# Patient Record
Sex: Female | Born: 1940 | Race: White | Hispanic: No | Marital: Married | Smoking: Never smoker
Health system: Southern US, Community
[De-identification: ages and names within clinical notes are randomized; demographics above are authoritative.]

## PROBLEM LIST (undated history)

## (undated) DIAGNOSIS — E785 Hyperlipidemia, unspecified: Secondary | ICD-10-CM

## (undated) DIAGNOSIS — M1990 Unspecified osteoarthritis, unspecified site: Secondary | ICD-10-CM

## (undated) DIAGNOSIS — E119 Type 2 diabetes mellitus without complications: Secondary | ICD-10-CM

## (undated) DIAGNOSIS — R112 Nausea with vomiting, unspecified: Secondary | ICD-10-CM

## (undated) DIAGNOSIS — A419 Sepsis, unspecified organism: Secondary | ICD-10-CM

## (undated) DIAGNOSIS — I4891 Unspecified atrial fibrillation: Secondary | ICD-10-CM

## (undated) DIAGNOSIS — Z9889 Other specified postprocedural states: Secondary | ICD-10-CM

## (undated) DIAGNOSIS — I10 Essential (primary) hypertension: Secondary | ICD-10-CM

## (undated) DIAGNOSIS — Z87442 Personal history of urinary calculi: Secondary | ICD-10-CM

## (undated) HISTORY — PX: TONSILLECTOMY: SUR1361

## (undated) HISTORY — PX: CHOLECYSTECTOMY: SHX55

## (undated) HISTORY — PX: SALIVARY STONE REMOVAL: SHX5213

## (undated) HISTORY — PX: ABDOMINAL HYSTERECTOMY: SHX81

## (undated) HISTORY — PX: CATARACT EXTRACTION W/ INTRAOCULAR LENS  IMPLANT, BILATERAL: SHX1307

## (undated) HISTORY — PX: BREAST SURGERY: SHX581

---

## 2006-05-17 ENCOUNTER — Inpatient Hospital Stay: Payer: Self-pay | Admitting: Internal Medicine

## 2006-05-17 ENCOUNTER — Other Ambulatory Visit: Payer: Self-pay

## 2006-05-24 ENCOUNTER — Other Ambulatory Visit: Payer: Self-pay

## 2006-07-20 ENCOUNTER — Ambulatory Visit: Payer: Self-pay | Admitting: Vascular Surgery

## 2010-02-01 ENCOUNTER — Emergency Department: Payer: Self-pay | Admitting: Emergency Medicine

## 2010-02-20 ENCOUNTER — Emergency Department: Payer: Self-pay | Admitting: Emergency Medicine

## 2010-06-08 ENCOUNTER — Emergency Department: Payer: Self-pay | Admitting: Emergency Medicine

## 2011-09-19 ENCOUNTER — Ambulatory Visit: Payer: Self-pay | Admitting: Internal Medicine

## 2011-10-18 ENCOUNTER — Emergency Department: Payer: Self-pay | Admitting: Emergency Medicine

## 2011-10-18 LAB — URINALYSIS, COMPLETE
Bacteria: NONE SEEN
Bilirubin,UR: NEGATIVE
Ketone: NEGATIVE
Ph: 7 (ref 4.5–8.0)
Protein: 100
RBC,UR: 206 /HPF (ref 0–5)
Squamous Epithelial: 1
WBC UR: 38 /HPF (ref 0–5)

## 2013-10-06 ENCOUNTER — Inpatient Hospital Stay: Payer: Self-pay | Admitting: Internal Medicine

## 2013-10-06 ENCOUNTER — Ambulatory Visit: Payer: Self-pay | Admitting: Oncology

## 2013-10-06 LAB — COMPREHENSIVE METABOLIC PANEL
Albumin: 3.3 g/dL — ABNORMAL LOW (ref 3.4–5.0)
Alkaline Phosphatase: 100 U/L
Anion Gap: 22 — ABNORMAL HIGH (ref 7–16)
BUN: 31 mg/dL — ABNORMAL HIGH (ref 7–18)
Bilirubin,Total: 0.4 mg/dL (ref 0.2–1.0)
CHLORIDE: 96 mmol/L — AB (ref 98–107)
CREATININE: 1.12 mg/dL (ref 0.60–1.30)
Calcium, Total: 9.5 mg/dL (ref 8.5–10.1)
Co2: 14 mmol/L — ABNORMAL LOW (ref 21–32)
GFR CALC AF AMER: 56 — AB
GFR CALC NON AF AMER: 48 — AB
Glucose: 253 mg/dL — ABNORMAL HIGH (ref 65–99)
Osmolality: 280 (ref 275–301)
Potassium: 3.2 mmol/L — ABNORMAL LOW (ref 3.5–5.1)
SGOT(AST): 20 U/L (ref 15–37)
SGPT (ALT): 19 U/L (ref 12–78)
Sodium: 132 mmol/L — ABNORMAL LOW (ref 136–145)
Total Protein: 8.4 g/dL — ABNORMAL HIGH (ref 6.4–8.2)

## 2013-10-06 LAB — CBC WITH DIFFERENTIAL/PLATELET
BASOS ABS: 0.1 10*3/uL (ref 0.0–0.1)
Basophil %: 0.4 %
EOS PCT: 0 %
Eosinophil #: 0 10*3/uL (ref 0.0–0.7)
HCT: 38.9 % (ref 35.0–47.0)
HGB: 12.2 g/dL (ref 12.0–16.0)
LYMPHS PCT: 9.2 %
Lymphocyte #: 2.6 10*3/uL (ref 1.0–3.6)
MCH: 27.8 pg (ref 26.0–34.0)
MCHC: 31.4 g/dL — AB (ref 32.0–36.0)
MCV: 89 fL (ref 80–100)
MONO ABS: 0.7 x10 3/mm (ref 0.2–0.9)
Monocyte %: 2.6 %
Neutrophil #: 25 10*3/uL — ABNORMAL HIGH (ref 1.4–6.5)
Neutrophil %: 87.8 %
PLATELETS: 575 10*3/uL — AB (ref 150–440)
RBC: 4.38 10*6/uL (ref 3.80–5.20)
RDW: 14 % (ref 11.5–14.5)
WBC: 28.5 10*3/uL — ABNORMAL HIGH (ref 3.6–11.0)

## 2013-10-06 LAB — HEMOGLOBIN A1C: Hemoglobin A1C: 7.8 % — ABNORMAL HIGH (ref 4.2–6.3)

## 2013-10-06 LAB — MAGNESIUM: Magnesium: 1.5 mg/dL — ABNORMAL LOW

## 2013-10-07 LAB — BASIC METABOLIC PANEL
ANION GAP: 16 (ref 7–16)
Anion Gap: 10 (ref 7–16)
BUN: 43 mg/dL — ABNORMAL HIGH (ref 7–18)
BUN: 40 mg/dL — ABNORMAL HIGH (ref 7–18)
Calcium, Total: 8.5 mg/dL (ref 8.5–10.1)
Calcium, Total: 8.3 mg/dL — ABNORMAL LOW (ref 8.5–10.1)
Chloride: 99 mmol/L (ref 98–107)
Chloride: 101 mmol/L (ref 98–107)
Co2: 21 mmol/L (ref 21–32)
Co2: 25 mmol/L (ref 21–32)
Creatinine: 1.44 mg/dL — ABNORMAL HIGH (ref 0.60–1.30)
Creatinine: 1.22 mg/dL (ref 0.60–1.30)
EGFR (African American): 41 — ABNORMAL LOW
EGFR (Non-African Amer.): 36 — ABNORMAL LOW
EGFR (Non-African Amer.): 44 — ABNORMAL LOW
GFR CALC AF AMER: 51 — AB
Glucose: 222 mg/dL — ABNORMAL HIGH (ref 65–99)
Glucose: 194 mg/dL — ABNORMAL HIGH (ref 65–99)
OSMOLALITY: 290 (ref 275–301)
OSMOLALITY: 287 (ref 275–301)
Potassium: 3 mmol/L — ABNORMAL LOW (ref 3.5–5.1)
Potassium: 2.9 mmol/L — ABNORMAL LOW (ref 3.5–5.1)
Sodium: 136 mmol/L (ref 136–145)
Sodium: 136 mmol/L (ref 136–145)

## 2013-10-07 LAB — CBC WITH DIFFERENTIAL/PLATELET
Basophil #: 0 10*3/uL (ref 0.0–0.1)
Basophil %: 0.3 %
EOS PCT: 0.3 %
Eosinophil #: 0 10*3/uL (ref 0.0–0.7)
HCT: 29.4 % — AB (ref 35.0–47.0)
HGB: 9.5 g/dL — ABNORMAL LOW (ref 12.0–16.0)
Lymphocyte #: 2 10*3/uL (ref 1.0–3.6)
Lymphocyte %: 15.7 %
MCH: 28.1 pg (ref 26.0–34.0)
MCHC: 32.2 g/dL (ref 32.0–36.0)
MCV: 87 fL (ref 80–100)
Monocyte #: 0.5 x10 3/mm (ref 0.2–0.9)
Monocyte %: 4.1 %
NEUTROS PCT: 79.6 %
Neutrophil #: 10.3 10*3/uL — ABNORMAL HIGH (ref 1.4–6.5)
Platelet: 370 10*3/uL (ref 150–440)
RBC: 3.37 10*6/uL — ABNORMAL LOW (ref 3.80–5.20)
RDW: 14 % (ref 11.5–14.5)
WBC: 12.9 10*3/uL — AB (ref 3.6–11.0)

## 2013-10-07 LAB — VANCOMYCIN, TROUGH: Vancomycin, Trough: 10 ug/mL (ref 10–20)

## 2013-10-08 LAB — CBC WITH DIFFERENTIAL/PLATELET
Basophil #: 0 10*3/uL (ref 0.0–0.1)
Basophil %: 0.6 %
EOS PCT: 1.5 %
Eosinophil #: 0.1 10*3/uL (ref 0.0–0.7)
HCT: 24.3 % — AB (ref 35.0–47.0)
HGB: 8.1 g/dL — AB (ref 12.0–16.0)
LYMPHS PCT: 37 %
Lymphocyte #: 2.6 10*3/uL (ref 1.0–3.6)
MCH: 29.2 pg (ref 26.0–34.0)
MCHC: 33.4 g/dL (ref 32.0–36.0)
MCV: 87 fL (ref 80–100)
Monocyte #: 0.6 x10 3/mm (ref 0.2–0.9)
Monocyte %: 8.7 %
NEUTROS PCT: 52.2 %
Neutrophil #: 3.7 10*3/uL (ref 1.4–6.5)
Platelet: 301 10*3/uL (ref 150–440)
RBC: 2.77 10*6/uL — AB (ref 3.80–5.20)
RDW: 14.3 % (ref 11.5–14.5)
WBC: 7.1 10*3/uL (ref 3.6–11.0)

## 2013-10-08 LAB — BASIC METABOLIC PANEL
Anion Gap: 7 (ref 7–16)
BUN: 28 mg/dL — ABNORMAL HIGH (ref 7–18)
Calcium, Total: 7.9 mg/dL — ABNORMAL LOW (ref 8.5–10.1)
Chloride: 106 mmol/L (ref 98–107)
Co2: 26 mmol/L (ref 21–32)
Creatinine: 0.89 mg/dL (ref 0.60–1.30)
EGFR (African American): >60
GLUCOSE: 196 mg/dL — AB (ref 65–99)
Osmolality: 288 (ref 275–301)
Potassium: 3.8 mmol/L (ref 3.5–5.1)
SODIUM: 139 mmol/L (ref 136–145)

## 2013-10-09 LAB — VANCOMYCIN, TROUGH: VANCOMYCIN, TROUGH: 9 ug/mL — AB (ref 10–20)

## 2013-10-09 LAB — OCCULT BLOOD X 1 CARD TO LAB, STOOL: OCCULT BLOOD, FECES: POSITIVE

## 2013-10-09 LAB — CLOSTRIDIUM DIFFICILE(ARMC)

## 2013-10-10 LAB — CREATININE, SERUM
CREATININE: 0.97 mg/dL (ref 0.60–1.30)
EGFR (African American): >60
EGFR (Non-African Amer.): 58 — ABNORMAL LOW

## 2013-10-10 LAB — CBC WITH DIFFERENTIAL/PLATELET
BASOS ABS: 0.1 10*3/uL (ref 0.0–0.1)
BASOS PCT: 0.7 %
Eosinophil #: 0.2 10*3/uL (ref 0.0–0.7)
Eosinophil %: 2.2 %
HCT: 26.2 % — AB (ref 35.0–47.0)
HGB: 8.6 g/dL — ABNORMAL LOW (ref 12.0–16.0)
LYMPHS ABS: 3.1 10*3/uL (ref 1.0–3.6)
Lymphocyte %: 43.3 %
MCH: 28.7 pg (ref 26.0–34.0)
MCHC: 32.7 g/dL (ref 32.0–36.0)
MCV: 88 fL (ref 80–100)
MONO ABS: 0.4 x10 3/mm (ref 0.2–0.9)
MONOS PCT: 6.2 %
Neutrophil #: 3.4 10*3/uL (ref 1.4–6.5)
Neutrophil %: 47.6 %
Platelet: 294 10*3/uL (ref 150–440)
RBC: 2.99 10*6/uL — AB (ref 3.80–5.20)
RDW: 13.9 % (ref 11.5–14.5)
WBC: 7.1 10*3/uL (ref 3.6–11.0)

## 2013-10-10 LAB — WOUND CULTURE

## 2013-10-11 LAB — CULTURE, BLOOD (SINGLE)

## 2013-11-05 ENCOUNTER — Ambulatory Visit: Payer: Self-pay | Admitting: Oncology

## 2013-11-20 DIAGNOSIS — K115 Sialolithiasis: Secondary | ICD-10-CM | POA: Insufficient documentation

## 2014-08-29 NOTE — H&P (Signed)
PATIENT NAME:  Tiffany Manning, Tiffany Manning MR#:  [id number] DATE OF BIRTH:  1941/01/19  DATE OF ADMISSION:  10/06/2013  PRIMARY CARE PHYSICIAN: Dr. Dewaine Oats  REFERRING PHYSICIAN: Dr. Bayard Males   CHIEF COMPLAINT: Right-sided neck pain.   HISTORY OF PRESENT ILLNESS: Tiffany Manning is a 74 year old pleasant white female with past medical history of hypertension, hyperlipidemia, diabetes mellitus. Had 2 lower teeth. Started to notice swelling of the face. Concerning this went to her dentist who felt this was coming from her teeth. The patient was referred to oral surgeon who gave her antibiotics, amoxicillin. The patient took for a week and the swelling did not improve. Concerning this went back to her oral surgeon whole extracted both her lower teeth and gave her 3 more weeks of amoxicillin. The patient continues to have swelling, increase in duration of the submandibular area. The patient was experiencing severe pain, was taking Tylenol every 3 hours. She did not spike any fever. As her symptoms were now getting worse, she came to the Emergency Department. On work-up in the Emergency Department, the patient was found to have WBC count of 21,000. CT neck was done which showed 6 x 6 x 6 cm of large abscess between the submandibular and cricoid area. Consulted ENT, Dr. Chestine Spore, who did the I and D of the abscess. The patient received 1 dose of Zosyn. The patient was recently treated with amoxicillin. The patient states had a small rash on her feet after taking for 10 days. The patient states has tolerated penicillins all her life without any problems. Denies having any difficulty swallowing. There was an incidental finding of 11 mm nodule in the left upper lobe. However, commented as worrisome for primary lung malignancy. The patient has never smoked.   PAST MEDICAL HISTORY: 1.  Hypertension.  2.  Diabetes mellitus. 3.  Hyperlipidemia.  4.  Recurrent urinary tract infections.   PAST SURGICAL HISTORY: Hysterectomy.    ALLERGIES:  1.  SULFA. 2.  DARVON.  3.  AMOXICILLIN caused small Rash, however, the patient tolerated penicillins well in the past.   HOME MEDICATIONS: 1.  Simvastatin 40 mg once a day.  2.  Triamterene/hydrochlorothiazide 1 capsule once a day.  3.  Glipizide/metformin 5/500 mg 1 tablet 2 times a day.  4.  Doxycycline 1 tablet 2 times a day.  5.  Atenolol 50 mg once a day.   SOCIAL HISTORY: The patient never smoked, drank alcohol, or abused illicit drugs. Married. Lives with her husband.   FAMILY HISTORY: Diabetes mellitus.   REVIEW OF SYSTEMS: CONSTITUTIONAL: Has been experiencing generalized weakness.  EYES: No change in vision.  ENT: Has swelling of the neck.  RESPIRATORY: No cough, shortness of breath.  GASTROINTESTINAL: Has been nauseated and vomited. Denies any diarrhea.  GENITOURINARY: No dysuria or hematuria.  SKIN: No rash or lesions.  ENDOCRINE: Has diagnosis of diabetes mellitus.  HEMATOLOGIC: No easy bruising or bleeding.  MUSCULOSKELETAL: No joint pains or aches.  NEUROLOGIC: No weakness or numbness in any part of the body.   PHYSICAL EXAMINATION: GENERAL: This is a well-built, well-nourished age-appropriate female lying down in the bed, not in distress.  VITAL SIGNS: Temperature 98.4, pulse 83, blood pressure 119/57, respiratory rate 18, oxygen saturation 100% on room air.  HEENT: Head normocephalic, atraumatic. There is no scleral icterus. Conjunctivae normal. Pupils equal and react to light. Extraocular movements are intact. Mucous membranes dry. No pharyngeal erythema.  NECK: Has large indurated abscess in the submandibular area. Could not appreciate any  lymphadenopathy on either side. No carotid bruit. CHEST: Has no focal tenderness. Lungs bilaterally clear to auscultation.  HEART: S1, S2 regular. No murmurs are heard.  ABDOMEN: Bowel sounds present. Soft, nontender, nondistended. No hepatosplenomegaly.  EXTREMITIES: No pedal edema. Pulses 2+.  SKIN: No  rash or lesions.  MUSCULOSKELETAL: No joint pains and aches.  NEUROLOGIC: The patient is alert and oriented to place, person, and time. Cranial nerves II through XII intact. Motor 5/5 in upper and lower extremities.   DIAGNOSTIC DATA: Laboratory: CBC: WBC 28,000, hemoglobin 12, platelet count 575,000,  neutrophils 87%.   CMP: Glucose 253, BUN 51, creatinine 1.12, potassium 3.2, sodium 132, and bicarb 14.   ASSESSMENT AND PLAN: Tiffany Manning is a 74 year old female who comes with sepsis secondary to large neck abscess.  1.  Sepsis. Follow up with blood cultures as well as cultures from the incision and drainage. The patient is currently on vancomycin and Zosyn. De-escalate the antibiotics once culture data is available.  2.  Neck abscess, most likely from the dental infection. The abscess has been incised and drained.  Continue the vancomycin and Zosyn. Follow up with the cultures.  3.  Diabetes mellitus. We will hold the metformin for now as the patient received contrast for CT. Continue the glipizide and sliding scale insulin.  4.  Hypertension. Hold hydrochlorothiazide as the patient is dehydrated. Continue the lisinopril.  5.  Dehydration. Continue with IV fluids secondary to nausea and vomiting.  6.  Hypokalemia. Will replace by mouth.  7.  Hyponatremia, most likely secondary to nausea and vomiting. We will continue to follow up after the patient is  given IV fluids.  8.  Keep the patient on deep vein thrombosis prophylaxis with Lovenox.   TIME SPENT: 55 minutes.  ____________________________ Tiffany GriffinsPadmaja Corinn Stoltzfus, Tiffany Manning pv:sb D: 10/06/2013 07:02:17 ET T: 10/06/2013 08:21:19 ET JOB#: [id number]  cc: Tiffany GriffinsPadmaja Lukis Bunt, Tiffany Manning, <Dictator> Jillene Bucksenny C. Arlana Pouchate, Tiffany Manning Clerance LavPADMAJA Osborne Serio Tiffany Manning ELECTRONICALLY SIGNED 10/06/2013 23:24

## 2014-08-29 NOTE — Consult Note (Signed)
Pt seen and examined. Full consult to follow. Pt admitted with large neck abscess along with bouts of nausea, vomiting, and some dysphagia. After abscess drained, nauea/vomiting, and dysphagia resolved. Diarrhea x 1 day. Normally, BM's regular. With hydration, drop in hgb. Stool neg for c.diff but positive for blood. 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 No prior colonoscopy. Overall feels ok. Pt hoping to be discharged today. Ok for discharge from GI point of view unless there is active bleeding, which patient does not have. Have patient f/u in our office. WIll need to schedule outpt colonoscopy and possibly repeat EGD as well. WIll sign off. Thanks.  Electronic Signatures: Lutricia Feilh, Kaide Gage (MD)  (Signed on 05-Jun-15 07:59)  Authored  Last Updated: 05-Jun-15 07:59 by Lutricia Feilh, Christionna Poland (MD)

## 2014-08-29 NOTE — Consult Note (Signed)
History of Present Illness:  Reason for Consult 11 mm lesion incidentally found on CT scan.   HPI   Patient is a 74 year old female who was admitted to the hospital a chief complaint of right-sided neck pain. Subsequent workup which included a CT scan of the neck revealed a 6 x 6 x 6 cm abscess just near her submandibular. Dr. Carlis Abbott did an I&D of the abscess now patient is currently receiving antibiotics. Incidentally there was an 11 mm nodule found in the left upper lobe of her lung. Currently, patient continues to feel terrible but is improved since admission. She has no neurologic complaints. She denies any fevers. She denies any chest pain, cough, shortness of breath, or hemoptysis. She has no nausea, vomiting, constipation, or diarrhea. She has no melena or hematochezia. She has no urinary complaints. Patient otherwise feels well and offers no further specific complaints.  PFSH:  Additional Past Medical and Surgical History hypertension, diabetes, hyperlipidemia, recurrent UTI, total hysterectomy.  Family history: Diabetes.  Social history: Patient is a never smoker, denies alcohol.   Review of Systems:  Performance Status (ECOG) 0   Review of Systems   As per HPI. Otherwise, 10 point system review was negative.   NURSING NOTES: **Vital Signs.:   02-Jun-15 16:04   Vital Signs Type: Q 8hr   Temperature Temperature (F): 98.6   Celsius: 37   Temperature Source: oral   Pulse Pulse: 67   Respirations Respirations: 18   Systolic BP Systolic BP: 573   Diastolic BP (mmHg) Diastolic BP (mmHg): 71   Mean BP: 87   Pulse Ox % Pulse Ox %: 97   Pulse Ox Activity Level: At rest   Oxygen Delivery: Room Air/ 21 %   Telemetry pattern Cardiac Rhythm: Normal sinus rhythm; pattern reported by Telemetry Clerk   Physical Exam:  Physical Exam General: Well-developed, well-nourished, no acute distress. Eyes: Pink conjunctiva, anicteric sclera. HEENT: surgical dressing  noted. Lungs: Clear to auscultation bilaterally. Heart: Regular rate and rhythm. No rubs, murmurs, or gallops. Abdomen: Soft, nontender, nondistended. No organomegaly noted, normoactive bowel sounds. Musculoskeletal: No edema, cyanosis, or clubbing. Neuro: Alert, answering all questions appropriately. Cranial nerves grossly intact. Skin: No rashes or petechiae noted. Psych: Normal affect.    Sulfa drugs: N/V  Darvon: N/V  Amoxicillin: Rash    glipiZIDE-metFORMIN 5 mg-500 mg oral tablet: 1 tab(s) orally 2 times a day, Status: Active, Quantity: 0, Refills: None   hydrochlorothiazide-triamterene 25 mg-37.5 mg oral capsule: 1 cap(s) orally once a day, Status: Active, Quantity: 0, Refills: None   atenolol 50 mg oral tablet: 1 tab(s) orally once a day, Status: Active, Quantity: 0, Refills: None   simvastatin 40 mg oral tablet: 1 tab(s) orally once a day (at bedtime), Status: Active, Quantity: 0, Refills: None   doxycycline hyclate hyclate 100 mg oral delayed release tablet: 1 tab(s) orally 2 times a day, Status: Active, Quantity: 0, Refills: None  Laboratory Results: Routine Chem:  02-Jun-15 04:56   Glucose, Serum  222  BUN  43  Creatinine (comp)  1.44  Sodium, Serum 136  Potassium, Serum  3.0  Chloride, Serum 99  CO2, Serum 21  Calcium (Total), Serum 8.5  Anion Gap 16  Osmolality (calc) 290  eGFR (African American)  41  eGFR (Non-African American)  36 (eGFR values <59m/min/1.73 m2 may be an indication of chronic kidney disease (CKD). Calculated eGFR is useful in patients with stable renal function. The eGFR calculation will not be reliable in acutely ill  patients when serum creatinine is changing rapidly. It is not useful in  patients on dialysis. The eGFR calculation may not be applicable to patients at the low and high extremes of body sizes, pregnant women, and vegetarians.)  Routine Hem:  02-Jun-15 04:56   WBC (CBC)  12.9  RBC (CBC)  3.37  Hemoglobin (CBC)  9.5   Hematocrit (CBC)  29.4  Platelet Count (CBC) 370  MCV 87  MCH 28.1  MCHC 32.2  RDW 14.0  Neutrophil % 79.6  Lymphocyte % 15.7  Monocyte % 4.1  Eosinophil % 0.3  Basophil % 0.3  Neutrophil #  10.3  Lymphocyte # 2.0  Monocyte # 0.5  Eosinophil # 0.0  Basophil # 0.0 (Result(s) reported on 07 Oct 2013 at 05:34AM.)   Assessment and Plan: Impression:   11 mm lesion incidentally found on CT scan. Plan:   1. Incidental lung lesion: Given a nonsmoker, this is unlikely a malignancy but needs to be monitored closely. No intervention is needed at this time. Plan to schedule patient for a repeat CT scan in 3 months to assess for interval change. She will then followup in the [facility] 1-2 days later for discussion of her results. Patient and her husband expressed understanding and were in agreement with this plan.  Electronic Signatures: Delight Hoh (MD)  (Signed 02-Jun-15 17:31)  Authored: HISTORY OF PRESENT ILLNESS, PFSH, ROS, NURSING NOTES, PE, ALLERGIES, HOME MEDICATIONS, LABS, ASSESSMENT AND PLAN   Last Updated: 02-Jun-15 17:31 by Delight Hoh (MD)

## 2014-08-29 NOTE — Discharge Summary (Signed)
PATIENT NAME:  Tiffany Manning, RAMSTAD MR#:  [id number] DATE OF BIRTH:  February 28, 1941  DATE OF ADMISSION:  10/06/2013 DATE OF DISCHARGE:  10/10/2013  ADMITTING DIAGNOSES:   1.  Sepsis.  2.  Neck abscess.  3.  Diabetes mellitus.  4.  Hypertension.  5.  Dehydration.  6.  Hypokalemia.  7.  Hyponatremia.  DISCHARGE DIAGNOSES:   1.  Systemic inflammatory response syndrome. 2.  Right submandibular gland duct stone with resulting right neck abscess, status post incision and drainage of abscess by Dr. Chestine Spore on 10/06/2013.  3.  Hyponatremia.  4.  Hypokalemia.  5.  Dehydration.  6.  Acute renal failure.  7.  Hypotension, resolved with intravenous fluid administration.  8.  History of diabetes mellitus, with hemoglobin A1c 7.8.  9.  Diarrhea, resolved.  10.  Anemia, with rehydration guaiac-positive, EGD as well as colonoscopy to be performed as outpatient by Dr. Bluford Kaufmann.  11.  An 11 mm left upper lobe lobulated mass concerning for primary lung malignancy.  Work-up to be continued and three month follow-up CT scan of the chest is recommended.  DISCHARGE CONDITION:  Stable.   DISCHARGE MEDICATIONS:  The patient is to continue:  1.  Glipizide, metformin 5/500, 1 tablet twice daily.  2.  Hydrochlorothiazide/triamterene 25/37.5 mg once daily.  3.  Atenolol 50 mg by mouth daily.  4.  Simvastatin 40 mg by mouth at bedtime.  5.  Doxycycline 100 mg twice daily for 10 days.  6.  Senna 1 tablet twice daily as needed.  7.  Norco 325/5 mg 1 tablet every four hours as needed.  8.  Promethazine 25 mg every six hours as needed.  9.  Magnesium oxide 400 mg by mouth once daily.  10.  Lactobacillus acidophilus 1 capsule twice daily.   HOME OXYGEN:  None.   DIET:  2 gram low-salt, low-fat, low-cholesterol, carbohydrate-controlled diet, mechanical soft.   ACTIVITY LIMITATIONS:  As tolerated.   FOLLOWUP APPOINTMENT:  With Dr. Demetrios Loll at [facility] in one week after discharge.    CONSULTANTS:  Dr. Chestine Spore, Dr. Bluford Kaufmann, care  management, social work, Dr. Orlie Dakin.     HOSPITAL COURSE:  The patient is a 74 year old Caucasian female with past medical history significant for history of diabetes, hypertension, hyperlipidemia, who presents to the hospital with complaints of right-sided neck pains.  Please refer to Dr. Clarita Leber admission note on 10/06/2013.  On arrival to the hospital the patient was complaining of significant pain.  She had no fevers.  She was apparently referred to oral surgeon who extracted both lower teeth and continued the patient on antibiotic therapy.  On arrival to the Emergency Room, she was noted to have elevated white blood cell count and hospitalist services were contacted, but also ENT physician Dr. Chestine Spore was consulted.  The patient had a CT scan of the neck done on 10/06/2013 which revealed a large complex cystic mass in the right neck extending from the low right submandibular gland to the level of cricoid cartilage.  This measures 5.9 cm in greatest dimension.  It did not appear to have a dental origin, although a large dental abscess was possible.  It did have an inflammatory appearance.  It may be an abscess developing from the right submandibular gland infection.  There is a stone noted in the inferior medial aspect of right submandibular gland which may reside in the submandibular duct.  Large complex cystic mass could be neoplastic origin according to radiologist as well.  No enlarged neck lymph  nodes were noted.  An 11 mm left upper lobe lobulated mass worrisome for primary lung malignancy was noted.  Small nodule in the right upper lobe may be benign, but could reflect metastatic disease.  Consider follow-up CT scan according to radiologist.  Because of these abnormalities, the patient was admitted to the hospital, started on broad-spectrum antibiotic therapy.  Consultation with Dr. Gertie BaronMadison Clark was obtained on 10/06/2013.  The patient was seen by Dr. Chestine Sporelark and underwent incision and drainage of  right neck abscess.  A 10 blade was used to make an incision in the skin, copious pus was evacuated from the wound and samples with cultures swabs for aerobic as well as anaerobic bacterial culture.  The wound was copiously irrigated with saline approximately 500 mL until all of the accessible purulence was completely removed.  The patient was given additional supplemental 4 mg of morphine for pain control and the wound was packed with Betadine soaked half inch of iodoform gauze.  The patient placed the dressing of 4 x 4 and loose Kerlix.  The patient tolerated the procedure well and felt immediate relief from the procedure itself.  The patient has been followed by Dr. Chestine Sporelark while she was in the hospital.  Dr. Chestine Sporelark saw the patient in consultation last time on the day of discharge, 10/10/2013.  A small amount of dried crust was noted in the I and D site, but no palpable reaccumulation of the fluid.  He discussed the case [facility]  He kindly agreed [facility] and arrange for surgical excission of the right submandibular gland.  The patient was recommended to continue on antibiotics until surgery to remove right submandibular gland.  This was discussed with the patient at length and she was agreeable.  The patient is being discharged to home today on 10/10/2013 in stable condition.    Her vital signs on the day of discharge, temperature was 98.7, pulse was 65, respiration rate was 18, blood pressure ranging from 160 to 186, this was before medication use and diastolic blood pressure was 80s to 90s, O2 sats were 96% to 97% on room air at rest.   In regards to right upper lobe mass, the patient was consulted by Dr. Orlie DakinFinnegan, oncologist, who felt that the patient's incidental lung lesion, given the patient is a nonsmoker, is unlikely malignancy.  However, this needs to be monitored closely, however no intervention was recommended at this time.  He  recommended to schedule the patient for repeated CT scan in three months.  This is for interval change and then follow up with [facility] in the next 1 to 2 days after discharge to discuss her results.   In regards to hyponatremia as well as hypokalemia and dehydration which the patient was noted to be during arrival to the hospital, those resolved with IV fluid administration.  The patient was slowly rehydrated and her sodium level, potassium level as well as magnesium level which was found to be low were replenished and normalized.  The patient was recommended to resume her blood pressure medications upon discharge.  Initially while in the hospital, the patient's blood pressure medications were placed on hold due to hypotension.   In regards to diabetes mellitus, hemoglobin A1c was checked, was found to be 7.8.  The patient was recommended to continue her outpatient management.  The patient was noted to have diarrhea.  It was felt that the patient's  diarrhea could have been related to magnesium oxide she was given orally, however C. diff stool was checked since the patient has been on antibiotic therapy for a prolonged period of time and C. diff was negative.  The patient was given Lactobacillus acidophilus to take home and follow up with primary care physician.   The patient was noted to be guaiac-positive while in the hospital.  She was evaluated and recommended to undergo colonoscopy as well as EGD as outpatient.  The patient is to follow up with Dr. Bluford Kaufmann in the next one week after discharge for further recommendations and possibly colonoscopy as well as EGD whenever she heals from her right neck area abscess.  The patient is being discharged in stable condition with the above-mentioned medications and follow-up.   TIME SPENT:  40 minutes.    ____________________________ Katharina Caper, MD rv:ea D: 10/10/2013 16:43:42 ET T: 10/10/2013 22:56:10 ET JOB#: [id number]  cc: Katharina Caper, MD,  <Dictator> Dr. Demetrios Loll Vy Badley MD ELECTRONICALLY SIGNED 10/29/2013 14:42

## 2014-08-29 NOTE — Consult Note (Signed)
PATIENT NAME:  Tiffany Manning, Tiffany Manning MR#:  [id number] DATE OF BIRTH:  April 06, 1941  DATE OF ADMISSION: 10/06/2013  DATE OF CONSULTATION:  10/10/2013  CONSULTING PHYSICIAN:  Ezzard StandingPaul Y. Tatjana Turcott, MD  REASON FOR REFERRAL: Drop in hemoglobin, heme-positive stool.    DESCRIPTION: The patient is a 74 year old white female who has a history of hypertension, hyperlipidemia and diabetes, who presented to the hospital on June 1st with significant right-sided neck pain and swelling as well as some bouts of nausea and vomiting. In the Emergency Room, she had a white count of 21,000, and CT scan of the neck showed a 6 x 6 x 6 cm abscess in the right submandibular area. ENT was consulted, who did an I and D of the abscess. The patient was also given some antibiotics as well. After the patient's abscess was drained, the nausea, vomiting and some dysphagia that she was experiencing due to the abscess resolved.   I was asked to see the patient due to 1-day history of diarrhea, drop in hemoglobin with hydration. Stool was negative for C. difficile. However, it did have evidence of blood in the stool. The patient denies seeing any gross hematochezia or melena. Prior to the episode of diarrhea, she had regular bowel movements. She denies having currently any abdominal pain or cramping. There is no nausea, vomiting, heartburn or indigestion at this time.   The patient had an upper endoscopy in January 2008 with duodenal ulcer with visible vessel that was treated. The patient denied ever having a colonoscopy. There is no family history of colon cancer, polyps or colitis.   OTHER PAST MEDICAL HISTORY: Includes diabetes, hypertension, hyperlipidemia and recurrent urinary tract infections.   PAST SURGICAL HISTORY: Includes hysterectomy.   ALLERGIES: SULFA, AMOXICILLIN AND DARVON.   HOME MEDICATIONS: Include Zocor, triamterene/hydrochlorothiazide, glipizide/metformin, doxycycline and atenolol.   SOCIAL HISTORY: There is no smoking of  tobacco.   FAMILY HISTORY: Diabetes.   REVIEW OF SYMPTOMS: There was some generalized weakness, but no real fevers or chills. The swelling in the neck has gone down significantly. There is no chest pain, palpitations, coughing or shortness of breath. She did have some nausea and vomiting which have since stopped. Her diarrhea only started in the hospital. The rest of the review of symptoms is negative.   PHYSICAL EXAMINATION:  GENERAL: The patient is in no acute distress right now.  VITAL SIGNS: She is afebrile. Her vital signs are stable.  HEAD AND NECK: Showed normocephalic, atraumatic head. Pupils are equally reactive. Throat was clear. Neck was supple. Much of the swelling on her right neck has pretty much resolved.  CARDIAC: Revealed regular rhythm and rate.  LUNGS: Clear bilaterally.  ABDOMEN: Showed normoactive bowel sounds, soft and nontender. She had active bowel sounds. There is no hepatomegaly.  EXTREMITIES: Show no clubbing, cyanosis or edema.  NEUROLOGIC: Negative.  SKIN: Negative.   LABORATORY DATA: In terms of labs, white count was 12.9, hemoglobin 9.5 on June 2nd. On June 3rd, white count was normal at 7.1, but hemoglobin dropped to 8.1. C. difficile was negative. Stool was heme-positive.   ASSESSMENT: This is a patient with right neck abscess, who is on antibiotics, which may have contributed to some diarrhea. The stool was negative. The hemoglobin has dropped to 8.1, probably related to hydration. Overall, the patient is doing well.   PLAN: Will see what her hemoglobin is today. If hemoglobin is stable, the patient would like to go home today. I would recommend that she have an outpatient  colonoscopy since there is heme-positive stool and there is no prior colonoscopy. The patient can follow up with [place] in the office as a followup. Will schedule a colonoscopy then. Because of history of duodenal ulcer, we may consider doing an upper endoscopy at the same time as the colonoscopy  to make sure there is no bleeding from upper GI tract. Will sign off at this time.   Thank you for the referral.    ____________________________ Ezzard Standing. Bluford Kaufmann, MD pyo:lb D: 10/10/2013 08:36:19 ET T: 10/10/2013 08:56:01 ET JOB#: [id number]  cc: Ezzard Standing. Bluford Kaufmann, MD, <Dictator> Ezzard Standing Colvin Blatt MD ELECTRONICALLY SIGNED 10/12/2013 17:15

## 2014-08-29 NOTE — Consult Note (Signed)
PATIENT NAME:  Tiffany Manning, Tiffany Manning MR#:  [id number] DATE OF BIRTH:  04-Jun-1940  DATE OF CONSULTATION:  10/06/2013  REFERRING PHYSICIAN: Bayard Malesandolph Brown, MD CONSULTING PHYSICIAN:  Tiffany BarefootJ. Madison Okley Magnussen, MD  REASON FOR CONSULTATION: Right neck abscess.   HISTORY OF PRESENT ILLNESS: The patient is a 74 year old white female, who presents to the Emergency Room with severe right neck pain that has gone for about a month. She began developing the right neck pain and was seen about a month ago by her dentist and an oral surgeon in [place]. She was also evaluated by Dr. Jenne CampusMcQueen and he elected to place her on amoxicillin for 3 weeks. She did not notice significant improvement and was then seen again by the oral surgeon. The oral surgeon then removed her mandibular central incisors, again with no significant relief. The right neck pain has progressively gotten worse over the past week. Her husband has tried to "stick a needle in it." A CT scan was obtained, which shows a large right neck abscess. Notably, there is also a left upper lobe mass that was identified on the neck CT.   ALLERGIES, MEDICATIONS, PAST MEDICAL HISTORY, AND PAST SURGICAL HISTORY: Reviewed and documented in the chart.   PHYSICAL EXAMINATION:  GENERAL: The patient is in notable pain, but no respiratory distress.  HEENT: There is an obvious large right neck abscess in the submandibular space. Oral cavity and oropharynx: Recent surgical changes in the central lower gingiva. No floor of mouth abscess or cellulitis. Ears: Hearing aid in the left ear. External auditory canals are clear. Nose: The nares are patent. No intranasal mucosa  inflammation or nasal purulence.  NECK: Large right submandibular space abscess. The submandibular gland itself is not palpable. There is overlying erythema in the skin with a central crust (corresponding to where her husband had attempted drainage). There is shotty adenopathy in levels 3 and 4. The trachea is midline.    PROCEDURE: Incision and drainage right neck abscess.   DESCRIPTION OF PROCEDURE: The area was locally anesthetized, prepped and draped in the usual fashion with Betadine. A 10 blade was used to make an incision in the skin. Copious pus was evacuated from the wound and sampled with culture swabs for aerobic and anaerobic bacterial culture. The wound was copiously irrigated with saline (500 mL) until all of the expressible purulence had been completely removed. The patient was given did an additional supplemental 4 mg of morphine for pain control and then the wound was packed with Betadine soaked half inch iodoform gauze. The nurse placed the dressing of 4 x 4's and loose Kerlix. The patient tolerated the procedure well and felt immediate relief from the pressure.   IMPRESSION: Right neck abscess.   RECOMMENDATIONS: The patient will be placed on broad-spectrum antibiotics and admitted to the hospital by the hospitalist service (very much appreciate that) and she will have further followup for the left upper lobe lung mass. The iodoform gauze will be removed slowly over the next couple of days by either Dr. Jenne CampusMcQueen or myself.   (addendum) I communicated with Dr. Jenne CampusMcQueen to provide details of the encounter and the patient's location.  ____________________________ Tiffany CommonsJ. Gertie BaronMadison Siya Flurry, MD jmc:aw D: 10/06/2013 08:01:26 ET T: 10/06/2013 08:16:33 ET JOB#: [id number]  cc: Tiffany BarefootJ. Madison Brinley Rosete, MD, <Dictator> Davina Pokehapman T. McQueen, MD [facility] - Practice Administrator Wendee CoppJMADISON Sargon Scouten MD ELECTRONICALLY SIGNED 10/09/2013 7:28

## 2017-03-07 ENCOUNTER — Ambulatory Visit: Payer: Self-pay | Admitting: Orthopaedic Surgery

## 2017-05-28 ENCOUNTER — Emergency Department: Payer: Medicare HMO

## 2017-05-28 ENCOUNTER — Other Ambulatory Visit: Payer: Self-pay

## 2017-05-28 ENCOUNTER — Encounter: Payer: Self-pay | Admitting: Emergency Medicine

## 2017-05-28 ENCOUNTER — Inpatient Hospital Stay
Admission: EM | Admit: 2017-05-28 | Discharge: 2017-06-06 | DRG: 682 | Disposition: A | Discharge status: Home / Self Care | Payer: Medicare HMO | Attending: Internal Medicine | Admitting: Internal Medicine

## 2017-05-28 DIAGNOSIS — E43 Unspecified severe protein-calorie malnutrition: Secondary | ICD-10-CM

## 2017-05-28 DIAGNOSIS — E876 Hypokalemia: Secondary | ICD-10-CM | POA: Diagnosis present

## 2017-05-28 DIAGNOSIS — Z79899 Other long term (current) drug therapy: Secondary | ICD-10-CM

## 2017-05-28 DIAGNOSIS — D649 Anemia, unspecified: Secondary | ICD-10-CM | POA: Diagnosis present

## 2017-05-28 DIAGNOSIS — E872 Acidosis, unspecified: Secondary | ICD-10-CM

## 2017-05-28 DIAGNOSIS — D72829 Elevated white blood cell count, unspecified: Secondary | ICD-10-CM | POA: Diagnosis present

## 2017-05-28 DIAGNOSIS — N179 Acute kidney failure, unspecified: Principal | ICD-10-CM | POA: Diagnosis present

## 2017-05-28 DIAGNOSIS — K1120 Sialoadenitis, unspecified: Secondary | ICD-10-CM | POA: Diagnosis present

## 2017-05-28 DIAGNOSIS — L0211 Cutaneous abscess of neck: Secondary | ICD-10-CM | POA: Diagnosis present

## 2017-05-28 DIAGNOSIS — R918 Other nonspecific abnormal finding of lung field: Secondary | ICD-10-CM | POA: Diagnosis present

## 2017-05-28 DIAGNOSIS — T380X5A Adverse effect of glucocorticoids and synthetic analogues, initial encounter: Secondary | ICD-10-CM | POA: Diagnosis present

## 2017-05-28 DIAGNOSIS — I10 Essential (primary) hypertension: Secondary | ICD-10-CM | POA: Diagnosis present

## 2017-05-28 DIAGNOSIS — Z6825 Body mass index (BMI) 25.0-25.9, adult: Secondary | ICD-10-CM

## 2017-05-28 DIAGNOSIS — E860 Dehydration: Secondary | ICD-10-CM | POA: Diagnosis present

## 2017-05-28 DIAGNOSIS — R197 Diarrhea, unspecified: Secondary | ICD-10-CM | POA: Diagnosis not present

## 2017-05-28 DIAGNOSIS — Z90710 Acquired absence of both cervix and uterus: Secondary | ICD-10-CM | POA: Diagnosis not present

## 2017-05-28 DIAGNOSIS — E785 Hyperlipidemia, unspecified: Secondary | ICD-10-CM | POA: Diagnosis present

## 2017-05-28 DIAGNOSIS — E1165 Type 2 diabetes mellitus with hyperglycemia: Secondary | ICD-10-CM | POA: Diagnosis present

## 2017-05-28 DIAGNOSIS — R911 Solitary pulmonary nodule: Secondary | ICD-10-CM | POA: Diagnosis present

## 2017-05-28 DIAGNOSIS — Z7984 Long term (current) use of oral hypoglycemic drugs: Secondary | ICD-10-CM | POA: Diagnosis not present

## 2017-05-28 DIAGNOSIS — Z882 Allergy status to sulfonamides status: Secondary | ICD-10-CM

## 2017-05-28 HISTORY — DX: Essential (primary) hypertension: I10

## 2017-05-28 HISTORY — DX: Type 2 diabetes mellitus without complications: E11.9

## 2017-05-28 LAB — CBC WITH DIFFERENTIAL/PLATELET
BASOS ABS: 0.1 10*3/uL (ref 0–0.1)
BASOS PCT: 0 %
EOS PCT: 0 %
Eosinophils Absolute: 0.1 10*3/uL (ref 0–0.7)
HCT: 32.5 % — ABNORMAL LOW (ref 35.0–47.0)
Hemoglobin: 10.5 g/dL — ABNORMAL LOW (ref 12.0–16.0)
Lymphocytes Relative: 10 %
Lymphs Abs: 1.7 10*3/uL (ref 1.0–3.6)
MCH: 31.1 pg (ref 26.0–34.0)
MCHC: 32.3 g/dL (ref 32.0–36.0)
MCV: 96.4 fL (ref 80.0–100.0)
MONO ABS: 0.7 10*3/uL (ref 0.2–0.9)
Monocytes Relative: 4 %
Neutro Abs: 14.2 10*3/uL — ABNORMAL HIGH (ref 1.4–6.5)
Neutrophils Relative %: 86 %
Platelets: 452 10*3/uL — ABNORMAL HIGH (ref 150–440)
RBC: 3.37 MIL/uL — ABNORMAL LOW (ref 3.80–5.20)
RDW: 14.4 % (ref 11.5–14.5)
WBC: 16.8 10*3/uL — ABNORMAL HIGH (ref 3.6–11.0)

## 2017-05-28 LAB — COMPREHENSIVE METABOLIC PANEL
ALBUMIN: 3.3 g/dL — AB (ref 3.5–5.0)
ALT: 10 U/L — ABNORMAL LOW (ref 14–54)
ANION GAP: 21 — AB (ref 5–15)
AST: 19 U/L (ref 15–41)
Alkaline Phosphatase: 81 U/L (ref 38–126)
BUN: 64 mg/dL — AB (ref 6–20)
CHLORIDE: 104 mmol/L (ref 101–111)
CO2: 10 mmol/L — ABNORMAL LOW (ref 22–32)
Calcium: 9.2 mg/dL (ref 8.9–10.3)
Creatinine, Ser: 2.09 mg/dL — ABNORMAL HIGH (ref 0.44–1.00)
GFR calc Af Amer: 25 mL/min — ABNORMAL LOW (ref >60)
GFR, EST NON AFRICAN AMERICAN: 22 mL/min — AB (ref >60)
Glucose, Bld: 225 mg/dL — ABNORMAL HIGH (ref 65–99)
Potassium: 3.4 mmol/L — ABNORMAL LOW (ref 3.5–5.1)
Sodium: 135 mmol/L (ref 135–145)
Total Bilirubin: 0.6 mg/dL (ref 0.3–1.2)
Total Protein: 8.3 g/dL — ABNORMAL HIGH (ref 6.5–8.1)

## 2017-05-28 LAB — CREATININE, SERUM
CREATININE: 1.87 mg/dL — AB (ref 0.44–1.00)
GFR calc Af Amer: 29 mL/min — ABNORMAL LOW (ref >60)
GFR, EST NON AFRICAN AMERICAN: 25 mL/min — AB (ref >60)

## 2017-05-28 LAB — GLUCOSE, CAPILLARY
Glucose-Capillary: 196 mg/dL — ABNORMAL HIGH (ref 65–99)
Glucose-Capillary: 327 mg/dL — ABNORMAL HIGH (ref 65–99)

## 2017-05-28 LAB — LACTIC ACID, PLASMA: LACTIC ACID, VENOUS: 1.2 mmol/L (ref 0.5–1.9)

## 2017-05-28 LAB — SALICYLATE LEVEL

## 2017-05-28 MED ORDER — PIPERACILLIN-TAZOBACTAM 3.375 G IVPB 30 MIN
3.3750 g | Freq: Once | INTRAVENOUS | Status: AC
  Administered 2017-05-28: 3.375 g via INTRAVENOUS
  Filled 2017-05-28: qty 50

## 2017-05-28 MED ORDER — SIMVASTATIN 20 MG PO TABS
40.0000 mg | ORAL_TABLET | Freq: Every day | ORAL | Status: DC
  Administered 2017-05-28 – 2017-06-06 (×10): 40 mg via ORAL
  Filled 2017-05-28 (×10): qty 2

## 2017-05-28 MED ORDER — DEXAMETHASONE SODIUM PHOSPHATE 10 MG/ML IJ SOLN
10.0000 mg | Freq: Two times a day (BID) | INTRAMUSCULAR | Status: DC
  Administered 2017-05-28 – 2017-05-30 (×4): 10 mg via INTRAVENOUS
  Filled 2017-05-28 (×4): qty 1

## 2017-05-28 MED ORDER — INSULIN ASPART 100 UNIT/ML ~~LOC~~ SOLN
6.0000 [IU] | Freq: Once | SUBCUTANEOUS | Status: AC
  Administered 2017-05-28: 6 [IU] via SUBCUTANEOUS

## 2017-05-28 MED ORDER — VANCOMYCIN HCL IN DEXTROSE 1-5 GM/200ML-% IV SOLN
1000.0000 mg | Freq: Once | INTRAVENOUS | Status: AC
  Administered 2017-05-28: 1000 mg via INTRAVENOUS
  Filled 2017-05-28: qty 200

## 2017-05-28 MED ORDER — HEPARIN SODIUM (PORCINE) 5000 UNIT/ML IJ SOLN
5000.0000 [IU] | Freq: Three times a day (TID) | INTRAMUSCULAR | Status: DC
  Administered 2017-05-28 – 2017-06-06 (×25): 5000 [IU] via SUBCUTANEOUS
  Filled 2017-05-28 (×25): qty 1

## 2017-05-28 MED ORDER — PIPERACILLIN-TAZOBACTAM 3.375 G IVPB
3.3750 g | Freq: Two times a day (BID) | INTRAVENOUS | Status: DC
  Administered 2017-05-29 (×2): 3.375 g via INTRAVENOUS
  Filled 2017-05-28 (×2): qty 50

## 2017-05-28 MED ORDER — SODIUM CHLORIDE 0.9 % IV SOLN
INTRAVENOUS | Status: DC
  Administered 2017-05-28 – 2017-05-29 (×4): via INTRAVENOUS

## 2017-05-28 MED ORDER — FENTANYL CITRATE (PF) 100 MCG/2ML IJ SOLN
50.0000 ug | INTRAMUSCULAR | Status: DC | PRN
  Administered 2017-05-28: 50 ug via INTRAVENOUS
  Filled 2017-05-28: qty 2

## 2017-05-28 MED ORDER — ACETAMINOPHEN 325 MG PO TABS
650.0000 mg | ORAL_TABLET | Freq: Three times a day (TID) | ORAL | Status: DC | PRN
  Administered 2017-05-30 – 2017-06-06 (×7): 650 mg via ORAL
  Filled 2017-05-28 (×8): qty 2

## 2017-05-28 MED ORDER — SODIUM CHLORIDE 0.9 % IV BOLUS (SEPSIS)
1000.0000 mL | Freq: Once | INTRAVENOUS | Status: AC
  Administered 2017-05-28: 1000 mL via INTRAVENOUS

## 2017-05-28 MED ORDER — TRAMADOL HCL 50 MG PO TABS
50.0000 mg | ORAL_TABLET | Freq: Four times a day (QID) | ORAL | Status: DC | PRN
  Administered 2017-05-30 – 2017-06-05 (×5): 50 mg via ORAL
  Filled 2017-05-28 (×5): qty 1

## 2017-05-28 MED ORDER — DEXAMETHASONE SODIUM PHOSPHATE 10 MG/ML IJ SOLN
10.0000 mg | Freq: Once | INTRAMUSCULAR | Status: AC
  Administered 2017-05-28: 10 mg via INTRAVENOUS
  Filled 2017-05-28: qty 1

## 2017-05-28 MED ORDER — DOCUSATE SODIUM 100 MG PO CAPS: 100.0000 mg | ORAL_CAPSULE | Freq: Two times a day (BID) | ORAL | Status: DC | PRN

## 2017-05-28 MED ORDER — INSULIN ASPART 100 UNIT/ML ~~LOC~~ SOLN
SUBCUTANEOUS | Status: AC
  Administered 2017-05-28: 6 [IU] via SUBCUTANEOUS
  Filled 2017-05-28: qty 1

## 2017-05-28 MED ORDER — INSULIN ASPART 100 UNIT/ML ~~LOC~~ SOLN
0.0000 [IU] | Freq: Three times a day (TID) | SUBCUTANEOUS | Status: DC
  Administered 2017-05-28: 18:00:00 2 [IU] via SUBCUTANEOUS
  Administered 2017-05-29: 13:00:00 9 [IU] via SUBCUTANEOUS
  Administered 2017-05-29: 09:00:00 7 [IU] via SUBCUTANEOUS
  Filled 2017-05-28 (×3): qty 1

## 2017-05-28 MED ORDER — VANCOMYCIN HCL IN DEXTROSE 1-5 GM/200ML-% IV SOLN: 1000.0000 mg | INTRAVENOUS | Status: DC

## 2017-05-28 NOTE — H&P (Addendum)
[facility] [place] [facility]   PATIENT NAME: Tiffany Manning    MR#:  [id number]  DATE OF BIRTH:  1940-05-26  DATE OF ADMISSION:  05/28/2017  PRIMARY CARE PHYSICIAN: Jaclyn Shaggy, MD   REQUESTING/REFERRING PHYSICIAN: Roxan Hockey  CHIEF COMPLAINT:   Chief Complaint  Patient presents with  . Facial Swelling    HISTORY OF PRESENT ILLNESS: Tiffany Manning  is a 77 y.o. female with a known history of Htn, DM- for last one week have nausea and vomiting with decreased oral intake and some pain in her left mandibular salivary gland. She went to primary care physician's office and he had called ENT doctor and suggested to go to emergency room today. She complains that today she started having increasing swelling on her upper part of the neck and her jaw. Denies any fever or chills. ER physician had done CT scan soft tissue of the neck which did not show any stone. But showed infection and localized collection. In ER physician spoke to ENT surgeon and he suggested to give IV antibiotics and steroids for now.  PAST MEDICAL HISTORY:   Past Medical History:  Diagnosis Date  . Diabetes mellitus without complication (HCC)   . Hypertension     PAST SURGICAL HISTORY:  Past Surgical History:  Procedure Laterality Date  . ABDOMINAL HYSTERECTOMY      SOCIAL HISTORY:  Social History   Tobacco Use  . Smoking status: Never Smoker  Substance Use Topics  . Alcohol use: Not on file    FAMILY HISTORY:  Family History  Problem Relation Age of Onset  . Diabetes Sister     DRUG ALLERGIES:  Allergies  Allergen Reactions  . Sulfa Antibiotics Nausea And Vomiting    REVIEW OF SYSTEMS:   CONSTITUTIONAL: No fever, fatigue or weakness.  EYES: No blurred or double vision.  EARS, NOSE, AND THROAT: No tinnitus or ear pain. Swelling on left upper neck and lower face.  RESPIRATORY: No cough, shortness of breath, wheezing or hemoptysis.  CARDIOVASCULAR: No chest pain, orthopnea,  edema.  GASTROINTESTINAL: No nausea, vomiting, diarrhea or abdominal pain.  GENITOURINARY: No dysuria, hematuria.  ENDOCRINE: No polyuria, nocturia,  HEMATOLOGY: No anemia, easy bruising or bleeding SKIN: No rash or lesion. MUSCULOSKELETAL: No joint pain or arthritis.   NEUROLOGIC: No tingling, numbness, weakness.  PSYCHIATRY: No anxiety or depression.   MEDICATIONS AT HOME:  Prior to Admission medications   Medication Sig Start Date End Date Taking? Authorizing Provider  acetaminophen (TYLENOL) 650 MG CR tablet Take 650 mg by mouth every 8 (eight) hours as needed for pain.   Yes [provider]  atenolol (TENORMIN) 50 MG tablet Take 50 mg by mouth 2 (two) times daily. 05/04/17  Yes [provider]  clindamycin (CLEOCIN) 150 MG capsule Take 150 mg by mouth every 6 (six) hours. 05/25/17  Yes [provider]  glipiZIDE-metformin (METAGLIP) 5-500 MG tablet Take 1 tablet by mouth 2 (two) times daily. 04/03/17  Yes [provider]  simvastatin (ZOCOR) 40 MG tablet Take 40 mg by mouth daily. 05/04/17  Yes [provider]  traMADol (ULTRAM) 50 MG tablet Take 50 mg by mouth every 6 (six) hours as needed for pain. 05/25/17  Yes [provider]  triamterene-hydrochlorothiazide (MAXZIDE-25) 37.5-25 MG tablet Take 1 tablet by mouth 2 (two) times daily. 05/04/17  Yes [provider]      PHYSICAL EXAMINATION:   VITAL SIGNS: Blood pressure (!) 129/53, pulse 70, temperature (!) 97.5  F (36.4 C), temperature source Oral, resp. rate 18, height 5' (1.524 m), weight 58.1 kg (128 lb), SpO2 98 %.  GENERAL:  77 y.o.-year-old patient lying in the bed with no acute distress.  EYES: Pupils equal, round, reactive to light and accommodation. No scleral icterus. Extraocular muscles intact.  HEENT: Head atraumatic, normocephalic. Oropharynx and nasopharynx clear. Left lower face near the mandibular angle with a large swelling with redness present.  NECK:   Supple, no jugular venous distention. No thyroid enlargement, no tenderness.  LUNGS: Normal breath sounds bilaterally, no wheezing, rales,rhonchi or crepitation. No use of accessory muscles of respiration.  CARDIOVASCULAR: S1, S2 normal. No murmurs, rubs, or gallops.  ABDOMEN: Soft, nontender, nondistended. Bowel sounds present. No organomegaly or mass.  EXTREMITIES: No pedal edema, cyanosis, or clubbing.  NEUROLOGIC: Cranial nerves II through XII are intact. Muscle strength 5/5 in all extremities. Sensation intact. Gait not checked.  PSYCHIATRIC: The patient is alert and oriented x 3.  SKIN: No obvious rash, lesion, or ulcer.   LABORATORY PANEL:   CBC Recent Labs  Lab 05/28/17 1200  WBC 16.8*  HGB 10.5*  HCT 32.5*  PLT 452*  MCV 96.4  MCH 31.1  MCHC 32.3  RDW 14.4  LYMPHSABS 1.7  MONOABS 0.7  EOSABS 0.1  BASOSABS 0.1   ------------------------------------------------------------------------------------------------------------------  Chemistries  Recent Labs  Lab 05/28/17 1200  NA 135  K 3.4*  CL 104  CO2 10*  GLUCOSE 225*  BUN 64*  CREATININE 2.09*  CALCIUM 9.2  AST 19  ALT 10*  ALKPHOS 81  BILITOT 0.6   ------------------------------------------------------------------------------------------------------------------ estimated creatinine clearance is 18 mL/min (A) (by C-G formula based on SCr of 2.09 mg/dL (H)). ------------------------------------------------------------------------------------------------------------------ No results for input(s): TSH, T4TOTAL, T3FREE, THYROIDAB in the last 72 hours.  Invalid input(s): FREET3   Coagulation profile No results for input(s): INR, PROTIME in the last 168 hours. ------------------------------------------------------------------------------------------------------------------- No results for input(s): DDIMER in the last 72  hours. -------------------------------------------------------------------------------------------------------------------  Cardiac Enzymes No results for input(s): CKMB, TROPONINI, MYOGLOBIN in the last 168 hours.  Invalid input(s): CK ------------------------------------------------------------------------------------------------------------------ Invalid input(s): POCBNP  ---------------------------------------------------------------------------------------------------------------  Urinalysis    Component Value Date/Time   COLORURINE Straw 10/18/2011 1616   APPEARANCEUR Cloudy 10/18/2011 1616   LABSPEC 1.009 10/18/2011 1616   PHURINE 7.0 10/18/2011 1616   GLUCOSEU Negative 10/18/2011 1616   HGBUR 3+ 10/18/2011 1616   BILIRUBINUR Negative 10/18/2011 1616   KETONESUR Negative 10/18/2011 1616   PROTEINUR 100 mg/dL 16/02/9603 5409   NITRITE Negative 10/18/2011 1616   LEUKOCYTESUR 2+ 10/18/2011 1616     RADIOLOGY: Ct Soft Tissue Neck Wo Contrast  Result Date: 05/28/2017 CLINICAL DATA:  Left submandibular swelling, redness, and firmness for 2 weeks. EXAM: CT NECK WITHOUT CONTRAST TECHNIQUE: Multidetector CT imaging of the neck was performed following the standard protocol without intravenous contrast. COMPARISON:  10/06/2013 FINDINGS: Pharynx and larynx: Symmetric pharyngeal soft tissues without evidence of mass. Unremarkable larynx. No retropharyngeal fluid collection. Salivary glands: The large complex fluid collection extending inferiorly from the right submandibular space on the prior CT is no longer present. There is a punctate calcification in the anterior right submandibular gland without evidence of acute inflammation. There is new prominent inflammatory change in the left submandibular space with suspected phlegmon extending anteriorly from and inseparable from the left submandibular gland. There may be a punctate calcification near the anteroinferior aspect of the left  submandibular gland, however no submandibular ductal stones or dilatation is seen. The left platysma is thickened, and inflammatory  reticulation extends into the overlying subcutaneous fat with associated skin thickening. No organized fluid collection is identified on this unenhanced study. The parotid glands are unremarkable. Thyroid: The subcentimeter left thyroid nodule on the prior study is not as well seen on the current examination and may have decreased in size. A 10 mm right thyroid nodule is more conspicuous on the current examination and may have slightly enlarged. Lymph nodes: Small left level IB and II lymph nodes are slightly larger than on the prior study, measuring up to 7 mm in short axis and likely reactive a 6 mm left level III lymph node is unchanged. Right level IB lymph nodes measure up to 8 mm in short axis. Vascular: The aortic arch atherosclerosis. Incidental average right subclavian artery, a normal variant. Mild calcified atherosclerosis at the right greater than left carotid bifurcations. Limited intracranial: Unremarkable. Visualized orbits: Bilateral cataract extraction. Mastoids and visualized paranasal sinuses: Clear Skeleton: All mandibular teeth are absent. Mild cervical disc degeneration. Upper chest: 3 mm apical right upper lobe nodule (series 5, image 90, previously 1.5 mm). New 2 mm anterior right upper lobe nodule (series 5, image 104). Increased size of a lobular left upper lobe nodule measuring 14 x 12 mm (mean 13 mm, series 5, image 101, previously 11 x 9 mm). Other: None. IMPRESSION: 1. Prominent inflammation centered in the left submandibular space favored to reflect acute submandibular sialadenitis. No obstructing ductal stone identified. No organized fluid collection identified on this unenhanced study. 2. Interval resolution of complex right submandibular space collection on the 2015 CT. 3. Mild bilateral anterior cervical lymph node enlargement, most likely reactive. 4.  Mild enlargement of a 13 mm left upper lobe lung nodule which remains concerning for primary bronchogenic carcinoma. Recommend further evaluation with PET-CT. 5. Mild enlargement of a 3 mm right upper lobe lung nodule. New 2 mm right upper lobe nodule. Electronically Signed   By: Sebastian AcheAllen  Grady M.D.   On: 05/28/2017 15:01    EKG: Orders placed or performed in visit on 05/24/06  . EKG 12-Lead    IMPRESSION AND PLAN:  * Acute renal failure   Dehydration secondary to decreased oral intake due to vomiting and nausea.   IV fluids for now. Monitor.   Hold nephrotoxic and antihypertensive agents.  * Metabolic acidosis- likely secondary to infection and renal failure.   IV fluids and recheck.  * Sialoadenitis   Spoke to ENT   IV vanc+ zosyn.   IV steroids.   Further plan per ENT.  * Diabetes   For oral medication and keep on sliding scale coverage.  * Hypertension   Hydrochlorothiazide is held due to renal failure.  * lung nodules   They are enlarged compared to past.   Need further eval, once renal func improves or may be as out pt.   I spoke to her husband about this in room.  All the records are reviewed and case discussed with ED provider. Management plans discussed with the patient, family and they are in agreement.  CODE STATUS:Full code.  Code Status History    This patient does not have a recorded code status. Please follow your organizational policy for patients in this situation.     Husband was present in the room.  TOTAL TIME TAKING CARE OF THIS PATIENT: 50 minutes.    Altamese DillingVaibhavkumar Lorena Benham M.D on 05/28/2017   Between 7am to 6pm - Pager - [phone]  After 6pm go to [email or web address] - password [name]  [facility] [place]  Office  [phone]  CC: Primary care physician; Jaclyn Shaggy, MD   Note: This dictation was prepared with Dragon dictation along with smaller phrase technology. Any transcriptional errors that result from this process are  unintentional.

## 2017-05-28 NOTE — ED Triage Notes (Signed)
L submandibular swelling, redness and hardness x 2 weeks. History of parotid stone on R side in past.

## 2017-05-28 NOTE — ED Provider Notes (Signed)
[facility] Emergency Department Provider Note    First MD Initiated Contact with Patient 05/28/17 1344     (approximate)  I have reviewed the triage vital signs and the nursing notes.   HISTORY  Chief Complaint Facial Swelling    HPI Charlann LangeBarbara Hollings is a 77 y.o. female presents with worsening swelling and pain in the left jaw.  States that she has a history of very glands with similar presentation.  Denies any fevers at home but has had nausea and is not been a.  States that she was recently put on clindamycin for this but the swelling is continued to worsen.  Denies any recent procedures.  Has had to have this area drained in the past.  Denies any chest pain or shortness of breath.  No abdominal pain.  Family is uncertain as to whether she is been able to keep any of the antibiotics down because she has been vomiting so frequently.  Past Medical History:  Diagnosis Date  . Diabetes mellitus without complication (HCC)    No family history on file. Past Surgical History:  Procedure Laterality Date  . ABDOMINAL HYSTERECTOMY     There are no active problems to display for this patient.     Prior to Admission medications   Medication Sig Start Date End Date Taking? Authorizing Provider  acetaminophen (TYLENOL) 650 MG CR tablet Take 650 mg by mouth every 8 (eight) hours as needed for pain.   Yes [provider]  atenolol (TENORMIN) 50 MG tablet Take 50 mg by mouth 2 (two) times daily. 05/04/17  Yes [provider]  clindamycin (CLEOCIN) 150 MG capsule Take 150 mg by mouth every 6 (six) hours. 05/25/17  Yes [provider]  glipiZIDE-metformin (METAGLIP) 5-500 MG tablet Take 1 tablet by mouth 2 (two) times daily. 04/03/17  Yes [provider]  simvastatin (ZOCOR) 40 MG tablet Take 40 mg by mouth daily. 05/04/17  Yes [provider]  traMADol (ULTRAM) 50 MG tablet Take 50 mg by mouth every 6 (six) hours as needed  for pain. 05/25/17  Yes [provider]  triamterene-hydrochlorothiazide (MAXZIDE-25) 37.5-25 MG tablet Take 1 tablet by mouth 2 (two) times daily. 05/04/17  Yes [provider]    Allergies Sulfa antibiotics    Social History Social History   Tobacco Use  . Smoking status: Never Smoker  Substance Use Topics  . Alcohol use: Not on file  . Drug use: Not on file    Review of Systems Patient denies headaches, rhinorrhea, blurry vision, numbness, shortness of breath, chest pain, edema, cough, abdominal pain, nausea, vomiting, diarrhea, dysuria, fevers, rashes or hallucinations unless otherwise stated above in HPI. ____________________________________________   PHYSICAL EXAM:  VITAL SIGNS: Vitals:   05/28/17 1400 05/28/17 1500  BP: (!) 165/68 (!) 129/53  Pulse:    Resp:    Temp:    SpO2:      Constitutional: Alert and oriented. Ill appearing  in no acute distress. Eyes: Conjunctivae are normal.  Head: Atraumatic. Nose: No congestion/rhinnorhea. Mouth/Throat: Mucous membranes are moist.   Neck: No stridor. Painless ROM. Large baseball sized erythematous and tender mass inferior to left jaw.  nonpulsatile Cardiovascular: Normal rate, regular rhythm. Grossly normal heart sounds.  Good peripheral circulation. Respiratory: Normal respiratory effort.  No retractions. Lungs CTAB. Gastrointestinal: Soft and nontender. No distention. No abdominal bruits. No CVA tenderness. Genitourinary:  Musculoskeletal: No lower extremity tenderness nor edema.  No joint effusions. Neurologic:  Normal speech  and language. No gross focal neurologic deficits are appreciated. No facial droop Skin:  Skin is warm, dry and intact. No rash noted. Psychiatric: Mood and affect are normal. Speech and behavior are normal.  ____________________________________________   LABS (all labs ordered are listed, but only abnormal results are displayed)  Results for orders placed or performed  during the hospital encounter of 05/28/17 (from the past 24 hour(s))  CBC with Differential     Status: Abnormal   Collection Time: 05/28/17 12:00 PM  Result Value Ref Range   WBC 16.8 (H) 3.6 - 11.0 K/uL   RBC 3.37 (L) 3.80 - 5.20 MIL/uL   Hemoglobin 10.5 (L) 12.0 - 16.0 g/dL   HCT 81.1 (L) 91.4 - 78.2 %   MCV 96.4 80.0 - 100.0 fL   MCH 31.1 26.0 - 34.0 pg   MCHC 32.3 32.0 - 36.0 g/dL   RDW 95.6 21.3 - 08.6 %   Platelets 452 (H) 150 - 440 K/uL   Neutrophils Relative % 86 %   Neutro Abs 14.2 (H) 1.4 - 6.5 K/uL   Lymphocytes Relative 10 %   Lymphs Abs 1.7 1.0 - 3.6 K/uL   Monocytes Relative 4 %   Monocytes Absolute 0.7 0.2 - 0.9 K/uL   Eosinophils Relative 0 %   Eosinophils Absolute 0.1 0 - 0.7 K/uL   Basophils Relative 0 %   Basophils Absolute 0.1 0 - 0.1 K/uL  Comprehensive metabolic panel     Status: Abnormal   Collection Time: 05/28/17 12:00 PM  Result Value Ref Range   Sodium 135 135 - 145 mmol/L   Potassium 3.4 (L) 3.5 - 5.1 mmol/L   Chloride 104 101 - 111 mmol/L   CO2 10 (L) 22 - 32 mmol/L   Glucose, Bld 225 (H) 65 - 99 mg/dL   BUN 64 (H) 6 - 20 mg/dL   Creatinine, Ser 5.78 (H) 0.44 - 1.00 mg/dL   Calcium 9.2 8.9 - 46.9 mg/dL   Total Protein 8.3 (H) 6.5 - 8.1 g/dL   Albumin 3.3 (L) 3.5 - 5.0 g/dL   AST 19 15 - 41 U/L   ALT 10 (L) 14 - 54 U/L   Alkaline Phosphatase 81 38 - 126 U/L   Total Bilirubin 0.6 0.3 - 1.2 mg/dL   GFR calc non Af Amer 22 (L) >60 mL/min   GFR calc Af Amer 25 (L) >60 mL/min   Anion gap 21 (H) 5 - 15  Lactic acid, plasma     Status: None   Collection Time: 05/28/17  1:42 PM  Result Value Ref Range   Lactic Acid, Venous 1.2 0.5 - 1.9 mmol/L  Salicylate level     Status: None   Collection Time: 05/28/17  1:42 PM  Result Value Ref Range   Salicylate Lvl <7.0 2.8 - 30.0 mg/dL   ____________________________________________   ____________________________________________  RADIOLOGY  I personally reviewed all radiographic images ordered to  evaluate for the above acute complaints and reviewed radiology reports and findings.  These findings were personally discussed with the patient.  Please see medical record for radiology report.  ____________________________________________   PROCEDURES  Procedure(s) performed:  Procedures    Critical Care performed: no ____________________________________________   INITIAL IMPRESSION / ASSESSMENT AND PLAN / ED COURSE  Pertinent labs & imaging results that were available during my care of the patient were reviewed by me and considered in my medical decision making (see chart for details).  DDX: Ludwick's, sialoadenitis, peritonsillar abscess, abscess, thyroiditis,  necrotizing soft tissue infection, mass.  Paulita Licklider is a 77 y.o. who presents to the ED with large swelling to left lower jaw as described above.  Blood work does show leukocytosis and AK I with metabolic acidosis likely secondary to decreased oral intake and vomiting.  Her abdominal exam is soft and benign.  CT imaging ordered to evaluate for mass versus thyroiditis or Ludwick's angina shows evidence of enlarged left submandibular gland without evidence of stone.  There is area of surrounding stranding.  Patient will be given broad-spectrum antibiotics.  Will give IV fluids.  Clinical Course as of May 28 1549  Mon May 28, 2017  1523 Eosinophil: 0 [PR]    Clinical Course User Index [PR] Willy Eddy, MD   CT imaging shows evidence of sialoadenitis without stone.  I spoke with Dr. Elenore Rota of ENT who agrees a plan for IV antibiotics and steroids.  No indication for incision and drainage at this time.  Based on her AK I dehydration will for IV fluids and IV antibiotics and further medical management.   ____________________________________________   FINAL CLINICAL IMPRESSION(S) / ED DIAGNOSES  Final diagnoses:  Sialoadenitis of submandibular gland  AKI (acute kidney injury) (HCC)  Metabolic acidosis       NEW MEDICATIONS STARTED DURING THIS VISIT:  New Prescriptions   No medications on file     Note:  This document was prepared using Dragon voice recognition software and may include unintentional dictation errors.    Willy Eddy, MD 05/28/17 [id number]

## 2017-05-28 NOTE — Progress Notes (Signed)
Pharmacy Antibiotic Note  Tiffany LangeBarbara Manning is a 77 y.o. female admitted on 05/28/2017 with sialoadenitis.  Pharmacy has been consulted for vancomycin and Zosyn dosing. Pt also with AKI.  Plan: Zosyn 3.375 g IV q12h EI.  Pt received vancomycin 1 g IV in the ED at 1427, which provides loading dose of 17.2 mg/kg. Current PK suggests dosing of vancomycin 1000 mg IV q48 h for goal trough 15-20 mcg/ml (ordered for now to start 1/23 at 1400). Pt with AKI; will need to monitor renal function closely and adjust dosing. Will see what BMET looks like in AM and adjust dose/plan as warranted. May need to dose off of levels.  Ke 0.018, half life 38.51, Vd 40.7 L     Height: 5' (152.4 cm) Weight: 128 lb (58.1 kg) IBW/kg (Calculated) : 45.5  Temp (24hrs), Avg:97.5 F (36.4 C), Min:97.5 F (36.4 C), Max:97.5 F (36.4 C)  Recent Labs  Lab 05/28/17 1200 05/28/17 1342  WBC 16.8*  --   CREATININE 2.09*  --   LATICACIDVEN  --  1.2    Estimated Creatinine Clearance: 18 mL/min (A) (by C-G formula based on SCr of 2.09 mg/dL (H)).    Allergies  Allergen Reactions  . Sulfa Antibiotics Nausea And Vomiting    Antimicrobials this admission: Vanc/Zosyn 1/21 >>  Dose adjustments this admission:   Microbiology results: 1/21 BCx: sent x2   Thank you for allowing pharmacy to be a part of this patient's care.  Tiffany Manning, Tiffany Manning L 05/28/2017 5:44 PM

## 2017-05-29 DIAGNOSIS — E43 Unspecified severe protein-calorie malnutrition: Secondary | ICD-10-CM

## 2017-05-29 LAB — URINALYSIS, COMPLETE (UACMP) WITH MICROSCOPIC
BACTERIA UA: NONE SEEN
BILIRUBIN URINE: NEGATIVE
Glucose, UA: 50 mg/dL — AB
KETONES UR: NEGATIVE mg/dL
NITRITE: NEGATIVE
PH: 6 (ref 5.0–8.0)
Protein, ur: 100 mg/dL — AB
SPECIFIC GRAVITY, URINE: 1.014 (ref 1.005–1.030)

## 2017-05-29 LAB — CBC
HEMATOCRIT: 27.7 % — AB (ref 35.0–47.0)
HEMOGLOBIN: 9.1 g/dL — AB (ref 12.0–16.0)
MCH: 31.5 pg (ref 26.0–34.0)
MCHC: 32.8 g/dL (ref 32.0–36.0)
MCV: 96 fL (ref 80.0–100.0)
Platelets: 376 10*3/uL (ref 150–440)
RBC: 2.88 MIL/uL — ABNORMAL LOW (ref 3.80–5.20)
RDW: 14 % (ref 11.5–14.5)
WBC: 10.2 10*3/uL (ref 3.6–11.0)

## 2017-05-29 LAB — VANCOMYCIN, RANDOM: VANCOMYCIN RM: 10

## 2017-05-29 LAB — BASIC METABOLIC PANEL
ANION GAP: 14 (ref 5–15)
BUN: 52 mg/dL — ABNORMAL HIGH (ref 6–20)
CO2: 12 mmol/L — AB (ref 22–32)
Calcium: 8.2 mg/dL — ABNORMAL LOW (ref 8.9–10.3)
Chloride: 111 mmol/L (ref 101–111)
Creatinine, Ser: 1.46 mg/dL — ABNORMAL HIGH (ref 0.44–1.00)
GFR calc Af Amer: 39 mL/min — ABNORMAL LOW (ref >60)
GFR, EST NON AFRICAN AMERICAN: 33 mL/min — AB (ref >60)
GLUCOSE: 357 mg/dL — AB (ref 65–99)
POTASSIUM: 2.6 mmol/L — AB (ref 3.5–5.1)
Sodium: 137 mmol/L (ref 135–145)

## 2017-05-29 LAB — POTASSIUM: POTASSIUM: 3.9 mmol/L (ref 3.5–5.1)

## 2017-05-29 LAB — GLUCOSE, CAPILLARY
GLUCOSE-CAPILLARY: 394 mg/dL — AB (ref 65–99)
Glucose-Capillary: 305 mg/dL — ABNORMAL HIGH (ref 65–99)
Glucose-Capillary: 345 mg/dL — ABNORMAL HIGH (ref 65–99)
Glucose-Capillary: 440 mg/dL — ABNORMAL HIGH (ref 65–99)

## 2017-05-29 MED ORDER — POTASSIUM CHLORIDE CRYS ER 20 MEQ PO TBCR
40.0000 meq | EXTENDED_RELEASE_TABLET | Freq: Once | ORAL | Status: AC
  Administered 2017-05-29: 06:00:00 40 meq via ORAL
  Filled 2017-05-29: qty 2

## 2017-05-29 MED ORDER — VANCOMYCIN HCL IN DEXTROSE 1-5 GM/200ML-% IV SOLN
1000.0000 mg | Freq: Once | INTRAVENOUS | Status: AC
Start: 2017-05-29 — End: 2017-05-29
  Administered 2017-05-29: 1000 mg via INTRAVENOUS
  Filled 2017-05-29: qty 200

## 2017-05-29 MED ORDER — INSULIN GLARGINE 100 UNIT/ML ~~LOC~~ SOLN
10.0000 [IU] | Freq: Every day | SUBCUTANEOUS | Status: DC
Start: 2017-05-29 — End: 2017-05-30
  Administered 2017-05-29 – 2017-05-30 (×2): 10 [IU] via SUBCUTANEOUS
  Filled 2017-05-29 (×2): qty 0.1

## 2017-05-29 MED ORDER — PIPERACILLIN-TAZOBACTAM 3.375 G IVPB
3.3750 g | Freq: Three times a day (TID) | INTRAVENOUS | Status: DC
  Administered 2017-05-29 – 2017-06-05 (×20): 3.375 g via INTRAVENOUS
  Filled 2017-05-29 (×20): qty 50

## 2017-05-29 MED ORDER — INSULIN ASPART 100 UNIT/ML ~~LOC~~ SOLN
0.0000 [IU] | Freq: Three times a day (TID) | SUBCUTANEOUS | Status: DC
  Administered 2017-05-29: 17:00:00 11 [IU] via SUBCUTANEOUS
  Administered 2017-05-30: 17:00:00 15 [IU] via SUBCUTANEOUS
  Administered 2017-05-30: 08:00:00 11 [IU] via SUBCUTANEOUS
  Administered 2017-05-30: 13:00:00 15 [IU] via SUBCUTANEOUS
  Administered 2017-05-31: 18:00:00 5 [IU] via SUBCUTANEOUS
  Administered 2017-05-31: 15 [IU] via SUBCUTANEOUS
  Administered 2017-05-31: 12:00:00 5 [IU] via SUBCUTANEOUS
  Administered 2017-06-01: 08:00:00 11 [IU] via SUBCUTANEOUS
  Administered 2017-06-01: 2 [IU] via SUBCUTANEOUS
  Filled 2017-05-29 (×9): qty 1

## 2017-05-29 MED ORDER — POTASSIUM CHLORIDE CRYS ER 20 MEQ PO TBCR
40.0000 meq | EXTENDED_RELEASE_TABLET | Freq: Two times a day (BID) | ORAL | Status: DC
  Administered 2017-05-29 – 2017-06-01 (×7): 40 meq via ORAL
  Filled 2017-05-29 (×8): qty 2

## 2017-05-29 MED ORDER — VANCOMYCIN HCL IN DEXTROSE 1-5 GM/200ML-% IV SOLN
1000.0000 mg | INTRAVENOUS | Status: DC
  Administered 2017-05-30 – 2017-06-01 (×2): 1000 mg via INTRAVENOUS
  Filled 2017-05-29 (×2): qty 200

## 2017-05-29 MED ORDER — GLUCERNA SHAKE PO LIQD
237.0000 mL | Freq: Three times a day (TID) | ORAL | Status: DC
  Administered 2017-05-29 – 2017-06-03 (×3): 237 mL via ORAL

## 2017-05-29 MED ORDER — INSULIN ASPART 100 UNIT/ML ~~LOC~~ SOLN
0.0000 [IU] | Freq: Every day | SUBCUTANEOUS | Status: DC
  Administered 2017-05-29: 5 [IU] via SUBCUTANEOUS
  Administered 2017-05-30: 4 [IU] via SUBCUTANEOUS
  Administered 2017-05-31 – 2017-06-01 (×2): 2 [IU] via SUBCUTANEOUS
  Filled 2017-05-29 (×4): qty 1

## 2017-05-29 MED ORDER — INSULIN ASPART 100 UNIT/ML ~~LOC~~ SOLN
7.0000 [IU] | Freq: Once | SUBCUTANEOUS | Status: AC
  Administered 2017-05-29: 22:00:00 7 [IU] via SUBCUTANEOUS
  Filled 2017-05-29: qty 1

## 2017-05-29 MED ORDER — MAGNESIUM SULFATE 2 GM/50ML IV SOLN
2.0000 g | Freq: Once | INTRAVENOUS | Status: AC
  Administered 2017-05-29: 2 g via INTRAVENOUS
  Filled 2017-05-29: qty 50

## 2017-05-29 MED ORDER — POTASSIUM CHLORIDE 10 MEQ/100ML IV SOLN
10.0000 meq | INTRAVENOUS | Status: AC
  Administered 2017-05-29 (×2): 10 meq via INTRAVENOUS
  Filled 2017-05-29 (×2): qty 100

## 2017-05-29 NOTE — Progress Notes (Signed)
Inpatient Diabetes Program Recommendations  AACE/ADA: New Consensus Statement on Inpatient Glycemic Control (2015)  Target Ranges:  Prepandial:   less than 140 mg/dL      Peak postprandial:   less than 180 mg/dL (1-2 hours)      Critically ill patients:  140 - 180 mg/dL   Results for Tiffany Manning, Tiffany Manning (MRN [id number]) as of 05/29/2017 09:30  Ref. Range 05/28/2017 17:43 05/28/2017 19:56 05/29/2017 07:45  Glucose-Capillary Latest Ref Range: 65 - 99 mg/dL 474196 (H) 259327 (H) 563305 (H)    Admit with: Left anterior neck infection and abscess   History: DM  Home DM Meds: Metaglip 5/500 mg BID  Current Insulin Orders: Novolog Sensitive Correction Scale/ SSI (0-9 units) TID AC      MD- Note patient getting Decadron 10 mg BID.  CBGs quite elevated.  If you plan to continue patient on Decadron, please consider the following:  1. Start Lantus 10 units daily (0.2 units/kg dosing)  2. Increase Novolog SSI to Moderate scale (0-15 units) TID AC + HS     --Will follow patient during hospitalization--  Ambrose FinlandJeannine Johnston Murriel Eidem RN, MSN, CDE Diabetes Coordinator Inpatient Glycemic Control Team Team Pager: [phone] (8a-5p)

## 2017-05-29 NOTE — Progress Notes (Signed)
Patient ID: Tiffany LangeBarbara Lahti, female   DOB: 11/03/1940, 77 y.o.   MRN: [id number]  [facility] PROGRESS NOTE  Tiffany LangeBarbara Mackel [id number] DOB: 06/09/1940 DOA: 05/28/2017 PCP: Jaclyn Shaggyate, Denny C, MD  HPI/Subjective: Patient feels okay.  She was asking to go home.  She has an abscess left neck.  Patient also has a nonhealing wound on the left leg.  Objective: Vitals:   05/29/17 0435 05/29/17 1356  BP: (!) 128/50 (!) 135/43  Pulse: (!) 55 67  Resp: 18 18  Temp: 97.6 F (36.4 C) 97.6 F (36.4 C)  SpO2: 100% 100%    Filed Weights   05/28/17 1158  Weight: 58.1 kg (128 lb)    ROS: Review of Systems  Constitutional: Negative for chills and fever.  Eyes: Negative for blurred vision.  Respiratory: Negative for cough and shortness of breath.   Cardiovascular: Negative for chest pain.  Gastrointestinal: Negative for abdominal pain, constipation, diarrhea, nausea and vomiting.  Genitourinary: Negative for dysuria.  Musculoskeletal: Negative for joint pain.  Neurological: Negative for dizziness and headaches.   Exam: Physical Exam  HENT:  Nose: No mucosal edema.  Mouth/Throat: No oropharyngeal exudate or posterior oropharyngeal edema.  Eyes: Conjunctivae, EOM and lids are normal. Pupils are equal, round, and reactive to light.  Neck: No JVD present. Carotid bruit is not present. No edema present. No thyroid mass and no thyromegaly present.  Large abscess left neck.  Cardiovascular: S1 normal and S2 normal. Exam reveals no gallop.  No murmur heard. Pulses:      Dorsalis pedis pulses are 2+ on the right side, and 2+ on the left side.  Respiratory: No respiratory distress. She has no wheezes. She has no rhonchi. She has no rales.  GI: Soft. Bowel sounds are normal. There is no tenderness.  Musculoskeletal:       Right shoulder: She exhibits no swelling.  Lymphadenopathy:    She has no cervical adenopathy.  Neurological: She is alert. No cranial nerve deficit.  Skin: Skin is warm. No  rash noted. Nails show no clubbing.  Large nonhealing wound lateral left leg.  Psychiatric: She has a normal mood and affect.      Data Reviewed: Basic Metabolic Panel: Recent Labs  Lab 05/28/17 1200 05/28/17 1738 05/29/17 0446  NA 135  --  137  K 3.4*  --  2.6*  CL 104  --  111  CO2 10*  --  12*  GLUCOSE 225*  --  357*  BUN 64*  --  52*  CREATININE 2.09* 1.87* 1.46*  CALCIUM 9.2  --  8.2*   Liver Function Tests: Recent Labs  Lab 05/28/17 1200  AST 19  ALT 10*  ALKPHOS 81  BILITOT 0.6  PROT 8.3*  ALBUMIN 3.3*   CBC: Recent Labs  Lab 05/28/17 1200 05/29/17 0446  WBC 16.8* 10.2  NEUTROABS 14.2*  --   HGB 10.5* 9.1*  HCT 32.5* 27.7*  MCV 96.4 96.0  PLT 452* 376    CBG: Recent Labs  Lab 05/28/17 1743 05/28/17 1956 05/29/17 0745 05/29/17 1153  GLUCAP 196* 327* 305* 394*    Recent Results (from the past 240 hour(s))  Blood Culture (routine x 2)     Status: None (Preliminary result)   Collection Time: 05/28/17  2:03 PM  Result Value Ref Range Status   Specimen Description BLOOD LAC  Final   Special Requests   Final    BOTTLES DRAWN AEROBIC AND ANAEROBIC Blood Culture results may not be optimal due  to an excessive volume of blood received in culture bottles   Culture   Final    NO GROWTH < 24 HOURS Performed at [facility], [address] [place], [place] [address]    Report Status PENDING  Incomplete  Blood Culture (routine x 2)     Status: None (Preliminary result)   Collection Time: 05/28/17  2:08 PM  Result Value Ref Range Status   Specimen Description BLOOD LFOA  Final   Special Requests   Final    BOTTLES DRAWN AEROBIC AND ANAEROBIC Blood Culture results may not be optimal due to an excessive volume of blood received in culture bottles   Culture   Final    NO GROWTH < 24 HOURS Performed at [facility], [address] [place], [place] [address]    Report Status PENDING  Incomplete     Studies: Ct Soft Tissue Neck  Wo Contrast  Result Date: 05/28/2017 CLINICAL DATA:  Left submandibular swelling, redness, and firmness for 2 weeks. EXAM: CT NECK WITHOUT CONTRAST TECHNIQUE: Multidetector CT imaging of the neck was performed following the standard protocol without intravenous contrast. COMPARISON:  10/06/2013 FINDINGS: Pharynx and larynx: Symmetric pharyngeal soft tissues without evidence of mass. Unremarkable larynx. No retropharyngeal fluid collection. Salivary glands: The large complex fluid collection extending inferiorly from the right submandibular space on the prior CT is no longer present. There is a punctate calcification in the anterior right submandibular gland without evidence of acute inflammation. There is new prominent inflammatory change in the left submandibular space with suspected phlegmon extending anteriorly from and inseparable from the left submandibular gland. There may be a punctate calcification near the anteroinferior aspect of the left submandibular gland, however no submandibular ductal stones or dilatation is seen. The left platysma is thickened, and inflammatory reticulation extends into the overlying subcutaneous fat with associated skin thickening. No organized fluid collection is identified on this unenhanced study. The parotid glands are unremarkable. Thyroid: The subcentimeter left thyroid nodule on the prior study is not as well seen on the current examination and may have decreased in size. A 10 mm right thyroid nodule is more conspicuous on the current examination and may have slightly enlarged. Lymph nodes: Small left level IB and II lymph nodes are slightly larger than on the prior study, measuring up to 7 mm in short axis and likely reactive a 6 mm left level III lymph node is unchanged. Right level IB lymph nodes measure up to 8 mm in short axis. Vascular: The aortic arch atherosclerosis. Incidental average right subclavian artery, a normal variant. Mild calcified atherosclerosis at the  right greater than left carotid bifurcations. Limited intracranial: Unremarkable. Visualized orbits: Bilateral cataract extraction. Mastoids and visualized paranasal sinuses: Clear Skeleton: All mandibular teeth are absent. Mild cervical disc degeneration. Upper chest: 3 mm apical right upper lobe nodule (series 5, image 90, previously 1.5 mm). New 2 mm anterior right upper lobe nodule (series 5, image 104). Increased size of a lobular left upper lobe nodule measuring 14 x 12 mm (mean 13 mm, series 5, image 101, previously 11 x 9 mm). Other: None. IMPRESSION: 1. Prominent inflammation centered in the left submandibular space favored to reflect acute submandibular sialadenitis. No obstructing ductal stone identified. No organized fluid collection identified on this unenhanced study. 2. Interval resolution of complex right submandibular space collection on the 2015 CT. 3. Mild bilateral anterior cervical lymph node enlargement, most likely reactive. 4. Mild enlargement of a 13 mm left upper lobe  lung nodule which remains concerning for primary bronchogenic carcinoma. Recommend further evaluation with PET-CT. 5. Mild enlargement of a 3 mm right upper lobe lung nodule. New 2 mm right upper lobe nodule. Electronically Signed   By: Sebastian Ache M.D.   On: 05/28/2017 15:01    Scheduled Meds: . dexamethasone  10 mg Intravenous Q12H  . feeding supplement (GLUCERNA SHAKE)  237 mL Oral TID WC  . heparin  5,000 Units Subcutaneous Q8H  . insulin aspart  0-15 Units Subcutaneous TID WC  . insulin aspart  0-5 Units Subcutaneous QHS  . insulin glargine  10 Units Subcutaneous Daily  . potassium chloride  40 mEq Oral BID  . simvastatin  40 mg Oral Daily   Continuous Infusions: . sodium chloride 100 mL/hr at 05/29/17 1450  . piperacillin-tazobactam (ZOSYN)  IV    . vancomycin    . [START ON 05/30/2017] vancomycin      Assessment/Plan:  1. Abscess left neck.  On vancomycin and Zosyn.  Culture sent off.   continue  Decadron.  Appreciate ENT consultation. 2. Acute kidney injury on IV fluid hydration.  We will decrease the dose 3. Hypokalemia.  Maxide is not a good medication for this patient.  Replace potassium 4. Uncontrolled type 2 diabetes mellitus.  Start Lantus insulin and sliding scale while on steroids.  Check a hemoglobin A1c. 5. Nonhealing wound of the left leg.  We will get wound care consultation and likely will need wound care center as outpatient. 6. Hyperlipidemia unspecified on simvastatin 7. Essential hypertension.  Blood pressure seems okay and we are holding medications at this point 8. Anemia.  Check iron studies 9. Urinalysis looks positive follow-up urine culture.  Code Status:     Code Status Orders  (From admission, onward)        Start     Ordered   05/28/17 1638  Full code  Continuous     05/28/17 1637    Code Status History    Date Active Date Inactive Code Status Order ID Comments User Context   This patient has a current code status but no historical code status.     Family Communication: Husband at the bedside Disposition Plan: Likely will need a few days of IV antibiotics  Consultants:  ENT  Antibiotics:  Vancomycin  Zosyn  Time spent: 28 minutes  [employer]

## 2017-05-29 NOTE — Progress Notes (Signed)
Patient BS was elevated tonight. (440) Dr. Jeanene Erballed new order given, will continue to monitor.

## 2017-05-29 NOTE — Progress Notes (Signed)
Initial Nutrition Assessment  DOCUMENTATION CODES:   Severe malnutrition in context of acute illness/injury  INTERVENTION:   - Glucerna Shake po TID, each supplement provides 220 kcal and 10 grams of protein - Encourage PO intake of soft foods on the menu  NUTRITION DIAGNOSIS:   Severe Malnutrition related to acute illness(AKI) as evidenced by mild fat depletion, moderate muscle depletion, energy intake < or equal to 50% for > or equal to 5 days.  GOAL:   Patient will meet greater than or equal to 90% of their needs  MONITOR:   PO intake, Supplement acceptance, Labs  REASON FOR ASSESSMENT:   Consult Poor PO, Wound healing  ASSESSMENT:   77 yo female with PMH significant for DM and HTN who presented to ED with large swelling to left lower jaw. Blood work shows leukocytosis and AKI with metabolic acidosis likely secondary to decreased oral intake and vomiting.  Spoke with pt and pt's husband at bedside. Pt reports that some foods on the soft diet are "too tough to chew," especially meats. Walked through [employer] with pt and her husband to point out items that would be extremely soft: yogurt, mashed potatoes, soup, etc. Pt agreeable to receiving oral nutrition supplement with meals to increase protein intake. Meal completion logged as 100%. Pt with 75% completed lunch tray in room at time of visit. Pt still eating yogurt.  Pt states she hasn't been able to eat anything or "keep anything down" over the past 2 weeks. States she has been able to tolerate meals during current admission.  Pt states her usual body weight is approximately 145 lbs. Pt unsure when she last weighed this but knows she has been losing weight and notices her clothes fitting more loosely. No previous weights are charted for pt.  Medications reviewed and include: Novolog, PO KCl  Labs reviewed: potassium 2.6 (L), BUN 52 (H), creatinine 52 (H), calcium 8.2 (L) CBG's: 305, 327, 193  NUTRITION - FOCUSED  PHYSICAL EXAM:    Most Recent Value  Orbital Region  Mild depletion  Upper Arm Region  Mild depletion  Thoracic and Lumbar Region  Mild depletion  Buccal Region  No depletion  Temple Region  Mild depletion  Clavicle Bone Region  Moderate depletion  Clavicle and Acromion Bone Region  Moderate depletion  Scapular Bone Region  Unable to assess  Dorsal Hand  No depletion  Patellar Region  Moderate depletion  Anterior Thigh Region  Moderate depletion  Posterior Calf Region  Moderate depletion  Edema (RD Assessment)  None  Hair  Reviewed  Eyes  Reviewed  Mouth  Reviewed  Skin  Reviewed  Nails  Reviewed       Diet Order:  DIET SOFT Room service appropriate? Yes; Fluid consistency: Thin  EDUCATION NEEDS:   No education needs have been identified at this time  Skin:  Skin Assessment: Skin Integrity Issues: Skin Integrity Issues:: Other (Comment) Other: open wound to left leg  Last BM:  05/28/17 medium Type 3  Height:   Ht Readings from Last 1 Encounters:  05/28/17 5' (1.524 m)    Weight:   Wt Readings from Last 1 Encounters:  05/28/17 128 lb (58.1 kg)    Ideal Body Weight:  45.5 kg  BMI:  Body mass index is 25 kg/m.  Estimated Nutritional Needs:   Kcal:  1300-1500 kcal/day  Protein:  70-85 grams/day  Fluid:  1.3-1.5 L/day    Earma ReadingKate Jablonski Kamyla Olejnik, MS, RD, LDN Pager: [phone] Weekend/After Hours: [phone]

## 2017-05-29 NOTE — Progress Notes (Signed)
Pharmacy Antibiotic Note  Tiffany LangeBarbara Manning is a 77 y.o. female admitted on 05/28/2017 with sialoadenitis.  Pharmacy has been consulted for vancomycin and Zosyn dosing. Pt also with AKI.  Plan: - adjust Zosyn to  3.375 g IV q8h EI for Crcl now > 20 ml/min.  Pt received vancomycin 1 g IV in the ED at 1427, which provides loading dose of 17.2 mg/kg. Current PK suggests dosing of vancomycin 1000 mg IV q48 h for goal trough 15-20 mcg/ml (ordered for now to start 1/23 at 1400). Pt with AKI; will need to monitor renal function closely and adjust dosing. Will see what BMET looks like in AM and adjust dose/plan as warranted. May need to dose off of levels.  Ke 0.018, half life 38.51, Vd 40.7 L  1/22:  Vancomycin random level 24 hours after 1st dose= 10 mcg/ml. (Renal fxn improved Scr 2.09 >> 1.46). Will adjust dosing to Vancomycin 1000 mg IV Q36h. Will give Vancomycin 1000 mg x1 then follow with stacked dosing.   Ke 0.024  t1/2 28.8  Vd 40.6   Anticipate renal fxn may continue to improve. Follow up to schedule Vancomycin trough.  Watch Zosyn/Vancomycin and renal fxn.     Height: 5' (152.4 cm) Weight: 128 lb (58.1 kg) IBW/kg (Calculated) : 45.5  Temp (24hrs), Avg:98.2 F (36.8 C), Min:97.6 F (36.4 C), Max:99 F (37.2 C)  Recent Labs  Lab 05/28/17 1200 05/28/17 1342 05/28/17 1738 05/29/17 0446 05/29/17 1346  WBC 16.8*  --   --  10.2  --   CREATININE 2.09*  --  1.87* 1.46*  --   LATICACIDVEN  --  1.2  --   --   --   VANCORANDOM  --   --   --   --  10    Estimated Creatinine Clearance: 25.7 mL/min (A) (by C-G formula based on SCr of 1.46 mg/dL (H)).    Allergies  Allergen Reactions  . Sulfa Antibiotics Nausea And Vomiting    Antimicrobials this admission: Vanc/Zosyn 1/21 >>  Dose adjustments this admission: 1/22  Random Vanc= 10 mcg/ml (24 hrs after 1st dose): change Vanc 1 gram q48 to 1 gram q36h  Microbiology results: 1/21 BCx: sent x2   Thank you for allowing pharmacy to  be a part of this patient's care.  Tiffany Manning A 05/29/2017 3:21 PM

## 2017-05-29 NOTE — Plan of Care (Signed)
  Progressing Education: Knowledge of General Education information will improve 05/29/2017 1755 - Progressing by Aariona Momon A, RN Health Behavior/Discharge Planning: Ability to manage health-related needs will improve 05/29/2017 1755 - Progressing by Jlon Betker A, RN Clinical Measurements: Ability to maintain clinical measurements within normal limits will improve 05/29/2017 1755 - Progressing by Jamire Shabazz A, RN Will remain free from infection 05/29/2017 1755 - Progressing by Jacqulynn Shappell A, RN Diagnostic test results will improve 05/29/2017 1755 - Progressing by Brayam Boeke A, RN Respiratory complications will improve 05/29/2017 1755 - Progressing by Lenoard Helbert A, RN Cardiovascular complication will be avoided 05/29/2017 1755 - Progressing by Alexx Mcburney A, RN Activity: Risk for activity intolerance will decrease 05/29/2017 1755 - Progressing by Cartier Washko A, RN Nutrition: Adequate nutrition will be maintained 05/29/2017 1755 - Progressing by Trust Leh A, RN Coping: Level of anxiety will decrease 05/29/2017 1755 - Progressing by Mccall Lomax A, RN Elimination: Will not experience complications related to bowel motility 05/29/2017 1755 - Progressing by Trisha Morandi A, RN Will not experience complications related to urinary retention 05/29/2017 1755 - Progressing by Amiee Wiley A, RN Pain Managment: General experience of comfort will improve 05/29/2017 1755 - Progressing by Marsa Matteo A, RN Safety: Ability to remain free from injury will improve 05/29/2017 1755 - Progressing by Mayana Irigoyen A, RN Skin Integrity: Risk for impaired skin integrity will decrease 05/29/2017 1755 - Progressing by Maryanna Stuber A, RN   

## 2017-05-29 NOTE — Progress Notes (Signed)
CRITICAL VALUE STICKER  CRITICAL VALUE: Potassium 2.6  RECEIVER (on-site recipient of call): Henriette CombsSarah Klenner  DATE & TIME NOTIFIED: 05/29/17, 0540  MESSENGER (representative from lab):  MD NOTIFIED: [facility] Pager  TIME OF NOTIFICATION: 0549  RESPONSE: pending

## 2017-05-29 NOTE — Consult Note (Signed)
Tiffany Manning, Tiffany Manning [id number] Sep 14, 1940 Alford Highland, MD  Reason for Consult: Evaluate left submandibular area swelling and infection  HPI: The patient is a 77 year old female with a 2-week onset of some pain and swelling in the left upper neck area.  This is worsened over the last 2 weeks and she was started on some clindamycin by her primary care physician.  She had a lot of nausea and vomiting and was unable to keep it down the wound got significantly worse in the last 24 hours.  Yesterday she noted redness right over the skin and now has a small brown clot area in the anterior submandibular/submental area.  A CT scan done yesterday showed no obvious abscess but swelling of the soft tissue and submandibular area.  She is been started on Zosyn and vancomycin and feels her swelling is significantly less today than it was yesterday.  her pain is subsided somewhat from yesterday as well.  Her CT scan did not show a submandibular stone.  She wears a lower denture and does not have any lower teeth and it did not appear to be secondary to a tooth problem or adjacent to the mandible.  She had a previous stone 2 years ago in her right submandibular gland and just the stone was removed.  The right gland has not given her any further problems.  Allergies:  Allergies  Allergen Reactions  . Sulfa Antibiotics Nausea And Vomiting    ROS: Review of systems normal other than 12 systems except per HPI.  PMH:  Past Medical History:  Diagnosis Date  . Diabetes mellitus without complication (HCC)   . Hypertension     FH:  Family History  Problem Relation Age of Onset  . Diabetes Sister     SH:  Social History   Socioeconomic History  . Marital status: Married    Spouse name: Not on file  . Number of children: Not on file  . Years of education: Not on file  . Highest education level: Not on file  Social Needs  . Financial resource strain: Not hard at all  . Food insecurity - worry: Never true   . Food insecurity - inability: Never true  . Transportation needs - medical: No  . Transportation needs - non-medical: No  Occupational History  . Not on file  Tobacco Use  . Smoking status: Never Smoker  . Smokeless tobacco: Never Used  Substance and Sexual Activity  . Alcohol use: No    Frequency: Never  . Drug use: No  . Sexual activity: Not Currently  Other Topics Concern  . Not on file  Social History Narrative   Lives with husband at home    PSH:  Past Surgical History:  Procedure Laterality Date  . ABDOMINAL HYSTERECTOMY      Physical  Exam: Well-developed and well-nourished white female in no acute distress CN 2-12 grossly intact and symmetric. EAC/TMs normal BL.  She is wearing hearing aids bilaterally.  Oral cavity, lips, gums, ororpharynx normal with no masses or lesions.  She has a lower denture in place and no oral lesions.  There is no swelling the anterior floor of mouth or any evidence of stones or swollen ducts.  Skin warm and dry. Nasal cavity without polyps or purulence. External nose and ears without masses or lesions. Neck supple with swelling in the left anterior submandibular and submental area.  This is approximately 5 cm around.  She has no hot redness of the skin but there is  a 1 cm scab in the middle of it.  I pulled off part of the scab and open up underneath it and got out some watery yellow pus.  This was cultured.   it was then washed with Betadine.  A/P: Patient has left anterior neck infection and abscess that appears to be localized skin problem and probably not originating in the submandibular gland.  This does not appear to be related to any stones or sialoadenitis but instead a localized skin infection that likely is staph aureus.  She had been started on clindamycin but was unable to keep it down and the wound then got much worse and started pointing.  It is now open and draining some and with the vancomycin she should be well covered.  We will see  what the Gram stain and culture shows but she likely can have the Zosyn stopped and then switched to an oral antibiotic once we get culture reports back.  The infection will likely settle down in another 36 hours of antibiotics she should be able to be discharged home on oral antibiotics directed towards the culture.  She can be followed up in the office to make sure that infection is completely cleared.  Can reevaluate tomorrow again if necessary, if the wound is not continuing to settle down well.   Beverly Sessionsaul H Raechelle Sarti 05/29/2017 7:46 AM

## 2017-05-30 LAB — CBC
HCT: 28.2 % — ABNORMAL LOW (ref 35.0–47.0)
HEMOGLOBIN: 9.1 g/dL — AB (ref 12.0–16.0)
MCH: 30.8 pg (ref 26.0–34.0)
MCHC: 32.4 g/dL (ref 32.0–36.0)
MCV: 94.9 fL (ref 80.0–100.0)
Platelets: 403 10*3/uL (ref 150–440)
RBC: 2.97 MIL/uL — AB (ref 3.80–5.20)
RDW: 13.8 % (ref 11.5–14.5)
WBC: 11.9 10*3/uL — ABNORMAL HIGH (ref 3.6–11.0)

## 2017-05-30 LAB — BASIC METABOLIC PANEL
Anion gap: 10 (ref 5–15)
BUN: 37 mg/dL — AB (ref 6–20)
CALCIUM: 8.8 mg/dL — AB (ref 8.9–10.3)
CHLORIDE: 111 mmol/L (ref 101–111)
CO2: 17 mmol/L — ABNORMAL LOW (ref 22–32)
CREATININE: 1.23 mg/dL — AB (ref 0.44–1.00)
GFR calc non Af Amer: 41 mL/min — ABNORMAL LOW (ref >60)
GFR, EST AFRICAN AMERICAN: 48 mL/min — AB (ref >60)
Glucose, Bld: 295 mg/dL — ABNORMAL HIGH (ref 65–99)
Potassium: 3.7 mmol/L (ref 3.5–5.1)
SODIUM: 138 mmol/L (ref 135–145)

## 2017-05-30 LAB — HEMOGLOBIN A1C
Hgb A1c MFr Bld: 7 % — ABNORMAL HIGH (ref 4.8–5.6)
Mean Plasma Glucose: 154.2 mg/dL

## 2017-05-30 LAB — C DIFFICILE QUICK SCREEN W PCR REFLEX
C DIFFICILE (CDIFF) INTERP: NOT DETECTED
C DIFFICILE (CDIFF) TOXIN: NEGATIVE
C Diff antigen: NEGATIVE

## 2017-05-30 LAB — MAGNESIUM: MAGNESIUM: 2 mg/dL (ref 1.7–2.4)

## 2017-05-30 LAB — GLUCOSE, CAPILLARY
Glucose-Capillary: 331 mg/dL — ABNORMAL HIGH (ref 65–99)
Glucose-Capillary: 396 mg/dL — ABNORMAL HIGH (ref 65–99)
Glucose-Capillary: 421 mg/dL — ABNORMAL HIGH (ref 65–99)
Glucose-Capillary: 345 mg/dL — ABNORMAL HIGH (ref 65–99)

## 2017-05-30 LAB — FERRITIN: Ferritin: 177 ng/mL (ref 11–307)

## 2017-05-30 MED ORDER — MUPIROCIN 2 % EX OINT
TOPICAL_OINTMENT | Freq: Every day | CUTANEOUS | Status: DC
  Administered 2017-05-30 – 2017-06-06 (×7): via TOPICAL
  Filled 2017-05-30: qty 22

## 2017-05-30 MED ORDER — DEXAMETHASONE SODIUM PHOSPHATE 10 MG/ML IJ SOLN
4.0000 mg | Freq: Two times a day (BID) | INTRAMUSCULAR | Status: DC
  Administered 2017-05-30: 22:00:00 4 mg via INTRAVENOUS
  Filled 2017-05-30: qty 1

## 2017-05-30 MED ORDER — INSULIN ASPART 100 UNIT/ML ~~LOC~~ SOLN
3.0000 [IU] | Freq: Three times a day (TID) | SUBCUTANEOUS | Status: DC
  Administered 2017-05-30 – 2017-06-06 (×21): 3 [IU] via SUBCUTANEOUS
  Filled 2017-05-30 (×21): qty 1

## 2017-05-30 MED ORDER — ONDANSETRON HCL 4 MG/2ML IJ SOLN
4.0000 mg | Freq: Four times a day (QID) | INTRAMUSCULAR | Status: DC | PRN
  Administered 2017-05-31 – 2017-06-05 (×7): 4 mg via INTRAVENOUS
  Filled 2017-05-30 (×7): qty 2

## 2017-05-30 MED ORDER — INSULIN GLARGINE 100 UNIT/ML ~~LOC~~ SOLN
15.0000 [IU] | Freq: Every day | SUBCUTANEOUS | Status: DC
  Administered 2017-05-31: 08:00:00 15 [IU] via SUBCUTANEOUS
  Filled 2017-05-30: qty 0.15

## 2017-05-30 MED ORDER — LOPERAMIDE HCL 2 MG PO CAPS
2.0000 mg | ORAL_CAPSULE | Freq: Three times a day (TID) | ORAL | Status: DC | PRN
  Administered 2017-05-30: 17:00:00 2 mg via ORAL
  Filled 2017-05-30: qty 1

## 2017-05-30 NOTE — Progress Notes (Signed)
Patient ID: Tiffany Manning, female   DOB: 08/27/1940, 77 y.o.   MRN: [id number]  [employer] PROGRESS NOTE  Shalva Rozycki WUJ:[id number] DOB: 1941-01-08 DOA: 05/28/2017 PCP: Jaclyn Shaggy, MD  HPI/Subjective: Patient having pain after I pressed on her abscess of the neck.  Only a little drainage was able to come out of that.  Patient states she is able to swallow a little bit better.  Patient stating she is having diarrhea.  Objective: Vitals:   05/29/17 1356 05/29/17 2003  BP: (!) 135/43 (!) 127/45  Pulse: 67 61  Resp: 18   Temp: 97.6 F (36.4 C) 98.2 F (36.8 C)  SpO2: 100% 100%    Filed Weights   05/28/17 1158  Weight: 58.1 kg (128 lb)    ROS: Review of Systems  Constitutional: Negative for chills and fever.  Eyes: Negative for blurred vision.  Respiratory: Negative for cough and shortness of breath.   Cardiovascular: Negative for chest pain.  Gastrointestinal: Positive for diarrhea. Negative for abdominal pain, constipation, nausea and vomiting.  Genitourinary: Negative for dysuria.  Musculoskeletal: Negative for joint pain.  Neurological: Negative for dizziness and headaches.   Exam: Physical Exam  HENT:  Nose: No mucosal edema.  Mouth/Throat: No oropharyngeal exudate or posterior oropharyngeal edema.  Eyes: Conjunctivae, EOM and lids are normal. Pupils are equal, round, and reactive to light.  Neck: No JVD present. Carotid bruit is not present. No edema present. No thyroid mass and no thyromegaly present.  Large abscess left neck.  Cardiovascular: S1 normal and S2 normal. Exam reveals no gallop.  No murmur heard. Pulses:      Dorsalis pedis pulses are 2+ on the right side, and 2+ on the left side.  Respiratory: No respiratory distress. She has no wheezes. She has no rhonchi. She has no rales.  GI: Soft. Bowel sounds are normal. There is no tenderness.  Musculoskeletal:       Right shoulder: She exhibits no swelling.  Lymphadenopathy:    She has no cervical  adenopathy.  Neurological: She is alert. No cranial nerve deficit.  Skin: Skin is warm. No rash noted. Nails show no clubbing.  Large nonhealing wound lateral left leg.  Psychiatric: She has a normal mood and affect.      Data Reviewed: Basic Metabolic Panel: Recent Labs  Lab 05/28/17 1200 05/28/17 1738 05/29/17 0446 05/29/17 1551 05/30/17 0447  NA 135  --  137  --  138  K 3.4*  --  2.6* 3.9 3.7  CL 104  --  111  --  111  CO2 10*  --  12*  --  17*  GLUCOSE 225*  --  357*  --  295*  BUN 64*  --  52*  --  37*  CREATININE 2.09* 1.87* 1.46*  --  1.23*  CALCIUM 9.2  --  8.2*  --  8.8*  MG  --   --   --   --  2.0   Liver Function Tests: Recent Labs  Lab 05/28/17 1200  AST 19  ALT 10*  ALKPHOS 81  BILITOT 0.6  PROT 8.3*  ALBUMIN 3.3*   CBC: Recent Labs  Lab 05/28/17 1200 05/29/17 0446 05/30/17 0447  WBC 16.8* 10.2 11.9*  NEUTROABS 14.2*  --   --   HGB 10.5* 9.1* 9.1*  HCT 32.5* 27.7* 28.2*  MCV 96.4 96.0 94.9  PLT 452* 376 403    CBG: Recent Labs  Lab 05/29/17 1153 05/29/17 1644 05/29/17 2136 05/30/17 0740  05/30/17 1225  GLUCAP 394* 345* 440* 331* 396*    Recent Results (from the past 240 hour(s))  Blood Culture (routine x 2)     Status: None (Preliminary result)   Collection Time: 05/28/17  2:03 PM  Result Value Ref Range Status   Specimen Description BLOOD LAC  Final   Special Requests   Final    BOTTLES DRAWN AEROBIC AND ANAEROBIC Blood Culture results may not be optimal due to an excessive volume of blood received in culture bottles   Culture   Final    NO GROWTH 2 DAYS Performed at [facility], [address] [place], [place] [address]    Report Status PENDING  Incomplete  Blood Culture (routine x 2)     Status: None (Preliminary result)   Collection Time: 05/28/17  2:08 PM  Result Value Ref Range Status   Specimen Description BLOOD LFOA  Final   Special Requests   Final    BOTTLES DRAWN AEROBIC AND ANAEROBIC Blood Culture  results may not be optimal due to an excessive volume of blood received in culture bottles   Culture   Final    NO GROWTH 2 DAYS Performed at [facility], [address] [place], [place] [address]    Report Status PENDING  Incomplete  Aerobic/Anaerobic Culture (surgical/deep wound)     Status: None (Preliminary result)   Collection Time: 05/29/17  8:00 AM  Result Value Ref Range Status   Specimen Description   Final    ABSCESS Performed at [facility], [address] [place], [place] [address]    Special Requests   Final    Normal Performed at [facility], [address] [place], [place] [address]    Gram Stain   Final    RARE WBC PRESENT, PREDOMINANTLY PMN NO ORGANISMS SEEN    Culture   Final    NO GROWTH 1 DAY Performed at [facility], [address] [place], [place] [address]    Report Status PENDING  Incomplete  C difficile quick scan w PCR reflex     Status: None   Collection Time: 05/30/17 11:50 AM  Result Value Ref Range Status   C Diff antigen NEGATIVE NEGATIVE Final   C Diff toxin NEGATIVE NEGATIVE Final   C Diff interpretation No C. difficile detected.  Final    Comment: Performed at [facility], [address] [place], [place] [address]     Studies: Ct Soft Tissue Neck Wo Contrast  Result Date: 05/28/2017 CLINICAL DATA:  Left submandibular swelling, redness, and firmness for 2 weeks. EXAM: CT NECK WITHOUT CONTRAST TECHNIQUE: Multidetector CT imaging of the neck was performed following the standard protocol without intravenous contrast. COMPARISON:  10/06/2013 FINDINGS: Pharynx and larynx: Symmetric pharyngeal soft tissues without evidence of mass. Unremarkable larynx. No retropharyngeal fluid collection. Salivary glands: The large complex fluid collection extending inferiorly from the right submandibular space on the prior CT is no longer present. There is a punctate calcification in the anterior right  submandibular gland without evidence of acute inflammation. There is new prominent inflammatory change in the left submandibular space with suspected phlegmon extending anteriorly from and inseparable from the left submandibular gland. There may be a punctate calcification near the anteroinferior aspect of the left submandibular gland, however no submandibular ductal stones or dilatation is seen. The left platysma is thickened, and inflammatory reticulation extends into the overlying subcutaneous fat with associated skin thickening. No organized fluid collection  is identified on this unenhanced study. The parotid glands are unremarkable. Thyroid: The subcentimeter left thyroid nodule on the prior study is not as well seen on the current examination and may have decreased in size. A 10 mm right thyroid nodule is more conspicuous on the current examination and may have slightly enlarged. Lymph nodes: Small left level IB and II lymph nodes are slightly larger than on the prior study, measuring up to 7 mm in short axis and likely reactive a 6 mm left level III lymph node is unchanged. Right level IB lymph nodes measure up to 8 mm in short axis. Vascular: The aortic arch atherosclerosis. Incidental average right subclavian artery, a normal variant. Mild calcified atherosclerosis at the right greater than left carotid bifurcations. Limited intracranial: Unremarkable. Visualized orbits: Bilateral cataract extraction. Mastoids and visualized paranasal sinuses: Clear Skeleton: All mandibular teeth are absent. Mild cervical disc degeneration. Upper chest: 3 mm apical right upper lobe nodule (series 5, image 90, previously 1.5 mm). New 2 mm anterior right upper lobe nodule (series 5, image 104). Increased size of a lobular left upper lobe nodule measuring 14 x 12 mm (mean 13 mm, series 5, image 101, previously 11 x 9 mm). Other: None. IMPRESSION: 1. Prominent inflammation centered in the left submandibular space favored to  reflect acute submandibular sialadenitis. No obstructing ductal stone identified. No organized fluid collection identified on this unenhanced study. 2. Interval resolution of complex right submandibular space collection on the 2015 CT. 3. Mild bilateral anterior cervical lymph node enlargement, most likely reactive. 4. Mild enlargement of a 13 mm left upper lobe lung nodule which remains concerning for primary bronchogenic carcinoma. Recommend further evaluation with PET-CT. 5. Mild enlargement of a 3 mm right upper lobe lung nodule. New 2 mm right upper lobe nodule. Electronically Signed   By: Sebastian Ache M.D.   On: 05/28/2017 15:01    Scheduled Meds: . dexamethasone  4 mg Intravenous Q12H  . feeding supplement (GLUCERNA SHAKE)  237 mL Oral TID WC  . heparin  5,000 Units Subcutaneous Q8H  . insulin aspart  0-15 Units Subcutaneous TID WC  . insulin aspart  0-5 Units Subcutaneous QHS  . insulin aspart  3 Units Subcutaneous TID WC  . [START ON 05/31/2017] insulin glargine  15 Units Subcutaneous Daily  . mupirocin ointment   Topical Daily  . potassium chloride  40 mEq Oral BID  . simvastatin  40 mg Oral Daily   Continuous Infusions: . piperacillin-tazobactam (ZOSYN)  IV 3.375 g (05/30/17 1036)  . vancomycin 1,000 mg (05/30/17 1415)    Assessment/Plan:  1. Abscess left neck.  On vancomycin and Zosyn.  Culture sent off.   Taper the dose of Decadron.  Appreciate ENT consultation. 2. Acute kidney injury improved with 3. Hypokalemia.  Maxide is not a good medication for this patient.  Potassium replaced 4. type 2 diabetes mellitus.  Hemoglobin A1c is actually 7.0.  Sugars are up secondary to steroids.  Compensating with Lantus and short-acting insulin. 5. Nonhealing wound of the left leg.  We will get wound care consultation and likely will need wound care center as outpatient. 6. Hyperlipidemia unspecified on simvastatin 7. Essential hypertension.  Blood pressure seems okay and we are holding  medications at this point 8. Anemia.  Ferritin 177. 9. Urinalysis looks positive.  Still do not see urine culture yet.  Code Status:     Code Status Orders  (From admission, onward)        Start  Ordered   05/28/17 1638  Full code  Continuous     05/28/17 1637    Code Status History    Date Active Date Inactive Code Status Order ID Comments User Context   This patient has a current code status but no historical code status.     Family Communication: Husband at the bedside Disposition Plan: Likely will need a few days of IV antibiotics  Consultants:  ENT  Antibiotics:  Vancomycin  Zosyn  Time spent: 25 minutes  Giovonnie Trettel [employer]

## 2017-05-30 NOTE — Care Management Important Message (Signed)
Important Message  Patient Details  Name: Tiffany Manning MRN: [id number] Date of Birth: 09/02/1940   Medicare Important Message Given:  Yes    Gwenette GreetBrenda S Sherlin Sonier, RN 05/30/2017, 7:07 AM

## 2017-05-30 NOTE — Progress Notes (Signed)
Inpatient Diabetes Program Recommendations  AACE/ADA: New Consensus Statement on Inpatient Glycemic Control (2015)  Target Ranges:  Prepandial:   less than 140 mg/dL      Peak postprandial:   less than 180 mg/dL (1-2 hours)      Critically ill patients:  140 - 180 mg/dL   Results for Tiffany Manning, Tiffany Manning (MRN [id number]) as of 05/30/2017 10:19  Ref. Range 05/29/2017 07:45 05/29/2017 11:53 05/29/2017 16:44 05/29/2017 21:36 05/30/2017 07:40  Glucose-Capillary Latest Ref Range: 65 - 99 mg/dL 742305 (H) 595394 (H) 638345 (H) 440 (H) 331 (H)   Review of Glycemic Control  Diabetes history: DM2 Outpatient Diabetes medications: Metaglip 5-500 mg BID Current orders for Inpatient glycemic control: Lantus 10 units daily, Novolog 0-15 units TID with meals, Novolog 0-5 units QHS  Inpatient Diabetes Program Recommendations: Insulin - Basal: If Decadron is continued as ordered, please consider increasing Lantus to 20 units daily. Insulin - Meal Coverage: If Decadron is continued as ordered, please consider ordering Novolog 5 units TID with meals for meal coverage if patient eats at least 50% of meals. HgbA1C: A1C in process.  Thanks, Orlando PennerMarie Shauntavia Brackin, RN, MSN, CDE Diabetes Coordinator Inpatient Diabetes Program [phone] (Team Pager from 8am to 5pm)

## 2017-05-30 NOTE — Consult Note (Signed)
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  Seen by PCP.  Atypical in appearance.  Wound type:unknown etiology Pressure Injury POA: NA Measurement: 3.6 cm x 2.4 cm x 0.2 cm  Wound ZOX:WRUEbed:pale pink nongranulating Drainage (amount, consistency, odor) minimal serosanguinous no odor Periwound:dry skin Dressing procedure/placement/frequency:Cleanse wound to left lateral leg with NS and pat gently dry.  Apply pea sized amount mupirocin ointment.  Cover with silicone border foam dressing. Change daily.  Will not follow at this time.  Please re-consult if needed.  Maple HudsonKaren Danaja Lasota RN BSN CWON Pager [phone]

## 2017-05-31 LAB — GLUCOSE, CAPILLARY
GLUCOSE-CAPILLARY: 351 mg/dL — AB (ref 65–99)
GLUCOSE-CAPILLARY: 212 mg/dL — AB (ref 65–99)
Glucose-Capillary: 210 mg/dL — ABNORMAL HIGH (ref 65–99)
Glucose-Capillary: 225 mg/dL — ABNORMAL HIGH (ref 65–99)

## 2017-05-31 MED ORDER — PROMETHAZINE HCL 25 MG/ML IJ SOLN
12.5000 mg | Freq: Four times a day (QID) | INTRAMUSCULAR | Status: DC | PRN
  Administered 2017-05-31 – 2017-06-02 (×4): 12.5 mg via INTRAVENOUS
  Filled 2017-05-31 (×4): qty 1

## 2017-05-31 MED ORDER — INSULIN GLARGINE 100 UNIT/ML ~~LOC~~ SOLN
10.0000 [IU] | Freq: Every day | SUBCUTANEOUS | Status: DC
  Administered 2017-06-01 – 2017-06-06 (×6): 10 [IU] via SUBCUTANEOUS
  Filled 2017-05-31 (×7): qty 0.1

## 2017-05-31 NOTE — Progress Notes (Signed)
Patient ID: Tiffany Manning, female   DOB: December 03, 1940, 77 y.o.   MRN: [id number]   [employer] PROGRESS NOTE  Baker Kogler WUJ:[id number] DOB: 1940-09-24 DOA: 05/28/2017 PCP: Jaclyn Shaggy, MD  HPI/Subjective: Patient feeling nauseous.  Had diarrhea overnight.  Not feeling well.  Still having pain in the abscess in the neck.  Objective: Vitals:   05/31/17 0805 05/31/17 1302  BP: (!) 147/63 133/84  Pulse: 97 (!) 58  Resp: 18 18  Temp: 97.7 F (36.5 C) 97.9 F (36.6 C)  SpO2: 99% 96%    Filed Weights   05/28/17 1158  Weight: 58.1 kg (128 lb)    ROS: Review of Systems  Constitutional: Negative for chills and fever.  Eyes: Negative for blurred vision.  Respiratory: Negative for cough and shortness of breath.   Cardiovascular: Negative for chest pain.  Gastrointestinal: Positive for diarrhea. Negative for abdominal pain, constipation, nausea and vomiting.  Genitourinary: Negative for dysuria.  Musculoskeletal: Negative for joint pain.  Neurological: Negative for dizziness and headaches.   Exam: Physical Exam  HENT:  Nose: No mucosal edema.  Mouth/Throat: No oropharyngeal exudate or posterior oropharyngeal edema.  Eyes: Conjunctivae, EOM and lids are normal. Pupils are equal, round, and reactive to light.  Neck: No JVD present. Carotid bruit is not present. No edema present. No thyroid mass and no thyromegaly present.  Large abscess left neck.  Cardiovascular: S1 normal and S2 normal. Exam reveals no gallop.  No murmur heard. Pulses:      Dorsalis pedis pulses are 2+ on the right side, and 2+ on the left side.  Respiratory: No respiratory distress. She has no wheezes. She has no rhonchi. She has no rales.  GI: Soft. Bowel sounds are normal. There is no tenderness.  Musculoskeletal:       Right shoulder: She exhibits no swelling.  Lymphadenopathy:    She has no cervical adenopathy.  Neurological: She is alert. No cranial nerve deficit.  Skin: Skin is warm. No rash  noted. Nails show no clubbing.  Large nonhealing wound lateral left leg.  Psychiatric: She has a normal mood and affect.      Data Reviewed: Basic Metabolic Panel: Recent Labs  Lab 05/28/17 1200 05/28/17 1738 05/29/17 0446 05/29/17 1551 05/30/17 0447  NA 135  --  137  --  138  K 3.4*  --  2.6* 3.9 3.7  CL 104  --  111  --  111  CO2 10*  --  12*  --  17*  GLUCOSE 225*  --  357*  --  295*  BUN 64*  --  52*  --  37*  CREATININE 2.09* 1.87* 1.46*  --  1.23*  CALCIUM 9.2  --  8.2*  --  8.8*  MG  --   --   --   --  2.0   Liver Function Tests: Recent Labs  Lab 05/28/17 1200  AST 19  ALT 10*  ALKPHOS 81  BILITOT 0.6  PROT 8.3*  ALBUMIN 3.3*   CBC: Recent Labs  Lab 05/28/17 1200 05/29/17 0446 05/30/17 0447  WBC 16.8* 10.2 11.9*  NEUTROABS 14.2*  --   --   HGB 10.5* 9.1* 9.1*  HCT 32.5* 27.7* 28.2*  MCV 96.4 96.0 94.9  PLT 452* 376 403    CBG: Recent Labs  Lab 05/30/17 1225 05/30/17 1701 05/30/17 2121 05/31/17 0729 05/31/17 1132  GLUCAP 396* 421* 345* 351* 212*    Recent Results (from the past 240 hour(s))  Blood  Culture (routine x 2)     Status: None (Preliminary result)   Collection Time: 05/28/17  2:03 PM  Result Value Ref Range Status   Specimen Description BLOOD LAC  Final   Special Requests   Final    BOTTLES DRAWN AEROBIC AND ANAEROBIC Blood Culture results may not be optimal due to an excessive volume of blood received in culture bottles   Culture   Final    NO GROWTH 3 DAYS Performed at [facility], [address] [place], [place] [address]    Report Status PENDING  Incomplete  Blood Culture (routine x 2)     Status: None (Preliminary result)   Collection Time: 05/28/17  2:08 PM  Result Value Ref Range Status   Specimen Description BLOOD LFOA  Final   Special Requests   Final    BOTTLES DRAWN AEROBIC AND ANAEROBIC Blood Culture results may not be optimal due to an excessive volume of blood received in culture bottles   Culture    Final    NO GROWTH 3 DAYS Performed at [facility], [address], [place], [place] [address]    Report Status PENDING  Incomplete  Aerobic/Anaerobic Culture (surgical/deep wound)     Status: None (Preliminary result)   Collection Time: 05/29/17  8:00 AM  Result Value Ref Range Status   Specimen Description   Final    ABSCESS Performed at [facility], [address], [place], [place] [address]    Special Requests   Final    Normal Performed at [facility], [address] [place], [place] [address]    Gram Stain   Final    RARE WBC PRESENT, PREDOMINANTLY PMN NO ORGANISMS SEEN    Culture   Final    NO GROWTH 2 DAYS Performed at [facility], [address], [place], [place] [address]    Report Status PENDING  Incomplete  C difficile quick scan w PCR reflex     Status: None   Collection Time: 05/30/17 11:50 AM  Result Value Ref Range Status   C Diff antigen NEGATIVE NEGATIVE Final   C Diff toxin NEGATIVE NEGATIVE Final   C Diff interpretation No C. difficile detected.  Final    Comment: Performed at [facility], [address] [place], [place] [address]      Scheduled Meds: . feeding supplement (GLUCERNA SHAKE)  237 mL Oral TID WC  . heparin  5,000 Units Subcutaneous Q8H  . insulin aspart  0-15 Units Subcutaneous TID WC  . insulin aspart  0-5 Units Subcutaneous QHS  . insulin aspart  3 Units Subcutaneous TID WC  . [START ON 06/01/2017] insulin glargine  10 Units Subcutaneous Daily  . mupirocin ointment   Topical Daily  . potassium chloride  40 mEq Oral BID  . simvastatin  40 mg Oral Daily   Continuous Infusions: . piperacillin-tazobactam (ZOSYN)  IV Stopped (05/31/17 1208)  . vancomycin Stopped (05/30/17 1515)    Assessment/Plan:  1. Abscess left neck.  On vancomycin and Zosyn.  Culture negative.  Appreciate ENT consultation. 2. Acute kidney injury improved with IV fluids. 3. Hypokalemia.  Maxide is not a good  medication for this patient.  Potassium replaced.  4. type 2 diabetes mellitus.  Hemoglobin A1c is actually 7.0.  Sugars are up secondary to steroids.  Compensating with Lantus and short-acting insulin. 5. Nonhealing wound of the left leg.  Appreciate wound care consultation.  Will likely need wound care  center as outpatient. 6. Hyperlipidemia unspecified on simvastatin 7. Essential hypertension.  Blood pressure seems okay and we are holding medications at this point 8. Anemia 9. Urinalysis looks positive.  Antibiotics would cover.  Code Status:     Code Status Orders  (From admission, onward)        Start     Ordered   05/28/17 1638  Full code  Continuous     05/28/17 1637    Code Status History    Date Active Date Inactive Code Status Order ID Comments User Context   This patient has a current code status but no historical code status.     Family Communication: Husband at the bedside Disposition Plan: Continue IV antibiotics for now  Consultants:  ENT  Antibiotics:  Vancomycin  Zosyn  Time spent: 24 minutes  Walaa [employer]l [employer]  [employer]

## 2017-05-31 NOTE — Progress Notes (Signed)
Patient has no acute event overnight. She continues to c/o nausea, now have 2 new  PRN order for nausea.

## 2017-05-31 NOTE — Plan of Care (Signed)
  Progressing Education: Knowledge of General Education information will improve 05/31/2017 0639 - Progressing by Kerrie BuffaloBakare, Ole Lafon Muili, RN Health Behavior/Discharge Planning: Ability to manage health-related needs will improve 05/31/2017 0639 - Progressing by Kerrie BuffaloBakare, Viveka Wilmeth Muili, RN Clinical Measurements: Ability to maintain clinical measurements within normal limits will improve 05/31/2017 0639 - Progressing by Kerrie BuffaloBakare, Kellsie Grindle Muili, RN Will remain free from infection 05/31/2017 0639 - Progressing by Kerrie BuffaloBakare, Maeven Mcdougall Muili, RN Diagnostic test results will improve 05/31/2017 [phone] - Progressing by Kerrie BuffaloBakare, Kristinia Leavy Muili, RN Respiratory complications will improve 05/31/2017 0639 - Progressing by Kerrie BuffaloBakare, Adryan Shin Muili, RN Cardiovascular complication will be avoided 05/31/2017 [id number] - Progressing by Kerrie BuffaloBakare, Bailynn Dyk Muili, RN Activity: Risk for activity intolerance will decrease 05/31/2017 0639 - Progressing by Kerrie BuffaloBakare, Shaena Parkerson Muili, RN Nutrition: Adequate nutrition will be maintained 05/31/2017 0639 - Progressing by Kerrie BuffaloBakare, Giovanni Bath Muili, RN Coping: Level of anxiety will decrease 05/31/2017 0639 - Progressing by Kerrie BuffaloBakare, Narvel Kozub Muili, RN Elimination: Will not experience complications related to bowel motility 05/31/2017 0639 - Progressing by Kerrie BuffaloBakare, Levette Paulick Muili, RN Will not experience complications related to urinary retention 05/31/2017 0639 - Progressing by Kerrie BuffaloBakare, Ratasha Fabre Muili, RN Pain Managment: General experience of comfort will improve 05/31/2017 0639 - Progressing by Kerrie BuffaloBakare, Loleta Frommelt Muili, RN Safety: Ability to remain free from injury will improve 05/31/2017 0639 - Progressing by Kerrie BuffaloBakare, Tahni Porchia Muili, RN Skin Integrity: Risk for impaired skin integrity will decrease 05/31/2017 0639 - Progressing by Kerrie BuffaloBakare, Khadeem Rockett Muili, RN

## 2017-05-31 NOTE — Progress Notes (Signed)
PT Cancellation Note  Patient Details Name: Tiffany LangeBarbara Sposito MRN: [id number] DOB: 07/15/1940   Cancelled Treatment:    Reason Eval/Treat Not Completed: Other (comment).  PT consult received.  Chart reviewed.  Pt reporting getting up to go to bathroom today.  Pt declining physical therapy at this time d/t weakness and nausea; nursing notified.  Will re-attempt PT evaluation at a later date/time.  Hendricks LimesEmily Nisa Decaire, PT 05/31/17, 3:44 PM [phone]

## 2017-06-01 LAB — CBC
HEMATOCRIT: 29.7 % — AB (ref 35.0–47.0)
HEMOGLOBIN: 9.8 g/dL — AB (ref 12.0–16.0)
MCH: 31 pg (ref 26.0–34.0)
MCHC: 33 g/dL (ref 32.0–36.0)
MCV: 94.1 fL (ref 80.0–100.0)
Platelets: 362 10*3/uL (ref 150–440)
RBC: 3.16 MIL/uL — AB (ref 3.80–5.20)
RDW: 14.2 % (ref 11.5–14.5)
WBC: 13.1 10*3/uL — AB (ref 3.6–11.0)

## 2017-06-01 LAB — BASIC METABOLIC PANEL
ANION GAP: 14 (ref 5–15)
BUN: 30 mg/dL — ABNORMAL HIGH (ref 6–20)
CHLORIDE: 105 mmol/L (ref 101–111)
CO2: 19 mmol/L — AB (ref 22–32)
Calcium: 8.3 mg/dL — ABNORMAL LOW (ref 8.9–10.3)
Creatinine, Ser: 1.51 mg/dL — ABNORMAL HIGH (ref 0.44–1.00)
GFR calc Af Amer: 37 mL/min — ABNORMAL LOW (ref >60)
GFR calc non Af Amer: 32 mL/min — ABNORMAL LOW (ref >60)
GLUCOSE: 309 mg/dL — AB (ref 65–99)
POTASSIUM: 3.2 mmol/L — AB (ref 3.5–5.1)
Sodium: 138 mmol/L (ref 135–145)

## 2017-06-01 LAB — GLUCOSE, CAPILLARY
GLUCOSE-CAPILLARY: 287 mg/dL — AB (ref 65–99)
GLUCOSE-CAPILLARY: 241 mg/dL — AB (ref 65–99)
Glucose-Capillary: 306 mg/dL — ABNORMAL HIGH (ref 65–99)
Glucose-Capillary: 205 mg/dL — ABNORMAL HIGH (ref 65–99)

## 2017-06-01 LAB — MAGNESIUM: Magnesium: 1.5 mg/dL — ABNORMAL LOW (ref 1.7–2.4)

## 2017-06-01 LAB — VANCOMYCIN, TROUGH: Vancomycin Tr: 20 ug/mL (ref 15–20)

## 2017-06-01 MED ORDER — POTASSIUM CHLORIDE 20 MEQ PO PACK
40.0000 meq | PACK | Freq: Two times a day (BID) | ORAL | Status: DC
  Administered 2017-06-01 – 2017-06-02 (×2): 40 meq via ORAL
  Filled 2017-06-01 (×5): qty 2

## 2017-06-01 MED ORDER — MAGNESIUM SULFATE 4 GM/100ML IV SOLN
4.0000 g | Freq: Once | INTRAVENOUS | Status: AC
  Administered 2017-06-01: 22:00:00 4 g via INTRAVENOUS
  Filled 2017-06-01: qty 100

## 2017-06-01 MED ORDER — POTASSIUM CHLORIDE CRYS ER 20 MEQ PO TBCR
40.0000 meq | EXTENDED_RELEASE_TABLET | ORAL | Status: AC
  Administered 2017-06-01 (×2): 40 meq via ORAL
  Filled 2017-06-01 (×2): qty 2

## 2017-06-01 MED ORDER — INSULIN ASPART 100 UNIT/ML ~~LOC~~ SOLN
0.0000 [IU] | Freq: Three times a day (TID) | SUBCUTANEOUS | Status: DC
  Administered 2017-06-01: 18:00:00 5 [IU] via SUBCUTANEOUS
  Administered 2017-06-02: 12:00:00 7 [IU] via SUBCUTANEOUS
  Administered 2017-06-02: 9 [IU] via SUBCUTANEOUS
  Administered 2017-06-02: 2 [IU] via SUBCUTANEOUS
  Administered 2017-06-03: 09:00:00 3 [IU] via SUBCUTANEOUS
  Administered 2017-06-03 – 2017-06-04 (×2): 2 [IU] via SUBCUTANEOUS
  Administered 2017-06-04: 1 [IU] via SUBCUTANEOUS
  Administered 2017-06-04: 17:00:00 2 [IU] via SUBCUTANEOUS
  Filled 2017-06-01 (×10): qty 1

## 2017-06-01 NOTE — Plan of Care (Signed)
Tachycardic during shift, manual recheck WDL.  VSS otherwise.  Reported nausea, improved w/ PRN IV Zofran 4mg .  Reported HA 10/10, received PRN PO Tylenol 650mg , 10/10 at recheck, but pt reported pain "improving."  PIV infiltrated, new PIV placed by IV team.  No other needs overnight.  Bed in low position, call bell within reach.  WCTM.

## 2017-06-01 NOTE — Progress Notes (Signed)
Pharmacy Antibiotic Note  Charlann LangeBarbara Manning is a 77 y.o. female admitted on 05/28/2017 with sialoadenitis.  Pharmacy has been consulted for vancomycin and Zosyn dosing. Pt also with AKI.  Plan: - adjust Zosyn to  3.375 g IV q8h EI for Crcl now > 20 ml/min.  Pt received vancomycin 1 g IV in the ED at 1427, which provides loading dose of 17.2 mg/kg. Current PK suggests dosing of vancomycin 1000 mg IV q48 h for goal trough 15-20 mcg/ml (ordered for now to start 1/23 at 1400). Pt with AKI; will need to monitor renal function closely and adjust dosing. Will see what BMET looks like in AM and adjust dose/plan as warranted. May need to dose off of levels.  Ke 0.018, half life 38.51, Vd 40.7 L  1/22:  Vancomycin random level 24 hours after 1st dose= 10 mcg/ml. (Renal fxn improved Scr 2.09 >> 1.46). Will adjust dosing to Vancomycin 1000 mg IV Q36h. Will give Vancomycin 1000 mg x1 then follow with stacked dosing.   Ke 0.024  t1/2 28.8  Vd 40.6   Anticipate renal fxn may continue to improve. Follow up to schedule Vancomycin trough.  Watch Zosyn/Vancomycin and renal fxn.   1/25 0000 vanc level 20. Continue current regimen. F/u as clinically appropriate.     Height: 5' (152.4 cm) Weight: 128 lb (58.1 kg) IBW/kg (Calculated) : 45.5  Temp (24hrs), Avg:97.9 F (36.6 C), Min:97.6 F (36.4 C), Max:98.3 F (36.8 C)  Recent Labs  Lab 05/28/17 1200 05/28/17 1342 05/28/17 1738 05/29/17 0446 05/29/17 1346 05/30/17 0447 06/01/17 0012  WBC 16.8*  --   --  10.2  --  11.9*  --   CREATININE 2.09*  --  1.87* 1.46*  --  1.23*  --   LATICACIDVEN  --  1.2  --   --   --   --   --   VANCOTROUGH  --   --   --   --   --   --  20  VANCORANDOM  --   --   --   --  10  --   --     Estimated Creatinine Clearance: 30.5 mL/min (A) (by C-G formula based on SCr of 1.23 mg/dL (H)).    Allergies  Allergen Reactions  . Sulfa Antibiotics Nausea And Vomiting    Antimicrobials this admission: Vanc/Zosyn 1/21  >>  Dose adjustments this admission: 1/22  Random Vanc= 10 mcg/ml (24 hrs after 1st dose): change Vanc 1 gram q48 to 1 gram q36h  Microbiology results: 1/21 BCx: sent x2   Thank you for allowing pharmacy to be a part of this patient's care.  Johntavious Francom S 06/01/2017 12:55 AM

## 2017-06-01 NOTE — Consult Note (Addendum)
MEDICATION RELATED CONSULT NOTE - INITIAL   Pharmacy Consult for electrolyes Indication: hypokalemia  Allergies  Allergen Reactions  . Sulfa Antibiotics Nausea And Vomiting    Patient Measurements: Height: 5' (152.4 cm) Weight: 128 lb (58.1 kg) IBW/kg (Calculated) : 45.5 Adjusted Body Weight:   Vital Signs: Temp: 97.9 F (36.6 C) (01/25 0448) Temp Source: Oral (01/25 0448) BP: 131/62 (01/25 0448) Pulse Rate: 90 (01/25 0500) Intake/Output from previous day: 01/24 0701 - 01/25 0700 In: 660 [P.O.:360; IV Piggyback:300] Out: 700 [Urine:700] Intake/Output from this shift: Total I/O In: 240 [P.O.:240] Out: -   Labs: Recent Labs    05/30/17 0447 06/01/17 0650  WBC 11.9* 13.1*  HGB 9.1* 9.8*  HCT 28.2* 29.7*  PLT 403 362  CREATININE 1.23* 1.51*  MG 2.0  --    Estimated Creatinine Clearance: 24.9 mL/min (A) (by C-G formula based on SCr of 1.51 mg/dL (H)).   Microbiology: Recent Results (from the past 720 hour(s))  Blood Culture (routine x 2)     Status: None (Preliminary result)   Collection Time: 05/28/17  2:03 PM  Result Value Ref Range Status   Specimen Description BLOOD LAC  Final   Special Requests   Final    BOTTLES DRAWN AEROBIC AND ANAEROBIC Blood Culture results may not be optimal due to an excessive volume of blood received in culture bottles   Culture   Final    NO GROWTH 4 DAYS Performed at [facility], [address] [place], [place] [address]    Report Status PENDING  Incomplete  Blood Culture (routine x 2)     Status: None (Preliminary result)   Collection Time: 05/28/17  2:08 PM  Result Value Ref Range Status   Specimen Description BLOOD LFOA  Final   Special Requests   Final    BOTTLES DRAWN AEROBIC AND ANAEROBIC Blood Culture results may not be optimal due to an excessive volume of blood received in culture bottles   Culture   Final    NO GROWTH 4 DAYS Performed at [facility], [address] [place], [place]  [address]    Report Status PENDING  Incomplete  Aerobic/Anaerobic Culture (surgical/deep wound)     Status: None (Preliminary result)   Collection Time: 05/29/17  8:00 AM  Result Value Ref Range Status   Specimen Description   Final    ABSCESS Performed at [facility], [address] [place], [place] [address]    Special Requests   Final    Normal Performed at [facility], [address] [place], [place] [address]    Gram Stain   Final    RARE WBC PRESENT, PREDOMINANTLY PMN NO ORGANISMS SEEN    Culture   Final    NO GROWTH 3 DAYS NO ANAEROBES ISOLATED; CULTURE IN PROGRESS FOR 5 DAYS Performed at [facility], [address] [place], [place] [address]    Report Status PENDING  Incomplete  C difficile quick scan w PCR reflex     Status: None   Collection Time: 05/30/17 11:50 AM  Result Value Ref Range Status   C Diff antigen NEGATIVE NEGATIVE Final   C Diff toxin NEGATIVE NEGATIVE Final   C Diff interpretation No C. difficile detected.  Final    Comment: Performed at [facility], [address] [place], [place] [address]    Medical History: Past Medical History:  Diagnosis Date  . Diabetes mellitus without complication (HCC)   . Hypertension  Medications:  Scheduled:  . feeding supplement (GLUCERNA SHAKE)  237 mL Oral TID WC  . heparin  5,000 Units Subcutaneous Q8H  . insulin aspart  0-5 Units Subcutaneous QHS  . insulin aspart  0-9 Units Subcutaneous TID WC  . insulin aspart  3 Units Subcutaneous TID WC  . insulin glargine  10 Units Subcutaneous Daily  . mupirocin ointment   Topical Daily  . potassium chloride  40 mEq Oral BID  . potassium chloride  40 mEq Oral Q4H  . simvastatin  40 mg Oral Daily    Assessment: Patient is a 77 year old female admitted with sialoadenitis. Pt was hypokalemic on admission. Has been on KCL 40 MEQ po BID for several days. K had been WNL but dropped again this AM to 3.2. Pt w/ nausea, 2 BM  yesterday but no diarrhea. Prime Dr has ordered an additional 40 MEQ x 2 doses on top of 40 BID.   Goal of Therapy:  Normalization of electrolytes  Plan:  I ordered an add on Mg level. Replace if low. Continue with orders from prime Dr. And follow up in the AM. Pt renal function is relatively unstable. Therefore will replace somewhat cautiously.  Add on Mg was 1.5. Will give Mg 4g IV once. Recheck in the AM  [employer], Pharm.D, BCPS Clinical Pharmacist 06/01/2017,3:21 PM

## 2017-06-01 NOTE — Progress Notes (Signed)
Pharmacy Antibiotic Note  Tiffany LangeBarbara Manning is a 77 y.o. female admitted on 05/28/2017 with sialoadenitis.  Pharmacy has been consulted for vancomycin and Zosyn dosing. Pt also with AKI.  This is day #5 of IV antibiotics.   Plan: Continue piperacillin/tazobactam 3.375 g IV q8h EI  Vancomycin level = 20 mcg/mL obtained last night was upper end of target range of 15-20 mcg/mL, but prior to steady state on vancomycin 1000 mg IV q36h. Additionally, SCr has increased slightly - anticipate needing a q48h regimen.  Will discontinue scheduled vancomycin regimen and check a vancomycin level 48 hours post dose. Redose if vancomycin level < 15-20 mcg/mL.  Height: 5' (152.4 cm) Weight: 128 lb (58.1 kg) IBW/kg (Calculated) : 45.5  Temp (24hrs), Avg:98.1 F (36.7 C), Min:97.9 F (36.6 C), Max:98.3 F (36.8 C)  Recent Labs  Lab 05/28/17 1200 05/28/17 1342 05/28/17 1738 05/29/17 0446 05/29/17 1346 05/30/17 0447 06/01/17 0012 06/01/17 0650  WBC 16.8*  --   --  10.2  --  11.9*  --  13.1*  CREATININE 2.09*  --  1.87* 1.46*  --  1.23*  --  1.51*  LATICACIDVEN  --  1.2  --   --   --   --   --   --   VANCOTROUGH  --   --   --   --   --   --  20  --   VANCORANDOM  --   --   --   --  10  --   --   --     Estimated Creatinine Clearance: 24.9 mL/min (A) (by C-G formula based on SCr of 1.51 mg/dL (H)).    Allergies  Allergen Reactions  . Sulfa Antibiotics Nausea And Vomiting    Antimicrobials this admission: Vancomycin + piperacillin/tazobactam 1/21 >>  Dose adjustments this admission: 1/22  Random Vanc= 10 mcg/ml (24 hrs after 1st dose): change Vanc 1 gram q48 to 1 gram q36h 1/25 Vancomycin 1000 mg IV q36 - will d/c regimen and check random level  Microbiology results: 1/21 BCx: No growth x 4 days  Thank you for allowing pharmacy to be a part of this patient's care.  Cindi CarbonMary M Aamira Manning, PharmD, BCPS Clinical Pharmacist 06/01/2017 1:55 PM

## 2017-06-01 NOTE — Progress Notes (Signed)
Patient ID: Tiffany Manning, female   DOB: 11/02/1940, 77 y.o.   MRN: [id number]   [employer] PROGRESS NOTE  Ziaire Bieser WUJ:[id number] DOB: [birth year] DOA: 05/28/2017 PCP: Jaclyn Shaggy, MD  HPI/Subjective: Patient feeling weak.  Had diarrhea overnight.  Still having pain in the abscess in the neck.  Objective: Vitals:   06/01/17 0448 06/01/17 0500  BP: 131/62   Pulse: (!) 153 90  Resp: 20   Temp: 97.9 F (36.6 C)   SpO2: 95%     Filed Weights   05/28/17 1158  Weight: 58.1 kg (128 lb)    ROS: Review of Systems  Constitutional: Negative for chills and fever.  Eyes: Negative for blurred vision.  Respiratory: Negative for cough and shortness of breath.   Cardiovascular: Negative for chest pain.  Gastrointestinal: Positive for diarrhea. Negative for abdominal pain, constipation, nausea and vomiting.  Genitourinary: Negative for dysuria.  Musculoskeletal: Negative for joint pain.  Neurological: Negative for dizziness and headaches.   Exam: Physical Exam  HENT:  Nose: No mucosal edema.  Mouth/Throat: No oropharyngeal exudate or posterior oropharyngeal edema.  Eyes: Conjunctivae, EOM and lids are normal. Pupils are equal, round, and reactive to light.  Neck: No JVD present. Carotid bruit is not present. No edema present. No thyroid mass and no thyromegaly present.  Large abscess left neck.  Cardiovascular: S1 normal and S2 normal. Exam reveals no gallop.  No murmur heard. Pulses:      Dorsalis pedis pulses are 2+ on the right side, and 2+ on the left side.  Respiratory: No respiratory distress. She has no wheezes. She has no rhonchi. She has no rales.  GI: Soft. Bowel sounds are normal. There is no tenderness.  Musculoskeletal:       Right shoulder: She exhibits no swelling.  Lymphadenopathy:    She has no cervical adenopathy.  Neurological: She is alert. No cranial nerve deficit.  Skin: Skin is warm. No rash noted. Nails show no clubbing.  Large nonhealing wound  lateral left leg.  Psychiatric: She has a normal mood and affect.      Data Reviewed: Basic Metabolic Panel: Recent Labs  Lab 05/28/17 1200 05/28/17 1738 05/29/17 0446 05/29/17 1551 05/30/17 0447 06/01/17 0650  NA 135  --  137  --  138 138  K 3.4*  --  2.6* 3.9 3.7 3.2*  CL 104  --  111  --  111 105  CO2 10*  --  12*  --  17* 19*  GLUCOSE 225*  --  357*  --  295* 309*  BUN 64*  --  52*  --  37* 30*  CREATININE 2.09* 1.87* 1.46*  --  1.23* 1.51*  CALCIUM 9.2  --  8.2*  --  8.8* 8.3*  MG  --   --   --   --  2.0  --    Liver Function Tests: Recent Labs  Lab 05/28/17 1200  AST 19  ALT 10*  ALKPHOS 81  BILITOT 0.6  PROT 8.3*  ALBUMIN 3.3*   CBC: Recent Labs  Lab 05/28/17 1200 05/29/17 0446 05/30/17 0447 06/01/17 0650  WBC 16.8* 10.2 11.9* 13.1*  NEUTROABS 14.2*  --   --   --   HGB 10.5* 9.1* 9.1* 9.8*  HCT 32.5* 27.7* 28.2* 29.7*  MCV 96.4 96.0 94.9 94.1  PLT 452* 376 403 362    CBG: Recent Labs  Lab 05/31/17 1132 05/31/17 1652 05/31/17 2048 06/01/17 0737 06/01/17 1140  GLUCAP 212* 210*  225* 306* 205*    Recent Results (from the past 240 hour(s))  Blood Culture (routine x 2)     Status: None (Preliminary result)   Collection Time: 05/28/17  2:03 PM  Result Value Ref Range Status   Specimen Description BLOOD LAC  Final   Special Requests   Final    BOTTLES DRAWN AEROBIC AND ANAEROBIC Blood Culture results may not be optimal due to an excessive volume of blood received in culture bottles   Culture   Final    NO GROWTH 4 DAYS Performed at [facility], [address] [place], [place] [address]    Report Status PENDING  Incomplete  Blood Culture (routine x 2)     Status: None (Preliminary result)   Collection Time: 05/28/17  2:08 PM  Result Value Ref Range Status   Specimen Description BLOOD LFOA  Final   Special Requests   Final    BOTTLES DRAWN AEROBIC AND ANAEROBIC Blood Culture results may not be optimal due to an excessive volume  of blood received in culture bottles   Culture   Final    NO GROWTH 4 DAYS Performed at [facility], [address] [place], [place] [address]    Report Status PENDING  Incomplete  Aerobic/Anaerobic Culture (surgical/deep wound)     Status: None (Preliminary result)   Collection Time: 05/29/17  8:00 AM  Result Value Ref Range Status   Specimen Description   Final    ABSCESS Performed at [facility], [address] [place], [place] [address]    Special Requests   Final    Normal Performed at [facility], [address] [place], [place] [address]    Gram Stain   Final    RARE WBC PRESENT, PREDOMINANTLY PMN NO ORGANISMS SEEN    Culture   Final    NO GROWTH 3 DAYS NO ANAEROBES ISOLATED; CULTURE IN PROGRESS FOR 5 DAYS Performed at [facility], [address] [place], [place] [address]    Report Status PENDING  Incomplete  C difficile quick scan w PCR reflex     Status: None   Collection Time: 05/30/17 11:50 AM  Result Value Ref Range Status   C Diff antigen NEGATIVE NEGATIVE Final   C Diff toxin NEGATIVE NEGATIVE Final   C Diff interpretation No C. difficile detected.  Final    Comment: Performed at [facility], [address] [place], [place] [address]      Scheduled Meds: . feeding supplement (GLUCERNA SHAKE)  237 mL Oral TID WC  . heparin  5,000 Units Subcutaneous Q8H  . insulin aspart  0-5 Units Subcutaneous QHS  . insulin aspart  0-9 Units Subcutaneous TID WC  . insulin aspart  3 Units Subcutaneous TID WC  . insulin glargine  10 Units Subcutaneous Daily  . mupirocin ointment   Topical Daily  . potassium chloride  40 mEq Oral BID  . potassium chloride  40 mEq Oral Q4H  . simvastatin  40 mg Oral Daily   Continuous Infusions: . piperacillin-tazobactam (ZOSYN)  IV Stopped (06/01/17 1458)    Assessment/Plan:  1. Abscess left neck.  On vancomycin and Zosyn.  Culture negative.  Appreciate ENT consultation,  recommending 36 hours of IV antibiotics prior to switching to oral antibiotics , spontaneously draining, continue warm compresses. Wound cultures are negative.  Blood cultures are negative. 2. Acute kidney injury improved with IV fluids.  Hypokalemia.  Maxide is not a good  medication for this patient.  Potassium replaced. 3. type 2 diabetes mellitus.  Hemoglobin A1c is actually 7.0.  Sugars are up secondary to steroids.  Compensating with Lantus and short-acting insulin. 4. Nonhealing wound of the left leg.  Appreciate wound care consultation.  Will likely need wound care center as outpatient. 5. Hyperlipidemia unspecified on simvastatin 6. Essential hypertension.  Blood pressure seems okay and we are holding medications at this point 7. Anemia 8. Urinalysis looks positive.  Antibiotics would cover. 9. Generalized weakness PT consult is pending  Code Status:     Code Status Orders  (From admission, onward)        Start     Ordered   05/28/17 1638  Full code  Continuous     05/28/17 1637    Code Status History    Date Active Date Inactive Code Status Order ID Comments User Context   This patient has a current code status but no historical code status.     Family Communication: Husband at the bedside Disposition Plan: Continue IV antibiotics for now  Consultants:  ENT  Antibiotics:  Vancomycin  Zosyn  Time spent: 24 minutes  Deanna Artis Lamona Eimer  [employer]

## 2017-06-01 NOTE — Progress Notes (Signed)
PT Cancellation Note  Patient Details Name: Tiffany Manning MRN: [id number] DOB: 02/29/1941   Cancelled Treatment:    Reason Eval/Treat Not Completed: Other (comment).  Pt resting in bed upon PT entry but woken easily when therapist started talking to pt's husband.  Pt reporting being drowsy from medication but also still nauseas and declined physical therapy d/t these symptoms.  Nursing aware and reporting pt with nausea this morning requiring medication; nurse also reports pt has been getting up to bathroom with SBA.  Will re-attempt PT evaluation at a later date/time.  Hendricks LimesEmily Davanee Klinkner, PT 06/01/17, 9:06 AM [phone]

## 2017-06-01 NOTE — Progress Notes (Addendum)
Inpatient Diabetes Program Recommendations  AACE/ADA: New Consensus Statement on Inpatient Glycemic Control (2015)  Target Ranges:  Prepandial:   less than 140 mg/dL      Peak postprandial:   less than 180 mg/dL (1-2 hours)      Critically ill patients:  140 - 180 mg/dL   Lab Results  Component Value Date   GLUCAP 205 (H) 06/01/2017   HGBA1C 7.0 (H) 05/30/2017    Review of Glycemic Control  Results for Charlann LangeGOLDEN, Annibelle (MRN [id number]) as of 06/01/2017 12:52  Ref. Range 05/31/2017 11:32 05/31/2017 16:52 05/31/2017 20:48 06/01/2017 07:37 06/01/2017 11:40  Glucose-Capillary Latest Ref Range: 65 - 99 mg/dL 409212 (H) 811210 (H) 914225 (H) 306 (H) 205 (H)   Diabetes history: DM2 Outpatient Diabetes medications: Metaglip 5-500 mg BID  Current orders for Inpatient glycemic control: Lantus 10 units daily, Novolog 0-15 units TID with meals, Novolog 0-5 units QHS, Novolog 3 units tid  Inpatient Diabetes Program Recommendations: Since the steroids have been stopped, please decrease correction insulin to the sensitive correction scale 0-9 units tid and decrease mealtime Novolog to 2 units tid.  To prevent hypoglycemia, may need to stop Novolog mealtime dose tomorrow (2 or 3 units)but continue sliding scale 0-9 units tid.  Susette RacerJulie Ersa Delaney, RN, BA, MHA, CDE Diabetes Coordinator Inpatient Diabetes Program  [phone] (Team Pager) [phone] [name] Office) 06/01/2017 12:58 PM

## 2017-06-02 LAB — BASIC METABOLIC PANEL
Anion gap: 14 (ref 5–15)
BUN: 25 mg/dL — AB (ref 6–20)
CALCIUM: 8.2 mg/dL — AB (ref 8.9–10.3)
CHLORIDE: 104 mmol/L (ref 101–111)
CO2: 21 mmol/L — ABNORMAL LOW (ref 22–32)
CREATININE: 1.48 mg/dL — AB (ref 0.44–1.00)
GFR, EST AFRICAN AMERICAN: 38 mL/min — AB (ref >60)
GFR, EST NON AFRICAN AMERICAN: 33 mL/min — AB (ref >60)
Glucose, Bld: 340 mg/dL — ABNORMAL HIGH (ref 65–99)
Potassium: 3.6 mmol/L (ref 3.5–5.1)
SODIUM: 139 mmol/L (ref 135–145)

## 2017-06-02 LAB — CULTURE, BLOOD (ROUTINE X 2)
CULTURE: NO GROWTH
CULTURE: NO GROWTH

## 2017-06-02 LAB — CBC
HCT: 32.3 % — ABNORMAL LOW (ref 35.0–47.0)
HEMOGLOBIN: 10.6 g/dL — AB (ref 12.0–16.0)
MCH: 31.2 pg (ref 26.0–34.0)
MCHC: 32.8 g/dL (ref 32.0–36.0)
MCV: 95.3 fL (ref 80.0–100.0)
PLATELETS: 354 10*3/uL (ref 150–440)
RBC: 3.39 MIL/uL — ABNORMAL LOW (ref 3.80–5.20)
RDW: 14.1 % (ref 11.5–14.5)
WBC: 13.6 10*3/uL — ABNORMAL HIGH (ref 3.6–11.0)

## 2017-06-02 LAB — GLUCOSE, CAPILLARY
GLUCOSE-CAPILLARY: 348 mg/dL — AB (ref 65–99)
GLUCOSE-CAPILLARY: 164 mg/dL — AB (ref 65–99)
Glucose-Capillary: 400 mg/dL — ABNORMAL HIGH (ref 65–99)
Glucose-Capillary: 301 mg/dL — ABNORMAL HIGH (ref 65–99)
Glucose-Capillary: 150 mg/dL — ABNORMAL HIGH (ref 65–99)

## 2017-06-02 LAB — MAGNESIUM: MAGNESIUM: 2.7 mg/dL — AB (ref 1.7–2.4)

## 2017-06-02 MED ORDER — SODIUM CHLORIDE 0.9 % IV SOLN
INTRAVENOUS | Status: DC
  Administered 2017-06-02 – 2017-06-03 (×2): via INTRAVENOUS

## 2017-06-02 MED ORDER — MORPHINE SULFATE (PF) 2 MG/ML IV SOLN
2.0000 mg | Freq: Four times a day (QID) | INTRAVENOUS | Status: DC | PRN
  Administered 2017-06-02 – 2017-06-05 (×5): 2 mg via INTRAVENOUS
  Filled 2017-06-02 (×5): qty 1

## 2017-06-02 NOTE — Consult Note (Signed)
Charlann LangeGolden, Krimson [id number] 11/04/1940 Ramonita LabGouru, Aruna, MD  Reason for Consult: Neck abscess  HPI: 77 year old female with a history of questionable neck cellulitis and abscess was made in the hospital had a culture done of the left neck pus this culture has not grown anything. She was initially on vancomycin and Zosyn and vancomycin has been stop she's been on Zosyn. Although the swelling has decreased she still having in swelling on the left-hand side and intermittent drainage.  Allergies:  Allergies  Allergen Reactions  . Sulfa Antibiotics Nausea And Vomiting    ROS: Review of systems normal other than 12 systems except per HPI.  PMH:  Past Medical History:  Diagnosis Date  . Diabetes mellitus without complication (HCC)   . Hypertension     FH:  Family History  Problem Relation Age of Onset  . Diabetes Sister     SH:  Social History   Socioeconomic History  . Marital status: Married    Spouse name: Not on file  . Number of children: Not on file  . Years of education: Not on file  . Highest education level: Not on file  Social Needs  . Financial resource strain: Not hard at all  . Food insecurity - worry: Never true  . Food insecurity - inability: Never true  . Transportation needs - medical: No  . Transportation needs - non-medical: No  Occupational History  . Not on file  Tobacco Use  . Smoking status: Never Smoker  . Smokeless tobacco: Never Used  Substance and Sexual Activity  . Alcohol use: No    Frequency: Never  . Drug use: No  . Sexual activity: Not Currently  Other Topics Concern  . Not on file  Social History Narrative   Lives with husband at home    PSH:  Past Surgical History:  Procedure Laterality Date  . ABDOMINAL HYSTERECTOMY      Physical  Exam: CN 2-12 grossly intact and symmetric. EAC/TMs normal BL. Oral cavity, lips, gums, ororpharynx normal with no masses or lesions. Skin warm and dry. Nasal cavity without polyps or purulence.  External nose and ears without masses or lesions. EOMI, PERRLA. Neck exam obvious draining abscess left neck. Thyroid normal with no masses.    Review of CT scan shows likely collection of submandibular pus.  Procedure: After patient's consent the left neck was prepped sterilely. A curved hemostat was introduced into the abscess tract and opened widely. Approximately 15-20 cc of milky green pus was expressed from the wound. This was sent for culture. We'll was then cleaned with Betadine and iodoform gauze was packed within the wound. A 4 x 4 dressing was then placed.  A/P: Neck abscess-I&D performed at bedside, culture taken, iodoform gauze placed. Would recommend adding vancomycin back until we get this culture result back. I will see her tomorrow to advance the iodoform gauze outward slightly.   Davina PokeChapman T Mickell Birdwell 06/02/2017 2:07 PM

## 2017-06-02 NOTE — Progress Notes (Signed)
Patient ID: Tiffany Manning, female   DOB: 06-06-1940, 77 y.o.   MRN: [id number]   [facility] PROGRESS NOTE  Tiffany Manning WUJ:[id number] DOB: 1941/01/11 DOA: 05/28/2017 PCP: Jaclyn Shaggy, MD  HPI/Subjective: Patient is c/o pain in the abscess in the neck.  Clinically not feeling better.  Decreased p.o. intake, husband at bedside concerned about his wife  Objective: Vitals:   06/02/17 0344 06/02/17 1410  BP: (!) 143/64 137/64  Pulse: (!) 128 (!) 101  Resp: 20 18  Temp: 98.3 F (36.8 C) 98.7 F (37.1 C)  SpO2: 98% 99%    Filed Weights   05/28/17 1158  Weight: 58.1 kg (128 lb)    ROS: Review of Systems  Constitutional: Negative for chills and fever.  Eyes: Negative for blurred vision.  Respiratory: Negative for cough and shortness of breath.   Cardiovascular: Negative for chest pain.  Gastrointestinal: Negative for abdominal pain, constipation, diarrhea, nausea and vomiting.  Genitourinary: Negative for dysuria.  Musculoskeletal: Negative for joint pain.  Neurological: Negative for dizziness and headaches.   Exam: Physical Exam  HENT:  Nose: No mucosal edema.  Mouth/Throat: No oropharyngeal exudate or posterior oropharyngeal edema.  Eyes: Conjunctivae, EOM and lids are normal. Pupils are equal, round, and reactive to light.  Neck: No JVD present. Carotid bruit is not present. No edema present. No thyroid mass and no thyromegaly present.  Large abscess left neck.  Cardiovascular: S1 normal and S2 normal. Exam reveals no gallop.  No murmur heard. Pulses:      Dorsalis pedis pulses are 2+ on the right side, and 2+ on the left side.  Respiratory: No respiratory distress. She has no wheezes. She has no rhonchi. She has no rales.  GI: Soft. Bowel sounds are normal. There is no tenderness.  Musculoskeletal:       Right shoulder: She exhibits no swelling.  Lymphadenopathy:    She has no cervical adenopathy.  Neurological: She is alert. No cranial nerve deficit.  Skin:  Skin is warm. No rash noted. Nails show no clubbing.  Large nonhealing wound lateral left leg.  Psychiatric: She has a normal mood and affect.      Data Reviewed: Basic Metabolic Panel: Recent Labs  Lab 05/28/17 1200 05/28/17 1738 05/29/17 0446 05/29/17 1551 05/30/17 0447 06/01/17 0650 06/02/17 0401  NA 135  --  137  --  138 138 139  K 3.4*  --  2.6* 3.9 3.7 3.2* 3.6  CL 104  --  111  --  111 105 104  CO2 10*  --  12*  --  17* 19* 21*  GLUCOSE 225*  --  357*  --  295* 309* 340*  BUN 64*  --  52*  --  37* 30* 25*  CREATININE 2.09* 1.87* 1.46*  --  1.23* 1.51* 1.48*  CALCIUM 9.2  --  8.2*  --  8.8* 8.3* 8.2*  MG  --   --   --   --  2.0 1.5* 2.7*   Liver Function Tests: Recent Labs  Lab 05/28/17 1200  AST 19  ALT 10*  ALKPHOS 81  BILITOT 0.6  PROT 8.3*  ALBUMIN 3.3*   CBC: Recent Labs  Lab 05/28/17 1200 05/29/17 0446 05/30/17 0447 06/01/17 0650 06/02/17 0401  WBC 16.8* 10.2 11.9* 13.1* 13.6*  NEUTROABS 14.2*  --   --   --   --   HGB 10.5* 9.1* 9.1* 9.8* 10.6*  HCT 32.5* 27.7* 28.2* 29.7* 32.3*  MCV 96.4 96.0 94.9  94.1 95.3  PLT 452* 376 403 362 354    CBG: Recent Labs  Lab 06/01/17 2057 06/02/17 0746 06/02/17 0942 06/02/17 1136 06/02/17 1700  GLUCAP 241* 348* 400* 301* 164*    Recent Results (from the past 240 hour(s))  Blood Culture (routine x 2)     Status: None   Collection Time: 05/28/17  2:03 PM  Result Value Ref Range Status   Specimen Description BLOOD LAC  Final   Special Requests   Final    BOTTLES DRAWN AEROBIC AND ANAEROBIC Blood Culture results may not be optimal due to an excessive volume of blood received in culture bottles   Culture   Final    NO GROWTH 5 DAYS Performed at [facility], [address] [place], [place] [address]    Report Status 06/02/2017 FINAL  Final  Blood Culture (routine x 2)     Status: None   Collection Time: 05/28/17  2:08 PM  Result Value Ref Range Status   Specimen Description BLOOD LFOA   Final   Special Requests   Final    BOTTLES DRAWN AEROBIC AND ANAEROBIC Blood Culture results may not be optimal due to an excessive volume of blood received in culture bottles   Culture   Final    NO GROWTH 5 DAYS Performed at [facility], [address] [place], [place] [address]    Report Status 06/02/2017 FINAL  Final  Aerobic/Anaerobic Culture (surgical/deep wound)     Status: None (Preliminary result)   Collection Time: 05/29/17  8:00 AM  Result Value Ref Range Status   Specimen Description   Final    ABSCESS Performed at [facility], [address] [place], [place] [address]    Special Requests   Final    Normal Performed at [facility], [address] [place], [place] [address]    Gram Stain   Final    RARE WBC PRESENT, PREDOMINANTLY PMN NO ORGANISMS SEEN    Culture   Final    NO GROWTH 4 DAYS NO ANAEROBES ISOLATED; CULTURE IN PROGRESS FOR 5 DAYS Performed at [facility], [address] [place], [place] [address]    Report Status PENDING  Incomplete  C difficile quick scan w PCR reflex     Status: None   Collection Time: 05/30/17 11:50 AM  Result Value Ref Range Status   C Diff antigen NEGATIVE NEGATIVE Final   C Diff toxin NEGATIVE NEGATIVE Final   C Diff interpretation No C. difficile detected.  Final    Comment: Performed at [facility], [address] [place], [place] [address]      Scheduled Meds: . feeding supplement (GLUCERNA SHAKE)  237 mL Oral TID WC  . heparin  5,000 Units Subcutaneous Q8H  . insulin aspart  0-5 Units Subcutaneous QHS  . insulin aspart  0-9 Units Subcutaneous TID WC  . insulin aspart  3 Units Subcutaneous TID WC  . insulin glargine  10 Units Subcutaneous Daily  . mupirocin ointment   Topical Daily  . potassium chloride  40 mEq Oral BID  . simvastatin  40 mg Oral Daily   Continuous Infusions: . piperacillin-tazobactam (ZOSYN)  IV 3.375 g (06/02/17 1729)     Assessment/Plan:  # Abscess left neck.  On vancomycin and Zosyn.  Initial culture negative.  Appreciate ENT consultation, Dr. Jenne CampusMcQueen did incision and drainage at bedside and repeat wound cultures were sent.  Gram stain is also ordered .wound  was packed with gauze and covered with dressing.  Recommending to continue IV antibiotics r.  Blood cultures are negative.  #Acute kidney injury improved with IV fluids.  Baseline creatinine 1.23. Creatinine 1.51-1.48 continue IV fluids and avoid nephrotoxins  #Hypokalemia.  Maxide is not a good medication for this patient.  Potassium replaced.  Potassium at 3.6 today  #Type 2 diabetes mellitus.  Hemoglobin A1c is actually 7.0.  Sugars are up secondary to steroids.  Compensating with Lantus and short-acting insulin.    #Leukocytosis secondary to recent steroid use  #Nonhealing wound of the left leg.  Appreciate wound care consultation.  Will likely need wound care center as outpatient.  #Hyperlipidemia unspecified on simvastatin  # Essential hypertension.  Blood pressure seems okay and we are holding medications at this point  #Anemia -= hemoglobin 10.6 continue close monitoring  #Urinalysis looks positive.  Antibiotics would cover.  # Generalized weakness PT consult is pending  Code Status:     Code Status Orders  (From admission, onward)        Start     Ordered   05/28/17 1638  Full code  Continuous     05/28/17 1637    Code Status History    Date Active Date Inactive Code Status Order ID Comments User Context   This patient has a current code status but no historical code status.     Family Communication: Husband at the bedside Disposition Plan: Continue IV antibiotics for now  Consultants:  ENT  Antibiotics:  Vancomycin  Zosyn  Time spent:  Angelino Rumery  [employer]

## 2017-06-02 NOTE — Consult Note (Signed)
MEDICATION RELATED CONSULT NOTE   Pharmacy Consult for electrolyes Indication: hypokalemia  Allergies  Allergen Reactions  . Sulfa Antibiotics Nausea And Vomiting    Patient Measurements: Height: 5' (152.4 cm) Weight: 128 lb (58.1 kg) IBW/kg (Calculated) : 45.5 Adjusted Body Weight:   Vital Signs: Temp: 98.3 F (36.8 C) (01/26 0344) Temp Source: Oral (01/26 0344) BP: 143/64 (01/26 0344) Pulse Rate: 128 (01/26 0344) Intake/Output from previous day: 01/25 0701 - 01/26 0700 In: 240 [P.O.:240] Out: -  Intake/Output from this shift: No intake/output data recorded.  Labs: Recent Labs    06/01/17 0650 06/02/17 0401  WBC 13.1* 13.6*  HGB 9.8* 10.6*  HCT 29.7* 32.3*  PLT 362 354  CREATININE 1.51* 1.48*  MG 1.5* 2.7*   Estimated Creatinine Clearance: 25.4 mL/min (A) (by C-G formula based on SCr of 1.48 mg/dL (H)).  Assessment: Patient is a 77 year old female admitted with sialoadenitis. Pt was hypokalemic on admission. Has been on KCL 40 MEQ po BID for several days. K had been WNL but dropped again this AM to 3.2. Pt w/ nausea, 2 BM yesterday but no diarrhea. Prime Dr has ordered an additional 40 MEQ x 2 doses on top of 40 BID.   Goal of Therapy:  Normalization of electrolytes  Plan:  I ordered an add on Mg level. Replace if low. Continue with orders from prime Dr. And follow up in the AM. Pt renal function is relatively unstable. Therefore will replace somewhat cautiously.  Add on Mg was 1.5. Will give Mg 4g IV once. Recheck in the AM  1/26: K 3.6, Mag 2.7 today after supplementation yesterday No supplementation needed at this time. Check labs in AM.   Crist FatHannah Ricky Gallery, PharmD, BCPS Clinical Pharmacist 06/02/2017 9:51 AM

## 2017-06-02 NOTE — Progress Notes (Signed)
PT Cancellation Note  Patient Details Name: Tiffany LangeBarbara Manning MRN: [id number] DOB: 03/06/1941   Cancelled Treatment:    Reason Eval/Treat Not Completed: Pain limiting ability to participate(Evaluation attempted for third time.  Patient continues to decline secondary to pain.  Noted bedside I and D to L neck abscess done this date.  Patient does report OOB to chair for a period of time this AM; declines additional mobility at this time.  Will re-attempt one additional time next date.)  Laurina Fischl H. Manson PasseyBrown, PT, DPT, NCS 06/02/17, 4:18 PM [phone]

## 2017-06-03 LAB — AEROBIC/ANAEROBIC CULTURE W GRAM STAIN (SURGICAL/DEEP WOUND): Special Requests: NORMAL

## 2017-06-03 LAB — GLUCOSE, CAPILLARY
GLUCOSE-CAPILLARY: 114 mg/dL — AB (ref 65–99)
Glucose-Capillary: 216 mg/dL — ABNORMAL HIGH (ref 65–99)
Glucose-Capillary: 158 mg/dL — ABNORMAL HIGH (ref 65–99)
Glucose-Capillary: 106 mg/dL — ABNORMAL HIGH (ref 65–99)

## 2017-06-03 LAB — MAGNESIUM: MAGNESIUM: 2.1 mg/dL (ref 1.7–2.4)

## 2017-06-03 LAB — CBC
HCT: 29.4 % — ABNORMAL LOW (ref 35.0–47.0)
Hemoglobin: 9.5 g/dL — ABNORMAL LOW (ref 12.0–16.0)
MCH: 31 pg (ref 26.0–34.0)
MCHC: 32.2 g/dL (ref 32.0–36.0)
MCV: 96 fL (ref 80.0–100.0)
PLATELETS: 272 10*3/uL (ref 150–440)
RBC: 3.06 MIL/uL — AB (ref 3.80–5.20)
RDW: 14 % (ref 11.5–14.5)
WBC: 13.5 10*3/uL — ABNORMAL HIGH (ref 3.6–11.0)

## 2017-06-03 LAB — BASIC METABOLIC PANEL
Anion gap: 10 (ref 5–15)
BUN: 21 mg/dL — ABNORMAL HIGH (ref 6–20)
CHLORIDE: 108 mmol/L (ref 101–111)
CO2: 22 mmol/L (ref 22–32)
CREATININE: 1.57 mg/dL — AB (ref 0.44–1.00)
Calcium: 7.6 mg/dL — ABNORMAL LOW (ref 8.9–10.3)
GFR calc non Af Amer: 31 mL/min — ABNORMAL LOW (ref >60)
GFR, EST AFRICAN AMERICAN: 36 mL/min — AB (ref >60)
Glucose, Bld: 208 mg/dL — ABNORMAL HIGH (ref 65–99)
POTASSIUM: 3.6 mmol/L (ref 3.5–5.1)
SODIUM: 140 mmol/L (ref 135–145)

## 2017-06-03 LAB — VANCOMYCIN, RANDOM
VANCOMYCIN RM: 24
Vancomycin Rm: 27

## 2017-06-03 LAB — AEROBIC/ANAEROBIC CULTURE (SURGICAL/DEEP WOUND): CULTURE: NO GROWTH

## 2017-06-03 NOTE — Progress Notes (Addendum)
Pharmacy Antibiotic Note  Tiffany LangeBarbara Manning is a 77 y.o. female admitted on 05/28/2017 with sialoadenitis.  Pharmacy has been consulted for vancomycin and Zosyn dosing. Pt also with AKI.  This is day #5 of IV antibiotics.   Plan: Continue piperacillin/tazobactam 3.375 g IV q8h EI  Vancomycin level = 20 mcg/mL obtained last night was upper end of target range of 15-20 mcg/mL, but prior to steady state on vancomycin 1000 mg IV q36h. Additionally, SCr has increased slightly - anticipate needing a q48h regimen.  Will discontinue scheduled vancomycin regimen and check a vancomycin level 48 hours post dose. Redose if vancomycin level < 15-20 mcg/mL.  Height: 5' (152.4 cm) Weight: 128 lb (58.1 kg) IBW/kg (Calculated) : 45.5  Temp (24hrs), Avg:98.3 F (36.8 C), Min:98 F (36.7 C), Max:98.7 F (37.1 C)  Recent Labs  Lab 05/28/17 1200 05/28/17 1342 05/28/17 1738 05/29/17 0446 05/29/17 1346 05/30/17 0447 06/01/17 0012 06/01/17 0650 06/02/17 0401 06/02/17 2323  WBC 16.8*  --   --  10.2  --  11.9*  --  13.1* 13.6*  --   CREATININE 2.09*  --  1.87* 1.46*  --  1.23*  --  1.51* 1.48*  --   LATICACIDVEN  --  1.2  --   --   --   --   --   --   --   --   VANCOTROUGH  --   --   --   --   --   --  20  --   --   --   VANCORANDOM  --   --   --   --  10  --   --   --   --  27    Estimated Creatinine Clearance: 25.4 mL/min (A) (by C-G formula based on SCr of 1.48 mg/dL (H)).    Allergies  Allergen Reactions  . Sulfa Antibiotics Nausea And Vomiting    Antimicrobials this admission: Vancomycin + piperacillin/tazobactam 1/21 >>  Dose adjustments this admission: 1/22  Random Vanc= 10 mcg/ml (24 hrs after 1st dose): change Vanc 1 gram q48 to 1 gram q36h 1/25 Vancomycin 1000 mg IV q36 - will d/c regimen and check random level  1/26 PM vanc level 27. Will recheck random with AM labs. 1/27 AM vanc level 24. Recheck with tomorrow AM labs. No dose ordered.  Microbiology results: 1/21 BCx: No  growth x 4 days  Thank you for allowing pharmacy to be a part of this patient's care.  Erich MontaneMcBane,Eldo Umanzor S, PharmD, BCPS Clinical Pharmacist 06/03/2017 12:18 AM

## 2017-06-03 NOTE — Consult Note (Addendum)
MEDICATION RELATED CONSULT NOTE   Pharmacy Consult for electrolyes Indication: hypokalemia  Allergies  Allergen Reactions  . Sulfa Antibiotics Nausea And Vomiting    Patient Measurements: Height: 5' (152.4 cm) Weight: 128 lb (58.1 kg) IBW/kg (Calculated) : 45.5 Adjusted Body Weight:   Vital Signs: Temp: 97.5 F (36.4 C) (01/27 0434) Temp Source: Oral (01/27 0434) BP: 126/74 (01/27 0434) Pulse Rate: 131 (01/27 0434) Intake/Output from previous day: 01/26 0701 - 01/27 0700 In: 1231.3 [P.O.:120; I.V.:811.3; IV Piggyback:300] Out: 650 [Urine:650] Intake/Output from this shift: Total I/O In: 120 [P.O.:120] Out: -   Labs: Recent Labs    06/01/17 0650 06/02/17 0401 06/03/17 0407  WBC 13.1* 13.6* 13.5*  HGB 9.8* 10.6* 9.5*  HCT 29.7* 32.3* 29.4*  PLT 362 354 272  CREATININE 1.51* 1.48* 1.57*  MG 1.5* 2.7* 2.1   Estimated Creatinine Clearance: 23.9 mL/min (A) (by C-G formula based on SCr of 1.57 mg/dL (H)).  Assessment: Patient is a 77 year old female admitted with sialoadenitis. Pt was hypokalemic on admission. Has been on KCL 40 MEQ po BID for several days. K had been WNL but dropped again this AM to 3.2. Pt w/ nausea, 2 BM yesterday but no diarrhea. Prime Dr has ordered an additional 40 MEQ x 2 doses on top of 40 BID.   Goal of Therapy:  Normalization of electrolytes  Plan:  I ordered an add on Mg level. Replace if low. Continue with orders from prime Dr. And follow up in the AM. Pt renal function is relatively unstable. Therefore will replace somewhat cautiously.  Add on Mg was 1.5. Will give Mg 4g IV once. Recheck in the AM  1/26: K 3.6, Mag 2.7 today after supplementation yesterday No supplementation needed at this time. Check labs in AM.   1/27: K 3.6, Mag 2.1 No add'l supplementation needed at this time. MD already ordered BMET for AM.    Crist FatHannah Nicholette Dolson, PharmD, BCPS Clinical Pharmacist 06/03/2017 11:45 AM

## 2017-06-03 NOTE — Progress Notes (Signed)
Pharmacy Antibiotic Note  Tiffany Manning is a 77 y.o. female admitted on 05/28/2017 with sialoadenitis.  Pharmacy has been consulted for vancomycin and Zosyn dosing. Pt also with AKI.   Plan: Continue piperacillin/tazobactam 3.375 g IV q8h EI  Vancomycin level = 20 mcg/mL obtained last night was upper end of target range of 15-20 mcg/mL, but prior to steady state on vancomycin 1000 mg IV q36h. Additionally, SCr has increased slightly - anticipate needing a q48h regimen.  Will discontinue scheduled vancomycin regimen and check a vancomycin level 48 hours post dose. Redose if vancomycin level < 15-20 mcg/mL.  1/27: Pt got almost daily vanc from 1/21-1/23; last dose 1/25 at 0051. Likely waiting for level from accumulated doses to trend down. Two level kinetics suggest Ke 0.025 and would expect level to be ~15 at 2200. Will order level for 2200 tonight to see if we can redose.   Height: 5' (152.4 cm) Weight: 128 lb (58.1 kg) IBW/kg (Calculated) : 45.5  Temp (24hrs), Avg:98.1 F (36.7 C), Min:97.5 F (36.4 C), Max:98.7 F (37.1 C)  Recent Labs  Lab 05/28/17 1342  05/29/17 0446  05/30/17 0447 06/01/17 0012 06/01/17 0650 06/02/17 0401 06/02/17 2323 06/03/17 0407  WBC  --   --  10.2  --  11.9*  --  13.1* 13.6*  --  13.5*  CREATININE  --    < > 1.46*  --  1.23*  --  1.51* 1.48*  --  1.57*  LATICACIDVEN 1.2  --   --   --   --   --   --   --   --   --   VANCOTROUGH  --   --   --   --   --  20  --   --   --   --   VANCORANDOM  --   --   --    < >  --   --   --   --  27 24   < > = values in this interval not displayed.    Estimated Creatinine Clearance: 23.9 mL/min (A) (by C-G formula based on SCr of 1.57 mg/dL (H)).    Allergies  Allergen Reactions  . Sulfa Antibiotics Nausea And Vomiting    Antimicrobials this admission: Vancomycin + piperacillin/tazobactam 1/21 >>  Dose adjustments this admission: 1/22  Random Vanc= 10 mcg/ml (24 hrs after 1st dose): change Vanc 1 gram q48 to  1 gram q36h 1/25 Vancomycin 1000 mg IV q36 - will d/c regimen and check random level  1/26 PM vanc level 27. Will recheck random with AM labs. 1/27 AM vanc level 24. Recheck with tomorrow AM labs. No dose ordered.  Microbiology results: 1/21 BCx: No growth x 4 days  Thank you for allowing pharmacy to be a part of this patient's care.  Marty HeckWang, Kadeidra Coryell L, PharmD, BCPS Clinical Pharmacist 06/03/2017 12:03 PM

## 2017-06-03 NOTE — Progress Notes (Signed)
Patient ID: Tiffany Manning, female   DOB: 1940/09/28, 77 y.o.   MRN: [id number]   [employer] PROGRESS NOTE  Tiffany Manning WUJ:[id number] DOB: 03/06/1941 DOA: 05/28/2017 PCP: Jaclyn Shaggy, MD  HPI/Subjective: Patient is feeling better.  Hungry today, husband at bedside concerned about his wife Dressing change by ENT  Objective: Vitals:   06/02/17 2016 06/03/17 0434  BP: 129/77 126/74  Pulse: (!) 133 (!) 131  Resp: 20 16  Temp: 98 F (36.7 C) (!) 97.5 F (36.4 C)  SpO2: 99% 98%    Filed Weights   05/28/17 1158  Weight: 58.1 kg (128 lb)    ROS: Review of Systems  Constitutional: Negative for chills and fever.  Eyes: Negative for blurred vision.  Respiratory: Negative for cough and shortness of breath.   Cardiovascular: Negative for chest pain.  Gastrointestinal: Negative for abdominal pain, constipation, diarrhea, nausea and vomiting.  Genitourinary: Negative for dysuria.  Musculoskeletal: Negative for joint pain.  Neurological: Negative for dizziness and headaches.   Exam: Physical Exam  HENT:  Nose: No mucosal edema.  Mouth/Throat: No oropharyngeal exudate or posterior oropharyngeal edema.  Eyes: Conjunctivae, EOM and lids are normal. Pupils are equal, round, and reactive to light.  Neck: No JVD present. Carotid bruit is not present. No edema present. No thyroid mass and no thyromegaly present.  Large abscess left neck.  Cardiovascular: S1 normal and S2 normal. Exam reveals no gallop.  No murmur heard. Pulses:      Dorsalis pedis pulses are 2+ on the right side, and 2+ on the left side.  Respiratory: No respiratory distress. She has no wheezes. She has no rhonchi. She has no rales.  GI: Soft. Bowel sounds are normal. There is no tenderness.  Musculoskeletal:       Right shoulder: She exhibits no swelling.  Lymphadenopathy:    She has no cervical adenopathy.  Neurological: She is alert. No cranial nerve deficit.  Skin: Skin is warm. No rash noted. Nails show  no clubbing.  Large nonhealing wound lateral left leg.  Psychiatric: She has a normal mood and affect.      Data Reviewed: Basic Metabolic Panel: Recent Labs  Lab 05/29/17 0446 05/29/17 1551 05/30/17 0447 06/01/17 0650 06/02/17 0401 06/03/17 0407  NA 137  --  138 138 139 140  K 2.6* 3.9 3.7 3.2* 3.6 3.6  CL 111  --  111 105 104 108  CO2 12*  --  17* 19* 21* 22  GLUCOSE 357*  --  295* 309* 340* 208*  BUN 52*  --  37* 30* 25* 21*  CREATININE 1.46*  --  1.23* 1.51* 1.48* 1.57*  CALCIUM 8.2*  --  8.8* 8.3* 8.2* 7.6*  MG  --   --  2.0 1.5* 2.7* 2.1   Liver Function Tests: Recent Labs  Lab 05/28/17 1200  AST 19  ALT 10*  ALKPHOS 81  BILITOT 0.6  PROT 8.3*  ALBUMIN 3.3*   CBC: Recent Labs  Lab 05/28/17 1200 05/29/17 0446 05/30/17 0447 06/01/17 0650 06/02/17 0401 06/03/17 0407  WBC 16.8* 10.2 11.9* 13.1* 13.6* 13.5*  NEUTROABS 14.2*  --   --   --   --   --   HGB 10.5* 9.1* 9.1* 9.8* 10.6* 9.5*  HCT 32.5* 27.7* 28.2* 29.7* 32.3* 29.4*  MCV 96.4 96.0 94.9 94.1 95.3 96.0  PLT 452* 376 403 362 354 272    CBG: Recent Labs  Lab 06/02/17 1136 06/02/17 1700 06/02/17 2105 06/03/17 0807 06/03/17 1215  GLUCAP 301* 164* 150* 216* 158*    Recent Results (from the past 240 hour(s))  Blood Culture (routine x 2)     Status: None   Collection Time: 05/28/17  2:03 PM  Result Value Ref Range Status   Specimen Description BLOOD LAC  Final   Special Requests   Final    BOTTLES DRAWN AEROBIC AND ANAEROBIC Blood Culture results may not be optimal due to an excessive volume of blood received in culture bottles   Culture   Final    NO GROWTH 5 DAYS Performed at [facility], [address] [place], [place] [address]    Report Status 06/02/2017 FINAL  Final  Blood Culture (routine x 2)     Status: None   Collection Time: 05/28/17  2:08 PM  Result Value Ref Range Status   Specimen Description BLOOD LFOA  Final   Special Requests   Final    BOTTLES DRAWN  AEROBIC AND ANAEROBIC Blood Culture results may not be optimal due to an excessive volume of blood received in culture bottles   Culture   Final    NO GROWTH 5 DAYS Performed at [facility], [address] [place], [place] [address]    Report Status 06/02/2017 FINAL  Final  Aerobic/Anaerobic Culture (surgical/deep wound)     Status: None   Collection Time: 05/29/17  8:00 AM  Result Value Ref Range Status   Specimen Description   Final    ABSCESS Performed at [facility], [address] [place], [place] [address]    Special Requests   Final    Normal Performed at [facility], [address] [place], [place] [address]    Gram Stain   Final    RARE WBC PRESENT, PREDOMINANTLY PMN NO ORGANISMS SEEN    Culture   Final    No growth aerobically or anaerobically. Performed at [facility], [address], [place], [place] [address]    Report Status 06/03/2017 FINAL  Final  C difficile quick scan w PCR reflex     Status: None   Collection Time: 05/30/17 11:50 AM  Result Value Ref Range Status   C Diff antigen NEGATIVE NEGATIVE Final   C Diff toxin NEGATIVE NEGATIVE Final   C Diff interpretation No C. difficile detected.  Final    Comment: Performed at [facility], [address] [place], [place] [address]  Aerobic/Anaerobic Culture (surgical/deep wound)     Status: None (Preliminary result)   Collection Time: 06/02/17  1:40 PM  Result Value Ref Range Status   Specimen Description   Final    NECK Performed at [facility], [address] [place], [place] [address]    Special Requests   Final    NONE Performed at [facility], [address] [place], [place] [address]    Gram Stain   Final    RARE WBC PRESENT, PREDOMINANTLY PMN RARE GRAM POSITIVE COCCI IN PAIRS    Culture   Final    NO GROWTH 1 DAY Performed at [facility], [address] [place], [place] [address]    Report Status PENDING   Incomplete      Scheduled Meds: . feeding supplement (GLUCERNA SHAKE)  237 mL Oral TID WC  . heparin  5,000 Units Subcutaneous Q8H  . insulin aspart  0-5 Units Subcutaneous QHS  . insulin aspart  0-9 Units Subcutaneous TID WC  . insulin aspart  3  Units Subcutaneous TID WC  . insulin glargine  10 Units Subcutaneous Daily  . mupirocin ointment   Topical Daily  . potassium chloride  40 mEq Oral BID  . simvastatin  40 mg Oral Daily   Continuous Infusions: . sodium chloride 100 mL/hr at 06/03/17 1200  . piperacillin-tazobactam (ZOSYN)  IV Stopped (06/03/17 1243)    Assessment/Plan:  # Abscess left neck.  On vancomycin and Zosyn.  Initial culture negative.  Appreciate ENT consultation, Dr. Jenne CampusMcQueen did incision and drainage at bedside and repeat wound cultures from January 26 revealing gram-positive cocci in pairs Gram stain is also ordered .wound was packed with gauze and covered with dressing.  Recommending to continue IV antibiotics r.  Blood cultures are negative.  #Acute kidney injury improved with IV fluids.  Baseline creatinine 1.23. Creatinine 1.51-1.48 --1.47 continue IV fluids and avoid nephrotoxins  #Hypokalemia.  Maxide is not a good medication for this patient.  Potassium replaced.  Potassium at 3.6 today  #Type 2 diabetes mellitus.  Hemoglobin A1c is actually 7.0.  Sugars are up secondary to steroids.  Compensating with Lantus and short-acting insulin.    #Leukocytosis secondary to recent steroid use  #Nonhealing wound of the left leg.  Appreciate wound care consultation.  Will likely need wound care center as outpatient.  #Hyperlipidemia unspecified on simvastatin  # Essential hypertension.  Blood pressure seems okay and we are holding medications at this point  #Anemia -= hemoglobin 10.6 --9.5 continue close monitoring  #Urinalysis looks positive.  Antibiotics would cover.  # Generalized weakness PT consult is pending  Code Status:     Code Status Orders   (From admission, onward)        Start     Ordered   05/28/17 1638  Full code  Continuous     05/28/17 1637    Code Status History    Date Active Date Inactive Code Status Order ID Comments User Context   This patient has a current code status but no historical code status.     Family Communication: Husband at the bedside Disposition Plan: Continue IV antibiotics for now  Consultants:  ENT  Antibiotics:  Vancomycin  Zosyn  Time spent: 32minutes  Reva Pinkley  [employer]

## 2017-06-03 NOTE — Progress Notes (Signed)
06/03/2017 12:09 PM  Tiffany Manning [id number]  Post-Op Day 1    Temp:  [97.5 F (36.4 C)-98.7 F (37.1 C)] 97.5 F (36.4 C) (01/27 0434) Pulse Rate:  [101-133] 131 (01/27 0434) Resp:  [16-20] 16 (01/27 0434) BP: (126-137)/(64-77) 126/74 (01/27 0434) SpO2:  [98 %-99 %] 98 % (01/27 0434),     Intake/Output Summary (Last 24 hours) at 06/03/2017 1209 Last data filed at 06/03/2017 0800 Gross per 24 hour  Intake 1231.25 ml  Output 650 ml  Net 581.25 ml    Results for orders placed or performed during the hospital encounter of 05/28/17 (from the past 24 hour(s))  Aerobic/Anaerobic Culture (surgical/deep wound)     Status: None (Preliminary result)   Collection Time: 06/02/17  1:40 PM  Result Value Ref Range   Specimen Description      NECK Performed at [facility], [address] [place], [place] [address]    Special Requests      NONE Performed at [facility], [address], [place], [place] [address]    Gram Stain      RARE WBC PRESENT, PREDOMINANTLY PMN RARE GRAM POSITIVE COCCI IN PAIRS Performed at [facility], [address] [place], [place] [address]    Culture PENDING    Report Status PENDING   Glucose, capillary     Status: Abnormal   Collection Time: 06/02/17  5:00 PM  Result Value Ref Range   Glucose-Capillary 164 (H) 65 - 99 mg/dL  Glucose, capillary     Status: Abnormal   Collection Time: 06/02/17  9:05 PM  Result Value Ref Range   Glucose-Capillary 150 (H) 65 - 99 mg/dL  Vancomycin, random     Status: None   Collection Time: 06/02/17 11:23 PM  Result Value Ref Range   Vancomycin Rm 27   Basic metabolic panel     Status: Abnormal   Collection Time: 06/03/17  4:07 AM  Result Value Ref Range   Sodium 140 135 - 145 mmol/L   Potassium 3.6 3.5 - 5.1 mmol/L   Chloride 108 101 - 111 mmol/L   CO2 22 22 - 32 mmol/L   Glucose, Bld 208 (H) 65 - 99 mg/dL   BUN 21 (H) 6 - 20 mg/dL   Creatinine, Ser 4.69 (H) 0.44 - 1.00 mg/dL   Calcium 7.6 (L) 8.9 - 10.3 mg/dL   GFR calc non Af Amer 31 (L) >60 mL/min   GFR calc Af Amer 36 (L) >60 mL/min   Anion gap 10 5 - 15  Magnesium     Status: None   Collection Time: 06/03/17  4:07 AM  Result Value Ref Range   Magnesium 2.1 1.7 - 2.4 mg/dL  CBC     Status: Abnormal   Collection Time: 06/03/17  4:07 AM  Result Value Ref Range   WBC 13.5 (H) 3.6 - 11.0 K/uL   RBC 3.06 (L) 3.80 - 5.20 MIL/uL   Hemoglobin 9.5 (L) 12.0 - 16.0 g/dL   HCT 62.9 (L) 52.8 - 41.3 %   MCV 96.0 80.0 - 100.0 fL   MCH 31.0 26.0 - 34.0 pg   MCHC 32.2 32.0 - 36.0 g/dL   RDW 24.4 01.0 - 27.2 %   Platelets 272 150 - 440 K/uL  Vancomycin, random     Status: None   Collection Time: 06/03/17  4:07 AM  Result Value Ref Range   Vancomycin Rm 24   Glucose, capillary     Status:  Abnormal   Collection Time: 06/03/17  8:07 AM  Result Value Ref Range   Glucose-Capillary 216 (H) 65 - 99 mg/dL    SUBJECTIVE:  Feeling better, hungry  OBJECTIVE:  Neck-swelling decreased, iodoform gauze advanced outward approx 2cm.    IMPRESSION:  S/p I & D neck abscess, culture Gm + cocci in pairs.  Awaiting final ID and sensitivities  PLAN:  Continue current antibiotics until final cultures are posted.  I will be by in the AM to advance the gauze out more.  Recommend KVO IV fluids, and start 2200 cal ada diet.    Tiffany Manning 06/03/2017, 12:09 PM

## 2017-06-03 NOTE — Progress Notes (Signed)
PT Cancellation Note  Patient Details Name: Tiffany Manning MRN: [id number] DOB: 04/09/1941   Cancelled Treatment:    Reason Eval/Treat Not Completed: Pain limiting ability to participate(Patient continues to refuse participation with PT evaluation due to pain, generally not feeling well.  Have made 4 attempts with patient at this point (unsuccessfully).  Will complete order at this time.  Please re-consult as medical status improves, patient able to tolerate activity.)   Ry Moody H. Manson PasseyBrown, PT, DPT, NCS 06/03/17, 11:09 AM [phone]

## 2017-06-04 LAB — CBC WITH DIFFERENTIAL/PLATELET
BASOS ABS: 0 10*3/uL (ref 0–0.1)
BASOS PCT: 0 %
EOS PCT: 3 %
Eosinophils Absolute: 0.4 10*3/uL (ref 0–0.7)
HCT: 29 % — ABNORMAL LOW (ref 35.0–47.0)
Hemoglobin: 9.2 g/dL — ABNORMAL LOW (ref 12.0–16.0)
Lymphocytes Relative: 25 %
Lymphs Abs: 2.8 10*3/uL (ref 1.0–3.6)
MCH: 31 pg (ref 26.0–34.0)
MCHC: 31.8 g/dL — ABNORMAL LOW (ref 32.0–36.0)
MCV: 97.3 fL (ref 80.0–100.0)
MONO ABS: 0.6 10*3/uL (ref 0.2–0.9)
Monocytes Relative: 5 %
NEUTROS ABS: 7.5 10*3/uL — AB (ref 1.4–6.5)
Neutrophils Relative %: 67 %
PLATELETS: 261 10*3/uL (ref 150–440)
RBC: 2.98 MIL/uL — AB (ref 3.80–5.20)
RDW: 14.7 % — AB (ref 11.5–14.5)
WBC: 11.3 10*3/uL — AB (ref 3.6–11.0)

## 2017-06-04 LAB — BASIC METABOLIC PANEL
ANION GAP: 9 (ref 5–15)
BUN: 16 mg/dL (ref 6–20)
CO2: 23 mmol/L (ref 22–32)
Calcium: 7.4 mg/dL — ABNORMAL LOW (ref 8.9–10.3)
Chloride: 107 mmol/L (ref 101–111)
Creatinine, Ser: 1.51 mg/dL — ABNORMAL HIGH (ref 0.44–1.00)
GFR, EST AFRICAN AMERICAN: 37 mL/min — AB (ref >60)
GFR, EST NON AFRICAN AMERICAN: 32 mL/min — AB (ref >60)
Glucose, Bld: 128 mg/dL — ABNORMAL HIGH (ref 65–99)
Potassium: 3.6 mmol/L (ref 3.5–5.1)
SODIUM: 139 mmol/L (ref 135–145)

## 2017-06-04 LAB — GLUCOSE, CAPILLARY
GLUCOSE-CAPILLARY: 112 mg/dL — AB (ref 65–99)
GLUCOSE-CAPILLARY: 149 mg/dL — AB (ref 65–99)
GLUCOSE-CAPILLARY: 154 mg/dL — AB (ref 65–99)
GLUCOSE-CAPILLARY: 156 mg/dL — AB (ref 65–99)
GLUCOSE-CAPILLARY: 70 mg/dL (ref 65–99)

## 2017-06-04 LAB — VANCOMYCIN, RANDOM: VANCOMYCIN RM: 18

## 2017-06-04 MED ORDER — VANCOMYCIN HCL IN DEXTROSE 1-5 GM/200ML-% IV SOLN
1000.0000 mg | INTRAVENOUS | Status: DC
  Administered 2017-06-04: 1000 mg via INTRAVENOUS
  Filled 2017-06-04: qty 200

## 2017-06-04 MED ORDER — SODIUM CHLORIDE 0.9 % IV SOLN
INTRAVENOUS | Status: AC
  Administered 2017-06-04 – 2017-06-05 (×2): via INTRAVENOUS

## 2017-06-04 NOTE — Progress Notes (Signed)
Pharmacy Antibiotic Note  Tiffany LangeBarbara Tuminello is a 77 y.o. female admitted on 05/28/2017 with sialoadenitis.  Pharmacy has been consulted for vancomycin and Zosyn dosing. Pt also with AKI.   Plan: Continue piperacillin/tazobactam 3.375 g IV q8h EI  Vancomycin level = 20 mcg/mL obtained last night was upper end of target range of 15-20 mcg/mL, but prior to steady state on vancomycin 1000 mg IV q36h. Additionally, SCr has increased slightly - anticipate needing a q48h regimen.  Will discontinue scheduled vancomycin regimen and check a vancomycin level 48 hours post dose. Redose if vancomycin level < 15-20 mcg/mL.  1/27: Pt got almost daily vanc from 1/21-1/23; last dose 1/25 at 0051. Likely waiting for level from accumulated doses to trend down. Two level kinetics suggest Ke 0.025 and would expect level to be ~15 at 2200. Will order level for 2200 tonight to see if we can redose.   1/27 2200 vanc level 18. Restarting at 1 gram q 48 hours. Check level before 2nd new dose to ensure clearance.  Height: 5' (152.4 cm) Weight: 128 lb (58.1 kg) IBW/kg (Calculated) : 45.5  Temp (24hrs), Avg:97.8 F (36.6 C), Min:97.5 F (36.4 C), Max:98.2 F (36.8 C)  Recent Labs  Lab 05/28/17 1342  05/29/17 0446  05/30/17 0447 06/01/17 0012 06/01/17 0650 06/02/17 0401  06/03/17 0407 06/03/17 2215  WBC  --   --  10.2  --  11.9*  --  13.1* 13.6*  --  13.5*  --   CREATININE  --    < > 1.46*  --  1.23*  --  1.51* 1.48*  --  1.57*  --   LATICACIDVEN 1.2  --   --   --   --   --   --   --   --   --   --   VANCOTROUGH  --   --   --   --   --  20  --   --   --   --   --   VANCORANDOM  --   --   --    < >  --   --   --   --    < > 24 18   < > = values in this interval not displayed.    Estimated Creatinine Clearance: 23.9 mL/min (A) (by C-G formula based on SCr of 1.57 mg/dL (H)).    Allergies  Allergen Reactions  . Sulfa Antibiotics Nausea And Vomiting    Antimicrobials this admission: Vancomycin +  piperacillin/tazobactam 1/21 >>  Dose adjustments this admission: 1/22  Random Vanc= 10 mcg/ml (24 hrs after 1st dose): change Vanc 1 gram q48 to 1 gram q36h 1/25 Vancomycin 1000 mg IV q36 - will d/c regimen and check random level  1/26 PM vanc level 27. Will recheck random with AM labs. 1/27 AM vanc level 24. Recheck with tomorrow AM labs. No dose ordered.  Microbiology results: 1/21 BCx: No growth x 4 days  Thank you for allowing pharmacy to be a part of this patient's care.  Erich MontaneMcBane,Lakia Gritton S, PharmD, BCPS Clinical Pharmacist 06/04/2017 12:31 AM

## 2017-06-04 NOTE — Progress Notes (Signed)
Patient ID: Tiffany Manning, female   DOB: 27-Jan-1941, 77 y.o.   MRN: [id number]   [employer] PROGRESS NOTE  Alayssa Flinchum WUJ:[id number] DOB: 02-09-1941 DOA: 05/28/2017 PCP: Jaclyn Shaggy, MD  HPI/Subjective: Patient is feeling better but very weak and tired.  Refused PT assessment x4.  According to the husband patient was refusing to go out for the past few months, does not want to be bothered, just want to sleep Objective: Vitals:   06/04/17 1341 06/04/17 1443  BP: (!) 146/84 105/60  Pulse: (!) 108   Resp:    Temp: 98.6 F (37 C)   SpO2: 98%     Filed Weights   05/28/17 1158  Weight: 58.1 kg (128 lb)    ROS: Review of Systems  Constitutional: Negative for chills and fever.  Eyes: Negative for blurred vision.  Respiratory: Negative for cough and shortness of breath.   Cardiovascular: Negative for chest pain.  Gastrointestinal: Negative for abdominal pain, constipation, diarrhea, nausea and vomiting.  Genitourinary: Negative for dysuria.  Musculoskeletal: Negative for joint pain.  Neurological: Negative for dizziness and headaches.   Exam: Physical Exam  HENT:  Nose: No mucosal edema.  Mouth/Throat: No oropharyngeal exudate or posterior oropharyngeal edema.  Eyes: Conjunctivae, EOM and lids are normal. Pupils are equal, round, and reactive to light.  Neck: No JVD present. Carotid bruit is not present. No edema present. No thyroid mass and no thyromegaly present.  Large abscess left neck.  Cardiovascular: S1 normal and S2 normal. Exam reveals no gallop.  No murmur heard. Pulses:      Dorsalis pedis pulses are 2+ on the right side, and 2+ on the left side.  Respiratory: No respiratory distress. She has no wheezes. She has no rhonchi. She has no rales.  GI: Soft. Bowel sounds are normal. There is no tenderness.  Musculoskeletal:       Right shoulder: She exhibits no swelling.  Lymphadenopathy:    She has no cervical adenopathy.  Neurological: She is alert. No  cranial nerve deficit.  Skin: Skin is warm. No rash noted. Nails show no clubbing.  Large nonhealing wound lateral left leg.  Psychiatric: She has a normal mood and affect.      Data Reviewed: Basic Metabolic Panel: Recent Labs  Lab 05/30/17 0447 06/01/17 0650 06/02/17 0401 06/03/17 0407 06/04/17 0330  NA 138 138 139 140 139  K 3.7 3.2* 3.6 3.6 3.6  CL 111 105 104 108 107  CO2 17* 19* 21* 22 23  GLUCOSE 295* 309* 340* 208* 128*  BUN 37* 30* 25* 21* 16  CREATININE 1.23* 1.51* 1.48* 1.57* 1.51*  CALCIUM 8.8* 8.3* 8.2* 7.6* 7.4*  MG 2.0 1.5* 2.7* 2.1  --    Liver Function Tests: No results for input(s): AST, ALT, ALKPHOS, BILITOT, PROT, ALBUMIN in the last 168 hours. CBC: Recent Labs  Lab 05/30/17 0447 06/01/17 0650 06/02/17 0401 06/03/17 0407 06/04/17 0330  WBC 11.9* 13.1* 13.6* 13.5* 11.3*  NEUTROABS  --   --   --   --  7.5*  HGB 9.1* 9.8* 10.6* 9.5* 9.2*  HCT 28.2* 29.7* 32.3* 29.4* 29.0*  MCV 94.9 94.1 95.3 96.0 97.3  PLT 403 362 354 272 261    CBG: Recent Labs  Lab 06/03/17 1654 06/03/17 2108 06/04/17 0740 06/04/17 1136 06/04/17 1659  GLUCAP 114* 106* 156* 149* 154*    Recent Results (from the past 240 hour(s))  Blood Culture (routine x 2)     Status: None  Collection Time: 05/28/17  2:03 PM  Result Value Ref Range Status   Specimen Description BLOOD LAC  Final   Special Requests   Final    BOTTLES DRAWN AEROBIC AND ANAEROBIC Blood Culture results may not be optimal due to an excessive volume of blood received in culture bottles   Culture   Final    NO GROWTH 5 DAYS Performed at [facility], [address] [place], [place] [address]    Report Status 06/02/2017 FINAL  Final  Blood Culture (routine x 2)     Status: None   Collection Time: 05/28/17  2:08 PM  Result Value Ref Range Status   Specimen Description BLOOD LFOA  Final   Special Requests   Final    BOTTLES DRAWN AEROBIC AND ANAEROBIC Blood Culture results may not be optimal  due to an excessive volume of blood received in culture bottles   Culture   Final    NO GROWTH 5 DAYS Performed at [facility], [address] [place], [place] [address]    Report Status 06/02/2017 FINAL  Final  Aerobic/Anaerobic Culture (surgical/deep wound)     Status: None   Collection Time: 05/29/17  8:00 AM  Result Value Ref Range Status   Specimen Description   Final    ABSCESS Performed at [facility], [address] [place], [place] [address]    Special Requests   Final    Normal Performed at [facility], [address] [place], [place] [address]    Gram Stain   Final    RARE WBC PRESENT, PREDOMINANTLY PMN NO ORGANISMS SEEN    Culture   Final    No growth aerobically or anaerobically. Performed at [facility], [address] [place], [place] [address]    Report Status 06/03/2017 FINAL  Final  C difficile quick scan w PCR reflex     Status: None   Collection Time: 05/30/17 11:50 AM  Result Value Ref Range Status   C Diff antigen NEGATIVE NEGATIVE Final   C Diff toxin NEGATIVE NEGATIVE Final   C Diff interpretation No C. difficile detected.  Final    Comment: Performed at [facility], [address] [place], [place] [address]  Aerobic/Anaerobic Culture (surgical/deep wound)     Status: None (Preliminary result)   Collection Time: 06/02/17  1:40 PM  Result Value Ref Range Status   Specimen Description   Final    NECK Performed at [facility], [address] [place], [place] [address]    Special Requests   Final    NONE Performed at [facility], [address] [place], [place] [address]    Gram Stain   Final    RARE WBC PRESENT, PREDOMINANTLY PMN RARE GRAM POSITIVE COCCI IN PAIRS    Culture   Final    CULTURE REINCUBATED FOR BETTER GROWTH Performed at [facility], [address] [place], [place] [address]    Report Status PENDING  Incomplete      Scheduled Meds: .  feeding supplement (GLUCERNA SHAKE)  237 mL Oral TID WC  . heparin  5,000 Units Subcutaneous Q8H  . insulin aspart  0-5 Units Subcutaneous QHS  . insulin aspart  0-9 Units Subcutaneous TID WC  . insulin aspart  3 Units Subcutaneous TID WC  . insulin glargine  10 Units Subcutaneous Daily  . mupirocin ointment   Topical Daily  . potassium chloride  40 mEq Oral  BID  . simvastatin  40 mg Oral Daily   Continuous Infusions: . piperacillin-tazobactam (ZOSYN)  IV 3.375 g (06/04/17 2013)  . vancomycin Stopped (06/04/17 1610)    Assessment/Plan:  # Abscess left neck.  On vancomycin and Zosyn.  Initial culture negative.  Appreciate ENT consultation, Dr. Jenne Campus did incision and drainage at bedside and repeat wound cultures from January 26 revealing gram-positive cocci in pairs , reintubated for better growth  wound was packed with gauze and covered with dressing.  The drain will be removed tomorrow by ENT  recommending to continue IV antibiotics r.  Blood cultures are negative.  #Acute kidney injury improved with IV fluids.  Baseline creatinine 1.23. Creatinine 1.51-1.48 --1.47-1.51 continue IV fluids and avoid nephrotoxins  #Possible depression versus anhedonia: We will consult psychiatry  #Hypokalemia.  Maxide is not a good medication for this patient.  Potassium replaced.  Potassium at 3.6 today  #Type 2 diabetes mellitus.  Hemoglobin A1c is actually 7.0.  Sugars are up secondary to steroids.  Compensating with Lantus and short-acting insulin.    #Leukocytosis secondary to recent steroid use  #Nonhealing wound of the left leg.  Appreciate wound care consultation.  Will likely need wound care center as outpatient.  #Hyperlipidemia unspecified on simvastatin  # Essential hypertension.  Blood pressure seems okay and we are holding medications at this point  #Anemia -= hemoglobin 10.6 --9.5 continue close monitoring  #Urinalysis looks positive.  Antibiotics would cover.  # Generalized  weakness PT consult is pending  Code Status:     Code Status Orders  (From admission, onward)        Start     Ordered   05/28/17 1638  Full code  Continuous     05/28/17 1637    Code Status History    Date Active Date Inactive Code Status Order ID Comments User Context   This patient has a current code status but no historical code status.     Family Communication: Husband at the bedside Disposition Plan: Continue IV antibiotics for now  Consultants:  ENT  Antibiotics:  Vancomycin  Zosyn  Time spent:  Toribio Seiber  [employer]

## 2017-06-04 NOTE — Progress Notes (Signed)
PT Cancellation Note  Patient Details Name: Tiffany LangeBarbara Manning MRN: [id number] DOB: 07/11/1940   Cancelled Treatment:    Reason Eval/Treat Not Completed: Other (comment)(Pt reported she is fatigued and feels unwell). PT d/c orders for pt yesterday (06/03/17) as pt had refused PTx4. New order placed on 06/03/17, therefore, PT re-attempted eval for 5th today, and pt refused 2/2 feeling unwell.  Zerita BoersJennifer Claryssa Sandner, PT,DPT 06/04/17 9:16 AM      [place] Zou L 06/04/2017, 9:16 AM

## 2017-06-04 NOTE — Consult Note (Signed)
MEDICATION RELATED CONSULT NOTE   Pharmacy Consult for electrolyes Indication: hypokalemia  Allergies  Allergen Reactions  . Sulfa Antibiotics Nausea And Vomiting    Patient Measurements: Height: 5' (152.4 cm) Weight: 128 lb (58.1 kg) IBW/kg (Calculated) : 45.5 Adjusted Body Weight:   Vital Signs: Temp: 97.4 F (36.3 C) (01/28 0356) BP: 115/61 (01/28 0356) Pulse Rate: 106 (01/28 0356) Intake/Output from previous day: 01/27 0701 - 01/28 0700 In: 1338.8 [P.O.:360; I.V.:928.8; IV Piggyback:50] Out: -  Intake/Output from this shift: No intake/output data recorded.  Labs: Recent Labs    06/02/17 0401 06/03/17 0407 06/04/17 0330  WBC 13.6* 13.5* 11.3*  HGB 10.6* 9.5* 9.2*  HCT 32.3* 29.4* 29.0*  PLT 354 272 261  CREATININE 1.48* 1.57* 1.51*  MG 2.7* 2.1  --    Estimated Creatinine Clearance: 24.9 mL/min (A) (by C-G formula based on SCr of 1.51 mg/dL (H)).  Assessment: Patient is a 77 year old female admitted with sialoadenitis. Pt was hypokalemic on admission.   Goal of Therapy:  Normalization of electrolytes  Plan:  1/28: K 3.6 No add'l supplementation needed at this time. MD already ordered BMET for AM. Will continue to f/u with am labs and replace as needed.     Gardner CandleSheema M Carlei Huang, PharmD, BCPS Clinical Pharmacist 06/04/2017 9:28 AM

## 2017-06-04 NOTE — Progress Notes (Signed)
06/04/2017 8:00 AM  Charlann Lange [id number]  Post-Op Day 2    Temp:  [97.4 F (36.3 C)-98.2 F (36.8 C)] 97.4 F (36.3 C) (01/28 0356) Pulse Rate:  [106-120] 106 (01/28 0356) Resp:  [15-22] 15 (01/28 0356) BP: (115-143)/(61-81) 115/61 (01/28 0356) SpO2:  [98 %-99 %] 99 % (01/28 0356),     Intake/Output Summary (Last 24 hours) at 06/04/2017 0800 Last data filed at 06/03/2017 1800 Gross per 24 hour  Intake 1218.75 ml  Output -  Net 1218.75 ml    Results for orders placed or performed during the hospital encounter of 05/28/17 (from the past 24 hour(s))  Glucose, capillary     Status: Abnormal   Collection Time: 06/03/17  8:07 AM  Result Value Ref Range   Glucose-Capillary 216 (H) 65 - 99 mg/dL  Glucose, capillary     Status: Abnormal   Collection Time: 06/03/17 12:15 PM  Result Value Ref Range   Glucose-Capillary 158 (H) 65 - 99 mg/dL  Glucose, capillary     Status: Abnormal   Collection Time: 06/03/17  4:54 PM  Result Value Ref Range   Glucose-Capillary 114 (H) 65 - 99 mg/dL  Glucose, capillary     Status: Abnormal   Collection Time: 06/03/17  9:08 PM  Result Value Ref Range   Glucose-Capillary 106 (H) 65 - 99 mg/dL  Vancomycin, random     Status: None   Collection Time: 06/03/17 10:15 PM  Result Value Ref Range   Vancomycin Rm 18   CBC with Differential/Platelet     Status: Abnormal   Collection Time: 06/04/17  3:30 AM  Result Value Ref Range   WBC 11.3 (H) 3.6 - 11.0 K/uL   RBC 2.98 (L) 3.80 - 5.20 MIL/uL   Hemoglobin 9.2 (L) 12.0 - 16.0 g/dL   HCT 40.9 (L) 81.1 - 91.4 %   MCV 97.3 80.0 - 100.0 fL   MCH 31.0 26.0 - 34.0 pg   MCHC 31.8 (L) 32.0 - 36.0 g/dL   RDW 78.2 (H) 95.6 - 21.3 %   Platelets 261 150 - 440 K/uL   Neutrophils Relative % 67 %   Neutro Abs 7.5 (H) 1.4 - 6.5 K/uL   Lymphocytes Relative 25 %   Lymphs Abs 2.8 1.0 - 3.6 K/uL   Monocytes Relative 5 %   Monocytes Absolute 0.6 0.2 - 0.9 K/uL   Eosinophils Relative 3 %   Eosinophils Absolute  0.4 0 - 0.7 K/uL   Basophils Relative 0 %   Basophils Absolute 0.0 0 - 0.1 K/uL  Basic metabolic panel     Status: Abnormal   Collection Time: 06/04/17  3:30 AM  Result Value Ref Range   Sodium 139 135 - 145 mmol/L   Potassium 3.6 3.5 - 5.1 mmol/L   Chloride 107 101 - 111 mmol/L   CO2 23 22 - 32 mmol/L   Glucose, Bld 128 (H) 65 - 99 mg/dL   BUN 16 6 - 20 mg/dL   Creatinine, Ser 0.86 (H) 0.44 - 1.00 mg/dL   Calcium 7.4 (L) 8.9 - 10.3 mg/dL   GFR calc non Af Amer 32 (L) >60 mL/min   GFR calc Af Amer 37 (L) >60 mL/min   Anion gap 9 5 - 15  Glucose, capillary     Status: Abnormal   Collection Time: 06/04/17  7:40 AM  Result Value Ref Range   Glucose-Capillary 156 (H) 65 - 99 mg/dL    SUBJECTIVE:  Feeling better, appetite returned, up  and moving around  OBJECTIVE:  Neck-iodoform gauzed advanced outward and trimmed.  No abscess reaccumulation noted  IMPRESSION:  S/p I & D of neck abscess-doing much better, waiting on culture results for sensitivities.  Once we have those we can likely switch to po meds.  PLAN:  I will remove the last remnant of the drain tomorrow assuming the neck continues to improve.  Hopefully the culture results will be available today to target infection.  Continue current antibiotics until then.    Davina PokeChapman T Halsey Hammen 06/04/2017, 8:00 AM

## 2017-06-05 LAB — BASIC METABOLIC PANEL
ANION GAP: 8 (ref 5–15)
BUN: 13 mg/dL (ref 6–20)
CHLORIDE: 108 mmol/L (ref 101–111)
CO2: 21 mmol/L — AB (ref 22–32)
CREATININE: 1.44 mg/dL — AB (ref 0.44–1.00)
Calcium: 7.1 mg/dL — ABNORMAL LOW (ref 8.9–10.3)
GFR calc non Af Amer: 34 mL/min — ABNORMAL LOW (ref >60)
GFR, EST AFRICAN AMERICAN: 39 mL/min — AB (ref >60)
Glucose, Bld: 100 mg/dL — ABNORMAL HIGH (ref 65–99)
POTASSIUM: 3.3 mmol/L — AB (ref 3.5–5.1)
Sodium: 137 mmol/L (ref 135–145)

## 2017-06-05 LAB — CBC WITH DIFFERENTIAL/PLATELET
BASOS ABS: 0.1 10*3/uL (ref 0–0.1)
BASOS PCT: 1 %
EOS ABS: 0.3 10*3/uL (ref 0–0.7)
Eosinophils Relative: 3 %
HEMATOCRIT: 27.7 % — AB (ref 35.0–47.0)
HEMOGLOBIN: 8.8 g/dL — AB (ref 12.0–16.0)
Lymphocytes Relative: 23 %
Lymphs Abs: 2 10*3/uL (ref 1.0–3.6)
MCH: 30.9 pg (ref 26.0–34.0)
MCHC: 31.7 g/dL — AB (ref 32.0–36.0)
MCV: 97.6 fL (ref 80.0–100.0)
MONOS PCT: 6 %
Monocytes Absolute: 0.5 10*3/uL (ref 0.2–0.9)
NEUTROS ABS: 5.7 10*3/uL (ref 1.4–6.5)
NEUTROS PCT: 67 %
Platelets: 217 10*3/uL (ref 150–440)
RBC: 2.83 MIL/uL — AB (ref 3.80–5.20)
RDW: 14.4 % (ref 11.5–14.5)
WBC: 8.5 10*3/uL (ref 3.6–11.0)

## 2017-06-05 LAB — GLUCOSE, CAPILLARY
GLUCOSE-CAPILLARY: 115 mg/dL — AB (ref 65–99)
Glucose-Capillary: 88 mg/dL (ref 65–99)
Glucose-Capillary: 97 mg/dL (ref 65–99)
Glucose-Capillary: 77 mg/dL (ref 65–99)

## 2017-06-05 LAB — MRSA PCR SCREENING: MRSA BY PCR: NEGATIVE

## 2017-06-05 MED ORDER — POTASSIUM CHLORIDE 10 MEQ/100ML IV SOLN
10.0000 meq | INTRAVENOUS | Status: AC
  Administered 2017-06-05 (×3): 10 meq via INTRAVENOUS
  Filled 2017-06-05 (×3): qty 100

## 2017-06-05 MED ORDER — SODIUM CHLORIDE 0.9 % IV SOLN
3.0000 g | Freq: Two times a day (BID) | INTRAVENOUS | Status: DC
  Administered 2017-06-05 – 2017-06-06 (×3): 3 g via INTRAVENOUS
  Filled 2017-06-05 (×6): qty 3

## 2017-06-05 MED ORDER — ADULT MULTIVITAMIN W/MINERALS CH: 1.0000 | ORAL_TABLET | Freq: Every day | ORAL | Status: DC

## 2017-06-05 MED ORDER — VANCOMYCIN HCL IN DEXTROSE 1-5 GM/200ML-% IV SOLN
1000.0000 mg | INTRAVENOUS | Status: DC
  Administered 2017-06-06: 01:00:00 1000 mg via INTRAVENOUS
  Filled 2017-06-05: qty 200

## 2017-06-05 MED ORDER — HYDROGEN PEROXIDE 3 % EX SOLN
Freq: Two times a day (BID) | CUTANEOUS | Status: DC
  Administered 2017-06-05 – 2017-06-06 (×3): via TOPICAL
  Filled 2017-06-05: qty 473

## 2017-06-05 NOTE — Progress Notes (Addendum)
Patient ID: Tiffany LangeBarbara Manning, female   DOB: 04/06/1941, 77 y.o.   MRN: [id number]   [employer] [facility] PROGRESS NOTE  Tiffany LangeBarbara Manning WUJ:[id number] DOB: 11/13/1940 DOA: 05/28/2017 PCP: Jaclyn Shaggyate, Denny C, MD  HPI/Subjective: Patient is feeling better reports she is getting stronger day by day.  Refused PT assessment x4.  Refused psychiatry consult Objective: Vitals:   06/05/17 1609 06/05/17 1953  BP: 110/64 (!) 116/55  Pulse: 84 81  Resp: 18 14  Temp: 98.4 F (36.9 C) 97.8 F (36.6 C)  SpO2: 99% 100%    Filed Weights   05/28/17 1158  Weight: 58.1 kg (128 lb)    ROS: Review of Systems  Constitutional: Negative for chills and fever.  Eyes: Negative for blurred vision.  Respiratory: Negative for cough and shortness of breath.   Cardiovascular: Negative for chest pain.  Gastrointestinal: Negative for abdominal pain, constipation, diarrhea, nausea and vomiting.  Genitourinary: Negative for dysuria.  Musculoskeletal: Negative for joint pain.  Neurological: Negative for dizziness and headaches.   Exam: Physical Exam  HENT:  Nose: No mucosal edema.  Mouth/Throat: No oropharyngeal exudate or posterior oropharyngeal edema.  Eyes: Conjunctivae, EOM and lids are normal. Pupils are equal, round, and reactive to light.  Neck: No JVD present. Carotid bruit is not present. No edema present. No thyroid mass and no thyromegaly present.  Large abscess left neck.  Cardiovascular: S1 normal and S2 normal. Exam reveals no gallop.  No murmur heard. Pulses:      Dorsalis pedis pulses are 2+ on the right side, and 2+ on the left side.  Respiratory: No respiratory distress. She has no wheezes. She has no rhonchi. She has no rales.  GI: Soft. Bowel sounds are normal. There is no tenderness.  Musculoskeletal:       Right shoulder: She exhibits no swelling.  Lymphadenopathy:    She has no cervical adenopathy.  Neurological: She is alert. No cranial nerve deficit.  Skin: Skin is warm. No rash noted.  Nails show no clubbing.  Large nonhealing wound lateral left leg.  Psychiatric: She has a normal mood and affect.      Data Reviewed: Basic Metabolic Panel: Recent Labs  Lab 05/30/17 0447 06/01/17 0650 06/02/17 0401 06/03/17 0407 06/04/17 0330 06/05/17 0630  NA 138 138 139 140 139 137  K 3.7 3.2* 3.6 3.6 3.6 3.3*  CL 111 105 104 108 107 108  CO2 17* 19* 21* 22 23 21*  GLUCOSE 295* 309* 340* 208* 128* 100*  BUN 37* 30* 25* 21* 16 13  CREATININE 1.23* 1.51* 1.48* 1.57* 1.51* 1.44*  CALCIUM 8.8* 8.3* 8.2* 7.6* 7.4* 7.1*  MG 2.0 1.5* 2.7* 2.1  --   --    Liver Function Tests: No results for input(s): AST, ALT, ALKPHOS, BILITOT, PROT, ALBUMIN in the last 168 hours. CBC: Recent Labs  Lab 06/01/17 0650 06/02/17 0401 06/03/17 0407 06/04/17 0330 06/05/17 0630  WBC 13.1* 13.6* 13.5* 11.3* 8.5  NEUTROABS  --   --   --  7.5* 5.7  HGB 9.8* 10.6* 9.5* 9.2* 8.8*  HCT 29.7* 32.3* 29.4* 29.0* 27.7*  MCV 94.1 95.3 96.0 97.3 97.6  PLT 362 354 272 261 217    CBG: Recent Labs  Lab 06/04/17 2235 06/04/17 2355 06/05/17 0743 06/05/17 1150 06/05/17 1646  GLUCAP 70 112* 88 97 77    Recent Results (from the past 240 hour(s))  Blood Culture (routine x 2)     Status: None   Collection Time: 05/28/17  2:03 PM  Result Value Ref Range Status   Specimen Description BLOOD LAC  Final   Special Requests   Final    BOTTLES DRAWN AEROBIC AND ANAEROBIC Blood Culture results may not be optimal due to an excessive volume of blood received in culture bottles   Culture   Final    NO GROWTH 5 DAYS Performed at [facility], [address] [place], [place] [address]    Report Status 06/02/2017 FINAL  Final  Blood Culture (routine x 2)     Status: None   Collection Time: 05/28/17  2:08 PM  Result Value Ref Range Status   Specimen Description BLOOD LFOA  Final   Special Requests   Final    BOTTLES DRAWN AEROBIC AND ANAEROBIC Blood Culture results may not be optimal due to an  excessive volume of blood received in culture bottles   Culture   Final    NO GROWTH 5 DAYS Performed at [facility], [address], [place], [place] [address]    Report Status 06/02/2017 FINAL  Final  Aerobic/Anaerobic Culture (surgical/deep wound)     Status: None   Collection Time: 05/29/17  8:00 AM  Result Value Ref Range Status   Specimen Description   Final    ABSCESS Performed at [facility], [address] [place], [place] [address]    Special Requests   Final    Normal Performed at [facility], [address] [place], [place] [address]    Gram Stain   Final    RARE WBC PRESENT, PREDOMINANTLY PMN NO ORGANISMS SEEN    Culture   Final    No growth aerobically or anaerobically. Performed at [facility], [address], [place], [place] [address]    Report Status 06/03/2017 FINAL  Final  C difficile quick scan w PCR reflex     Status: None   Collection Time: 05/30/17 11:50 AM  Result Value Ref Range Status   C Diff antigen NEGATIVE NEGATIVE Final   C Diff toxin NEGATIVE NEGATIVE Final   C Diff interpretation No C. difficile detected.  Final    Comment: Performed at [facility], [address] [place], [place] [address]  Aerobic/Anaerobic Culture (surgical/deep wound)     Status: None (Preliminary result)   Collection Time: 06/02/17  1:40 PM  Result Value Ref Range Status   Specimen Description   Final    NECK Performed at [facility], [address], [place], [place] [address]    Special Requests   Final    NONE Performed at [facility], [address] [place], [place] [address]    Gram Stain   Final    RARE WBC PRESENT, PREDOMINANTLY PMN RARE GRAM POSITIVE COCCI IN PAIRS Performed at [facility], [address], [place], [place] [address]    Culture   Final    RARE VIRIDANS STREPTOCOCCUS NO ANAEROBES ISOLATED; CULTURE IN PROGRESS FOR 5 DAYS    Report Status PENDING   Incomplete  MRSA PCR Screening     Status: None   Collection Time: 06/04/17 11:06 PM  Result Value Ref Range Status   MRSA by PCR NEGATIVE NEGATIVE Final    Comment:        The GeneXpert MRSA Assay (FDA approved for NASAL specimens only), is one component of a comprehensive MRSA colonization surveillance program. It is not intended to diagnose MRSA infection nor to guide or monitor treatment for MRSA infections. Performed  at [facility], [address] [place], [place] [address]       Scheduled Meds: . heparin  5,000 Units Subcutaneous Q8H  . hydrogen peroxide   Topical BID  . insulin aspart  0-5 Units Subcutaneous QHS  . insulin aspart  0-9 Units Subcutaneous TID WC  . insulin aspart  3 Units Subcutaneous TID WC  . insulin glargine  10 Units Subcutaneous Daily  . [START ON 06/06/2017] multivitamin with minerals  1 tablet Oral Daily  . mupirocin ointment   Topical Daily  . potassium chloride  40 mEq Oral BID  . simvastatin  40 mg Oral Daily   Continuous Infusions: . sodium chloride 75 mL/hr at 06/05/17 1438  . ampicillin-sulbactam (UNASYN) IV Stopped (06/05/17 1309)  . [START ON 06/06/2017] vancomycin      Assessment/Plan:  # Abscess left neck.     Initial culture negative.  Appreciate ENT consultation, Dr. Jenne Campus did incision and drainage at bedside and repeat wound cultures from January 26 revealing gram-positive cocci in pairs , reintubated for better growth .  Initially patient was on Zosyn and vancomycin and antibiotics changed to Unasyn and will continue vancomycin wound was packed with gauze and covered with dressing.  The drain was removed by ENT  ID recommendations appreciated, recommending to continue IV antibiotics and await for culture results.  AFB was sent looking for mycobacterial process giving recurrent and prolonged nature   Blood cultures are negative.  #Acute kidney injury improved with IV fluids.  Baseline creatinine 1.23. Creatinine  1.51-1.48 --1.47-1.51--1.4 continue IV fluids and avoid nephrotoxins  #Possible depression versus anhedonia: We will consult psychiatry  #Hypokalemia.  Maxide is not a good medication for this patient.  Potassium replaced.  Potassium at 3.6 today  #Type 2 diabetes mellitus.  Hemoglobin A1c is actually 7.0.  Sugars are up secondary to steroids.  Compensating with Lantus and short-acting insulin.    #Nonhealing wound of the left leg.  Appreciate wound care consultation.  Will likely need wound care center as outpatient.  #Left upper lobe lung nodule noted on chest x-ray-patient needs outpatient CT chest.  PCP to follow-up  #Hyperlipidemia unspecified on simvastatin  # Essential hypertension.  Blood pressure seems okay and we are holding medications at this point  #Anemia -= hemoglobin 10.6 --9.5 continue close monitoring  #Urinalysis looks positive.  Antibiotics would cover.  # Generalized weakness PT consulted but declined by patient multiple times  Code Status:     Code Status Orders  (From admission, onward)        Start     Ordered   05/28/17 1638  Full code  Continuous     05/28/17 1637    Code Status History    Date Active Date Inactive Code Status Order ID Comments User Context   This patient has a current code status but no historical code status.     Family Communication: Husband at the bedside Disposition Plan: Continue IV antibiotics for now  Consultants:  ENT  Antibiotics:  Vancomycin  Zosyn  Time spent:  Jadarion Halbig  [employer]

## 2017-06-05 NOTE — Progress Notes (Addendum)
Pharmacy Antibiotic Note  Tiffany LangeBarbara Manning is a 77 y.o. female admitted on 05/28/2017 with abscess of left neck.  Pharmacy now consulted for Unasyn and Vanc dosing. Pt also with AKI.   Plan: Vanc day 9. Continue Vancomycin 1 gram q 48 hours. Check level before 2nd new dose to ensure clearance.  Start Unasyn 3g IV every 12 hours based on current CrCl <5230ml/min. Will monitor renal function and adjust dose as needed.  Height: 5' (152.4 cm) Weight: 128 lb (58.1 kg) IBW/kg (Calculated) : 45.5  Temp (24hrs), Avg:98.4 F (36.9 C), Min:98.1 F (36.7 C), Max:98.6 F (37 C)  Recent Labs  Lab 06/01/17 0012 06/01/17 0650 06/02/17 0401  06/03/17 0407 06/03/17 2215 06/04/17 0330 06/05/17 0630  WBC  --  13.1* 13.6*  --  13.5*  --  11.3* 8.5  CREATININE  --  1.51* 1.48*  --  1.57*  --  1.51* 1.44*  VANCOTROUGH 20  --   --   --   --   --   --   --   VANCORANDOM  --   --   --    < > 24 18  --   --    < > = values in this interval not displayed.    Estimated Creatinine Clearance: 26.1 mL/min (A) (by C-G formula based on SCr of 1.44 mg/dL (H)).    Allergies  Allergen Reactions  . Sulfa Antibiotics Nausea And Vomiting    Antimicrobials this admission: Vancomycin + piperacillin/tazobactam 1/21 >>1/29 Unasyn 1/29>>   Dose adjustments this admission: 1/22  Random Vanc= 10 mcg/ml (24 hrs after 1st dose): change Vanc 1 gram q48 to 1 gram q36h 1/25 Vancomycin 1000 mg IV q36 - will d/c regimen and check random level  1/26 PM vanc level 27. Will recheck random with AM labs. 1/27 AM vanc level 24. Recheck with tomorrow AM labs. No dose ordered.  Microbiology results: 1/21 BCx: No growth x 4 days  Thank you for allowing pharmacy to be a part of this patient's care.  Gardner CandleSheema M Ayshia Gramlich, PharmD, BCPS Clinical Pharmacist 06/05/2017 11:10 AM

## 2017-06-05 NOTE — Consult Note (Signed)
MEDICATION RELATED CONSULT NOTE   Pharmacy Consult for electrolyes Indication: hypokalemia  Allergies  Allergen Reactions  . Sulfa Antibiotics Nausea And Vomiting    Patient Measurements: Height: 5' (152.4 cm) Weight: 128 lb (58.1 kg) IBW/kg (Calculated) : 45.5 Adjusted Body Weight:   Vital Signs: Temp: 98.1 F (36.7 C) (01/29 0531) BP: 128/58 (01/29 0531) Pulse Rate: 86 (01/29 0531) Intake/Output from previous day: 01/28 0701 - 01/29 0700 In: 1003.8 [P.O.:240; I.V.:313.8; IV Piggyback:450] Out: -  Intake/Output from this shift: No intake/output data recorded.  Labs: Recent Labs    06/03/17 0407 06/04/17 0330 06/05/17 0630  WBC 13.5* 11.3* 8.5  HGB 9.5* 9.2* 8.8*  HCT 29.4* 29.0* 27.7*  PLT 272 261 217  CREATININE 1.57* 1.51* 1.44*  MG 2.1  --   --    Estimated Creatinine Clearance: 26.1 mL/min (A) (by C-G formula based on SCr of 1.44 mg/dL (H)).  Assessment: Patient is a 77 year old female admitted with sialoadenitis. Pt was hypokalemic on admission.   Goal of Therapy:  Normalization of electrolytes  Plan:  1/28: K 3.3. Patient has KCL 40mEq ordered BID but she has been refusing to take it since 1/26.  Will order KCL 10mEq IV x 3.  Will F/U with AM labs and continue to replace as needed.  Gardner CandleSheema M Velena Keegan, PharmD, BCPS Clinical Pharmacist 06/05/2017 7:37 AM

## 2017-06-05 NOTE — Consult Note (Signed)
Psychiatry: Consult received earlier today.  Went to see patient and the patient expressed that she did not want to have a psychiatric consult.  Spoke with hospitalist.  Consult order was canceled.  No further follow-up.

## 2017-06-05 NOTE — Consult Note (Signed)
Tiffany Manning     Reason for Consult: Neck abscess   Referring Physician: Nicholes Manning Date of Admission:  05/28/2017   Principal Problem:   Acute renal failure ([place]) Active Problems:   Acute renal failure (ARF) ([place])   Protein-calorie malnutrition, severe   HPI: Tiffany Manning is a 77 y.o. female admitted with L neck and facial swelling and NV.  She had CT done with Submand infection but no stone. She was started on IV vanco and zosyn.  Had I and D done at bedside 1/26 with 15-20 cc of milky green pus.   On admit wbc was 16, now down to 8.  Gram stain with GPC in pairs. Cx from 1/22 neg. Had been on clindamycin as an otpt but did not tolerate. She reports that the she has dealt with this swelling off and on for a few months. She has been off on abx but cannot recall which ones. She has not had any fevers or night sweats but does have weight loss of 15 #s due to poor appetite.   Past Medical History:  Diagnosis Date  . Diabetes mellitus without complication ([place])   . Hypertension    Past Surgical History:  Procedure Laterality Date  . ABDOMINAL HYSTERECTOMY     Social History   Tobacco Use  . Smoking status: Never Smoker  . Smokeless tobacco: Never Used  Substance Use Topics  . Alcohol use: No    Frequency: Never  . Drug use: No   Family History  Problem Relation Age of Onset  . Diabetes Sister     Allergies:  Allergies  Allergen Reactions  . Sulfa Antibiotics Nausea And Vomiting    Current antibiotics: Antibiotics Given (last 72 hours)    Date/Time Action Medication Dose Rate   06/02/17 1729 New Bag/Given   piperacillin-tazobactam (ZOSYN) IVPB 3.375 g 3.375 g 12.5 mL/hr   06/03/17 0218 New Bag/Given   piperacillin-tazobactam (ZOSYN) IVPB 3.375 g 3.375 g 12.5 mL/hr   06/03/17 0911 New Bag/Given   piperacillin-tazobactam (ZOSYN) IVPB 3.375 g 3.375 g 12.5 mL/hr   06/03/17 1742 New Bag/Given   piperacillin-tazobactam (ZOSYN) IVPB 3.375 g 3.375 g  12.5 mL/hr   06/04/17 0104 New Bag/Given   vancomycin (VANCOCIN) IVPB 1000 mg/200 mL premix 1,000 mg 200 mL/hr   06/04/17 0119 New Bag/Given   piperacillin-tazobactam (ZOSYN) IVPB 3.375 g 3.375 g 12.5 mL/hr   06/04/17 1114 New Bag/Given   piperacillin-tazobactam (ZOSYN) IVPB 3.375 g 3.375 g 12.5 mL/hr   06/04/17 2013 New Bag/Given   piperacillin-tazobactam (ZOSYN) IVPB 3.375 g 3.375 g 12.5 mL/hr   06/05/17 0133 New Bag/Given   piperacillin-tazobactam (ZOSYN) IVPB 3.375 g 3.375 g 12.5 mL/hr   06/05/17 1239 New Bag/Given   Ampicillin-Sulbactam (UNASYN) 3 g in sodium chloride 0.9 % 100 mL IVPB 3 g 200 mL/hr      MEDICATIONS: . feeding supplement (GLUCERNA SHAKE)  237 mL Oral TID WC  . heparin  5,000 Units Subcutaneous Q8H  . hydrogen peroxide   Topical BID  . insulin aspart  0-5 Units Subcutaneous QHS  . insulin aspart  0-9 Units Subcutaneous TID WC  . insulin aspart  3 Units Subcutaneous TID WC  . insulin glargine  10 Units Subcutaneous Daily  . mupirocin ointment   Topical Daily  . potassium chloride  40 mEq Oral BID  . simvastatin  40 mg Oral Daily    Review of Systems - 11 systems reviewed and negative per HPI   OBJECTIVE:  Temp:  [98.1 F (36.7 C)] 98.1 F (36.7 C) (01/29 0531) Pulse Rate:  [86] 86 (01/29 0531) Resp:  [18] 18 (01/29 0531) BP: (128)/(58) 128/58 (01/29 0531) SpO2:  [99 %] 99 % (01/29 0531) Physical Exam  Constitutional:  oriented to person, place, and time. appears well-developed and well-nourished. No distress.  HENT: [place]/AT, PERRLA,  L neck with golfball sized area of induration with open wound draining some yellow purulence Mouth/Throat: Oropharynx is clear and moist. No oropharyngeal exudate.  Cardiovascular: Normal rate, regular rhythm and normal heart sounds. Exam reveals no gallop and no friction rub.  No murmur heard.  Pulmonary/Chest: Effort normal and breath sounds normal. No respiratory distress.  has no wheezes.  Neck = supple, no nuchal  rigidity Abdominal: Soft. Bowel sounds are normal.  exhibits no distension. There is no tenderness.  Lymphadenopathy: no cervical adenopathy. No axillary adenopathy Neurological: alert and oriented to person, place, and time.  Skin: Skin is warm and dry. No rash noted. No erythema.  Psychiatric: a normal mood and affect.  behavior is normal.    LABS: Results for orders placed or performed during the hospital encounter of 05/28/17 (from the past 48 hour(s))  Glucose, capillary     Status: Abnormal   Collection Time: 06/03/17  4:54 PM  Result Value Ref Range   Glucose-Capillary 114 (H) 65 - 99 mg/dL  Glucose, capillary     Status: Abnormal   Collection Time: 06/03/17  9:08 PM  Result Value Ref Range   Glucose-Capillary 106 (H) 65 - 99 mg/dL  Vancomycin, random     Status: None   Collection Time: 06/03/17 10:15 PM  Result Value Ref Range   Vancomycin Rm 18     Comment:        Random Vancomycin therapeutic range is dependent on dosage and time of specimen collection. A peak range is 20.0-40.0 ug/mL A trough range is 5.0-15.0 ug/mL        Performed at [facility], [address] [place], [place] [address]   CBC with Differential/Platelet     Status: Abnormal   Collection Time: 06/04/17  3:30 AM  Result Value Ref Range   WBC 11.3 (H) 3.6 - 11.0 K/uL   RBC 2.98 (L) 3.80 - 5.20 MIL/uL   Hemoglobin 9.2 (L) 12.0 - 16.0 g/dL   HCT 29.0 (L) 35.0 - 47.0 %   MCV 97.3 80.0 - 100.0 fL   MCH 31.0 26.0 - 34.0 pg   MCHC 31.8 (L) 32.0 - 36.0 g/dL   RDW 14.7 (H) 11.5 - 14.5 %   Platelets 261 150 - 440 K/uL   Neutrophils Relative % 67 %   Neutro Abs 7.5 (H) 1.4 - 6.5 K/uL   Lymphocytes Relative 25 %   Lymphs Abs 2.8 1.0 - 3.6 K/uL   Monocytes Relative 5 %   Monocytes Absolute 0.6 0.2 - 0.9 K/uL   Eosinophils Relative 3 %   Eosinophils Absolute 0.4 0 - 0.7 K/uL   Basophils Relative 0 %   Basophils Absolute 0.0 0 - 0.1 K/uL    Comment: Performed at [facility], [address] [place], [place] [address]  Basic metabolic panel     Status: Abnormal   Collection Time: 06/04/17  3:30 AM  Result Value Ref Range   Sodium 139 135 - 145 mmol/L   Potassium 3.6 3.5 - 5.1 mmol/L   Chloride 107 101 - 111 mmol/L   CO2 23 22 - 32 mmol/L  Glucose, Bld 128 (H) 65 - 99 mg/dL   BUN 16 6 - 20 mg/dL   Creatinine, Ser 1.51 (H) 0.44 - 1.00 mg/dL   Calcium 7.4 (L) 8.9 - 10.3 mg/dL   GFR calc non Af Amer 32 (L) >60 mL/min   GFR calc Af Amer 37 (L) >60 mL/min    Comment: (NOTE) The eGFR has been calculated using the CKD EPI equation. This calculation has not been validated in all clinical situations. eGFR's persistently <60 mL/min signify possible Chronic Kidney Manning.    Anion gap 9 5 - 15    Comment: Performed at [facility], [address] [place], [place] [address]  Glucose, capillary     Status: Abnormal   Collection Time: 06/04/17  7:40 AM  Result Value Ref Range   Glucose-Capillary 156 (H) 65 - 99 mg/dL  Glucose, capillary     Status: Abnormal   Collection Time: 06/04/17 11:36 AM  Result Value Ref Range   Glucose-Capillary 149 (H) 65 - 99 mg/dL  Glucose, capillary     Status: Abnormal   Collection Time: 06/04/17  4:59 PM  Result Value Ref Range   Glucose-Capillary 154 (H) 65 - 99 mg/dL  Glucose, capillary     Status: None   Collection Time: 06/04/17 10:35 PM  Result Value Ref Range   Glucose-Capillary 70 65 - 99 mg/dL  MRSA PCR Screening     Status: None   Collection Time: 06/04/17 11:06 PM  Result Value Ref Range   MRSA by PCR NEGATIVE NEGATIVE    Comment:        The GeneXpert MRSA Assay (FDA approved for NASAL specimens only), is one component of a comprehensive MRSA colonization surveillance program. It is not intended to diagnose MRSA infection nor to guide or monitor treatment for MRSA infections. Performed at [facility], [address] [place], [place] [address]   Glucose, capillary     Status: Abnormal    Collection Time: 06/04/17 11:55 PM  Result Value Ref Range   Glucose-Capillary 112 (H) 65 - 99 mg/dL  CBC with Differential/Platelet     Status: Abnormal   Collection Time: 06/05/17  6:30 AM  Result Value Ref Range   WBC 8.5 3.6 - 11.0 K/uL   RBC 2.83 (L) 3.80 - 5.20 MIL/uL   Hemoglobin 8.8 (L) 12.0 - 16.0 g/dL   HCT 27.7 (L) 35.0 - 47.0 %   MCV 97.6 80.0 - 100.0 fL   MCH 30.9 26.0 - 34.0 pg   MCHC 31.7 (L) 32.0 - 36.0 g/dL   RDW 14.4 11.5 - 14.5 %   Platelets 217 150 - 440 K/uL   Neutrophils Relative % 67 %   Neutro Abs 5.7 1.4 - 6.5 K/uL   Lymphocytes Relative 23 %   Lymphs Abs 2.0 1.0 - 3.6 K/uL   Monocytes Relative 6 %   Monocytes Absolute 0.5 0.2 - 0.9 K/uL   Eosinophils Relative 3 %   Eosinophils Absolute 0.3 0 - 0.7 K/uL   Basophils Relative 1 %   Basophils Absolute 0.1 0 - 0.1 K/uL    Comment: Performed at [facility], [address], [place], [place] [address]  Basic metabolic panel     Status: Abnormal   Collection Time: 06/05/17  6:30 AM  Result Value Ref Range   Sodium 137 135 - 145 mmol/L   Potassium 3.3 (L) 3.5 - 5.1 mmol/L   Chloride 108 101 - 111 mmol/L   CO2 21 (  L) 22 - 32 mmol/L   Glucose, Bld 100 (H) 65 - 99 mg/dL   BUN 13 6 - 20 mg/dL   Creatinine, Ser 1.44 (H) 0.44 - 1.00 mg/dL   Calcium 7.1 (L) 8.9 - 10.3 mg/dL   GFR calc non Af Amer 34 (L) >60 mL/min   GFR calc Af Amer 39 (L) >60 mL/min    Comment: (NOTE) The eGFR has been calculated using the CKD EPI equation. This calculation has not been validated in all clinical situations. eGFR's persistently <60 mL/min signify possible Chronic Kidney Manning.    Anion gap 8 5 - 15    Comment: Performed at [facility], [address] [place], [place] [address]  Glucose, capillary     Status: None   Collection Time: 06/05/17  7:43 AM  Result Value Ref Range   Glucose-Capillary 88 65 - 99 mg/dL  Glucose, capillary     Status: None   Collection Time: 06/05/17 11:50 AM  Result Value  Ref Range   Glucose-Capillary 97 65 - 99 mg/dL   No components found for: ESR, C REACTIVE PROTEIN MICRO: Recent Results (from the past 720 hour(s))  Blood Culture (routine x 2)     Status: None   Collection Time: 05/28/17  2:03 PM  Result Value Ref Range Status   Specimen Description BLOOD LAC  Final   Special Requests   Final    BOTTLES DRAWN AEROBIC AND ANAEROBIC Blood Culture results may not be optimal due to an excessive volume of blood received in culture bottles   Culture   Final    NO GROWTH 5 DAYS Performed at [facility], [address], [place], [place] [address]    Report Status 06/02/2017 FINAL  Final  Blood Culture (routine x 2)     Status: None   Collection Time: 05/28/17  2:08 PM  Result Value Ref Range Status   Specimen Description BLOOD LFOA  Final   Special Requests   Final    BOTTLES DRAWN AEROBIC AND ANAEROBIC Blood Culture results may not be optimal due to an excessive volume of blood received in culture bottles   Culture   Final    NO GROWTH 5 DAYS Performed at [facility], [address], [place], [place] [address]    Report Status 06/02/2017 FINAL  Final  Aerobic/Anaerobic Culture (surgical/deep wound)     Status: None   Collection Time: 05/29/17  8:00 AM  Result Value Ref Range Status   Specimen Description   Final    ABSCESS Performed at [facility], [address], [place], [place] [address]    Special Requests   Final    Normal Performed at [facility], [address] [place], [place] [address]    Gram Stain   Final    RARE WBC PRESENT, PREDOMINANTLY PMN NO ORGANISMS SEEN    Culture   Final    No growth aerobically or anaerobically. Performed at [facility], [place] [address], [place], [place] [address]    Report Status 06/03/2017 FINAL  Final  C difficile quick scan w PCR reflex     Status: None   Collection Time: 05/30/17 11:50 AM  Result Value Ref Range Status   C Diff antigen NEGATIVE  NEGATIVE Final   C Diff toxin NEGATIVE NEGATIVE Final   C Diff interpretation No C. difficile detected.  Final    Comment: Performed at [facility], [address] [place], [place]  [address]  Aerobic/Anaerobic Culture (surgical/deep wound)     Status: None (Preliminary result)   Collection Time: 06/02/17  1:40 PM  Result Value Ref Range Status   Specimen Description   Final    NECK Performed at [facility], [address], [place], [place] [address]    Special Requests   Final    NONE Performed at [facility], [address] [place], [place] [address]    Gram Stain   Final    RARE WBC PRESENT, PREDOMINANTLY PMN RARE GRAM POSITIVE COCCI IN PAIRS Performed at [facility], [place] [address], [place],  [address]    Culture   Final    CULTURE REINCUBATED FOR BETTER GROWTH NO ANAEROBES ISOLATED; CULTURE IN PROGRESS FOR 5 DAYS    Report Status PENDING  Incomplete  MRSA PCR Screening     Status: None   Collection Time: 06/04/17 11:06 PM  Result Value Ref Range Status   MRSA by PCR NEGATIVE NEGATIVE Final    Comment:        The GeneXpert MRSA Assay (FDA approved for NASAL specimens only), is one component of a comprehensive MRSA colonization surveillance program. It is not intended to diagnose MRSA infection nor to guide or monitor treatment for MRSA infections. Performed at [facility], [address] [place], [place] [address]     IMAGING: Ct Soft Tissue Neck Wo Contrast  Result Date: 05/28/2017 CLINICAL DATA:  Left submandibular swelling, redness, and firmness for 2 weeks. EXAM: CT NECK WITHOUT CONTRAST TECHNIQUE: Multidetector CT imaging of the neck was performed following the standard protocol without intravenous contrast. COMPARISON:  10/06/2013 FINDINGS: Pharynx and larynx: Symmetric pharyngeal soft tissues without evidence of mass. Unremarkable larynx. No retropharyngeal fluid collection. Salivary glands: The large  complex fluid collection extending inferiorly from the right submandibular space on the prior CT is no longer present. There is a punctate calcification in the anterior right submandibular gland without evidence of acute inflammation. There is new prominent inflammatory change in the left submandibular space with suspected phlegmon extending anteriorly from and inseparable from the left submandibular gland. There may be a punctate calcification near the anteroinferior aspect of the left submandibular gland, however no submandibular ductal stones or dilatation is seen. The left platysma is thickened, and inflammatory reticulation extends into the overlying subcutaneous fat with associated skin thickening. No organized fluid collection is identified on this unenhanced study. The parotid glands are unremarkable. Thyroid: The subcentimeter left thyroid nodule on the prior study is not as well seen on the current examination and may have decreased in size. A 10 mm right thyroid nodule is more conspicuous on the current examination and may have slightly enlarged. Lymph nodes: Small left level IB and II lymph nodes are slightly larger than on the prior study, measuring up to 7 mm in short axis and likely reactive a 6 mm left level III lymph node is unchanged. Right level IB lymph nodes measure up to 8 mm in short axis. Vascular: The aortic arch atherosclerosis. Incidental average right subclavian artery, a normal variant. Mild calcified atherosclerosis at the right greater than left carotid bifurcations. Limited intracranial: Unremarkable. Visualized orbits: Bilateral cataract extraction. Mastoids and visualized paranasal sinuses: Clear Skeleton: All mandibular teeth are absent. Mild cervical disc degeneration. Upper chest: 3 mm apical right upper lobe nodule (series 5, image 90, previously 1.5 mm). New 2 mm anterior right upper lobe nodule (series 5, image 104). Increased size of a lobular left upper  lobe nodule measuring  14 x 12 mm (mean 13 mm, series 5, image 101, previously 11 x 9 mm). Other: None. IMPRESSION: 1. Prominent inflammation centered in the left submandibular space favored to reflect acute submandibular sialadenitis. No obstructing ductal stone identified. No organized fluid collection identified on this unenhanced study. 2. Interval resolution of complex right submandibular space collection on the 2015 CT. 3. Mild bilateral anterior cervical lymph node enlargement, most likely reactive. 4. Mild enlargement of a 13 mm left upper lobe lung nodule which remains concerning for primary bronchogenic carcinoma. Recommend further evaluation with PET-CT. 5. Mild enlargement of a 3 mm right upper lobe lung nodule. New 2 mm right upper lobe nodule. Electronically Signed   By: [place] Bores M.D.   On: 05/28/2017 15:01    Assessment:   Evelen Vazguez is a 77 y.o. female with hx R sided sialadenitis in 2015 now with L sided neck swelling and pain and found to have abscess, now s/p I and D. Culture from [place] neg, and fu 1/26 from I and D pending. She has lost about 15 #s and has had abnormal CT scan of chest in past.    Recommendations Cont unasyn and vanco Await cx I have sent AFB looking for mycobacterial process given recurrent and prolonged  nature of this process. Check CXR to fu on prior nodule noted on CT - she reports not having repeat CT scan in some time. Noted to still persist with 13 mm L UL lung nodule on CT neck and this will need fu as otpt if not evaluated further this admission.  Thank you very much for allowing me to participate in the care of this patient. Please call with questions.   Cheral Marker. Ola Spurr, MD

## 2017-06-05 NOTE — Progress Notes (Signed)
Nutrition Follow-Up Note  DOCUMENTATION CODES:   Severe malnutrition in context of acute illness/injury  INTERVENTION:  Will discontinue Glucerna order per patient request.   She is refusing any other oral nutrition supplements, but has been able to meet 100% of her calorie and protein needs from meals in the past 24 hours.   Provide daily MVI.  NUTRITION DIAGNOSIS:   Severe Malnutrition related to acute illness(AKI) as evidenced by mild fat depletion, moderate muscle depletion, energy intake < or equal to 50% for > or equal to 5 days.  Resolving.  GOAL:   Patient will meet greater than or equal to 90% of their needs  Met.  MONITOR:   PO intake, Supplement acceptance, Labs  REASON FOR ASSESSMENT:   Consult Poor PO, Wound healing  ASSESSMENT:   77 yo female with PMH significant for DM and HTN who presented to ED with large swelling to left lower jaw. Blood work shows leukocytosis and AKI with metabolic acidosis likely secondary to decreased oral intake and vomiting.  -Patient s/p bedside I&D of neck abscess on 1/26. -Noted diet was liberalized to regular today.  Met with patient and her husband at bedside. She reports her appetite is much better now and is back to baseline. She really dislikes the Glucerna and wants it discontinued. She does not want any other oral nutrition supplements as she reports they cause her to have diarrhea.  Meal Completion: now 80-100% In the past 24 hours patient has had approximately 1458 kcal (100% estimated needs) and 70 grams of protein (100% estimated needs).  Medications reviewed and include: Novolog 0-9 units TID, Novolog 0-5 units QHS, Novolog 3 units TID, Lantus 10 units daily, potassium chloride 40 mEq BID, NS @ 75 mL/hr, Unasyn.  Labs reviewed: CBG 88-154, Potassium 3.3, CO2 21, Creatinine 1.44.  Output not measured yesterday.  No weight since admission to trend.  Diet Order:  Diet regular Room service appropriate? Yes;  Fluid consistency: Thin  EDUCATION NEEDS:   No education needs have been identified at this time  Skin:  Skin Assessment: Skin Integrity Issues: Skin Integrity Issues:: Other (Comment) Other: open wound to left leg  Last BM:  06/03/2017 - small type 5  Height:   Ht Readings from Last 1 Encounters:  05/28/17 5' (1.524 m)    Weight:   Wt Readings from Last 1 Encounters:  05/28/17 128 lb (58.1 kg)    Ideal Body Weight:  45.5 kg  BMI:  Body mass index is 25 kg/m.  Estimated Nutritional Needs:   Kcal:  1300-1500 kcal/day  Protein:  70-85 grams/day  Fluid:  1.3-1.5 L/day  Willey Blade, MS, RD, LDN Office: [phone] Pager: [phone] After Hours/Weekend Pager: [phone]

## 2017-06-05 NOTE — Progress Notes (Signed)
PT Cancellation Note  Patient Details Name: Charlann LangeBarbara Liller MRN: [id number] DOB: 03/22/1941   Cancelled Treatment:    Reason Eval/Treat Not Completed: Patient declined, no reason specified. Chart reviewed. Attempted to see patient however she very pleasantly but emphatically refuses physical therapy. Therapist explained importance of PT evaluation however she continues to refuse. Pt states that she has been walking to the bathroom safely and husband confirms. Pt refuses getting up to recliner or walking to bathroom with therapy. Attempted for approximately 10 minutes to get patient to participate with physical therapy but she repeatedly refuses. Pt appears to be thinking clearly and is actually very pleasant in her refusal during conversation. This is the sixth time that pt has refused physical therapy and it appears highly unlikely that she will participate with therapy. RN or CNA may be able to provide some input regarding patients stability when transferring to bathroom. Will continue to attempt evaluation as pt is willing to participate.    Sharalyn InkJason D Anye Brose PT, DPT   Jontrell Bushong 06/05/2017, 3:36 PM

## 2017-06-05 NOTE — Progress Notes (Signed)
06/05/2017 8:08 AM  Charlann Lange [id number]  Post-Op Day 3    Temp:  [98.1 F (36.7 C)-98.6 F (37 C)] 98.1 F (36.7 C) (01/29 0531) Pulse Rate:  [86-108] 86 (01/29 0531) Resp:  [18] 18 (01/29 0531) BP: (105-146)/(58-84) 128/58 (01/29 0531) SpO2:  [98 %-99 %] 99 % (01/29 0531),     Intake/Output Summary (Last 24 hours) at 06/05/2017 0808 Last data filed at 06/05/2017 0313 Gross per 24 hour  Intake 1003.75 ml  Output -  Net 1003.75 ml    Results for orders placed or performed during the hospital encounter of 05/28/17 (from the past 24 hour(s))  Glucose, capillary     Status: Abnormal   Collection Time: 06/04/17 11:36 AM  Result Value Ref Range   Glucose-Capillary 149 (H) 65 - 99 mg/dL  Glucose, capillary     Status: Abnormal   Collection Time: 06/04/17  4:59 PM  Result Value Ref Range   Glucose-Capillary 154 (H) 65 - 99 mg/dL  Glucose, capillary     Status: None   Collection Time: 06/04/17 10:35 PM  Result Value Ref Range   Glucose-Capillary 70 65 - 99 mg/dL  MRSA PCR Screening     Status: None   Collection Time: 06/04/17 11:06 PM  Result Value Ref Range   MRSA by PCR NEGATIVE NEGATIVE  Glucose, capillary     Status: Abnormal   Collection Time: 06/04/17 11:55 PM  Result Value Ref Range   Glucose-Capillary 112 (H) 65 - 99 mg/dL  CBC with Differential/Platelet     Status: Abnormal   Collection Time: 06/05/17  6:30 AM  Result Value Ref Range   WBC 8.5 3.6 - 11.0 K/uL   RBC 2.83 (L) 3.80 - 5.20 MIL/uL   Hemoglobin 8.8 (L) 12.0 - 16.0 g/dL   HCT 11.9 (L) 14.7 - 82.9 %   MCV 97.6 80.0 - 100.0 fL   MCH 30.9 26.0 - 34.0 pg   MCHC 31.7 (L) 32.0 - 36.0 g/dL   RDW 56.2 13.0 - 86.5 %   Platelets 217 150 - 440 K/uL   Neutrophils Relative % 67 %   Neutro Abs 5.7 1.4 - 6.5 K/uL   Lymphocytes Relative 23 %   Lymphs Abs 2.0 1.0 - 3.6 K/uL   Monocytes Relative 6 %   Monocytes Absolute 0.5 0.2 - 0.9 K/uL   Eosinophils Relative 3 %   Eosinophils Absolute 0.3 0 - 0.7 K/uL    Basophils Relative 1 %   Basophils Absolute 0.1 0 - 0.1 K/uL  Basic metabolic panel     Status: Abnormal   Collection Time: 06/05/17  6:30 AM  Result Value Ref Range   Sodium 137 135 - 145 mmol/L   Potassium 3.3 (L) 3.5 - 5.1 mmol/L   Chloride 108 101 - 111 mmol/L   CO2 21 (L) 22 - 32 mmol/L   Glucose, Bld 100 (H) 65 - 99 mg/dL   BUN 13 6 - 20 mg/dL   Creatinine, Ser 7.84 (H) 0.44 - 1.00 mg/dL   Calcium 7.1 (L) 8.9 - 10.3 mg/dL   GFR calc non Af Amer 34 (L) >60 mL/min   GFR calc Af Amer 39 (L) >60 mL/min   Anion gap 8 5 - 15  Glucose, capillary     Status: None   Collection Time: 06/05/17  7:43 AM  Result Value Ref Range   Glucose-Capillary 88 65 - 99 mg/dL    SUBJECTIVE:  Feeling better  OBJECTIVE:  Iodoform gauze removed,  wound healing slowly, no abscess noted  IMPRESSION:  S/p I & D of neck abscess, packing removed.  Culture ID and sensitivies still pending  PLAN:  Packing removed, neck healing.  I would recommend continue IV meds until sensitivities are finished and appropriate PO meds can be given.  Once that is established, I see no reason she can't go home.  Routine wound care at home.  F/U with me as outpatient in 1 week.    Davina PokeChapman T Emmalin Jaquess 06/05/2017, 8:08 AM

## 2017-06-06 LAB — BASIC METABOLIC PANEL
Anion gap: 8 (ref 5–15)
BUN: 10 mg/dL (ref 6–20)
CALCIUM: 7.4 mg/dL — AB (ref 8.9–10.3)
CO2: 21 mmol/L — ABNORMAL LOW (ref 22–32)
CREATININE: 1.38 mg/dL — AB (ref 0.44–1.00)
Chloride: 109 mmol/L (ref 101–111)
GFR calc Af Amer: 42 mL/min — ABNORMAL LOW (ref >60)
GFR, EST NON AFRICAN AMERICAN: 36 mL/min — AB (ref >60)
Glucose, Bld: 86 mg/dL (ref 65–99)
Potassium: 3.4 mmol/L — ABNORMAL LOW (ref 3.5–5.1)
SODIUM: 138 mmol/L (ref 135–145)

## 2017-06-06 LAB — CBC WITH DIFFERENTIAL/PLATELET
BASOS PCT: 1 %
Basophils Absolute: 0.1 10*3/uL (ref 0–0.1)
EOS ABS: 0.3 10*3/uL (ref 0–0.7)
EOS PCT: 4 %
HCT: 28.7 % — ABNORMAL LOW (ref 35.0–47.0)
Hemoglobin: 9.3 g/dL — ABNORMAL LOW (ref 12.0–16.0)
LYMPHS ABS: 1.8 10*3/uL (ref 1.0–3.6)
Lymphocytes Relative: 27 %
MCH: 31.7 pg (ref 26.0–34.0)
MCHC: 32.3 g/dL (ref 32.0–36.0)
MCV: 98.3 fL (ref 80.0–100.0)
MONOS PCT: 6 %
Monocytes Absolute: 0.4 10*3/uL (ref 0.2–0.9)
Neutro Abs: 4.2 10*3/uL (ref 1.4–6.5)
Neutrophils Relative %: 62 %
PLATELETS: 199 10*3/uL (ref 150–440)
RBC: 2.92 MIL/uL — ABNORMAL LOW (ref 3.80–5.20)
RDW: 14.5 % (ref 11.5–14.5)
WBC: 6.8 10*3/uL (ref 3.6–11.0)

## 2017-06-06 LAB — GLUCOSE, CAPILLARY
GLUCOSE-CAPILLARY: 82 mg/dL (ref 65–99)
GLUCOSE-CAPILLARY: 110 mg/dL — AB (ref 65–99)

## 2017-06-06 LAB — VANCOMYCIN, TROUGH: Vancomycin Tr: 19 ug/mL (ref 15–20)

## 2017-06-06 MED ORDER — AMOXICILLIN-POT CLAVULANATE 875-125 MG PO TABS: 1.0000 | ORAL_TABLET | Freq: Two times a day (BID) | ORAL | Status: DC

## 2017-06-06 MED ORDER — POTASSIUM CHLORIDE 10 MEQ/100ML IV SOLN: 10.0000 meq | INTRAVENOUS | Status: AC

## 2017-06-06 MED ORDER — TRAMADOL HCL 50 MG PO TABS: 50.0000 mg | ORAL_TABLET | Freq: Four times a day (QID) | ORAL | 0 refills | Status: DC | PRN

## 2017-06-06 MED ORDER — AMOXICILLIN-POT CLAVULANATE 875-125 MG PO TABS: 1.0000 | ORAL_TABLET | Freq: Two times a day (BID) | ORAL | 0 refills | Status: DC

## 2017-06-06 MED ORDER — ADULT MULTIVITAMIN W/MINERALS CH: 1.0000 | ORAL_TABLET | Freq: Every day | ORAL | Status: DC

## 2017-06-06 NOTE — Discharge Instructions (Addendum)
F/U WITH PCP in  a week Op wound care Follow-up with infectious disease in 10 days Follow-up with ENT Dr. Jenne CampusMcQueen in 1 week

## 2017-06-06 NOTE — Progress Notes (Signed)
Patient's spouse demonstrated with teachback how to change patient's dressing on her neck. Spouse changed dressing in front of nurse and all questions answered. Patient educated on need to to change dressing 3 times per day with NS and peroxide per Dr. Jenne CampusMcQueen. Basic supplies needed for dressing change given to patient until patient able to make it pharmacy to purchase more.

## 2017-06-06 NOTE — Progress Notes (Signed)
[facility] CLINIC INFECTIOUS DISEASE PROGRESS NOTE Date of Admission:  05/28/2017     ID: Tiffany Manning is a 77 y.o. female with  Neck abscess Principal Problem:   Acute renal failure (HCC) Active Problems:   Acute renal failure (ARF) (HCC)   Protein-calorie malnutrition, severe   Subjective: No fevers, was angry and wanted to go home but feels better now   ROS  Eleven systems are reviewed and negative except per hpi  Medications:  Antibiotics Given (last 72 hours)    Date/Time Action Medication Dose Rate   06/03/17 1742 New Bag/Given   piperacillin-tazobactam (ZOSYN) IVPB 3.375 g 3.375 g 12.5 mL/hr   06/04/17 0104 New Bag/Given   vancomycin (VANCOCIN) IVPB 1000 mg/200 mL premix 1,000 mg 200 mL/hr   06/04/17 0119 New Bag/Given   piperacillin-tazobactam (ZOSYN) IVPB 3.375 g 3.375 g 12.5 mL/hr   06/04/17 1114 New Bag/Given   piperacillin-tazobactam (ZOSYN) IVPB 3.375 g 3.375 g 12.5 mL/hr   06/04/17 2013 New Bag/Given   piperacillin-tazobactam (ZOSYN) IVPB 3.375 g 3.375 g 12.5 mL/hr   06/05/17 0133 New Bag/Given   piperacillin-tazobactam (ZOSYN) IVPB 3.375 g 3.375 g 12.5 mL/hr   06/05/17 1239 New Bag/Given   Ampicillin-Sulbactam (UNASYN) 3 g in sodium chloride 0.9 % 100 mL IVPB 3 g 200 mL/hr   06/06/17 0027 New Bag/Given   Ampicillin-Sulbactam (UNASYN) 3 g in sodium chloride 0.9 % 100 mL IVPB 3 g 200 mL/hr   06/06/17 0120 New Bag/Given   vancomycin (VANCOCIN) IVPB 1000 mg/200 mL premix 1,000 mg 200 mL/hr   06/06/17 1155 New Bag/Given   Ampicillin-Sulbactam (UNASYN) 3 g in sodium chloride 0.9 % 100 mL IVPB 3 g 200 mL/hr     . heparin  5,000 Units Subcutaneous Q8H  . hydrogen peroxide   Topical BID  . insulin aspart  0-5 Units Subcutaneous QHS  . insulin aspart  0-9 Units Subcutaneous TID WC  . insulin aspart  3 Units Subcutaneous TID WC  . insulin glargine  10 Units Subcutaneous Daily  . multivitamin with minerals  1 tablet Oral Daily  . mupirocin ointment   Topical Daily   . potassium chloride  40 mEq Oral BID  . simvastatin  40 mg Oral Daily    Objective: Vital signs in last 24 hours: Temp:  [97.4 F (36.3 C)-98.4 F (36.9 C)] 97.6 F (36.4 C) (01/30 1031) Pulse Rate:  [81-105] 95 (01/30 1031) Resp:  [14-18] 14 (01/30 1031) BP: (110-130)/(55-64) 130/57 (01/30 1031) SpO2:  [99 %-100 %] 100 % (01/30 1031) Constitutional:  oriented to person, place, and time. appears well-developed and well-nourished. No distress.  HENT: [place]/AT, PERRLA,  L neck with golfball sized area of induration with open wound draining some white yellow purulence Mouth/Throat: Oropharynx is clear and moist. No oropharyngeal exudate.  Cardiovascular: Normal rate, regular rhythm and normal heart sounds.  Pulmonary/Chest: Effort normal and breath sounds normal. No respiratory distress.  has no wheezes.  Neck = supple, no nuchal rigidity Abdominal: Soft. Bowel sounds are normal.  exhibits no distension. There is no tenderness.  Lymphadenopathy: no cervical adenopathy. No axillary adenopathy Neurological: alert and oriented to person, place, and time.  Skin: Skin is warm and dry. No rash noted. No erythema.  Psychiatric: a normal mood and affect.  behavior is normal.     Lab Results Recent Labs    06/05/17 0630 06/06/17 0710  WBC 8.5 6.8  HGB 8.8* 9.3*  HCT 27.7* 28.7*  NA 137 138  K 3.3* 3.4*  CL 108 109  CO2 21* 21*  BUN 13 10  CREATININE 1.44* 1.38*    Microbiology: Results for orders placed or performed during the hospital encounter of 05/28/17  Blood Culture (routine x 2)     Status: None   Collection Time: 05/28/17  2:03 PM  Result Value Ref Range Status   Specimen Description BLOOD LAC  Final   Special Requests   Final    BOTTLES DRAWN AEROBIC AND ANAEROBIC Blood Culture results may not be optimal due to an excessive volume of blood received in culture bottles   Culture   Final    NO GROWTH 5 DAYS Performed at [facility], [address]  [place], [place] [address]    Report Status 06/02/2017 FINAL  Final  Blood Culture (routine x 2)     Status: None   Collection Time: 05/28/17  2:08 PM  Result Value Ref Range Status   Specimen Description BLOOD LFOA  Final   Special Requests   Final    BOTTLES DRAWN AEROBIC AND ANAEROBIC Blood Culture results may not be optimal due to an excessive volume of blood received in culture bottles   Culture   Final    NO GROWTH 5 DAYS Performed at [facility], [address] [place], [place] [address]    Report Status 06/02/2017 FINAL  Final  Aerobic/Anaerobic Culture (surgical/deep wound)     Status: None   Collection Time: 05/29/17  8:00 AM  Result Value Ref Range Status   Specimen Description   Final    ABSCESS Performed at [facility], [address] [place], [place] [address]    Special Requests   Final    Normal Performed at [facility], [address] [place], [place] [address]    Gram Stain   Final    RARE WBC PRESENT, PREDOMINANTLY PMN NO ORGANISMS SEEN    Culture   Final    No growth aerobically or anaerobically. Performed at [facility], [address] [place], [place] [address]    Report Status 06/03/2017 FINAL  Final  C difficile quick scan w PCR reflex     Status: None   Collection Time: 05/30/17 11:50 AM  Result Value Ref Range Status   C Diff antigen NEGATIVE NEGATIVE Final   C Diff toxin NEGATIVE NEGATIVE Final   C Diff interpretation No C. difficile detected.  Final    Comment: Performed at [facility], [address] [place], [place] [address]  Aerobic/Anaerobic Culture (surgical/deep wound)     Status: None (Preliminary result)   Collection Time: 06/02/17  1:40 PM  Result Value Ref Range Status   Specimen Description   Final    NECK Performed at [facility], [address] [place], [place] [address]    Special Requests   Final    NONE Performed at [facility], [address]  [place], [place] [address]    Gram Stain   Final    RARE WBC PRESENT, PREDOMINANTLY PMN RARE GRAM POSITIVE COCCI IN PAIRS Performed at [facility], [address] [place], [place] [address]    Culture   Final    RARE VIRIDANS STREPTOCOCCUS NO ANAEROBES ISOLATED; CULTURE IN PROGRESS FOR 5 DAYS    Report Status PENDING  Incomplete  MRSA PCR Screening     Status: None   Collection Time: 06/04/17 11:06 PM  Result Value Ref Range Status   MRSA by PCR  NEGATIVE NEGATIVE Final    Comment:        The GeneXpert MRSA Assay (FDA approved for NASAL specimens only), is one component of a comprehensive MRSA colonization surveillance program. It is not intended to diagnose MRSA infection nor to guide or monitor treatment for MRSA infections. Performed at [facility], [address], [place], [place] [address]     Studies/Results: No results found.  Assessment/Plan: Tiffany Manning is a 77 y.o. female with hx R sided sialadenitis in 2015 now with L sided neck swelling and pain and found to have abscess, now s/p I and D. Culture from admisison neg, and fu 1/26 from I and D pending. She has lost about 15 #s and has had abnormal CT scan of chest in past.  1/30  cx with viridans strep  Discussed with her and her husband her small pulm nodule and they said it has been there for years and is very stable.  Recommendations Change to oral augmentin for 14 more days  Have asked lab to check sensitivities for the Viridans strep.  I have sent AFB looking for mycobacterial process given recurrent and prolonged  nature of this process.  I CAN see in clinic in 10 -14 days Thank you very much for the consult. Will follow with you.  Mick Sell   06/06/2017, 1:51 PM

## 2017-06-06 NOTE — Discharge Summary (Signed)
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 Diabetes mellitus without complication (HCC)   . Hypertension     HOSPITAL COURSE:   HISTORY OF PRESENT ILLNESS: Tiffany Manning  is a 77 y.o. female with a known history of Htn, DM- for last one week have nausea and vomiting with decreased oral intake and some pain in her left mandibular salivary gland. She went to primary care physician's office and he had called ENT doctor and suggested to go to emergency room today. She complains that today she started having increasing swelling on her upper part of the neck and her jaw. Denies any fever or chills. ER physician had done CT scan soft tissue of the neck which did not show any stone. But showed infection and localized collection. In ER physician spoke to ENT surgeon and he suggested to give IV antibiotics and steroids for now.  # Abscess left neck.     Initial culture negative.  Appreciate ENT consultation, Dr. Jenne Campus did incision and drainage at bedside and repeat wound cultures from January 26 revealing gram-positive cocci in pairs , reintubated for better growth .  Initially patient was on Zosyn and vancomycin and antibiotics changed to Unasyn and will continue vancomycin wound was packed with gauze and covered with dressing.  The drain was removed by ENT  ID recommendations  appreciated, recommending to continue IV antibiotics and await for culture results.  AFB was sent  for mycobacterial process giving recurrent and prolonged nature  Blood cultures are negative. pcp to f/u on final culture results Op f/u with ID in 10 days Discharge patient with p.o. Augmentin for 14 days as recommended by infectious disease Continue wound care   #Acute kidney injury improved with IV fluids.  Baseline creatinine 1.23. Creatinine 1.51-1.48 --1.47-1.51--1.4 -- 1.38 improved with IV fluids and avoid nephrotoxins  #Possible depression versus anhedonia: We will consult psychiatry  #Hypokalemia.  Maxide is not a good medication for this patient.  Potassium replaced.  Potassium at 3.6 today  #Type 2 diabetes mellitus.  Hemoglobin A1c is actually 7.0.    Resume home medications    #Nonhealing wound of the left leg.  Appreciate wound care consultation.  Will likely need wound care center as outpatient.  Outpatient wound care follow-up  #Left upper lobe lung nodule noted on chest x-ray-patient needs outpatient CT chest.  PCP to follow-up  #Hyperlipidemia unspecified on simvastatin  # Essential hypertension.  Blood pressure seems okay and we are holding medications at this point  #Anemia -= hemoglobin 10.6 --9.5 continue close monitoring  #Urinalysis looks positive.  Antibiotics would cover.  # Generalized weakness PT consulted but declined by patient multiple times        DISCHARGE CONDITIONS:   fair  CONSULTS OBTAINED:  Treatment Team:  Vernie Murders, MD Linus Salmons, MD Mick Sell, MD   PROCEDURES  None   DRUG ALLERGIES:  Allergies  Allergen Reactions  . Sulfa Antibiotics Nausea And Vomiting    DISCHARGE MEDICATIONS:   Allergies as of 06/06/2017      Reactions   Sulfa Antibiotics Nausea And Vomiting      Medication List    STOP taking these medications   clindamycin 150 MG capsule Commonly known as:  CLEOCIN    triamterene-hydrochlorothiazide 37.5-25 MG tablet Commonly known as:  MAXZIDE-25     TAKE these medications   acetaminophen 650 MG CR tablet Commonly known as:  TYLENOL Take 650 mg by mouth every 8 (eight) hours as needed for pain.   amoxicillin-clavulanate 875-125 MG tablet Commonly known as:  AUGMENTIN Take 1 tablet by mouth every 12 (twelve) hours.   atenolol 50 MG tablet Commonly known as:  TENORMIN Take 50 mg by mouth 2 (two) times daily.   glipiZIDE-metformin 5-500 MG tablet Commonly known as:  METAGLIP Take 1 tablet by mouth 2 (two) times daily.   multivitamin with minerals Tabs tablet Take 1 tablet by mouth daily. Start taking on:  06/07/2017   simvastatin 40 MG tablet Commonly known as:  ZOCOR Take 40 mg by mouth daily.   traMADol 50 MG tablet Commonly known as:  ULTRAM Take 1 tablet (50 mg total) by mouth every 6 (six) hours as needed. What changed:  reasons to take this        DISCHARGE INSTRUCTIONS:    F/U WITH PCP in  a week wound care, change wound dressings 3 times a day Follow-up with infectious disease in 10 days Follow-up with ENT Dr. Jenne Campus in 1 week  DIET:  Diabetic diet  DISCHARGE CONDITION:  Fair  ACTIVITY:  Activity as tolerated, pt refused PT   OXYGEN:  Home Oxygen: No.   Oxygen Delivery: room air  DISCHARGE LOCATION:  home   If you experience worsening of your admission symptoms, develop shortness of breath, life threatening emergency, suicidal or homicidal thoughts you must seek medical attention immediately by calling 911 or calling your MD immediately  if symptoms less severe.  You Must read complete instructions/literature along with all the possible adverse reactions/side effects for all the Medicines you take and that have been prescribed to you. Take any new Medicines after you have completely understood and accpet all the possible adverse reactions/side effects.   Please note  You were cared for by a hospitalist  during your hospital stay. If you have any questions about your discharge medications or the care you received while you were in the hospital after you are discharged, you can call the unit and asked to speak with the hospitalist on call if the hospitalist that took care of you is not available. Once you are discharged, your primary care physician will handle any further medical issues. Please note that NO REFILLS for any discharge medications will be authorized once you are discharged, as it is imperative that you return to your primary care physician (or establish a relationship with a primary care physician if you do not have one) for your aftercare needs so that they can reassess your need for medications and monitor your lab values.     Today  Chief Complaint  Patient presents with  . Facial Swelling   Patient is feeling fine.  Wants to be discharged.  Okay to discharge patient from ENT and ID standpoint.  ROS:  CONSTITUTIONAL: Denies fevers, chills. Denies any fatigue, weakness.  EYES: Denies blurry vision, double vision, eye pain. EARS, NOSE, THROAT: Denies tinnitus, ear pain,  hearing loss. RESPIRATORY: Denies cough, wheeze, shortness of breath.  CARDIOVASCULAR: Denies chest pain, palpitations, edema.  GASTROINTESTINAL: Denies nausea, vomiting, diarrhea, abdominal pain. Denies bright red blood per rectum. GENITOURINARY: Denies dysuria, hematuria. ENDOCRINE: Denies nocturia or thyroid problems. HEMATOLOGIC AND LYMPHATIC: Denies easy bruising or bleeding. SKIN: Denies rash or lesion.  Left side of the neck with swelling MUSCULOSKELETAL: Denies pain in neck, back, shoulder, knees, hips or arthritic symptoms.  NEUROLOGIC: Denies paralysis, paresthesias.  PSYCHIATRIC: Denies anxiety or depressive symptoms.   VITAL SIGNS:  Blood pressure 127/75, pulse (!) 108, temperature 98 F (36.7 C), temperature source Oral, resp. rate 14, height 5' (1.524 m), weight 58.1 kg (128 lb), SpO2 96  %.  I/O:    Intake/Output Summary (Last 24 hours) at 06/06/2017 1532 Last data filed at 06/06/2017 1300 Gross per 24 hour  Intake 660 ml  Output -  Net 660 ml    PHYSICAL EXAMINATION:  GENERAL:  77 y.o.-year-old patient lying in the bed with no acute distress.  EYES: Pupils equal, round, reactive to light and accommodation. No scleral icterus. Extraocular muscles intact.  HEENT: Head atraumatic, normocephalic. Oropharynx and nasopharynx clear.  NECK:  Supple, no jugular venous distention.  left lateral aspect of the neck with abscess status post incision and drainage with clean dressing  LUNGS: Normal breath sounds bilaterally, no wheezing, rales,rhonchi or crepitation. No use of accessory muscles of respiration.  CARDIOVASCULAR: S1, S2 normal. No murmurs, rubs, or gallops.  ABDOMEN: Soft, non-tender, non-distended. Bowel sounds present. No organomegaly or mass.  EXTREMITIES: No pedal edema, cyanosis, or clubbing.  NEUROLOGIC: Cranial nerves II through XII are intact. Muscle strength 5/5 in all extremities. Sensation intact. Gait not checked.  PSYCHIATRIC: The patient is alert and oriented x 3.  SKIN: No obvious rash, lesion, or ulcer.   DATA REVIEW:   CBC Recent Labs  Lab 06/06/17 0710  WBC 6.8  HGB 9.3*  HCT 28.7*  PLT 199    Chemistries  Recent Labs  Lab 06/03/17 0407  06/06/17 0710  NA 140   < > 138  K 3.6   < > 3.4*  CL 108   < > 109  CO2 22   < > 21*  GLUCOSE 208*   < > 86  BUN 21*   < > 10  CREATININE 1.57*   < > 1.38*  CALCIUM 7.6*   < > 7.4*  MG 2.1  --   --    < > = values in this interval not displayed.    Cardiac Enzymes No results for input(s): TROPONINI in the last 168 hours.  Microbiology Results  Results for orders placed or performed during the hospital encounter of 05/28/17  Blood Culture (routine x 2)     Status: None   Collection Time: 05/28/17  2:03 PM  Result Value Ref Range Status   Specimen Description BLOOD LAC  Final   Special  Requests   Final    BOTTLES DRAWN AEROBIC AND ANAEROBIC Blood Culture results may not be optimal due to an excessive volume of blood received in culture bottles   Culture   Final    NO GROWTH 5 DAYS Performed at [facility], [address] [place], [place] [address]    Report Status 06/02/2017 FINAL  Final  Blood Culture (routine x 2)     Status: None   Collection Time: 05/28/17  2:08 PM  Result Value Ref Range Status   Specimen Description BLOOD LFOA  Final  Special Requests   Final    BOTTLES DRAWN AEROBIC AND ANAEROBIC Blood Culture results may not be optimal due to an excessive volume of blood received in culture bottles   Culture   Final    NO GROWTH 5 DAYS Performed at [facility], [address], [place], [place] [address]    Report Status 06/02/2017 FINAL  Final  Aerobic/Anaerobic Culture (surgical/deep wound)     Status: None   Collection Time: 05/29/17  8:00 AM  Result Value Ref Range Status   Specimen Description   Final    ABSCESS Performed at [facility], [address] [place], [place] [address]    Special Requests   Final    Normal Performed at [facility], [address] [place], [place] [address]    Gram Stain   Final    RARE WBC PRESENT, PREDOMINANTLY PMN NO ORGANISMS SEEN    Culture   Final    No growth aerobically or anaerobically. Performed at [facility], [address] [place], [place] [address]    Report Status 06/03/2017 FINAL  Final  C difficile quick scan w PCR reflex     Status: None   Collection Time: 05/30/17 11:50 AM  Result Value Ref Range Status   C Diff antigen NEGATIVE NEGATIVE Final   C Diff toxin NEGATIVE NEGATIVE Final   C Diff interpretation No C. difficile detected.  Final    Comment: Performed at [facility], [address] [place], [place] [address]  Aerobic/Anaerobic Culture (surgical/deep wound)     Status: None (Preliminary result)   Collection Time: 06/02/17   1:40 PM  Result Value Ref Range Status   Specimen Description   Final    NECK Performed at [facility], [address] [place], [place] [address]    Special Requests   Final    NONE Performed at [facility], [address] [place], [place] [address]    Gram Stain   Final    RARE WBC PRESENT, PREDOMINANTLY PMN RARE GRAM POSITIVE COCCI IN PAIRS    Culture   Final    RARE VIRIDANS STREPTOCOCCUS NO ANAEROBES ISOLATED; CULTURE IN PROGRESS FOR 5 DAYS SUSCEPTIBILITIES TO FOLLOW Performed at [facility], [address], [place], [place] [address]    Report Status PENDING  Incomplete  MRSA PCR Screening     Status: None   Collection Time: 06/04/17 11:06 PM  Result Value Ref Range Status   MRSA by PCR NEGATIVE NEGATIVE Final    Comment:        The GeneXpert MRSA Assay (FDA approved for NASAL specimens only), is one component of a comprehensive MRSA colonization surveillance program. It is not intended to diagnose MRSA infection nor to guide or monitor treatment for MRSA infections. Performed at [facility], [address] [place], [place] [address]     RADIOLOGY:  No results found.  EKG:   Orders placed or performed in visit on 05/24/06  . EKG 12-Lead      Management plans discussed with the patient, family and they are in agreement.  CODE STATUS:     Code Status Orders  (From admission, onward)        Start     Ordered   05/28/17 1638  Full code  Continuous     05/28/17 1637    Code Status History    Date Active Date Inactive Code Status Order ID Comments User  Context   This patient has a current code status but no historical code status.      TOTAL TIME TAKING CARE OF THIS PATIENT:  43  minutes.   Note: This dictation was prepared with Dragon dictation along with smaller phrase technology. Any transcriptional errors that result from this process are unintentional.   @MEC @  on 06/06/2017 at 3:32 PM  Between  7am to 6pm - Pager - [phone]  After 6pm go to [email or web address] - password [email or web address] [facility]  [facility]  Office  [phone]  CC: Primary care physician; Jaclyn Shaggy, MD

## 2017-06-06 NOTE — Progress Notes (Signed)
Patient refusing IV and PO potassium, MD and pharmacist made aware. Patient wanting to go home. MD- Gouru aware, states to page ID, Dr. Sampson GoonFitzgerald.

## 2017-06-06 NOTE — Consult Note (Signed)
MEDICATION RELATED CONSULT NOTE   Pharmacy Consult for electrolyes Indication: hypokalemia  Allergies  Allergen Reactions  . Sulfa Antibiotics Nausea And Vomiting    Patient Measurements: Height: 5' (152.4 cm) Weight: 128 lb (58.1 kg) IBW/kg (Calculated) : 45.5 Adjusted Body Weight:   Vital Signs: Temp: 97.4 F (36.3 C) (01/30 0325) Temp Source: Oral (01/30 0325) BP: 111/64 (01/30 0325) Pulse Rate: 105 (01/30 0325) Intake/Output from previous day: 01/29 0701 - 01/30 0700 In: 1261.3 [P.O.:240; I.V.:521.3; IV Piggyback:500] Out: -  Intake/Output from this shift: No intake/output data recorded.  Labs: Recent Labs    06/04/17 0330 06/05/17 0630 06/06/17 0710  WBC 11.3* 8.5 6.8  HGB 9.2* 8.8* 9.3*  HCT 29.0* 27.7* 28.7*  PLT 261 217 199  CREATININE 1.51* 1.44* 1.38*   Estimated Creatinine Clearance: 27.2 mL/min (A) (by C-G formula based on SCr of 1.38 mg/dL (H)).  Assessment: Patient is a 77 year old female admitted with sialoadenitis. Pt was hypokalemic on admission.   Goal of Therapy:  Normalization of electrolytes  Plan:  1/28: K 3.4. Patient has KCL 40mEq ordered BID but she has been refusing to take it since 1/26.  Will order KCL 10mEq IV x 2  Will F/U with AM labs and continue to replace as needed.  Gardner CandleSheema M Auther Lyerly, PharmD, BCPS Clinical Pharmacist 06/06/2017 9:22 AM

## 2017-06-06 NOTE — Progress Notes (Signed)
Pharmacy Antibiotic Note  Tiffany Manning is a 77 y.o. female admitted on 05/28/2017 with abscess of left neck.  Pharmacy now consulted for Unasyn and Vanc dosing. Pt also with AKI.   Plan: Vanc day 9. Continue Vancomycin 1 gram q 48 hours. Check level before 2nd new dose to ensure clearance.  Start Unasyn 3g IV every 12 hours based on current CrCl <5030ml/min. Will monitor renal function and adjust dose as needed.  01/30 @ 0100 VT 19 therapeutic. Drawn appropriately, will continue current dose and will recheck next trough 02/05 @ 0100. Renal function slightly improving.  Height: 5' (152.4 cm) Weight: 128 lb (58.1 kg) IBW/kg (Calculated) : 45.5  Temp (24hrs), Avg:98.1 F (36.7 C), Min:97.8 F (36.6 C), Max:98.4 F (36.9 C)  Recent Labs  Lab 06/01/17 0012 06/01/17 0650 06/02/17 0401  06/03/17 0407 06/03/17 2215 06/04/17 0330 06/05/17 0630 06/06/17 0108  WBC  --  13.1* 13.6*  --  13.5*  --  11.3* 8.5  --   CREATININE  --  1.51* 1.48*  --  1.57*  --  1.51* 1.44*  --   VANCOTROUGH 20  --   --   --   --   --   --   --  19  VANCORANDOM  --   --   --    < > 24 18  --   --   --    < > = values in this interval not displayed.    Estimated Creatinine Clearance: 26.1 mL/min (A) (by C-G formula based on SCr of 1.44 mg/dL (H)).    Allergies  Allergen Reactions  . Sulfa Antibiotics Nausea And Vomiting    Antimicrobials this admission: Vancomycin + piperacillin/tazobactam 1/21 >>1/29 Unasyn 1/29>>   Dose adjustments this admission: 1/22  Random Vanc= 10 mcg/ml (24 hrs after 1st dose): change Vanc 1 gram q48 to 1 gram q36h 1/25 Vancomycin 1000 mg IV q36 - will d/c regimen and check random level  1/26 PM vanc level 27. Will recheck random with AM labs. 1/27 AM vanc level 24. Recheck with tomorrow AM labs. No dose ordered.  Microbiology results: 1/21 BCx: No growth x 4 days  Thank you for allowing pharmacy to be a part of this patient's care.  Thomasene Rippleavid  Efrat Zuidema, PharmD,  BCPS Clinical Pharmacist 06/06/2017 2:34 AM

## 2017-06-07 LAB — AEROBIC/ANAEROBIC CULTURE (SURGICAL/DEEP WOUND)

## 2017-06-07 LAB — AEROBIC/ANAEROBIC CULTURE W GRAM STAIN (SURGICAL/DEEP WOUND)

## 2017-06-07 LAB — QUANTIFERON-TB GOLD PLUS (RQFGPL)
QUANTIFERON NIL VALUE: 0.05 [IU]/mL
QUANTIFERON TB1 AG VALUE: 0.04 [IU]/mL
QuantiFERON Mitogen Value: 0.31 IU/mL
QuantiFERON TB2 Ag Value: 0.03 IU/mL

## 2017-06-07 LAB — ACID FAST SMEAR (AFB, MYCOBACTERIA): Acid Fast Smear: NEGATIVE

## 2017-06-07 LAB — QUANTIFERON-TB GOLD PLUS: QUANTIFERON-TB GOLD PLUS: UNDETERMINED

## 2017-06-18 DIAGNOSIS — E119 Type 2 diabetes mellitus without complications: Secondary | ICD-10-CM | POA: Insufficient documentation

## 2017-06-18 DIAGNOSIS — E7800 Pure hypercholesterolemia, unspecified: Secondary | ICD-10-CM | POA: Insufficient documentation

## 2017-06-18 DIAGNOSIS — I10 Essential (primary) hypertension: Secondary | ICD-10-CM | POA: Insufficient documentation

## 2017-06-18 DIAGNOSIS — L0211 Cutaneous abscess of neck: Secondary | ICD-10-CM | POA: Insufficient documentation

## 2017-07-10 DIAGNOSIS — S81802A Unspecified open wound, left lower leg, initial encounter: Secondary | ICD-10-CM | POA: Insufficient documentation

## 2017-07-21 LAB — ACID FAST CULTURE WITH REFLEXED SENSITIVITIES (MYCOBACTERIA): Acid Fast Culture: NEGATIVE

## 2017-07-21 LAB — ACID FAST CULTURE WITH REFLEXED SENSITIVITIES

## 2018-01-27 ENCOUNTER — Encounter: Payer: Self-pay | Admitting: Specialist

## 2018-01-27 ENCOUNTER — Inpatient Hospital Stay
Admission: EM | Admit: 2018-01-27 | Discharge: 2018-02-06 | DRG: 853 | Disposition: A | Discharge status: Skilled Nursing Facility | Payer: Medicare HMO | Attending: Internal Medicine | Admitting: Internal Medicine

## 2018-01-27 ENCOUNTER — Inpatient Hospital Stay: Payer: Medicare HMO

## 2018-01-27 ENCOUNTER — Other Ambulatory Visit: Payer: Self-pay

## 2018-01-27 ENCOUNTER — Emergency Department: Payer: Medicare HMO

## 2018-01-27 DIAGNOSIS — E876 Hypokalemia: Secondary | ICD-10-CM | POA: Diagnosis present

## 2018-01-27 DIAGNOSIS — E43 Unspecified severe protein-calorie malnutrition: Secondary | ICD-10-CM | POA: Diagnosis present

## 2018-01-27 DIAGNOSIS — K922 Gastrointestinal hemorrhage, unspecified: Secondary | ICD-10-CM | POA: Diagnosis present

## 2018-01-27 DIAGNOSIS — R609 Edema, unspecified: Secondary | ICD-10-CM

## 2018-01-27 DIAGNOSIS — I493 Ventricular premature depolarization: Secondary | ICD-10-CM | POA: Diagnosis present

## 2018-01-27 DIAGNOSIS — E870 Hyperosmolality and hypernatremia: Secondary | ICD-10-CM | POA: Diagnosis present

## 2018-01-27 DIAGNOSIS — N189 Chronic kidney disease, unspecified: Secondary | ICD-10-CM | POA: Diagnosis not present

## 2018-01-27 DIAGNOSIS — N329 Bladder disorder, unspecified: Secondary | ICD-10-CM | POA: Diagnosis present

## 2018-01-27 DIAGNOSIS — A419 Sepsis, unspecified organism: Secondary | ICD-10-CM | POA: Diagnosis not present

## 2018-01-27 DIAGNOSIS — R911 Solitary pulmonary nodule: Secondary | ICD-10-CM | POA: Diagnosis present

## 2018-01-27 DIAGNOSIS — N170 Acute kidney failure with tubular necrosis: Secondary | ICD-10-CM | POA: Diagnosis present

## 2018-01-27 DIAGNOSIS — E860 Dehydration: Secondary | ICD-10-CM | POA: Diagnosis present

## 2018-01-27 DIAGNOSIS — A4151 Sepsis due to Escherichia coli [E. coli]: Secondary | ICD-10-CM | POA: Diagnosis present

## 2018-01-27 DIAGNOSIS — R0602 Shortness of breath: Secondary | ICD-10-CM

## 2018-01-27 DIAGNOSIS — Z90710 Acquired absence of both cervix and uterus: Secondary | ICD-10-CM

## 2018-01-27 DIAGNOSIS — N179 Acute kidney failure, unspecified: Secondary | ICD-10-CM

## 2018-01-27 DIAGNOSIS — N183 Chronic kidney disease, stage 3 (moderate): Secondary | ICD-10-CM | POA: Diagnosis present

## 2018-01-27 DIAGNOSIS — I129 Hypertensive chronic kidney disease with stage 1 through stage 4 chronic kidney disease, or unspecified chronic kidney disease: Secondary | ICD-10-CM | POA: Diagnosis present

## 2018-01-27 DIAGNOSIS — I272 Pulmonary hypertension, unspecified: Secondary | ICD-10-CM | POA: Diagnosis present

## 2018-01-27 DIAGNOSIS — N1330 Unspecified hydronephrosis: Secondary | ICD-10-CM | POA: Diagnosis not present

## 2018-01-27 DIAGNOSIS — L89893 Pressure ulcer of other site, stage 3: Secondary | ICD-10-CM | POA: Diagnosis present

## 2018-01-27 DIAGNOSIS — I4891 Unspecified atrial fibrillation: Secondary | ICD-10-CM

## 2018-01-27 DIAGNOSIS — E1310 Other specified diabetes mellitus with ketoacidosis without coma: Secondary | ICD-10-CM

## 2018-01-27 DIAGNOSIS — B9689 Other specified bacterial agents as the cause of diseases classified elsewhere: Secondary | ICD-10-CM | POA: Diagnosis present

## 2018-01-27 DIAGNOSIS — N10 Acute tubulo-interstitial nephritis: Secondary | ICD-10-CM | POA: Diagnosis not present

## 2018-01-27 DIAGNOSIS — E785 Hyperlipidemia, unspecified: Secondary | ICD-10-CM | POA: Diagnosis present

## 2018-01-27 DIAGNOSIS — R630 Anorexia: Secondary | ICD-10-CM | POA: Diagnosis present

## 2018-01-27 DIAGNOSIS — E1122 Type 2 diabetes mellitus with diabetic chronic kidney disease: Secondary | ICD-10-CM | POA: Diagnosis present

## 2018-01-27 DIAGNOSIS — I4819 Other persistent atrial fibrillation: Secondary | ICD-10-CM | POA: Diagnosis present

## 2018-01-27 DIAGNOSIS — N136 Pyonephrosis: Secondary | ICD-10-CM | POA: Diagnosis present

## 2018-01-27 DIAGNOSIS — R531 Weakness: Secondary | ICD-10-CM | POA: Diagnosis present

## 2018-01-27 DIAGNOSIS — D638 Anemia in other chronic diseases classified elsewhere: Secondary | ICD-10-CM | POA: Diagnosis present

## 2018-01-27 DIAGNOSIS — Z79891 Long term (current) use of opiate analgesic: Secondary | ICD-10-CM

## 2018-01-27 DIAGNOSIS — Z4659 Encounter for fitting and adjustment of other gastrointestinal appliance and device: Secondary | ICD-10-CM

## 2018-01-27 DIAGNOSIS — D649 Anemia, unspecified: Secondary | ICD-10-CM | POA: Diagnosis not present

## 2018-01-27 DIAGNOSIS — E875 Hyperkalemia: Secondary | ICD-10-CM | POA: Diagnosis not present

## 2018-01-27 DIAGNOSIS — Z833 Family history of diabetes mellitus: Secondary | ICD-10-CM

## 2018-01-27 DIAGNOSIS — G8929 Other chronic pain: Secondary | ICD-10-CM | POA: Diagnosis present

## 2018-01-27 DIAGNOSIS — R6520 Severe sepsis without septic shock: Secondary | ICD-10-CM | POA: Diagnosis present

## 2018-01-27 DIAGNOSIS — L8990 Pressure ulcer of unspecified site, unspecified stage: Secondary | ICD-10-CM

## 2018-01-27 DIAGNOSIS — E11649 Type 2 diabetes mellitus with hypoglycemia without coma: Secondary | ICD-10-CM | POA: Diagnosis present

## 2018-01-27 DIAGNOSIS — E1110 Type 2 diabetes mellitus with ketoacidosis without coma: Secondary | ICD-10-CM

## 2018-01-27 DIAGNOSIS — Z7984 Long term (current) use of oral hypoglycemic drugs: Secondary | ICD-10-CM

## 2018-01-27 DIAGNOSIS — R6521 Severe sepsis with septic shock: Secondary | ICD-10-CM | POA: Diagnosis not present

## 2018-01-27 DIAGNOSIS — Z882 Allergy status to sulfonamides status: Secondary | ICD-10-CM

## 2018-01-27 DIAGNOSIS — Z452 Encounter for adjustment and management of vascular access device: Secondary | ICD-10-CM

## 2018-01-27 DIAGNOSIS — S80812D Abrasion, left lower leg, subsequent encounter: Secondary | ICD-10-CM | POA: Diagnosis not present

## 2018-01-27 DIAGNOSIS — Z794 Long term (current) use of insulin: Secondary | ICD-10-CM

## 2018-01-27 DIAGNOSIS — E872 Acidosis: Secondary | ICD-10-CM | POA: Diagnosis not present

## 2018-01-27 DIAGNOSIS — Z7901 Long term (current) use of anticoagulants: Secondary | ICD-10-CM

## 2018-01-27 DIAGNOSIS — R05 Cough: Secondary | ICD-10-CM

## 2018-01-27 DIAGNOSIS — Z79899 Other long term (current) drug therapy: Secondary | ICD-10-CM

## 2018-01-27 DIAGNOSIS — R059 Cough, unspecified: Secondary | ICD-10-CM

## 2018-01-27 HISTORY — DX: Hyperlipidemia, unspecified: E78.5

## 2018-01-27 LAB — BASIC METABOLIC PANEL
ANION GAP: 21 — AB (ref 5–15)
ANION GAP: 13 (ref 5–15)
BUN: 89 mg/dL — AB (ref 8–23)
BUN: 91 mg/dL — ABNORMAL HIGH (ref 8–23)
BUN: 82 mg/dL — ABNORMAL HIGH (ref 8–23)
BUN: 83 mg/dL — AB (ref 8–23)
CALCIUM: 7.9 mg/dL — AB (ref 8.9–10.3)
CHLORIDE: 110 mmol/L (ref 98–111)
CO2: 8 mmol/L — AB (ref 22–32)
CO2: 17 mmol/L — ABNORMAL LOW (ref 22–32)
Calcium: 8 mg/dL — ABNORMAL LOW (ref 8.9–10.3)
Calcium: 7.7 mg/dL — ABNORMAL LOW (ref 8.9–10.3)
Calcium: 8 mg/dL — ABNORMAL LOW (ref 8.9–10.3)
Chloride: 110 mmol/L (ref 98–111)
Chloride: 117 mmol/L — ABNORMAL HIGH (ref 98–111)
Chloride: 116 mmol/L — ABNORMAL HIGH (ref 98–111)
Creatinine, Ser: 3.19 mg/dL — ABNORMAL HIGH (ref 0.44–1.00)
Creatinine, Ser: 3.01 mg/dL — ABNORMAL HIGH (ref 0.44–1.00)
Creatinine, Ser: 2.85 mg/dL — ABNORMAL HIGH (ref 0.44–1.00)
Creatinine, Ser: 2.46 mg/dL — ABNORMAL HIGH (ref 0.44–1.00)
GFR calc Af Amer: 15 mL/min — ABNORMAL LOW (ref >60)
GFR calc Af Amer: 16 mL/min — ABNORMAL LOW (ref >60)
GFR calc Af Amer: 17 mL/min — ABNORMAL LOW (ref >60)
GFR calc Af Amer: 21 mL/min — ABNORMAL LOW (ref >60)
GFR, EST NON AFRICAN AMERICAN: 13 mL/min — AB (ref >60)
GFR, EST NON AFRICAN AMERICAN: 14 mL/min — AB (ref >60)
GFR, EST NON AFRICAN AMERICAN: 15 mL/min — AB (ref >60)
GFR, EST NON AFRICAN AMERICAN: 18 mL/min — AB (ref >60)
GLUCOSE: 295 mg/dL — AB (ref 70–99)
GLUCOSE: 320 mg/dL — AB (ref 70–99)
GLUCOSE: 136 mg/dL — AB (ref 70–99)
GLUCOSE: 172 mg/dL — AB (ref 70–99)
POTASSIUM: 3.8 mmol/L (ref 3.5–5.1)
POTASSIUM: 3.6 mmol/L (ref 3.5–5.1)
POTASSIUM: 3.8 mmol/L (ref 3.5–5.1)
Potassium: 2.8 mmol/L — ABNORMAL LOW (ref 3.5–5.1)
SODIUM: 146 mmol/L — AB (ref 135–145)
Sodium: 139 mmol/L (ref 135–145)
Sodium: 139 mmol/L (ref 135–145)
Sodium: 144 mmol/L (ref 135–145)

## 2018-01-27 LAB — CBC WITH DIFFERENTIAL/PLATELET
Basophils Absolute: 0 10*3/uL (ref 0–0.1)
Basophils Relative: 0 %
EOS ABS: 0 10*3/uL (ref 0–0.7)
Eosinophils Relative: 0 %
HCT: 37.2 % (ref 35.0–47.0)
HEMOGLOBIN: 11.7 g/dL — AB (ref 12.0–16.0)
LYMPHS ABS: 1.7 10*3/uL (ref 1.0–3.6)
Lymphocytes Relative: 8 %
MCH: 30.6 pg (ref 26.0–34.0)
MCHC: 31.5 g/dL — ABNORMAL LOW (ref 32.0–36.0)
MCV: 97.1 fL (ref 80.0–100.0)
Monocytes Absolute: 1.2 10*3/uL — ABNORMAL HIGH (ref 0.2–0.9)
Monocytes Relative: 5 %
NEUTROS ABS: 19.5 10*3/uL — AB (ref 1.4–6.5)
Neutrophils Relative %: 87 %
Platelets: 491 10*3/uL — ABNORMAL HIGH (ref 150–440)
RBC: 3.83 MIL/uL (ref 3.80–5.20)
RDW: 15.3 % — ABNORMAL HIGH (ref 11.5–14.5)
WBC: 22.3 10*3/uL — AB (ref 3.6–11.0)

## 2018-01-27 LAB — BLOOD GAS, ARTERIAL
ACID-BASE DEFICIT: 15.5 mmol/L — AB (ref 0.0–2.0)
Bicarbonate: 9.1 mmol/L — ABNORMAL LOW (ref 20.0–28.0)
FIO2: 0.21
O2 SAT: 96.7 %
PH ART: 7.29 — AB (ref 7.350–7.450)
Patient temperature: 37
pCO2 arterial: 19 mmHg — CL (ref 32.0–48.0)
pO2, Arterial: 98 mmHg (ref 83.0–108.0)

## 2018-01-27 LAB — URINALYSIS, ROUTINE W REFLEX MICROSCOPIC
Bilirubin Urine: NEGATIVE
GLUCOSE, UA: NEGATIVE mg/dL
Ketones, ur: 5 mg/dL — AB
Nitrite: NEGATIVE
Protein, ur: 30 mg/dL — AB
RBC / HPF: >50 RBC/hpf — ABNORMAL HIGH (ref 0–5)
SPECIFIC GRAVITY, URINE: 1.026 (ref 1.005–1.030)
Squamous Epithelial / LPF: >50 — ABNORMAL HIGH (ref 0–5)
WBC, UA: >50 WBC/hpf — ABNORMAL HIGH (ref 0–5)
pH: 7 (ref 5.0–8.0)

## 2018-01-27 LAB — MRSA PCR SCREENING: MRSA by PCR: NEGATIVE

## 2018-01-27 LAB — GLUCOSE, CAPILLARY
GLUCOSE-CAPILLARY: 279 mg/dL — AB (ref 70–99)
GLUCOSE-CAPILLARY: 132 mg/dL — AB (ref 70–99)
GLUCOSE-CAPILLARY: 157 mg/dL — AB (ref 70–99)
GLUCOSE-CAPILLARY: 213 mg/dL — AB (ref 70–99)
GLUCOSE-CAPILLARY: 175 mg/dL — AB (ref 70–99)
Glucose-Capillary: 257 mg/dL — ABNORMAL HIGH (ref 70–99)
Glucose-Capillary: 261 mg/dL — ABNORMAL HIGH (ref 70–99)
Glucose-Capillary: 170 mg/dL — ABNORMAL HIGH (ref 70–99)
Glucose-Capillary: 143 mg/dL — ABNORMAL HIGH (ref 70–99)
Glucose-Capillary: 176 mg/dL — ABNORMAL HIGH (ref 70–99)
Glucose-Capillary: 144 mg/dL — ABNORMAL HIGH (ref 70–99)

## 2018-01-27 LAB — COMPREHENSIVE METABOLIC PANEL
ALBUMIN: 3.8 g/dL (ref 3.5–5.0)
ALK PHOS: 104 U/L (ref 38–126)
ALT: 23 U/L (ref 0–44)
AST: 21 U/L (ref 15–41)
Anion gap: 25 — ABNORMAL HIGH (ref 5–15)
BILIRUBIN TOTAL: 1 mg/dL (ref 0.3–1.2)
BUN: 96 mg/dL — ABNORMAL HIGH (ref 8–23)
CALCIUM: 8.9 mg/dL (ref 8.9–10.3)
CHLORIDE: 106 mmol/L (ref 98–111)
CO2: 8 mmol/L — AB (ref 22–32)
Creatinine, Ser: 3.4 mg/dL — ABNORMAL HIGH (ref 0.44–1.00)
GFR calc Af Amer: 14 mL/min — ABNORMAL LOW (ref >60)
GFR calc non Af Amer: 12 mL/min — ABNORMAL LOW (ref >60)
Glucose, Bld: 328 mg/dL — ABNORMAL HIGH (ref 70–99)
Potassium: 3.7 mmol/L (ref 3.5–5.1)
SODIUM: 139 mmol/L (ref 135–145)
Total Protein: 7.9 g/dL (ref 6.5–8.1)

## 2018-01-27 LAB — LACTIC ACID, PLASMA
LACTIC ACID, VENOUS: 2.4 mmol/L — AB (ref 0.5–1.9)
Lactic Acid, Venous: 2.9 mmol/L (ref 0.5–1.9)
Lactic Acid, Venous: 1.9 mmol/L (ref 0.5–1.9)
Lactic Acid, Venous: 1.9 mmol/L (ref 0.5–1.9)

## 2018-01-27 LAB — PROTIME-INR
INR: 1.18
PROTHROMBIN TIME: 14.9 s (ref 11.4–15.2)

## 2018-01-27 LAB — LIPASE, BLOOD: Lipase: 148 U/L — ABNORMAL HIGH (ref 11–51)

## 2018-01-27 LAB — MAGNESIUM: Magnesium: 2.1 mg/dL (ref 1.7–2.4)

## 2018-01-27 LAB — HEMOGLOBIN AND HEMATOCRIT, BLOOD
HCT: 32 % — ABNORMAL LOW (ref 35.0–47.0)
HEMOGLOBIN: 10.2 g/dL — AB (ref 12.0–16.0)

## 2018-01-27 LAB — BETA-HYDROXYBUTYRIC ACID: BETA-HYDROXYBUTYRIC ACID: 6.72 mmol/L — AB (ref 0.05–0.27)

## 2018-01-27 LAB — PHOSPHORUS: PHOSPHORUS: 4.7 mg/dL — AB (ref 2.5–4.6)

## 2018-01-27 LAB — CK: Total CK: 49 U/L (ref 38–234)

## 2018-01-27 MED ORDER — SODIUM BICARBONATE 8.4 % IV SOLN
Freq: Once | INTRAVENOUS | Status: AC
  Administered 2018-01-27: 20:00:00 via INTRAVENOUS
  Filled 2018-01-27: qty 100

## 2018-01-27 MED ORDER — METOCLOPRAMIDE HCL 5 MG/ML IJ SOLN
5.0000 mg | Freq: Four times a day (QID) | INTRAMUSCULAR | Status: DC
  Administered 2018-01-28 – 2018-01-29 (×5): 5 mg via INTRAVENOUS
  Filled 2018-01-27 (×5): qty 2

## 2018-01-27 MED ORDER — POTASSIUM CHLORIDE 10 MEQ/100ML IV SOLN
10.0000 meq | INTRAVENOUS | Status: AC
  Administered 2018-01-27 (×2): 10 meq via INTRAVENOUS
  Filled 2018-01-27 (×2): qty 100

## 2018-01-27 MED ORDER — SODIUM CHLORIDE 0.9 % IV BOLUS
1000.0000 mL | Freq: Once | INTRAVENOUS | Status: AC
  Administered 2018-01-27: 1000 mL via INTRAVENOUS

## 2018-01-27 MED ORDER — SODIUM BICARBONATE 8.4 % IV SOLN
150.0000 meq | Freq: Once | INTRAVENOUS | Status: AC
  Administered 2018-01-27: 150 meq via INTRAVENOUS
  Filled 2018-01-27: qty 150

## 2018-01-27 MED ORDER — VANCOMYCIN HCL IN DEXTROSE 1-5 GM/200ML-% IV SOLN
1000.0000 mg | Freq: Once | INTRAVENOUS | Status: AC
  Administered 2018-01-27: 1000 mg via INTRAVENOUS
  Filled 2018-01-27: qty 200

## 2018-01-27 MED ORDER — ACETAMINOPHEN 650 MG RE SUPP
650.0000 mg | Freq: Four times a day (QID) | RECTAL | Status: DC | PRN
  Administered 2018-01-30: 650 mg via RECTAL
  Filled 2018-01-27: qty 1

## 2018-01-27 MED ORDER — PROMETHAZINE HCL 25 MG/ML IJ SOLN
12.5000 mg | Freq: Four times a day (QID) | INTRAMUSCULAR | Status: DC | PRN
  Administered 2018-01-27 – 2018-02-05 (×5): 12.5 mg via INTRAVENOUS
  Filled 2018-01-27 (×5): qty 1

## 2018-01-27 MED ORDER — ONDANSETRON HCL 4 MG/2ML IJ SOLN
INTRAMUSCULAR | Status: AC
  Administered 2018-01-27: 4 mg via INTRAVENOUS
  Filled 2018-01-27: qty 2

## 2018-01-27 MED ORDER — DEXTROSE-NACL 5-0.45 % IV SOLN
INTRAVENOUS | Status: DC
  Administered 2018-01-27: 15:00:00 via INTRAVENOUS

## 2018-01-27 MED ORDER — METOCLOPRAMIDE HCL 5 MG/ML IJ SOLN
10.0000 mg | Freq: Once | INTRAMUSCULAR | Status: AC
  Administered 2018-01-27: 10 mg via INTRAVENOUS

## 2018-01-27 MED ORDER — FAMOTIDINE IN NACL 20-0.9 MG/50ML-% IV SOLN
20.0000 mg | Freq: Two times a day (BID) | INTRAVENOUS | Status: DC
  Administered 2018-01-27 – 2018-02-01 (×10): 20 mg via INTRAVENOUS
  Filled 2018-01-27 (×10): qty 50

## 2018-01-27 MED ORDER — ONDANSETRON HCL 4 MG/2ML IJ SOLN
4.0000 mg | Freq: Once | INTRAMUSCULAR | Status: AC
  Administered 2018-01-27: 4 mg via INTRAVENOUS

## 2018-01-27 MED ORDER — ONDANSETRON HCL 4 MG PO TABS: 4.0000 mg | ORAL_TABLET | Freq: Four times a day (QID) | ORAL | Status: DC | PRN

## 2018-01-27 MED ORDER — ONDANSETRON HCL 4 MG/2ML IJ SOLN: 4.0000 mg | INTRAMUSCULAR | Status: DC | PRN

## 2018-01-27 MED ORDER — SODIUM CHLORIDE 0.9 % IV SOLN
2.0000 g | Freq: Once | INTRAVENOUS | Status: AC
  Administered 2018-01-27: 2 g via INTRAVENOUS
  Filled 2018-01-27: qty 2

## 2018-01-27 MED ORDER — SODIUM BICARBONATE 8.4 % IV SOLN
INTRAVENOUS | Status: DC
  Filled 2018-01-27: qty 100

## 2018-01-27 MED ORDER — SODIUM CHLORIDE 0.9 % IV SOLN
INTRAVENOUS | Status: DC
  Administered 2018-01-27: 2.2 [IU]/h via INTRAVENOUS
  Filled 2018-01-27: qty 1

## 2018-01-27 MED ORDER — SODIUM CHLORIDE 0.9 % IV SOLN: INTRAVENOUS | Status: AC

## 2018-01-27 MED ORDER — SODIUM CHLORIDE 0.9 % IV BOLUS: 250.0000 mL | Freq: Once | INTRAVENOUS | Status: DC

## 2018-01-27 MED ORDER — SODIUM CHLORIDE 0.9 % IV SOLN: INTRAVENOUS | Status: DC

## 2018-01-27 MED ORDER — ACETAMINOPHEN 325 MG PO TABS
650.0000 mg | ORAL_TABLET | Freq: Four times a day (QID) | ORAL | Status: DC | PRN
  Administered 2018-01-30 – 2018-02-06 (×8): 650 mg via ORAL
  Filled 2018-01-27 (×10): qty 2

## 2018-01-27 MED ORDER — HEPARIN SODIUM (PORCINE) 5000 UNIT/ML IJ SOLN
5000.0000 [IU] | Freq: Three times a day (TID) | INTRAMUSCULAR | Status: DC
  Administered 2018-01-27 – 2018-01-29 (×6): 5000 [IU] via SUBCUTANEOUS
  Filled 2018-01-27 (×6): qty 1

## 2018-01-27 MED ORDER — SODIUM CHLORIDE 0.9 % IV SOLN
1.0000 g | INTRAVENOUS | Status: DC
  Administered 2018-01-28 – 2018-01-29 (×2): 1 g via INTRAVENOUS
  Filled 2018-01-27 (×4): qty 1

## 2018-01-27 MED ORDER — FAMOTIDINE IN NACL 20-0.9 MG/50ML-% IV SOLN: 20.0000 mg | Freq: Two times a day (BID) | INTRAVENOUS | Status: DC

## 2018-01-27 MED ORDER — SODIUM BICARBONATE 8.4 % IV SOLN
50.0000 meq | Freq: Once | INTRAVENOUS | Status: AC
  Administered 2018-01-27: 50 meq via INTRAVENOUS
  Filled 2018-01-27: qty 50

## 2018-01-27 MED ORDER — ONDANSETRON HCL 4 MG/2ML IJ SOLN
4.0000 mg | Freq: Four times a day (QID) | INTRAMUSCULAR | Status: DC | PRN
  Administered 2018-01-27: 4 mg via INTRAVENOUS
  Filled 2018-01-27: qty 2

## 2018-01-27 MED ORDER — SODIUM CHLORIDE 0.9 % IV BOLUS: 750.0000 mL | Freq: Once | INTRAVENOUS | Status: DC

## 2018-01-27 NOTE — Progress Notes (Signed)
Tukov, NP gave order to infuse bicarb drip at 150 ml/H.

## 2018-01-27 NOTE — Consult Note (Addendum)
[facility] [place] ICU Medicine Consultation      Name: Tiffany Manning MRN: [id number] DOB: 1941-02-08    ADMISSION DATE:  01/27/2018 CONSULTATION DATE:  01/27/18  REFERRING MD :     CHIEF COMPLAINT:  Severe weakness recently diagnosed UTI and ws being treated as outpt      HISTORY OF PRESENT ILLNESS    Tiffany Manning  is a 77 y.o. female with a known history of diabetes, hypertension, hyperlipidemia who presents to the ER due to generalized weakness fatigue and just not feeling well over the past week.  Patient has had significant malaise and just generalized weakness over the past week progressively getting worse went to see her primary care physician and was placed on oral antibiotic( nitrofurantoin as per pt) for suspected UTI. She continued to feel poorly and was not getting better and felt worse today and decided to come to ER. She has been nauseated and has been throwing up and unable to keep anything down. In the ER she was noted to be hypotensive, tachycardic, noted to have a leukocytosis suspected to have severe sepsis secondary to UTI and hospitalist services were contacted for admission. .  In the emergency room patient was noted to be in acute kidney injury with severe hyperglycemia, and noted to be acidotic.  She was assesed to have DKA, severe sepsis, ARF and UTI and transferred to ICU. In the ICU she cont to feel poorly with severe weakness with ongoing nausea, has not vomited so for in the icu.she denies scough sob or CP. She denies fever but reports chills over the last week. An in/out foley in ER had revealed purulent urine      SIGNIFICANT EVENTS   AS above    PAST MEDICAL HISTORY    :  Past Medical History:  Diagnosis Date  . Diabetes mellitus without complication (HCC)   . Hyperlipidemia   . Hypertension   Chronic left lateral leg ulcer Past Surgical History:  Procedure Laterality Date  . ABDOMINAL HYSTERECTOMY     Prior to Admission medications     Medication Sig Start Date End Date Taking? Authorizing Provider  atenolol (TENORMIN) 50 MG tablet Take 50 mg by mouth 2 (two) times daily. 05/04/17  Yes [provider]  glipiZIDE-metformin (METAGLIP) 5-500 MG tablet Take 1 tablet by mouth 2 (two) times daily. 04/03/17  Yes [provider]  nitrofurantoin, macrocrystal-monohydrate, (MACROBID) 100 MG capsule Take 100 mg by mouth 2 (two) times daily. For 7 days 01/19/18  Yes [provider]  simvastatin (ZOCOR) 40 MG tablet Take 40 mg by mouth at bedtime.    Yes [provider]  triamterene-hydrochlorothiazide (MAXZIDE-25) 37.5-25 MG tablet Take 1 tablet by mouth 2 (two) times daily.   Yes [provider]  Multiple Vitamin (MULTIVITAMIN WITH MINERALS) TABS tablet Take 1 tablet by mouth daily. Patient not taking: Reported on 01/27/2018 06/07/17   Ramonita Lab, MD   Allergies  Allergen Reactions  . Sulfa Antibiotics Nausea And Vomiting     FAMILY HISTORY   Family History  Problem Relation Age of Onset  . Diabetes Sister       SOCIAL HISTORY    reports that she has never smoked. She has never used smokeless tobacco. She reports that she does not drink alcohol or use drugs.  ROS  No headaches, sezures. No rashes. No cp. No sob. No rashes.no diarrhea.    VITAL SIGNS    Temp:  [96.5 F (35.8 C)] 96.5 F (35.8 C) (  09/22 0839) Pulse Rate:  [125-130] 125 (09/22 1122) Resp:  [21-28] 22 (09/22 1122) BP: (90-125)/(43-91) 103/63 (09/22 1122) SpO2:  [93 %] 93 % (09/22 1122) Weight:  [54.4 kg-57.4 kg] 57.4 kg (09/22 1122) HEMODYNAMICS:   VENTILATOR SETTINGS:   INTAKE / OUTPUT: No intake or output data in the 24 hours ending 01/27/18 1211     PHYSICAL EXAM   Physical Exam Distressed, looks uncomfortable, moaning  No edema,cyanosis,clubbing or pallor.no jaundice Mouth dry, pupils equal and reactive cvs s1 and s2, tachycardic without murmers Chest CTA without wheezing or sig  crackles abd soft non distended .+ tenderness left flank/renal are.+ BS. No organomegaly Cns awake and alert x 3 and appropiate non focal, moves all extremeties LLE  4x2 inches superficial ulcer in the mid leg lateral side with exudate    LABS   LABS:  CBC Recent Labs  Lab 01/27/18 0845  WBC 22.3*  HGB 11.7*  HCT 37.2  PLT 491*   Coag's Recent Labs  Lab 01/27/18 0845  INR 1.18   BMET Recent Labs  Lab 01/27/18 0845 01/27/18 1004  NA 139 139  K 3.7 3.8  CL 106 110  CO2 8* 8*  BUN 96* 89*  CREATININE 3.40* 3.19*  GLUCOSE 328* 295*   Electrolytes Recent Labs  Lab 01/27/18 0845 01/27/18 1004  CALCIUM 8.9 8.0*   Sepsis Markers Recent Labs  Lab 01/27/18 0845  LATICACIDVEN 2.9*   ABG Recent Labs  Lab 01/27/18 1027  PHART 7.03*  PCO2ART 19*  PO2ART 154*   Liver Enzymes Recent Labs  Lab 01/27/18 0845  AST 21  ALT 23  ALKPHOS 104  BILITOT 1.0  ALBUMIN 3.8   Cardiac Enzymes No results for input(s): TROPONINI, PROBNP in the last 168 hours. Glucose Recent Labs  Lab 01/27/18 0948 01/27/18 1118  GLUCAP 257* 279*     No results found for this or any previous visit (from the past 240 hour(s)).   Current Facility-Administered Medications:  .  0.9 %  sodium chloride infusion, , Intravenous, Continuous, Sainani, Vivek J, MD .  0.9 %  sodium chloride infusion, , Intravenous, Continuous, Sainani, Rolly Pancake, MD .  acetaminophen (TYLENOL) tablet 650 mg, 650 mg, Oral, Q6H PRN **OR** acetaminophen (TYLENOL) suppository 650 mg, 650 mg, Rectal, Q6H PRN, Houston Siren, MD .  Melene Muller ON 01/28/2018] ceFEPIme (MAXIPIME) 1 g in sodium chloride 0.9 % 100 mL IVPB, 1 g, Intravenous, Q24H, Hallaji, Sheema M, RPH .  dextrose 5 %-0.45 % sodium chloride infusion, , Intravenous, Continuous, Sainani, Rolly Pancake, MD, Stopped at 01/27/18 1155 .  heparin injection 5,000 Units, 5,000 Units, Subcutaneous, Q8H, Sainani, Vivek J, MD .  insulin regular (NOVOLIN R,HUMULIN R) 100  Units in sodium chloride 0.9 % 100 mL (1 Units/mL) infusion, , Intravenous, Continuous, Sainani, Vivek J, MD, Last Rate: 2.2 mL/hr at 01/27/18 1153, 2.2 Units/hr at 01/27/18 1153 .  ondansetron (ZOFRAN) tablet 4 mg, 4 mg, Oral, Q6H PRN **OR** ondansetron (ZOFRAN) injection 4 mg, 4 mg, Intravenous, Q6H PRN, Houston Siren, MD, 4 mg at 01/27/18 1135 .  potassium chloride 10 mEq in 100 mL IVPB, 10 mEq, Intravenous, Q1H, Houston Siren, MD, Last Rate: 100 mL/hr at 01/27/18 1202, 10 mEq at 01/27/18 1202 .  sodium chloride 0.9 % bolus 1,000 mL, 1,000 mL, Intravenous, Once, Arbie Cookey, MD .  sodium chloride 0.9 % bolus 750 mL, 750 mL, Intravenous, Once, Sharyn Creamer, MD  IMAGING  9/22  Portable abd as per rad IMPRESSION:  1. No acute findings.  No evidence bowel obstruction or free air.  cxr as per rad  IMPRESSION: 1. No acute cardiopulmonary process. 2. Left upper lobe pulmonary nodule, which was enlarging on neck CT performed in January. This remains concerning for malignancy, and further evaluation recommended if not performed elsewhere.     MICRO DATA: MRSA PCR  Urine  Blood Resp   ANTIMICROBIALS:  Vancomycine and Cefepime    ASSESSMENT/PLAN    1, Severe sepsis with lactic acidosis related to UTI/urosepsis Blood and urine c/s. broad spectrum abx, cefepime and vancomycin Place foley. Renal u/s report is pending. R/o hydro/pyonephrosis LA may also be related to metformin.  2.Acute renal failure, ATN vs obstruction, renal u/s report is pending  IVF, follow renal u/s  3. DKA , IVF, insulin drip, DKA protocol.oral diabetic meds on hold  4. Elevated Lipase ? Pancreatitis, NPO, may need CT abdomen  5.CARDIOVASCULAR Monitoring for now, stable BP with tachycardia    6.GASTROINTESTINAL N/V related to DKA, NPO ,prn zofran   7.NEUROLOGIC, no defecits  8. Hold hyperlipidemic and BP meds  9. Enlarging LUL nodule, likely lung cancer will need to be addressed as  outpt PET CT vs CT FNA vs resection    DVT,GI prophylaxis ordered. Full code    I have personally obtained a history, examined the patient, evaluated laboratory and independently reviewed  imaging results, formulated the assessment and plan and placed orders.  The Patient requires high complexity decision making for assessment and support, frequent evaluation and titration of therapies, application of advanced monitoring technologies and extensive interpretation of multiple databases. Critical Care Time devoted to patient care services described in this note is 70min minutes.   Overall, patient is critically ill, prognosis is guarded. Patient at high risk for cardiac arrest and death.    Arbie CookeyKhalid Alekxander Isola, MD  01/27/2018 12:11 PM [facility]     Addendum   Renal u/s shows  Right Kidney:  Length: 10.5 cm. Echogenic renal parenchyma. 16 x 14 x 10 mm lower pole cyst. No hydronephrosis.  Left Kidney:  Length: 8.6 cm. Cortical scarring with lobular parenchyma. Echogenic renal parenchyma. Stable hydronephrosis versus extrarenal pelvis.  Bladder:  Within normal limits.  IMPRESSION: Echogenic renal parenchyma, suggesting medical renal disease.  Left renal cortical scarring with stable hydronephrosis versus extrarenal pelvis, chronic. This appearance is unchanged from 2013.  1.6 cm right lower pole renal cyst, simple.   She has left abd pain with elevated lipase, will get CT abd .  She continues to have N/V which prompted the placement of NG tube which revealed small amount of coffee ground material.This is likely related to Gastritis Wew ill follow her H&H. Will consult GI If there is sig drop in H&H .

## 2018-01-27 NOTE — Progress Notes (Signed)
Pt has remained alert and oriented with c/o mid, right abd pain. Pt has had severe N/V with dark brown, coffee ground emesis. NG placed->removed by pt->and replaced per Dr Allena NapoleonBashir orders. H & H ordered q 6 to r/o GI bld. Pt is in need of abdCT but it has been un-obtainable d/t her N/V. Pt has remained in A Fib on cardiac monitor, rate controlled in 90s with fluctuation to 120s. BP WNL. Pt has remained on RA, SpO2 > 90%, NDN. Pt remains on insulin gtt & D51/2NS with no anion gap able to be calculated per BMP-CO2 <7. Family has been updated at bedside.

## 2018-01-27 NOTE — H&P (Signed)
[facility] - [place] [facility]   PATIENT NAME: Tiffany LangeBarbara Cooks    MR#:  [id number]  DATE OF BIRTH:  01/26/1941  DATE OF ADMISSION:  01/27/2018  PRIMARY CARE PHYSICIAN: Jaclyn Shaggyate, Denny C, MD   REQUESTING/REFERRING PHYSICIAN: Dr. Sharyn CreamerMark Quale  CHIEF COMPLAINT:   Chief Complaint  Patient presents with  . Weakness  . GI Bleeding  . Fall    HISTORY OF PRESENT ILLNESS:  Tiffany Manning  is a 77 y.o. female with a known history of diabetes, hypertension, hyperlipidemia who presents to the hospital due to generalized weakness fatigue and just not feeling well over the past week.  Patient has had significant malaise and just generalized weakness over the past week progressively getting worse went to see her primary care physician and was placed on oral antibiotic for suspected UTI.  Patient's husband cannot recall which antibiotic it was.  Patient has not been improving and therefore came to the ER for further evaluation.  Patient was noted to be hypotensive, tachycardic, noted to have a leukocytosis suspected to have severe sepsis secondary to UTI and hospitalist services were contacted for admission.  Patient denies any chest pains, shortness of breath but admits to ongoing nausea vomiting and poor p.o. intake over the past week.  In the emergency room patient was noted to be in acute kidney injury with severe hyperglycemia, and noted to be acidotic.  Hospitalist services were therefore contacted for admission.  PAST MEDICAL HISTORY:   Past Medical History:  Diagnosis Date  . Diabetes mellitus without complication (HCC)   . Hyperlipidemia   . Hypertension     PAST SURGICAL HISTORY:   Past Surgical History:  Procedure Laterality Date  . ABDOMINAL HYSTERECTOMY      SOCIAL HISTORY:   Social History   Tobacco Use  . Smoking status: Never Smoker  . Smokeless tobacco: Never Used  Substance Use Topics  . Alcohol use: No    Frequency: Never    FAMILY HISTORY:    Family History  Problem Relation Age of Onset  . Diabetes Sister     DRUG ALLERGIES:   Allergies  Allergen Reactions  . Sulfa Antibiotics Nausea And Vomiting    REVIEW OF SYSTEMS:   Review of Systems  Constitutional: Positive for malaise/fatigue. Negative for fever and weight loss.  HENT: Negative for congestion, nosebleeds and tinnitus.   Eyes: Negative for blurred vision, double vision and redness.  Respiratory: Negative for cough, hemoptysis and shortness of breath.   Cardiovascular: Negative for chest pain, orthopnea, leg swelling and PND.  Gastrointestinal: Positive for nausea and vomiting. Negative for abdominal pain, diarrhea and melena.  Genitourinary: Positive for dysuria. Negative for hematuria and urgency.  Musculoskeletal: Negative for falls and joint pain.  Neurological: Negative for dizziness, tingling, sensory change, focal weakness, seizures, weakness and headaches.  Endo/Heme/Allergies: Negative for polydipsia. Does not bruise/bleed easily.  Psychiatric/Behavioral: Negative for depression and memory loss. The patient is not nervous/anxious.     MEDICATIONS AT HOME:   Prior to Admission medications   Medication Sig Start Date End Date Taking? Authorizing Provider  acetaminophen (TYLENOL) 650 MG CR tablet Take 650 mg by mouth every 8 (eight) hours as needed for pain.    [provider]  amoxicillin-clavulanate (AUGMENTIN) 875-125 MG tablet Take 1 tablet by mouth every 12 (twelve) hours. 06/06/17   Gouru, Deanna ArtisAruna, MD  atenolol (TENORMIN) 50 MG tablet Take 50 mg by mouth 2 (two) times daily. 05/04/17   [provider]  glipiZIDE-metformin (METAGLIP) 5-500 MG tablet Take 1 tablet by mouth 2 (two) times daily. 04/03/17   [provider]  Multiple Vitamin (MULTIVITAMIN WITH MINERALS) TABS tablet Take 1 tablet by mouth daily. 06/07/17   Gouru, Deanna Artis, MD  simvastatin (ZOCOR) 40 MG tablet Take 40 mg by mouth daily. 05/04/17   [provider]  traMADol (ULTRAM) 50 MG tablet Take 1 tablet (50 mg total) by mouth every 6 (six) hours as needed. 06/06/17   Ramonita Lab, MD      VITAL SIGNS:  Blood pressure 125/72, pulse (!) 130, temperature (!) 96.5 F (35.8 C), temperature source Axillary, resp. rate (!) 21, height 5\' 4"  (1.626 m), weight 54.4 kg.  PHYSICAL EXAMINATION:  Physical Exam  GENERAL:  77 y.o.-year-old patient lying in the bed uncomfortable and complaining of back pain.   EYES: Pupils equal, round, reactive to light and accommodation. No scleral icterus. Extraocular muscles intact.  HEENT: Head atraumatic, normocephalic. Oropharynx and nasopharynx clear. No oropharyngeal erythema, dyr oral mucosa.  NECK:  Supple, no jugular venous distention. No thyroid enlargement, no tenderness.  LUNGS: Normal breath sounds bilaterally, no wheezing, rales, rhonchi. No use of accessory muscles of respiration.  CARDIOVASCULAR: S1, S2 RRR. No murmurs, rubs, gallops, clicks.  ABDOMEN: Soft, nontender, nondistended. Bowel sounds present. No organomegaly or mass.  EXTREMITIES: No pedal edema, cyanosis, or clubbing. + 2 pedal & radial pulses b/l.   NEUROLOGIC: Cranial nerves II through XII are intact. No focal Motor or sensory deficits appreciated b/l. Globally weak.  PSYCHIATRIC: The patient is alert and oriented x 3. SKIN: No obvious rash, lesion, left leg calf ulcer.  LABORATORY PANEL:   CBC Recent Labs  Lab 01/27/18 0845  WBC 22.3*  HGB 11.7*  HCT 37.2  PLT 491*   ------------------------------------------------------------------------------------------------------------------  Chemistries  Recent Labs  Lab 01/27/18 0845  NA 139  K 3.7  CL 106  CO2 8*  GLUCOSE 328*  BUN 96*  CREATININE 3.40*  CALCIUM 8.9  AST 21  ALT 23  ALKPHOS 104  BILITOT 1.0   ------------------------------------------------------------------------------------------------------------------  Cardiac Enzymes No results for  input(s): TROPONINI in the last 168 hours. ------------------------------------------------------------------------------------------------------------------  RADIOLOGY:  Dg Chest Port 1 View  Result Date: 01/27/2018 CLINICAL DATA:  Fall this morning. Patient reports being diagnosed with a kidney infection last week. Vomiting. History of diabetes and hypertension. EXAM: PORTABLE CHEST 1 VIEW COMPARISON:  05/17/2006 radiographs.  CT neck 05/28/2017 FINDINGS: 0845 hours. The heart size is at the upper limits of normal for AP technique. There is aortic atherosclerosis. There is a left upper lobe lung nodule measuring approximately 14 mm (not corrected for magnification), unchanged from previous neck CT. The lungs are otherwise clear. There is no pleural effusion or pneumothorax. No acute osseous findings are evident. IMPRESSION: 1. No acute cardiopulmonary process. 2. Left upper lobe pulmonary nodule, which was enlarging on neck CT performed in January. This remains concerning for malignancy, and further evaluation recommended if not performed elsewhere. Electronically Signed   By: Carey Bullocks M.D.   On: 01/27/2018 09:17   Dg Abd Portable 2 Views  Result Date: 01/27/2018 CLINICAL DATA:  Fall this morning, patient has been weak for the past couple weeks and states that she was diagnosed with kidney infection a week ago and that pt has been having dark blood like emesis for the past couple days. Possible sepsis. Hx of diabetes, HTN, abdominal hysterectomy. EXAM: PORTABLE ABDOMEN - 2 VIEW COMPARISON:  CT, 09/19/2011 FINDINGS: Normal bowel gas pattern.  No free air. No evidence of renal or ureteral stones. Clips are upper quadrant reflect a prior cholecystectomy. There are scattered vascular calcifications. No acute skeletal abnormality.  Lung bases are clear. IMPRESSION: 1. No acute findings.  No evidence bowel obstruction or free air. Electronically Signed   By: Amie Portland M.D.   On: 01/27/2018 09:14      IMPRESSION AND PLAN:   77 year old female with past medical history of diabetes, hypertension, hyperlipidemia, previous history of a neck abscess who presents to the hospital due to weakness, lethargy and malaise and noted to have severe sepsis secondary to UTI and also noted to be in acute diabetic ketoacidosis.  1.  Acute diabetic ketoacidosis- secondary to underlying sepsis.  Patient apparently has severe sepsis secondary to UTI and has not been eating and drinking and not taking her medications. - We will aggressively hydrate her with IV fluids, follow serial metabolic profiles, placed on insulin drip.  Treat underlying sepsis with broad-spectrum IV antibiotics. -Follow serial metabolic profiles until anion gap closes, replace electrolytes accordingly.  2.  Sepsis-patient has severe sepsis secondary to UTI. -Continue aggressive IV fluid hydration, will treat underlying UTI with IV cefepime, follow cultures. -I will get infectious disease consult.  3.  Urinary tract infection-source of patient's sepsis. -We will treat with IV cefepime, follow cultures.  As per the patient's primary care physician she had a urine culture which grew out E. coli which was pre-much pansensitive other than intermediately sensitive to ampicillin and cefuroxime.  4.  Acute kidney injury secondary to severe sepsis and underlying UTI combined with concomitant use of diuretics. -We will aggressively hydrate the patient with IV fluids, follow BUN and creatinine urine output.  Renal dose meds, avoid nephrotoxins. -We will get a renal ultrasound.  5.  Leukocytosis- secondary to sepsis/UTI. - Follow with IV antibiotic therapy.  6.  Hyperlipidemia-resume simvastatin when patients can take p.o.  6.  Essential hypertension-hold patient's antihypertensives given patient's DKA and relative hypotension.     All the records are reviewed and case discussed with ED provider. Management plans discussed with the  patient, family and they are in agreement.  CODE STATUS: Full code  TOTAL Critical Care TIME TAKING CARE OF THIS PATIENT: 45 minutes.    Houston Siren M.D on 01/27/2018 at 10:01 AM  Between 7am to 6pm - Pager - [phone]  After 6pm go to [email or web address] - password [email or web address] [facility]  [place]  Office  [phone]  CC: Primary care physician; Jaclyn Shaggy, MD

## 2018-01-27 NOTE — ED Triage Notes (Addendum)
Pt's husband called 911 this am for patient who had fallen, pt has been weak for the past couple weeks and reports that she was diagnosed with kidney infection a week ago and that pt has been having dark blood like emesis for the past couple days. Pt appears weak, but is alert and stating that she "just doesn't feel good" ivs started by ems 18g left ac and 20g rt ac, with 150 ml of NS

## 2018-01-27 NOTE — Progress Notes (Signed)
CODE SEPSIS - PHARMACY COMMUNICATION  **Broad Spectrum Antibiotics should be administered within 1 hour of Sepsis diagnosis**  Time Code Sepsis Called/Page Received: @ [id number]  Antibiotics Ordered: Cefepime 2g                                      Vancomycin 1g  Time of 1st antibiotic administration: @ [phone]  Additional action taken by pharmacy: Contacted nurse @ [phone] about starting Abx.   If necessary, Name of Provider/Nurse Contacted: Hunter, RN   Gardner CandleSheema M Maleah Rabago, PharmD, BCPS Clinical Pharmacist 01/27/2018 9:29 AM

## 2018-01-27 NOTE — ED Provider Notes (Signed)
[facility] Emergency Department Provider Note   ____________________________________________   First MD Initiated Contact with Patient 01/27/18 [phone]     (approximate)  I have reviewed the triage vital signs and the nursing notes.   HISTORY  Chief Complaint Weakness and a near fall  EM caveat: Patient has been provides majority of history as the patient feels very fatigued weak, unable to provide full history  HPI Tiffany Manning is a 77 y.o. female husband reports that for about 1 week's time now she has not hardly been able to eat or drink anything.  She started vomiting about 2 days ago she is continued to feel poorly, husband reports that seem somewhat dark.  Not obviously blood though.  She is been very fatigued even try to get her to come the hospital for a week, but she has not wanted to  This morning when he was helping her he stood her up and she was very fatigued and weak, she did not pass out and her husband held her but had to lower her to the floor because of her for severe fatigue and she almost fell  She did not fall or strike her head.  No cough or shortness of breath.  Patient reports she is not in pain but just does not feel well, she feels "sick" but cannot really describe what the symptoms are.  Recent antibiotic use, husband reports they have had to switch between a couple of antibiotics for the last week or so for urinary tract infection and has not been getting better seen and treated by Dr. Arlana Pouch  Past Medical History:  Diagnosis Date  . Diabetes mellitus without complication (HCC)   . Hyperlipidemia   . Hypertension     Patient Active Problem List   Diagnosis Date Noted  . Severe sepsis (HCC) 01/27/2018  . Protein-calorie malnutrition, severe 05/29/2017  . Acute renal failure (HCC) 05/28/2017  . Acute renal failure (ARF) (HCC) 05/28/2017    Past Surgical History:  Procedure Laterality Date  . ABDOMINAL HYSTERECTOMY       Prior to Admission medications   Medication Sig Start Date End Date Taking? Authorizing Provider  atenolol (TENORMIN) 50 MG tablet Take 50 mg by mouth 2 (two) times daily. 05/04/17  Yes [provider]  glipiZIDE-metformin (METAGLIP) 5-500 MG tablet Take 1 tablet by mouth 2 (two) times daily. 04/03/17  Yes [provider]  nitrofurantoin, macrocrystal-monohydrate, (MACROBID) 100 MG capsule Take 100 mg by mouth 2 (two) times daily. For 7 days 01/19/18  Yes [provider]  simvastatin (ZOCOR) 40 MG tablet Take 40 mg by mouth at bedtime.    Yes [provider]  triamterene-hydrochlorothiazide (MAXZIDE-25) 37.5-25 MG tablet Take 1 tablet by mouth 2 (two) times daily.   Yes [provider]  Multiple Vitamin (MULTIVITAMIN WITH MINERALS) TABS tablet Take 1 tablet by mouth daily. Patient not taking: Reported on 01/27/2018 06/07/17   Ramonita Lab, MD    Allergies Sulfa antibiotics  Family History  Problem Relation Age of Onset  . Diabetes Sister     Social History Social History   Tobacco Use  . Smoking status: Never Smoker  . Smokeless tobacco: Never Used  Substance Use Topics  . Alcohol use: No    Frequency: Never  . Drug use: No    Review of Systems Constitutional: Fatigue weakness Eyes: No visual changes. ENT: No sore throat.  Her dry Cardiovascular: Denies chest pain. Respiratory: Denies shortness of breath. Gastrointestinal: No  abdominal pain.  Occasional vomiting. Genitourinary: Not urinating much, suspect a urinary tract infection for which she is been treated on 2 antibiotics recently Musculoskeletal: Negative for back pain. Skin: Negative for rash. Neurological: Negative for headaches, focal weakness or numbness.  Just reports she feels very weak all over.    ____________________________________________   PHYSICAL EXAM:  VITAL SIGNS: ED Triage Vitals  Enc Vitals Group     BP 01/27/18 0839 (!) 90/43     Pulse Rate  01/27/18 0839 (!) 130     Resp 01/27/18 0839 (!) 28     Temp 01/27/18 0839 (!) 96.5 F (35.8 C)     Temp Source 01/27/18 0839 Axillary     SpO2 --      Weight 01/27/18 0840 120 lb (54.4 kg)     Height 01/27/18 0840 5\' 4"  (1.626 m)     Head Circumference --      Peak Flow --      Pain Score 01/27/18 0840 0     Pain Loc --      Pain Edu? --      Excl. in GC? --     Constitutional: Alert and oriented but very fatigued and somewhat somnolent.  Ill-appearing, appears very fatigued and generally ill.  Appears critical but no acute respiratory or painful extremities Eyes: Conjunctivae are normal. Head: Atraumatic. Nose: No congestion/rhinnorhea. Mouth/Throat: Mucous membranes are very dry Neck: No stridor.   Cardiovascular: Tachycardic rate, irregular rhythm. Grossly normal heart sounds.  Good peripheral circulation. Respiratory: Normal respiratory effort slightly elevated respiratory rate.  No retractions. Lungs CTAB. Gastrointestinal: Soft and nontender. No distention. Musculoskeletal: No lower extremity tenderness nor edema. Neurologic:  Normal speech and language somewhat soft. No gross focal neurologic deficits are appreciated.  Skin:  Skin is cool peripherally with capillary refill about 3 seconds on presentation, dry and intact. No rash noted. Psychiatric: Mood and affect are calm, somewhat sedate. ____________________________________________   LABS (all labs ordered are listed, but only abnormal results are displayed)  Labs Reviewed  LACTIC ACID, PLASMA - Abnormal; Notable for the following components:      Result Value   Lactic Acid, Venous 2.9 (*)    All other components within normal limits  COMPREHENSIVE METABOLIC PANEL - Abnormal; Notable for the following components:   CO2 8 (*)    Glucose, Bld 328 (*)    BUN 96 (*)    Creatinine, Ser 3.40 (*)    GFR calc non Af Amer 12 (*)    GFR calc Af Amer 14 (*)    Anion gap 25 (*)    All other components within normal  limits  LIPASE, BLOOD - Abnormal; Notable for the following components:   Lipase 148 (*)    All other components within normal limits  CBC WITH DIFFERENTIAL/PLATELET - Abnormal; Notable for the following components:   WBC 22.3 (*)    Hemoglobin 11.7 (*)    MCHC 31.5 (*)    RDW 15.3 (*)    Platelets 491 (*)    Neutro Abs 19.5 (*)    Monocytes Absolute 1.2 (*)    All other components within normal limits  URINALYSIS, ROUTINE W REFLEX MICROSCOPIC - Abnormal; Notable for the following components:   Color, Urine YELLOW (*)    APPearance TURBID (*)    Hgb urine dipstick SMALL (*)    Ketones, ur 5 (*)    Protein, ur 30 (*)    Leukocytes, UA SMALL (*)    RBC /  HPF >50 (*)    WBC, UA >50 (*)    Bacteria, UA MANY (*)    Squamous Epithelial / LPF >50 (*)    All other components within normal limits  BLOOD GAS, VENOUS - Abnormal; Notable for the following components:   pH, Ven 6.99 (*)    pCO2, Ven 20 (*)    All other components within normal limits  GLUCOSE, CAPILLARY - Abnormal; Notable for the following components:   Glucose-Capillary 257 (*)    All other components within normal limits  CULTURE, BLOOD (ROUTINE X 2)  CULTURE, BLOOD (ROUTINE X 2)  PROTIME-INR  LACTIC ACID, PLASMA  BASIC METABOLIC PANEL  CK  BLOOD GAS, ARTERIAL  CBG MONITORING, ED   ____________________________________________  EKG  Reviewed and interpreted me at 9:30 AM Heart rate 120 QRS 110 QTc 470 A. fib, rapid ventricular response.  The some minor ST depression noted primarily in the lateral regions.  Consider ischemia.  No ST elevation ____________________________________________  RADIOLOGY  Dg Chest Port 1 View  Result Date: 01/27/2018 CLINICAL DATA:  Fall this morning. Patient reports being diagnosed with a kidney infection last week. Vomiting. History of diabetes and hypertension. EXAM: PORTABLE CHEST 1 VIEW COMPARISON:  05/17/2006 radiographs.  CT neck 05/28/2017 FINDINGS: 0845 hours. The heart  size is at the upper limits of normal for AP technique. There is aortic atherosclerosis. There is a left upper lobe lung nodule measuring approximately 14 mm (not corrected for magnification), unchanged from previous neck CT. The lungs are otherwise clear. There is no pleural effusion or pneumothorax. No acute osseous findings are evident. IMPRESSION: 1. No acute cardiopulmonary process. 2. Left upper lobe pulmonary nodule, which was enlarging on neck CT performed in January. This remains concerning for malignancy, and further evaluation recommended if not performed elsewhere. Electronically Signed   By: Carey BullocksWilliam  Veazey M.D.   On: 01/27/2018 09:17   Dg Abd Portable 2 Views  Result Date: 01/27/2018 CLINICAL DATA:  Fall this morning, patient has been weak for the past couple weeks and states that she was diagnosed with kidney infection a week ago and that pt has been having dark blood like emesis for the past couple days. Possible sepsis. Hx of diabetes, HTN, abdominal hysterectomy. EXAM: PORTABLE ABDOMEN - 2 VIEW COMPARISON:  CT, 09/19/2011 FINDINGS: Normal bowel gas pattern.  No free air. No evidence of renal or ureteral stones. Clips are upper quadrant reflect a prior cholecystectomy. There are scattered vascular calcifications. No acute skeletal abnormality.  Lung bases are clear. IMPRESSION: 1. No acute findings.  No evidence bowel obstruction or free air. Electronically Signed   By: Amie Portlandavid  Ormond M.D.   On: 01/27/2018 09:14    ____________________________________________   PROCEDURES  Procedure(s) performed: None  Procedures  Critical Care performed: Yes, see critical care note(s)  CRITICAL CARE Performed by: Sharyn CreamerMark Quale   Total critical care time: 65 minutes  Critical care time was exclusive of separately billable procedures and treating other patients.  Critical care was necessary to treat or prevent imminent or life-threatening deterioration.  Critical care was time spent personally  by me on the following activities: development of treatment plan with patient and/or surrogate as well as nursing, discussions with consultants, evaluation of patient's response to treatment, examination of patient, obtaining history from patient or surrogate, ordering and performing treatments and interventions, ordering and review of laboratory studies, ordering and review of radiographic studies, pulse oximetry and re-evaluation of patient's condition.  ____________________________________________   INITIAL IMPRESSION /  ASSESSMENT AND PLAN / ED COURSE  Pertinent labs & imaging results that were available during my care of the patient were reviewed by me and considered in my medical decision making (see chart for details).  Patient presents for evaluation for severe severe fatigue, nearly fell this morning.  High concern for severe sepsis and septic shock, patient presents with a recent history of urinary tract infection which in discussion with the family has been treated recent with antibiotic without improvement, worsening over a week's time now with vomiting and severe fatigue.  She presents hypotensive with a mean arterial pressure less than 65, elevated lactate, tachycardic with atrial fibrillation which does not appear to be a previous diagnosis.  She denies chest pain or respiratory symptoms.  Neurologically no focal abnormalities but very fatigued somewhat somnolent.  Very high concern for sepsis given her presentation today also with associated hypothermia.  Patient meets criteria for septic shock.  Broad-spectrum antibiotic coverage ordered as well as weight-based fluid resuscitation.    Sepsis - Repeat Assessment  Performed at:    9:20 AM  Vitals     Blood pressure (!) 90/43, pulse (!) 130, temperature (!) 96.5 F (35.8 C), temperature source Axillary, resp. rate (!) 28, height 5\' 4"  (1.626 m), weight 54.4 kg.  Heart:     Irregular rate and rhythm  Lungs:    CTA  Capillary  Refill:   <2 sec  Peripheral Pulse:   Radial pulse palpable  Skin:     Pale   ----------------------------------------- 9:31 AM on 01/27/2018 -----------------------------------------  Patient elevate a white count, continue to await chemistry.  Patient's hemodynamics are improving including her blood pressure is now normalized, remains tachycardic with A. fib.  Patient does take atenolol, and I am not aggressively treating her A. fib with rate controlling medication at this time as I am highly concerned that her tachycardia is in response to metabolic demand and septic shock.  Patient is showing improvement in her hemodynamics and mental status, becoming more alert and appearing improved with fluid resuscitation and antibiotics.  I have called hospitalist service to request admission for this critically ill patient at this time.  ----------------------------------------- 9:35 AM on 01/27/2018 -----------------------------------------  Labs demonstrate acute kidney injury, and evidence of endorgan failure.  Notable anion gap acidosis, await VBG at this time continuing fluid resuscitation. Can not exclude    Discussed with Dr. Arlana Pouch, he is not able to easily access his current culture data but will go to his office and call us back within about 20 to 30 minutes with results from her last urine culture and antibiotic therapies.  ----------------------------------------- 9:50 AM on 01/27/2018 -----------------------------------------  Dr. Cherlynn Kaiser, admitting physician currently at the bedside.  Venous blood gas is now resulted with severe acidosis as well.  Fluid resuscitation ongoing, consider patient may have severe metabolic acidosis secondary to potentially DKA as well, sepsis, lactic acidosis, all strongly considered. ____________________________________________   FINAL CLINICAL IMPRESSION(S) / ED DIAGNOSES  Final diagnoses:  AKI (acute kidney injury) (HCC)  Severe sepsis with  septic shock (CODE) (HCC)  Atrial fibrillation with rapid ventricular response (HCC)  Diabetic ketoacidosis without coma associated with other specified diabetes mellitus (HCC)      NEW MEDICATIONS STARTED DURING THIS VISIT:  New Prescriptions   No medications on file     Note:  This document was prepared using Dragon voice recognition software and may include unintentional dictation errors.   Dr. Arlana Pouch called, advises patient grew E. Coli from urine that  is sensitive to cefepime on his culture data from 1 week ago which the patient was started on Macrobid at that time     Sharyn Creamer, MD 01/27/18 1019

## 2018-01-27 NOTE — Consult Note (Signed)
Pharmacy Antibiotic Note  Tiffany Manning is a 77 y.o. female admitted on 01/27/2018 with sepsis secondary to UTI.  Pharmacy has been consulted for Cefepime dosing. Patient received vancomycin 1g IV and Cefepime 2gm IV x 1 dose in ED  Plan: Start Cefepime 1g IV every 24 hours based on CrCl 11-29 ml/min.   Height: 5\' 4"  (162.6 cm) Weight: 120 lb (54.4 kg) IBW/kg (Calculated) : 54.7  Temp (24hrs), Avg:96.5 F (35.8 C), Min:96.5 F (35.8 C), Max:96.5 F (35.8 C)  Recent Labs  Lab 01/27/18 0845  WBC 22.3*  CREATININE 3.40*  LATICACIDVEN 2.9*    Estimated Creatinine Clearance: 11.7 mL/min (A) (by C-G formula based on SCr of 3.4 mg/dL (H)).    Allergies  Allergen Reactions  . Sulfa Antibiotics Nausea And Vomiting    Antimicrobials this admission: 9/22 Vancomycin >> x1 dose 9/22 Cefepime >>   Microbiology results: 9/22 BCx: pending 9/22 UCx: pending  Thank you for allowing pharmacy to be a part of this patient's care.  Gardner CandleSheema M Rigley Niess, PharmD, BCPS Clinical Pharmacist 01/27/2018 10:21 AM

## 2018-01-28 ENCOUNTER — Inpatient Hospital Stay: Payer: Medicare HMO | Admitting: Anesthesiology

## 2018-01-28 ENCOUNTER — Encounter
Admission: EM | Disposition: A | Discharge status: Skilled Nursing Facility | Payer: Self-pay | Source: Home / Self Care | Attending: Specialist

## 2018-01-28 ENCOUNTER — Inpatient Hospital Stay: Payer: Self-pay

## 2018-01-28 ENCOUNTER — Inpatient Hospital Stay
Admit: 2018-01-28 | Discharge: 2018-01-28 | Disposition: A | Discharge status: Home / Self Care | Payer: Medicare HMO | Attending: Pulmonary Disease | Admitting: Pulmonary Disease

## 2018-01-28 ENCOUNTER — Encounter: Payer: Self-pay | Admitting: Urology

## 2018-01-28 ENCOUNTER — Inpatient Hospital Stay: Payer: Medicare HMO

## 2018-01-28 DIAGNOSIS — E876 Hypokalemia: Secondary | ICD-10-CM

## 2018-01-28 DIAGNOSIS — N189 Chronic kidney disease, unspecified: Secondary | ICD-10-CM

## 2018-01-28 DIAGNOSIS — Z833 Family history of diabetes mellitus: Secondary | ICD-10-CM

## 2018-01-28 DIAGNOSIS — N179 Acute kidney failure, unspecified: Secondary | ICD-10-CM

## 2018-01-28 DIAGNOSIS — I4891 Unspecified atrial fibrillation: Secondary | ICD-10-CM

## 2018-01-28 DIAGNOSIS — Z960 Presence of urogenital implants: Secondary | ICD-10-CM

## 2018-01-28 DIAGNOSIS — W5503XD Scratched by cat, subsequent encounter: Secondary | ICD-10-CM

## 2018-01-28 DIAGNOSIS — S80812D Abrasion, left lower leg, subsequent encounter: Secondary | ICD-10-CM

## 2018-01-28 DIAGNOSIS — E872 Acidosis: Secondary | ICD-10-CM

## 2018-01-28 DIAGNOSIS — A419 Sepsis, unspecified organism: Secondary | ICD-10-CM

## 2018-01-28 DIAGNOSIS — E1110 Type 2 diabetes mellitus with ketoacidosis without coma: Secondary | ICD-10-CM

## 2018-01-28 DIAGNOSIS — E1310 Other specified diabetes mellitus with ketoacidosis without coma: Secondary | ICD-10-CM

## 2018-01-28 DIAGNOSIS — D649 Anemia, unspecified: Secondary | ICD-10-CM

## 2018-01-28 DIAGNOSIS — N1330 Unspecified hydronephrosis: Secondary | ICD-10-CM

## 2018-01-28 DIAGNOSIS — E1122 Type 2 diabetes mellitus with diabetic chronic kidney disease: Secondary | ICD-10-CM

## 2018-01-28 DIAGNOSIS — Z882 Allergy status to sulfonamides status: Secondary | ICD-10-CM

## 2018-01-28 DIAGNOSIS — N10 Acute tubulo-interstitial nephritis: Secondary | ICD-10-CM

## 2018-01-28 DIAGNOSIS — R6520 Severe sepsis without septic shock: Secondary | ICD-10-CM

## 2018-01-28 DIAGNOSIS — R6521 Severe sepsis with septic shock: Secondary | ICD-10-CM

## 2018-01-28 DIAGNOSIS — I129 Hypertensive chronic kidney disease with stage 1 through stage 4 chronic kidney disease, or unspecified chronic kidney disease: Secondary | ICD-10-CM

## 2018-01-28 HISTORY — PX: CYSTOSCOPY WITH STENT PLACEMENT: SHX5790

## 2018-01-28 LAB — BASIC METABOLIC PANEL
Anion gap: 19 — ABNORMAL HIGH (ref 5–15)
Anion gap: 14 (ref 5–15)
Anion gap: 13 (ref 5–15)
Anion gap: 13 (ref 5–15)
Anion gap: 10 (ref 5–15)
BUN: 83 mg/dL — ABNORMAL HIGH (ref 8–23)
BUN: 75 mg/dL — ABNORMAL HIGH (ref 8–23)
BUN: 71 mg/dL — AB (ref 8–23)
BUN: 72 mg/dL — AB (ref 8–23)
BUN: 68 mg/dL — AB (ref 8–23)
BUN: 67 mg/dL — ABNORMAL HIGH (ref 8–23)
CALCIUM: 8 mg/dL — AB (ref 8.9–10.3)
CALCIUM: 7.6 mg/dL — AB (ref 8.9–10.3)
CALCIUM: 7.8 mg/dL — AB (ref 8.9–10.3)
CALCIUM: 7.4 mg/dL — AB (ref 8.9–10.3)
CHLORIDE: 110 mmol/L (ref 98–111)
CO2: <7 mmol/L — ABNORMAL LOW (ref 22–32)
CO2: 17 mmol/L — ABNORMAL LOW (ref 22–32)
CO2: 20 mmol/L — ABNORMAL LOW (ref 22–32)
CO2: 15 mmol/L — ABNORMAL LOW (ref 22–32)
CO2: 19 mmol/L — ABNORMAL LOW (ref 22–32)
CO2: 18 mmol/L — ABNORMAL LOW (ref 22–32)
CREATININE: 2.62 mg/dL — AB (ref 0.44–1.00)
Calcium: 8 mg/dL — ABNORMAL LOW (ref 8.9–10.3)
Calcium: 7.8 mg/dL — ABNORMAL LOW (ref 8.9–10.3)
Chloride: 117 mmol/L — ABNORMAL HIGH (ref 98–111)
Chloride: 114 mmol/L — ABNORMAL HIGH (ref 98–111)
Chloride: 115 mmol/L — ABNORMAL HIGH (ref 98–111)
Chloride: 114 mmol/L — ABNORMAL HIGH (ref 98–111)
Chloride: 116 mmol/L — ABNORMAL HIGH (ref 98–111)
Creatinine, Ser: 2.6 mg/dL — ABNORMAL HIGH (ref 0.44–1.00)
Creatinine, Ser: 2.44 mg/dL — ABNORMAL HIGH (ref 0.44–1.00)
Creatinine, Ser: 2.62 mg/dL — ABNORMAL HIGH (ref 0.44–1.00)
Creatinine, Ser: 2.46 mg/dL — ABNORMAL HIGH (ref 0.44–1.00)
Creatinine, Ser: 2.42 mg/dL — ABNORMAL HIGH (ref 0.44–1.00)
GFR calc Af Amer: 19 mL/min — ABNORMAL LOW (ref >60)
GFR calc Af Amer: 21 mL/min — ABNORMAL LOW (ref >60)
GFR calc Af Amer: 19 mL/min — ABNORMAL LOW (ref >60)
GFR calc Af Amer: 21 mL/min — ABNORMAL LOW (ref >60)
GFR calc Af Amer: 21 mL/min — ABNORMAL LOW (ref >60)
GFR calc non Af Amer: 16 mL/min — ABNORMAL LOW (ref >60)
GFR, EST AFRICAN AMERICAN: 19 mL/min — AB (ref >60)
GFR, EST NON AFRICAN AMERICAN: 17 mL/min — AB (ref >60)
GFR, EST NON AFRICAN AMERICAN: 18 mL/min — AB (ref >60)
GFR, EST NON AFRICAN AMERICAN: 16 mL/min — AB (ref >60)
GFR, EST NON AFRICAN AMERICAN: 18 mL/min — AB (ref >60)
GFR, EST NON AFRICAN AMERICAN: 18 mL/min — AB (ref >60)
GLUCOSE: 203 mg/dL — AB (ref 70–99)
GLUCOSE: 317 mg/dL — AB (ref 70–99)
GLUCOSE: 224 mg/dL — AB (ref 70–99)
Glucose, Bld: 205 mg/dL — ABNORMAL HIGH (ref 70–99)
Glucose, Bld: 159 mg/dL — ABNORMAL HIGH (ref 70–99)
Glucose, Bld: 127 mg/dL — ABNORMAL HIGH (ref 70–99)
POTASSIUM: 3.2 mmol/L — AB (ref 3.5–5.1)
POTASSIUM: 2.2 mmol/L — AB (ref 3.5–5.1)
POTASSIUM: 4.3 mmol/L (ref 3.5–5.1)
POTASSIUM: 3.9 mmol/L (ref 3.5–5.1)
Potassium: 3.2 mmol/L — ABNORMAL LOW (ref 3.5–5.1)
Potassium: 3.9 mmol/L (ref 3.5–5.1)
SODIUM: 148 mmol/L — AB (ref 135–145)
SODIUM: 143 mmol/L (ref 135–145)
SODIUM: 146 mmol/L — AB (ref 135–145)
SODIUM: 144 mmol/L (ref 135–145)
Sodium: 140 mmol/L (ref 135–145)
Sodium: 146 mmol/L — ABNORMAL HIGH (ref 135–145)

## 2018-01-28 LAB — BLOOD GAS, ARTERIAL
Acid-base deficit: 5.5 mmol/L — ABNORMAL HIGH (ref 0.0–2.0)
Acid-base deficit: 6.5 mmol/L — ABNORMAL HIGH (ref 0.0–2.0)
Allens test (pass/fail): POSITIVE — AB
BICARBONATE: 17.1 mmol/L — AB (ref 20.0–28.0)
BICARBONATE: 17.1 mmol/L — AB (ref 20.0–28.0)
FIO2: 32
FIO2: 0.21
FIO2: 0.21
FIO2: 0.24
O2 Saturation: 97 %
O2 Saturation: 98.6 %
PCO2 ART: 19 mmHg — AB (ref 32.0–48.0)
PCO2 ART: 27 mmHg — AB (ref 32.0–48.0)
PH ART: 7.07 — AB (ref 7.350–7.450)
PH ART: 7.41 (ref 7.350–7.450)
Patient temperature: 37
Patient temperature: 37
Patient temperature: 37
Patient temperature: 37
pCO2 arterial: 19 mmHg — CL (ref 32.0–48.0)
pCO2 arterial: 24 mmHg — ABNORMAL LOW (ref 32.0–48.0)
pH, Arterial: 7.03 — CL (ref 7.350–7.450)
pH, Arterial: 7.46 — ABNORMAL HIGH (ref 7.350–7.450)
pO2, Arterial: 154 mmHg — ABNORMAL HIGH (ref 83.0–108.0)
pO2, Arterial: 152 mmHg — ABNORMAL HIGH (ref 83.0–108.0)
pO2, Arterial: 85 mmHg (ref 83.0–108.0)
pO2, Arterial: 116 mmHg — ABNORMAL HIGH (ref 83.0–108.0)

## 2018-01-28 LAB — GLUCOSE, CAPILLARY
GLUCOSE-CAPILLARY: 127 mg/dL — AB (ref 70–99)
GLUCOSE-CAPILLARY: 159 mg/dL — AB (ref 70–99)
GLUCOSE-CAPILLARY: 205 mg/dL — AB (ref 70–99)
GLUCOSE-CAPILLARY: 129 mg/dL — AB (ref 70–99)
GLUCOSE-CAPILLARY: 142 mg/dL — AB (ref 70–99)
GLUCOSE-CAPILLARY: 248 mg/dL — AB (ref 70–99)
GLUCOSE-CAPILLARY: 198 mg/dL — AB (ref 70–99)
GLUCOSE-CAPILLARY: 102 mg/dL — AB (ref 70–99)
GLUCOSE-CAPILLARY: 61 mg/dL — AB (ref 70–99)
GLUCOSE-CAPILLARY: 172 mg/dL — AB (ref 70–99)
Glucose-Capillary: 186 mg/dL — ABNORMAL HIGH (ref 70–99)
Glucose-Capillary: 142 mg/dL — ABNORMAL HIGH (ref 70–99)
Glucose-Capillary: 117 mg/dL — ABNORMAL HIGH (ref 70–99)
Glucose-Capillary: 172 mg/dL — ABNORMAL HIGH (ref 70–99)
Glucose-Capillary: 294 mg/dL — ABNORMAL HIGH (ref 70–99)
Glucose-Capillary: 304 mg/dL — ABNORMAL HIGH (ref 70–99)
Glucose-Capillary: 57 mg/dL — ABNORMAL LOW (ref 70–99)
Glucose-Capillary: 111 mg/dL — ABNORMAL HIGH (ref 70–99)

## 2018-01-28 LAB — BLOOD CULTURE ID PANEL (REFLEXED)
ACINETOBACTER BAUMANNII: NOT DETECTED
CANDIDA KRUSEI: NOT DETECTED
CANDIDA PARAPSILOSIS: NOT DETECTED
CANDIDA TROPICALIS: NOT DETECTED
Candida albicans: NOT DETECTED
Candida glabrata: NOT DETECTED
Enterobacter cloacae complex: NOT DETECTED
Enterobacteriaceae species: NOT DETECTED
Enterococcus species: NOT DETECTED
Escherichia coli: NOT DETECTED
HAEMOPHILUS INFLUENZAE: NOT DETECTED
KLEBSIELLA OXYTOCA: NOT DETECTED
KLEBSIELLA PNEUMONIAE: NOT DETECTED
Listeria monocytogenes: NOT DETECTED
Methicillin resistance: DETECTED — AB
Neisseria meningitidis: NOT DETECTED
PROTEUS SPECIES: NOT DETECTED
Pseudomonas aeruginosa: NOT DETECTED
SERRATIA MARCESCENS: NOT DETECTED
STAPHYLOCOCCUS AUREUS BCID: NOT DETECTED
STAPHYLOCOCCUS SPECIES: DETECTED — AB
Streptococcus agalactiae: NOT DETECTED
Streptococcus pneumoniae: NOT DETECTED
Streptococcus pyogenes: NOT DETECTED
Streptococcus species: NOT DETECTED

## 2018-01-28 LAB — PROCALCITONIN: PROCALCITONIN: 0.91 ng/mL

## 2018-01-28 LAB — C DIFFICILE QUICK SCREEN W PCR REFLEX
C Diff antigen: NEGATIVE
C Diff interpretation: NOT DETECTED
C Diff toxin: NEGATIVE

## 2018-01-28 LAB — CBC
HCT: 30.2 % — ABNORMAL LOW (ref 35.0–47.0)
HEMOGLOBIN: 9.8 g/dL — AB (ref 12.0–16.0)
MCH: 30.1 pg (ref 26.0–34.0)
MCHC: 32.6 g/dL (ref 32.0–36.0)
MCV: 92.5 fL (ref 80.0–100.0)
Platelets: 327 10*3/uL (ref 150–440)
RBC: 3.26 MIL/uL — ABNORMAL LOW (ref 3.80–5.20)
RDW: 14.8 % — ABNORMAL HIGH (ref 11.5–14.5)
WBC: 14.1 10*3/uL — ABNORMAL HIGH (ref 3.6–11.0)

## 2018-01-28 LAB — LACTIC ACID, PLASMA: Lactic Acid, Venous: 2 mmol/L (ref 0.5–1.9)

## 2018-01-28 LAB — BLOOD GAS, VENOUS
Patient temperature: 37
pCO2, Ven: 20 mmHg — ABNORMAL LOW (ref 44.0–60.0)
pH, Ven: 6.99 — CL (ref 7.250–7.430)

## 2018-01-28 LAB — MAGNESIUM
MAGNESIUM: 2 mg/dL (ref 1.7–2.4)
Magnesium: 1.8 mg/dL (ref 1.7–2.4)

## 2018-01-28 LAB — PHOSPHORUS: PHOSPHORUS: 1.6 mg/dL — AB (ref 2.5–4.6)

## 2018-01-28 LAB — TSH: TSH: 1.949 u[IU]/mL (ref 0.350–4.500)

## 2018-01-28 SURGERY — CYSTOSCOPY, WITH STENT INSERTION
Anesthesia: General | Site: Ureter | Laterality: Bilateral

## 2018-01-28 MED ORDER — FENTANYL CITRATE (PF) 100 MCG/2ML IJ SOLN
INTRAMUSCULAR | Status: AC
  Administered 2018-01-28: 25 ug via INTRAVENOUS
  Filled 2018-01-28: qty 2

## 2018-01-28 MED ORDER — INSULIN ASPART 100 UNIT/ML ~~LOC~~ SOLN
0.0000 [IU] | SUBCUTANEOUS | Status: DC
  Administered 2018-01-28: 2 [IU] via SUBCUTANEOUS
  Administered 2018-01-29: 1 [IU] via SUBCUTANEOUS
  Administered 2018-01-29: 2 [IU] via SUBCUTANEOUS
  Administered 2018-01-29 (×2): 3 [IU] via SUBCUTANEOUS
  Administered 2018-01-29: 2 [IU] via SUBCUTANEOUS
  Administered 2018-01-30 – 2018-01-31 (×4): 1 [IU] via SUBCUTANEOUS
  Filled 2018-01-28 (×10): qty 1

## 2018-01-28 MED ORDER — CHLORHEXIDINE GLUCONATE 0.12 % MT SOLN
15.0000 mL | Freq: Two times a day (BID) | OROMUCOSAL | Status: DC
  Administered 2018-01-28 – 2018-02-06 (×15): 15 mL via OROMUCOSAL
  Filled 2018-01-28 (×16): qty 15

## 2018-01-28 MED ORDER — POTASSIUM PHOSPHATES 15 MMOLE/5ML IV SOLN
20.0000 mmol | Freq: Once | INTRAVENOUS | Status: AC
  Administered 2018-01-28: 20 mmol via INTRAVENOUS
  Filled 2018-01-28: qty 6.67

## 2018-01-28 MED ORDER — INSULIN GLARGINE 100 UNIT/ML ~~LOC~~ SOLN
6.0000 [IU] | Freq: Every day | SUBCUTANEOUS | Status: DC
  Administered 2018-01-28: 6 [IU] via SUBCUTANEOUS
  Filled 2018-01-28: qty 0.06

## 2018-01-28 MED ORDER — DEXTROSE 50 % IV SOLN
12.5000 g | Freq: Once | INTRAVENOUS | Status: AC
  Administered 2018-01-28: 12.5 g via INTRAVENOUS
  Filled 2018-01-28: qty 50

## 2018-01-28 MED ORDER — LACTATED RINGERS IV BOLUS
1000.0000 mL | Freq: Once | INTRAVENOUS | Status: AC
  Administered 2018-01-28: 1000 mL via INTRAVENOUS

## 2018-01-28 MED ORDER — FENTANYL CITRATE (PF) 100 MCG/2ML IJ SOLN
INTRAMUSCULAR | Status: DC | PRN
  Administered 2018-01-28 (×4): 25 ug via INTRAVENOUS

## 2018-01-28 MED ORDER — FENTANYL CITRATE (PF) 100 MCG/2ML IJ SOLN
25.0000 ug | INTRAMUSCULAR | Status: DC | PRN
  Administered 2018-01-28 (×4): 25 ug via INTRAVENOUS

## 2018-01-28 MED ORDER — SODIUM CHLORIDE 0.9 % IV SOLN
INTRAVENOUS | Status: DC | PRN
  Administered 2018-01-28: 18:00:00 via INTRAVENOUS

## 2018-01-28 MED ORDER — POTASSIUM CHLORIDE 20 MEQ PO PACK
60.0000 meq | PACK | ORAL | Status: AC
  Administered 2018-01-28: 20 meq via ORAL
  Administered 2018-01-28: 60 meq via ORAL
  Filled 2018-01-28 (×2): qty 3

## 2018-01-28 MED ORDER — PROMETHAZINE HCL 25 MG/ML IJ SOLN
12.5000 mg | Freq: Once | INTRAMUSCULAR | Status: AC
  Administered 2018-01-28: 12.5 mg via INTRAVENOUS
  Filled 2018-01-28: qty 1

## 2018-01-28 MED ORDER — VANCOMYCIN HCL IN DEXTROSE 750-5 MG/150ML-% IV SOLN
750.0000 mg | INTRAVENOUS | Status: DC
  Administered 2018-01-30: 750 mg via INTRAVENOUS
  Filled 2018-01-28 (×3): qty 150

## 2018-01-28 MED ORDER — PROPOFOL 500 MG/50ML IV EMUL
INTRAVENOUS | Status: DC | PRN
  Administered 2018-01-28: 25 ug/kg/min via INTRAVENOUS

## 2018-01-28 MED ORDER — KCL IN DEXTROSE-NACL 40-5-0.45 MEQ/L-%-% IV SOLN
INTRAVENOUS | Status: DC
  Administered 2018-01-28: 06:00:00 via INTRAVENOUS
  Administered 2018-01-28: 100 mL/h via INTRAVENOUS
  Administered 2018-01-29: 03:00:00 via INTRAVENOUS
  Filled 2018-01-28 (×5): qty 1000

## 2018-01-28 MED ORDER — ORAL CARE MOUTH RINSE
15.0000 mL | Freq: Two times a day (BID) | OROMUCOSAL | Status: DC
  Administered 2018-01-28 – 2018-02-02 (×4): 15 mL via OROMUCOSAL

## 2018-01-28 MED ORDER — POTASSIUM CHLORIDE 10 MEQ/100ML IV SOLN
10.0000 meq | INTRAVENOUS | Status: AC
  Administered 2018-01-28 (×4): 10 meq via INTRAVENOUS
  Filled 2018-01-28 (×4): qty 100

## 2018-01-28 MED ORDER — ONDANSETRON HCL 4 MG/2ML IJ SOLN: 4.0000 mg | Freq: Once | INTRAMUSCULAR | Status: DC | PRN

## 2018-01-28 MED ORDER — PROPOFOL 10 MG/ML IV BOLUS
INTRAVENOUS | Status: DC | PRN
  Administered 2018-01-28: 30 mg via INTRAVENOUS

## 2018-01-28 SURGICAL SUPPLY — 18 items
BAG DRAIN CYSTO-URO LG1000N (MISCELLANEOUS) ×3 IMPLANT
CANISTER SUCT LVC 12 LTR MEDI- (MISCELLANEOUS) ×3 IMPLANT
CATH URETL 5X70 OPEN END (CATHETERS) ×3 IMPLANT
DRAPE XRAY CASSETTE 23X24 (DRAPES) ×3 IMPLANT
GOWN STRL REUS W/ TWL LRG LVL4 (GOWN DISPOSABLE) ×1 IMPLANT
GOWN STRL REUS W/ TWL XL LVL3 (GOWN DISPOSABLE) ×1 IMPLANT
GOWN STRL REUS W/TWL LRG LVL4 (GOWN DISPOSABLE) ×2
GOWN STRL REUS W/TWL XL LVL3 (GOWN DISPOSABLE) ×2
KIT TURNOVER CYSTO (KITS) ×3 IMPLANT
NS IRRIG 500ML POUR BTL (IV SOLUTION) ×3 IMPLANT
PACK CYSTO AR (MISCELLANEOUS) ×3 IMPLANT
SENSORWIRE 0.038 NOT ANGLED (WIRE) ×6
SET CYSTO W/LG BORE CLAMP LF (SET/KITS/TRAYS/PACK) ×3 IMPLANT
SOL .9 NS 3000ML IRR  AL (IV SOLUTION) ×2
SOL .9 NS 3000ML IRR UROMATIC (IV SOLUTION) ×1 IMPLANT
STENT URET 6FRX24 CONTOUR (STENTS) ×6 IMPLANT
WATER STERILE IRR 1000ML POUR (IV SOLUTION) ×3 IMPLANT
WIRE SENSOR 0.038 NOT ANGLED (WIRE) ×2 IMPLANT

## 2018-01-28 NOTE — Progress Notes (Signed)
[facility] [place] [facility]   PATIENT NAME: Tiffany LangeBarbara Manning    MR#:  [id number]  DATE OF BIRTH:  05/07/1941  SUBJECTIVE:   Patient admitted to the hospital yesterday secondary to severe sepsis secondary to UTI along with acute diabetic ketoacidosis.  Anion Gap is still not completely closed and therefore patient remains on insulin drip.  Patient's Foley catheter is draining cloudy urine still.  Patient remains critically ill.  REVIEW OF SYSTEMS:    Review of Systems  Unable to perform ROS: Mental acuity    Nutrition: NPO Tolerating Diet: No Tolerating PT: Await Eval.   DRUG ALLERGIES:   Allergies  Allergen Reactions  . Sulfa Antibiotics Nausea And Vomiting    VITALS:  Blood pressure 111/76, pulse (!) 104, temperature 98.1 F (36.7 C), temperature source Axillary, resp. rate (!) 24, height 5' (1.524 m), weight 57.4 kg, SpO2 100 %.  PHYSICAL EXAMINATION:   Physical Exam  GENERAL:  77 y.o.-year-old patient lying in bed moaning at times.    EYES: Pupils equal, round, reactive to light and accommodation. No scleral icterus. Extraocular muscles intact.  HEENT: Head atraumatic, normocephalic. Oropharynx and nasopharynx clear. Dry Oral Mucosa NECK:  Supple, no jugular venous distention. No thyroid enlargement, no tenderness.  LUNGS: Normal breath sounds bilaterally, no wheezing, rales, rhonchi. No use of accessory muscles of respiration.  CARDIOVASCULAR: S1, S2 Irregular. No murmurs, rubs, or gallops.  ABDOMEN: Soft, nontender, nondistended. Bowel sounds present. No organomegaly or mass.  EXTREMITIES: No cyanosis, clubbing or edema b/l.    NEUROLOGIC: Cranial nerves II through XII are intact. No focal Motor or sensory deficits b/l. Globally weak.   PSYCHIATRIC: The patient is alert and oriented x 3.  SKIN: No obvious rash, lesion, or ulcer.   Cloudy Urine draining in Foley   LABORATORY PANEL:   CBC Recent Labs  Lab 01/28/18 0257  WBC 14.1*  HGB  9.8*  HCT 30.2*  PLT 327   ------------------------------------------------------------------------------------------------------------------  Chemistries  Recent Labs  Lab 01/27/18 0845  01/28/18 1313  NA 139   < > 143  K 3.7   < > 4.3  CL 106   < > 115*  CO2 8*   < > 15*  GLUCOSE 328*   < > 317*  BUN 96*   < > 72*  CREATININE 3.40*   < > 2.62*  CALCIUM 8.9   < > 7.6*  MG  --    < > 1.8  AST 21  --   --   ALT 23  --   --   ALKPHOS 104  --   --   BILITOT 1.0  --   --    < > = values in this interval not displayed.   ------------------------------------------------------------------------------------------------------------------  Cardiac Enzymes No results for input(s): TROPONINI in the last 168 hours. ------------------------------------------------------------------------------------------------------------------  RADIOLOGY:  Ct Abdomen Pelvis Wo Contrast  Result Date: 01/28/2018 CLINICAL DATA:  Inpatient. Elevated lipase. Severe nausea and vomiting. Abdominal pain. Prior hysterectomy. EXAM: CT ABDOMEN AND PELVIS WITHOUT CONTRAST TECHNIQUE: Multidetector CT imaging of the abdomen and pelvis was performed following the standard protocol without IV contrast. COMPARISON:  01/27/2018 abdominal radiograph. 09/19/2011 CT abdomen/pelvis. FINDINGS: Lower chest: Right middle lobe 7 mm solid pulmonary nodule (series 3/image 6), increased from 5 mm in 2013 CT. Coronary atherosclerosis. Hepatobiliary: Normal liver size. No liver mass. Cholecystectomy. No biliary ductal dilatation. CBD diameter 5 mm. Pancreas: Stable coarse uncinate process pancreatic calcification, nonspecific, possibly vascular. No pancreatic mass  or duct dilation. No significant peripancreatic fat stranding or fluid. Spleen: Normal size spleen. Bandlike low-attenuation focus in the lower spleen (series 2/image 20), new. Adrenals/Urinary Tract: Normal adrenals. Chronic severe asymmetric atrophy of the left kidney. Moderate  bilateral hydroureteronephrosis to the level of the ureterovesical junction bilaterally. Hyperdense 1.1 cm anterior upper right renal cortical lesion, stable in size since 2013 although with new hyperdensity. Exophytic simple 1.1 cm posterior lower left renal cyst. No additional contour deforming renal lesions. No urolithiasis. Bladder is partially collapsed by indwelling Foley catheter with nondependent bladder gas compatible with instrumentation. No definite bladder wall thickening. Stomach/Bowel: Enteric tube terminates in the distal stomach. Collapsed and grossly normal stomach. Normal caliber small bowel with no small bowel wall thickening. Normal appendix. Normal large bowel with no diverticulosis, large bowel wall thickening or pericolonic fat stranding. Vascular/Lymphatic: Atherosclerotic nonaneurysmal abdominal aorta. No pathologically enlarged lymph nodes in the abdomen or pelvis. Reproductive: Status post hysterectomy, with no abnormal findings at the vaginal cuff. No adnexal mass. Other: No pneumoperitoneum, ascites or focal fluid collection. Moderate fat containing infraumbilical ventral abdominal hernia. Musculoskeletal: No aggressive appearing focal osseous lesions. Mild thoracolumbar spondylosis. Scattered subcentimeter sclerotic osseous lesions in sacrum and bilateral pelvic girdle are similar to prior CT, probably benign bone islands. IMPRESSION: 1. Moderate bilateral hydroureteronephrosis to the level of the UVJ bilaterally, despite the presence of an indwelling Foley catheter, which only partially collapses the bladder. No evidence of obstructing urolithiasis or mass on this noncontrast scan. 2. No convincing noncontrast CT findings of acute pancreatitis. 3. Bandlike low-attenuation focus in the inferior spleen, poorly characterized on this noncontrast scan, new since 2013 CT. Cannot exclude splenic infarct or mass. Suggest attention on follow-up MRI or CT abdomen in 3-6 months. 4. Enteric tube  terminates in the distal stomach. No evidence of bowel obstruction or acute bowel inflammation. 5. Right middle lobe 7 mm solid pulmonary nodule, mildly increased in size since 2013 CT. This nodule remains below PET resolution. Suggest follow-up chest CT in 6 months. 6. Moderate fat containing infraumbilical ventral abdominal hernia. 7.  Aortic Atherosclerosis (ICD10-I70.0). Electronically Signed   By: Delbert Phenix M.D.   On: 01/28/2018 02:54   Dg Abd 1 View  Result Date: 01/27/2018 CLINICAL DATA:  NG tube placement EXAM: ABDOMEN - 1 VIEW COMPARISON:  01/27/2018 FINDINGS: NG tube coils in the stomach with the tip in the fundus. Nonobstructive bowel gas pattern. Prior cholecystectomy. IMPRESSION: NG tube coils in the stomach with the tip in the fundus. Electronically Signed   By: Charlett Nose M.D.   On: 01/27/2018 19:14   Dg Abd 1 View  Result Date: 01/27/2018 CLINICAL DATA:  NG tube placement EXAM: ABDOMEN - 1 VIEW COMPARISON:  01/27/2018 FINDINGS: NG tube is in the stomach. Nonobstructive bowel gas pattern. Visualized lung bases clear. Prior cholecystectomy. IMPRESSION: NG tube tip in the mid stomach. Electronically Signed   By: Charlett Nose M.D.   On: 01/27/2018 18:50   US Renal  Result Date: 01/27/2018 CLINICAL DATA:  Acute renal failure EXAM: RENAL / URINARY TRACT ULTRASOUND COMPLETE COMPARISON:  CT abdomen/pelvis dated 09/19/2011 FINDINGS: Right Kidney: Length: 10.5 cm. Echogenic renal parenchyma. 16 x 14 x 10 mm lower pole cyst. No hydronephrosis. Left Kidney: Length: 8.6 cm. Cortical scarring with lobular parenchyma. Echogenic renal parenchyma. Stable hydronephrosis versus extrarenal pelvis. Bladder: Within normal limits. IMPRESSION: Echogenic renal parenchyma, suggesting medical renal disease. Left renal cortical scarring with stable hydronephrosis versus extrarenal pelvis, chronic. This appearance is unchanged from 2013.  1.6 cm right lower pole renal cyst, simple. Electronically Signed   By:  Charline Bills M.D.   On: 01/27/2018 12:41   Dg Chest Port 1 View  Result Date: 01/27/2018 CLINICAL DATA:  Fall this morning. Patient reports being diagnosed with a kidney infection last week. Vomiting. History of diabetes and hypertension. EXAM: PORTABLE CHEST 1 VIEW COMPARISON:  05/17/2006 radiographs.  CT neck 05/28/2017 FINDINGS: 0845 hours. The heart size is at the upper limits of normal for AP technique. There is aortic atherosclerosis. There is a left upper lobe lung nodule measuring approximately 14 mm (not corrected for magnification), unchanged from previous neck CT. The lungs are otherwise clear. There is no pleural effusion or pneumothorax. No acute osseous findings are evident. IMPRESSION: 1. No acute cardiopulmonary process. 2. Left upper lobe pulmonary nodule, which was enlarging on neck CT performed in January. This remains concerning for malignancy, and further evaluation recommended if not performed elsewhere. Electronically Signed   By: Carey Bullocks M.D.   On: 01/27/2018 09:17   Dg Abd Portable 2 Views  Result Date: 01/27/2018 CLINICAL DATA:  Fall this morning, patient has been weak for the past couple weeks and states that she was diagnosed with kidney infection a week ago and that pt has been having dark blood like emesis for the past couple days. Possible sepsis. Hx of diabetes, HTN, abdominal hysterectomy. EXAM: PORTABLE ABDOMEN - 2 VIEW COMPARISON:  CT, 09/19/2011 FINDINGS: Normal bowel gas pattern.  No free air. No evidence of renal or ureteral stones. Clips are upper quadrant reflect a prior cholecystectomy. There are scattered vascular calcifications. No acute skeletal abnormality.  Lung bases are clear. IMPRESSION: 1. No acute findings.  No evidence bowel obstruction or free air. Electronically Signed   By: Amie Portland M.D.   On: 01/27/2018 09:14   [place] Ekg Site Rite  Result Date: 01/28/2018 If Site Rite image not attached, placement could not be confirmed due to current  cardiac rhythm.    ASSESSMENT AND PLAN:   77 year old female with past medical history of diabetes, hypertension, hyperlipidemia, previous history of a neck abscess who presents to the hospital due to weakness, lethargy and malaise and noted to have severe sepsis secondary to UTI and also noted to be in acute diabetic ketoacidosis.  1.  Acute diabetic ketoacidosis- secondary to underlying sepsis.  -Blood sugars have improved since yesterday.  Anion gap is not completely closed, continue insulin drip, IV fluids, follow serial metabolic profiles.  2.  Sepsis-patient has severe sepsis secondary to UTI. - Continue aggressive IV fluid hydration, hemodynamics have improved.  Continue IV cefepime, vancomycin, follow cultures.  Await infectious disease input.  3.  Urinary tract infection-source of patient's sepsis. -We will treat with IV cefepime, follow cultures.  As per the patient's primary care physician she had a urine culture which grew out E. coli which was pre-much pansensitive other than intermediately sensitive to ampicillin and cefuroxime. -Patient had a renal ultrasound done which showed some scarring and a CT scan of the abdomen pelvis suggestive of bilateral hydronephrosis.  Await further urology input.  No acute obstruction.  4.  Acute kidney injury secondary to severe sepsis and underlying UTI combined with concomitant use of diuretics. - Seen by nephrology, like in the setting of multifactorial issues.  Patient is not oliguric.  No acute indication for dialysis as per nephrology.  Continue treatment of underlying sepsis and UTI as mentioned above.  5.  Leukocytosis- secondary to sepsis/UTI. - Follow with IV  antibiotic therapy and it's improving.   6.  Essential hypertension-hold patient's antihypertensives given patient's DKA and relative hypotension.   All the records are reviewed and case discussed with Care Management/Social Worker. Management plans discussed with the  patient, family and they are in agreement.  CODE STATUS: Full code  DVT Prophylaxis: Hep SQ  TOTAL TIME TAKING CARE OF THIS PATIENT: 30 minutes.   POSSIBLE D/C IN 2-3 DAYS, DEPENDING ON CLINICAL CONDITION.   Houston Siren M.D on 01/28/2018 at 3:29 PM  Between 7am to 6pm - Pager - [phone]  After 6pm go to [email or web address] - Social research officer, government  [facility] [place] [facility]  Office  [phone]  CC: Primary care physician; Jaclyn Shaggy, MD

## 2018-01-28 NOTE — Progress Notes (Signed)
Spoke with Luci Bankukov, NP and made her aware that patient complains of slight nausea and that nurse is concerned about giving potassium down NG tube with patient having nausea.  Luci Bankukov, NP gave order to give patient one time dose of 12.5 mg IV phenergan and then to given potassium via NG tube.

## 2018-01-28 NOTE — Progress Notes (Signed)
Lahoma Rocker, M.D Pulmonary Critical Care & Sleep Medicine   ICU Patient Progress Note  Name: Tiffany Manning  DOB: [birth year]  MRN: [id number]  Date: 01/28/2018   _0 I have reviewed the flowsheet and previous day's notes.  IMPRESSION:  Patient Active Problem List   Diagnosis Date Noted  . Severe sepsis ([place]) 01/27/2018  . Pressure injury of skin 01/27/2018  . 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 Protein-calorie malnutrition, severe 05/29/2017  . Acute renal failure ([place]) 05/28/2017  . Acute renal failure (ARF) ([place]) 05/28/2017  1, Severe sepsis with lactic acidosis related to UTI/urosepsis Blood and urine c/s. broad spectrum abx, cefepime and vancomycin MRSA in initial culture. Echo and RPT culture ordered Hydro on CT will get urology consult Acidosis and cr slowly improving Not requiring any pressors  2.Acute renal failure, ATN vs obstruction, Give another liter fluid bolus  -- K replaced -- Phos replaced -- Monitor CK -- BMP and MG every 4-8 hours  3. DKA , IVF, insulin drip, DKA protocol.oral diabetic meds on hold  4. Elevated Lipase most likely due to stress -- CT abd not suggestive of any pancreatitis -- Will need out patient eval on splenic abnormality  5.CARDIOVASCULAR -- Afib, ? New onset, Check TSH, Give IV fluids, Echo, Cardiology consult, [facility] per cardiology  6.GASTROINTESTINAL N/V related to DKA, NPO ,prn zofran   7.NEUROLOGIC, no defecits  8. Hold hyperlipidemic and BP meds  9. Enlarging LUL nodule, will need to be addressed as outpt PET CT vs CT FNA vs resection  PLAN:  PYK:DXIP Cardiology consult, RPT ekg done, Check TSH, IF bp allows resume betablocker RS:On RA doing well -- Upper lobe nodules seen on 2015 CT at [place] also showed LUL spiculated nodule which will need out patient follow up -- Aspiration precaution JA:SNKNL and cefepime adjust -- Follow culture and adjust ENDO:Anion gap is closed but as patient has been vomiting due to  sepsis will continue insulin drip for now and monitor -- SSI monitor blood sugar ZJ:QBHALP due to sepsis continue pepcid, Monitor lipase, NPO for now  RENAL:Cr improving, Urology consult  -- Electrolyte replacement protocol, Monitor Cr and K  CNS:no issues  HEMATOLOGY:monitor hb -- Monitor HB and PLT MUSCULOSKELETAL:MOnitor CK PAIN AND SEDATION:PRN meds  Skin/Wound: Chronich changes   Electrolytes: Replace electrolytes per ICU electrolyte replacement protocol.   IVF: none  Nutrition: NPO for now  Prophylaxis: DVT Prophylaxis with heparin,. GI Prophylaxis.   Restraints: none  PT/OT eval and treat. OOB when appropriate.   Lines/Tubes:  9/22 foley  PICC order placed for 9/22central line. ADVANCE DIRECTIVE:full code  FAMILY DISCUSSION:spoke with husband at length Quality Care: PPI, DVT prophylaxis, HOB elevated, Infection control all reviewed and addressed. Events and notes from last 24 hours reviewed. Care plan discussed on multidisciplinary rounds CC TIME:40 min    Subjective:  HISTORY OF PRESENT ILLNESS    [name] a77 y.o.femalewith a known history of diabetes, hypertension, hyperlipidemia who presents to the ER due to generalized weakness fatigue and just not feeling well over the past week. Patient has had significant malaise and just generalized weakness over the past week progressively getting worse went to see her primary care physician and was placed on oral antibiotic( nitrofurantoin as per pt) for suspected UTI. She continued to feel poorly and was not getting better and felt worse today and decided to come to ER. She has been nauseated and has been throwing up and unable to keep anything down. In the ER she  was noted to be hypotensive, tachycardic, noted to have a leukocytosis suspected to have severe sepsis secondary to UTI and hospitalist services were contacted for admission. . In the emergency room patient was noted to be in acute kidney injury with  severe hyperglycemia, and noted to be acidotic. She was assesed to have DKA, severe sepsis, ARF and UTI and transferred to ICU. In the ICU she cont to feel poorly with severe weakness with ongoing nausea, has not vomited so for in the icu.she denies scough sob or CP. She denies fever but reports chills over the last week. An in/out foley in ER had revealed purulent urine  Antimicrobials this admission: 9/22 Vancomycin >>  9/22 Cefepime >>   Microbiology results: 9/22 BCx: MRSA 9/22 UCx: pending  Subjective: Overnight managed with iV fluids, Bicarb and Insulin drip per DKA protocol Not on any pressors Complains of nausea BS is better, A Gap is improved to 14 and K is improved to 3.2 Bicarb also improved to 20  Cr is improving 2.6 Liter positive  Significant event: 9-22 admission 9-22 CT abd showed no pancreatitis, ? Splenic band like densitiy, RML nodule looks slightly bigger than 2013  _0 The patient is critically ill on     _1 Mechanical ventilation _2 Pressors  _3 BiPAP _4    MEDICATIONS: _5 @   ALLERGIES: Allergies  Allergen Reactions  . Sulfa Antibiotics Nausea And Vomiting     Vital Signs:   BP 111/76   Pulse (!) 104   Temp 98.1 F (36.7 C) (Axillary)   Resp (!) 24   Ht 5' (1.524 m)   Wt 57.4 kg   SpO2 100%   BMI 24.71 kg/m       Prehospital Care Cardiac Rhythm: Atrial fibrillation  Temp (24hrs), Avg:97.9 F (36.6 C), Min:96.5 F (35.8 C), Max:98.9 F (37.2 C)   Intake/Output:Last shift:      09/23 0701 - 09/23 1900 In: -  Out: 35 [Urine:35]Last 3 shifts: 09/21 1901 - 09/23 0700 In: 3631.3 [I.V.:1311.3] Out: 975 [Urine:765]  Intake/Output Summary (Last 24 hours) at 01/28/2018 0945 Last data filed at 01/28/2018 0800 Gross per 24 hour  Intake 3631.3 ml  Output 1010 ml  Net 2621.3 ml    Ventilator Settings:      ARDS network Guidelines: Lung protective strategy and Pl pressure goals<30  Objective:   Physical Exam:  General: comfortable,  no acute distress No edema,cyanosis,clubbing or pallor.no jaundice Mouth dry, pupils equal and reactive cvs s1 and s2, tachycardic with PVCs Chest CTA without wheezing or sig crackles abd soft non distended .+ tenderness left flank/renal are.+ BS. No organomegaly Cns awake and alert x 3 and appropiate non focal, moves all extremeties LLE  4x2 inches superficial ulcer in the mid leg lateral side with exudate  DATA:    Telemetry: _6 Sinus _7 A-flutter _8 Paced   _9 A-fib _10 Multiple PVC's   Labs:  CBC w/Diff Recent Labs  Lab 01/27/18 0845 01/27/18 1905 01/28/18 0257  HGB 11.7* 10.2* 9.8*  HCT 37.2 32.0* 30.2*  WBC 22.3*  --  14.1*  PLT 491*  --  327     Chemistry Recent Labs  Lab 01/27/18 2249 01/28/18 0257 01/28/18 0839  NA 146* 146* 148*  K 2.8* 2.2* 3.2*  CL 116* 110 114*  CO2 17* 17* 20*  BUN 83* 75* 71*  CREATININE 2.46* 2.62* 2.44*  GLUCOSE 172* 205* 159*    Hepatic Function Latest Ref Rng & Units 01/27/2018 05/28/2017 10/06/2013  Total Protein 6.5 - 8.1 g/dL 7.9 8.3(H) 8.4(H)  Albumin 3.5 - 5.0 g/dL 3.8 3.3(L) 3.3(L)  AST 15 - 41 U/L _0 ALT 0 - 44 U/L 23 10(L) 19  Alk Phosphatase 38 - 126 U/L 104 81 100  Total Bilirubin 0.3 - 1.2 mg/dL 1.0 0.6 0.4     Lactic Acid    Component Value Date/Time   LATICACIDVEN 2.4 (HH) 01/27/2018 2249     Micro  _1 @ Recent Results (from the past 720 hour(s))  Blood Culture (routine x 2)     Status: None (Preliminary result)   Collection Time: 01/27/18  8:40 AM  Result Value Ref Range Status   Specimen Description BLOOD RIGHT ANTECUBITAL  Final   Special Requests   Final    BOTTLES DRAWN AEROBIC AND ANAEROBIC Blood Culture adequate volume   Culture  Setup Time   Final    Organism ID to follow AEROBIC BOTTLE ONLY GRAM POSITIVE COCCI CRITICAL RESULT CALLED TO, READ BACK BY AND VERIFIED WITH: MATT [facility] 01/28/18 0618 REC Performed at [facility], [address] [place], [place] [address]    Culture  GRAM POSITIVE COCCI  Final   Report Status PENDING  Incomplete  Blood Culture ID Panel (Reflexed)     Status: Abnormal   Collection Time: 01/27/18  8:40 AM  Result Value Ref Range Status   Enterococcus species NOT DETECTED NOT DETECTED Final   Listeria monocytogenes NOT DETECTED NOT DETECTED Final   Staphylococcus species DETECTED (A) NOT DETECTED Final    Comment: Methicillin (oxacillin) resistant coagulase negative staphylococcus. Possible blood culture contaminant (unless isolated from more than one blood culture draw or clinical case suggests pathogenicity). No antibiotic treatment is indicated for blood  culture contaminants. CRITICAL RESULT CALLED TO, READ BACK BY AND VERIFIED WITH: MATT MCBANE 01/28/18 0618 REC    Staphylococcus aureus NOT DETECTED NOT DETECTED Final   Methicillin resistance DETECTED (A) NOT DETECTED Final    Comment: CRITICAL RESULT CALLED TO, READ BACK BY AND VERIFIED WITH: MATT MCBANE 01/28/18 0618 REC    Streptococcus species NOT DETECTED NOT DETECTED Final   Streptococcus agalactiae NOT DETECTED NOT DETECTED Final   Streptococcus pneumoniae NOT DETECTED NOT DETECTED Final   Streptococcus pyogenes NOT DETECTED NOT DETECTED Final   Acinetobacter baumannii NOT DETECTED NOT DETECTED Final   Enterobacteriaceae species NOT DETECTED NOT DETECTED Final   Enterobacter cloacae complex NOT DETECTED NOT DETECTED Final   Escherichia coli NOT DETECTED NOT DETECTED Final   Klebsiella oxytoca NOT DETECTED NOT DETECTED Final   Klebsiella pneumoniae NOT DETECTED NOT DETECTED Final   Proteus species NOT DETECTED NOT DETECTED Final   Serratia marcescens NOT DETECTED NOT DETECTED Final   Haemophilus influenzae NOT DETECTED NOT DETECTED Final   Neisseria meningitidis NOT DETECTED NOT DETECTED Final   Pseudomonas aeruginosa NOT DETECTED NOT DETECTED Final   Candida albicans NOT DETECTED NOT DETECTED Final   Candida glabrata NOT DETECTED NOT DETECTED Final   Candida krusei NOT  DETECTED NOT DETECTED Final   Candida parapsilosis NOT DETECTED NOT DETECTED Final   Candida tropicalis NOT DETECTED NOT DETECTED Final    Comment: Performed at [facility], [address] [place],  [address]  Blood Culture (routine x 2)     Status: None (Preliminary result)   Collection Time: 01/27/18  8:46 AM  Result Value Ref Range Status   Specimen Description BLOOD LEFT ANTECUBITAL  Final   Special Requests   Final    BOTTLES DRAWN AEROBIC AND ANAEROBIC Blood Culture adequate volume  Culture   Final    NO GROWTH < 24 HOURS Performed at [facility], [address] [place], [place] [address]    Report Status PENDING  Incomplete  MRSA PCR Screening     Status: None   Collection Time: 01/27/18 11:25 AM  Result Value Ref Range Status   MRSA by PCR NEGATIVE NEGATIVE Final    Comment:        The GeneXpert MRSA Assay (FDA approved for NASAL specimens only), is one component of a comprehensive MRSA colonization surveillance program. It is not intended to diagnose MRSA infection nor to guide or monitor treatment for MRSA infections. Performed at [facility], [address] [place],  [address]      ABG    Component Value Date/Time   PHART 7.46 (H) 01/28/2018 0141   PCO2ART 24 (L) 01/28/2018 0141   PO2ART 85 01/28/2018 0141   HCO3 17.1 (L) 01/28/2018 0141   ACIDBASEDEF 5.5 (H) 01/28/2018 0141   O2SAT 97.0 01/28/2018 0141     Cardiac Enzymes Recent Labs    01/27/18 1004  CKTOTAL 49      Coagulation Lab Results  Component Value Date   INR 1.18 01/27/2018     Radiology Results Reviewed by me and compared with previous imaging studies Ct Abdomen Pelvis Wo Contrast  Result Date: 01/28/2018 CLINICAL DATA:  Inpatient. Elevated lipase. Severe nausea and vomiting. Abdominal pain. Prior hysterectomy. EXAM: CT ABDOMEN AND PELVIS WITHOUT CONTRAST TECHNIQUE: Multidetector CT imaging of the abdomen and pelvis was performed  following the standard protocol without IV contrast. COMPARISON:  01/27/2018 abdominal radiograph. 09/19/2011 CT abdomen/pelvis. FINDINGS: Lower chest: Right middle lobe 7 mm solid pulmonary nodule (series 3/image 6), increased from 5 mm in 2013 CT. Coronary atherosclerosis. Hepatobiliary: Normal liver size. No liver mass. Cholecystectomy. No biliary ductal dilatation. CBD diameter 5 mm. Pancreas: Stable coarse uncinate process pancreatic calcification, nonspecific, possibly vascular. No pancreatic mass or duct dilation. No significant peripancreatic fat stranding or fluid. Spleen: Normal size spleen. Bandlike low-attenuation focus in the lower spleen (series 2/image 20), new. Adrenals/Urinary Tract: Normal adrenals. Chronic severe asymmetric atrophy of the left kidney. Moderate bilateral hydroureteronephrosis to the level of the ureterovesical junction bilaterally. Hyperdense 1.1 cm anterior upper right renal cortical lesion, stable in size since 2013 although with new hyperdensity. Exophytic simple 1.1 cm posterior lower left renal cyst. No additional contour deforming renal lesions. No urolithiasis. Bladder is partially collapsed by indwelling Foley catheter with nondependent bladder gas compatible with instrumentation. No definite bladder wall thickening. Stomach/Bowel: Enteric tube terminates in the distal stomach. Collapsed and grossly normal stomach. Normal caliber small bowel with no small bowel wall thickening. Normal appendix. Normal large bowel with no diverticulosis, large bowel wall thickening or pericolonic fat stranding. Vascular/Lymphatic: Atherosclerotic nonaneurysmal abdominal aorta. No pathologically enlarged lymph nodes in the abdomen or pelvis. Reproductive: Status post hysterectomy, with no abnormal findings at the vaginal cuff. No adnexal mass. Other: No pneumoperitoneum, ascites or focal fluid collection. Moderate fat containing infraumbilical ventral abdominal hernia. Musculoskeletal: No  aggressive appearing focal osseous lesions. Mild thoracolumbar spondylosis. Scattered subcentimeter sclerotic osseous lesions in sacrum and bilateral pelvic girdle are similar to prior CT, probably benign bone islands. IMPRESSION: 1. Moderate bilateral hydroureteronephrosis to the level of the UVJ bilaterally, despite the presence of an indwelling Foley catheter, which only partially collapses the bladder. No evidence of obstructing urolithiasis or mass on this noncontrast scan. 2. No convincing noncontrast CT findings of acute pancreatitis. 3. Bandlike low-attenuation focus  in the inferior spleen, poorly characterized on this noncontrast scan, new since 2013 CT. Cannot exclude splenic infarct or mass. Suggest attention on follow-up MRI or CT abdomen in 3-6 months. 4. Enteric tube terminates in the distal stomach. No evidence of bowel obstruction or acute bowel inflammation. 5. Right middle lobe 7 mm solid pulmonary nodule, mildly increased in size since 2013 CT. This nodule remains below PET resolution. Suggest follow-up chest CT in 6 months. 6. Moderate fat containing infraumbilical ventral abdominal hernia. 7.  Aortic Atherosclerosis (ICD10-I70.0). Electronically Signed   By: Ilona Sorrel M.D.   On: 01/28/2018 02:54   Dg Abd 1 View  Result Date: 01/27/2018 CLINICAL DATA:  NG tube placement EXAM: ABDOMEN - 1 VIEW COMPARISON:  01/27/2018 FINDINGS: NG tube coils in the stomach with the tip in the fundus. Nonobstructive bowel gas pattern. Prior cholecystectomy. IMPRESSION: NG tube coils in the stomach with the tip in the fundus. Electronically Signed   By: Rolm Baptise M.D.   On: 01/27/2018 19:14   Dg Abd 1 View  Result Date: 01/27/2018 CLINICAL DATA:  NG tube placement EXAM: ABDOMEN - 1 VIEW COMPARISON:  01/27/2018 FINDINGS: NG tube is in the stomach. Nonobstructive bowel gas pattern. Visualized lung bases clear. Prior cholecystectomy. IMPRESSION: NG tube tip in the mid stomach. Electronically Signed   By:  Rolm Baptise M.D.   On: 01/27/2018 18:50   US Renal  Result Date: 01/27/2018 CLINICAL DATA:  Acute renal failure EXAM: RENAL / URINARY TRACT ULTRASOUND COMPLETE COMPARISON:  CT abdomen/pelvis dated 09/19/2011 FINDINGS: Right Kidney: Length: 10.5 cm. Echogenic renal parenchyma. 16 x 14 x 10 mm lower pole cyst. No hydronephrosis. Left Kidney: Length: 8.6 cm. Cortical scarring with lobular parenchyma. Echogenic renal parenchyma. Stable hydronephrosis versus extrarenal pelvis. Bladder: Within normal limits. IMPRESSION: Echogenic renal parenchyma, suggesting medical renal disease. Left renal cortical scarring with stable hydronephrosis versus extrarenal pelvis, chronic. This appearance is unchanged from 2013. 1.6 cm right lower pole renal cyst, simple. Electronically Signed   By: Julian Hy M.D.   On: 01/27/2018 12:41   Dg Chest Port 1 View  Result Date: 01/27/2018 CLINICAL DATA:  Fall this morning. Patient reports being diagnosed with a kidney infection last week. Vomiting. History of diabetes and hypertension. EXAM: PORTABLE CHEST 1 VIEW COMPARISON:  05/17/2006 radiographs.  CT neck 05/28/2017 FINDINGS: 0845 hours. The heart size is at the upper limits of normal for AP technique. There is aortic atherosclerosis. There is a left upper lobe lung nodule measuring approximately 14 mm (not corrected for magnification), unchanged from previous neck CT. The lungs are otherwise clear. There is no pleural effusion or pneumothorax. No acute osseous findings are evident. IMPRESSION: 1. No acute cardiopulmonary process. 2. Left upper lobe pulmonary nodule, which was enlarging on neck CT performed in January. This remains concerning for malignancy, and further evaluation recommended if not performed elsewhere. Electronically Signed   By: Richardean Sale M.D.   On: 01/27/2018 09:17   Dg Abd Portable 2 Views  Result Date: 01/27/2018 CLINICAL DATA:  Fall this morning, patient has been weak for the past couple weeks  and states that she was diagnosed with kidney infection a week ago and that pt has been having dark blood like emesis for the past couple days. Possible sepsis. Hx of diabetes, HTN, abdominal hysterectomy. EXAM: PORTABLE ABDOMEN - 2 VIEW COMPARISON:  CT, 09/19/2011 FINDINGS: Normal bowel gas pattern.  No free air. No evidence of renal or ureteral stones. Clips are upper quadrant reflect  a prior cholecystectomy. There are scattered vascular calcifications. No acute skeletal abnormality.  Lung bases are clear. IMPRESSION: 1. No acute findings.  No evidence bowel obstruction or free air. Electronically Signed   By: Lajean Manes M.D.   On: 01/27/2018 09:14   [place] Ekg Site Rite  Result Date: 01/28/2018 If Site Rite image not attached, placement could not be confirmed due to current cardiac rhythm.     ECHO      The patient is: _0  acutely ill Risk of deterioration: _1  moderate   _2  critically ill  _3  high   _4 See my orders for details  My assessment, plan of care, findings, medications, side effects etc were discussed with: _5 nursing _6 PT/OT   _7 respiratory therapy _8 Dr.  Burnard Leigh family _10    Lahoma Rocker, M.D. Pulmonary Critical Care & Sleep Medicine  _11 @ 01/28/2018

## 2018-01-28 NOTE — Transfer of Care (Signed)
Immediate Anesthesia Transfer of Care Note  Patient: Charlann LangeBarbara Lasorsa  Procedure(s) Performed: CYSTOSCOPY WITH STENT PLACEMENT (Bilateral Ureter)  Patient Location: PACU  Anesthesia Type:General  Level of Consciousness: sedated and patient cooperative  Airway & Oxygen Therapy: Patient Spontanous Breathing and Patient connected to face mask oxygen  Post-op Assessment: Report given to RN and Post -op Vital signs reviewed and stable  Post vital signs: Reviewed and stable  Last Vitals:  Vitals Value Taken Time  BP 114/78 01/28/2018  6:59 PM  Temp    Pulse 85 01/28/2018  7:03 PM  Resp 18 01/28/2018  7:03 PM  SpO2 100 % 01/28/2018  7:03 PM  Vitals shown include unvalidated device data.  Last Pain:  Vitals:   01/28/18 0800  TempSrc: Axillary  PainSc: 0-No pain      Patients Stated Pain Goal: 0 (01/28/18 0800)  Complications: No apparent anesthesia complications

## 2018-01-28 NOTE — Progress Notes (Signed)
Called and spoke with Luci Bankukov, NP and made her aware that RN attempted to give patient 2nd dose of 60 meq of potassium down NG tube and that after first packet of 20 meq was given that patient vomited therefore RN stopped and hooked NG tube back to low intermittent suction. NP stated she would order IV potassium replacement.

## 2018-01-28 NOTE — Anesthesia Preprocedure Evaluation (Signed)
Anesthesia Evaluation  Patient identified by MRN, date of birth, ID band Patient awake    Reviewed: Allergy & Precautions, H&P , NPO status , Patient's Chart, lab work & pertinent test results, reviewed documented beta blocker date and time   Airway Mallampati: II  TM Distance: >3 FB Neck ROM: full    Dental  (+) Teeth Intact   Pulmonary neg pulmonary ROS,    Pulmonary exam normal        Cardiovascular Exercise Tolerance: Good hypertension, On Medications negative cardio ROS Normal cardiovascular exam Rate:Normal     Neuro/Psych negative neurological ROS  negative psych ROS   GI/Hepatic negative GI ROS, Neg liver ROS,   Endo/Other  negative endocrine ROSdiabetes  Renal/GU Renal diseasenegative Renal ROS  negative genitourinary   Musculoskeletal   Abdominal   Peds  Hematology negative hematology ROS (+)   Anesthesia Other Findings   Reproductive/Obstetrics negative OB ROS                             Anesthesia Physical Anesthesia Plan  ASA: III and emergent  Anesthesia Plan: General LMA   Post-op Pain Management:    Induction:   PONV Risk Score and Plan: 4 or greater  Airway Management Planned:   Additional Equipment:   Intra-op Plan:   Post-operative Plan:   Informed Consent: I have reviewed the patients History and Physical, chart, labs and discussed the procedure including the risks, benefits and alternatives for the proposed anesthesia with the patient or authorized representative who has indicated his/her understanding and acceptance.     Plan Discussed with: CRNA  Anesthesia Plan Comments:         Anesthesia Quick Evaluation

## 2018-01-28 NOTE — Consult Note (Signed)
Pharmacy Antibiotic Note  Tiffany Manning is a 77 y.o. female admitted on 01/27/2018 with sepsis secondary to UTI.  Pharmacy has been consulted for Cefepime dosing. Patient received vancomycin 1g IV and Cefepime 2gm IV x 1 dose in ED  Plan: Start Cefepime 1g IV every 24 hours based on CrCl 11-29 ml/min.   Vancomycin DW 57kg  Vd 40L kei 0.016 hr-1  T1/2 43 hours. Vancomycin 750 mg q 48 hours ordered with stacked dosing. Level before 3rd dose. Goal trough 15-20  Height: 5' (152.4 cm) Weight: 126 lb 8.7 oz (57.4 kg) IBW/kg (Calculated) : 45.5  Temp (24hrs), Avg:97.5 F (36.4 C), Min:96.5 F (35.8 C), Max:98.9 F (37.2 C)  Recent Labs  Lab 01/27/18 0845  01/27/18 1150 01/27/18 1547 01/27/18 1655 01/27/18 1905 01/27/18 2249 01/28/18 0257  WBC 22.3*  --   --   --   --   --   --  14.1*  CREATININE 3.40*   < > 3.01* 2.85*  --  2.60* 2.46* 2.62*  LATICACIDVEN 2.9*  --  1.9  --  1.9 2.0* 2.4*  --    < > = values in this interval not displayed.    Estimated Creatinine Clearance: 14.1 mL/min (A) (by C-G formula based on SCr of 2.62 mg/dL (H)).    Allergies  Allergen Reactions  . Sulfa Antibiotics Nausea And Vomiting    Antimicrobials this admission: 9/22 Vancomycin >>  9/22 Cefepime >>   Microbiology results: 9/22 BCx: pending 9/22 UCx: pending  Thank you for allowing pharmacy to be a part of this patient's care.  Erich MontaneMcBane,Jahrel Borthwick S, PharmD, BCPS Clinical Pharmacist 01/28/2018 6:29 AM

## 2018-01-28 NOTE — Progress Notes (Signed)
Spoke with Luci Bankukov, NP and made her aware of critical potassium of 2.2 and that this was drawn prior to potassium being given down NG tube due to patient's nausea.  NP stated she would order potassium in IVF now that bicarb drip is done.  RN asked about giving IV potassium runs and NP stated that she wants her to have it PO since she is tolerating so far.

## 2018-01-28 NOTE — Progress Notes (Signed)
PHARMACY - PHYSICIAN COMMUNICATION CRITICAL VALUE ALERT - BLOOD CULTURE IDENTIFICATION (BCID)  Charlann LangeBarbara Hechavarria is an 77 y.o. female who [facility] with a chief complaint of   Assessment:  1/4 Staph spp mecA(+) (include suspected source if known)  Name of physician (or Provider) Contacted: Tukov  Current antibiotics: cefepime  Changes to prescribed antibiotics recommended:  Restarted vancomycin  Results for orders placed or performed during the hospital encounter of 01/27/18  Blood Culture ID Panel (Reflexed) (Collected: 01/27/2018  8:40 AM)  Result Value Ref Range   Enterococcus species NOT DETECTED NOT DETECTED   Listeria monocytogenes NOT DETECTED NOT DETECTED   Staphylococcus species DETECTED (A) NOT DETECTED   Staphylococcus aureus NOT DETECTED NOT DETECTED   Methicillin resistance DETECTED (A) NOT DETECTED   Streptococcus species NOT DETECTED NOT DETECTED   Streptococcus agalactiae NOT DETECTED NOT DETECTED   Streptococcus pneumoniae NOT DETECTED NOT DETECTED   Streptococcus pyogenes NOT DETECTED NOT DETECTED   Acinetobacter baumannii NOT DETECTED NOT DETECTED   Enterobacteriaceae species NOT DETECTED NOT DETECTED   Enterobacter cloacae complex NOT DETECTED NOT DETECTED   Escherichia coli NOT DETECTED NOT DETECTED   Klebsiella oxytoca NOT DETECTED NOT DETECTED   Klebsiella pneumoniae NOT DETECTED NOT DETECTED   Proteus species NOT DETECTED NOT DETECTED   Serratia marcescens NOT DETECTED NOT DETECTED   Haemophilus influenzae NOT DETECTED NOT DETECTED   Neisseria meningitidis NOT DETECTED NOT DETECTED   Pseudomonas aeruginosa NOT DETECTED NOT DETECTED   Candida albicans NOT DETECTED NOT DETECTED   Candida glabrata NOT DETECTED NOT DETECTED   Candida krusei NOT DETECTED NOT DETECTED   Candida parapsilosis NOT DETECTED NOT DETECTED   Candida tropicalis NOT DETECTED NOT DETECTED    Tanny Harnack S 01/28/2018  6:51 AM

## 2018-01-28 NOTE — Anesthesia Post-op Follow-up Note (Signed)
Anesthesia QCDR form completed.        

## 2018-01-28 NOTE — Progress Notes (Signed)
Dr. Annabell HowellsWrenn came and spoke with husband regarding cystoscopy procedure. Family at bedside. Consent obtained. Patient currently off of the unit for cystoscopy procedure. Insulin drip running when patient left, OR RNs aware of last CBG. Family went to OR waiting room.

## 2018-01-28 NOTE — Progress Notes (Signed)
RN made Tiffany Harmanana, NP aware of blood glucose of 61.  NP stated she would order d50.

## 2018-01-28 NOTE — Progress Notes (Signed)
Awaiting clearance from infectious disease and nephrology to place PICC.  Gasper LloydKerry Danielys Madry, RN VAST

## 2018-01-28 NOTE — Op Note (Signed)
Procedure: Cystoscopy with insertion of bilateral double-J stents.  Preop diagnosis: Bilateral hydronephrosis with probable left pyonephrosis.  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  Surgeon: Dr. Irine Seal.  Anesthesia: MAC.  Specimen: Urine cultures from right and left renal pelves.  Drains: Bilateral 6 French by 24 cm contour double-J stent's and 16 French Foley catheter.  EBL: None.  Complications: None.  Indications: Tiffany Manning is a 77 year old white female with chronic urinary tract infections who was admitted with nausea vomiting and weakness and was found to have a probable urinary tract infection with bilateral hydronephrosis to the bladder and possible left pyelonephrosis.  It was felt that cystoscopy and stenting was indicated.  Procedure: She had been on antibiotics per the intensive care team and infectious disease.  She was taken the operating room where she was placed on the table in the lithotomy position and sedation was given as indicated.  Her perineum and genitalia were prepped with Betadine solution and she was draped in usual sterile fashion.  Cystoscopy was performed using the 23 [place] scope and a 30 degree lens.  Examination revealed a normal urethra.  The initial urine was quite turbid and the bladder was irrigated out.  Inspection of the bladder revealed a very small capacity bladder with erythematous mucosa on the posterior wall and severe keratinization of the right and left lateral walls which was very worrisome for underlying squamous cell carcinoma.  The ureteral orifice ease were not clearly apparent initially.  After the initial inspection of 5 French opening catheter was passed through the cystoscope and the left trigone was probed for the ureteral orifice which was identified somewhat lateral on the trigone.  I was able to pass the opening  catheter with the aid of a guidewire through the orifice to the kidney without difficulty.  Upon reaching the kidney the guidewire was removed and there was a brisk reflux of frankly purulent fluid.  A syringe was attached and approximately 30 mL of purulent fluid was aspirated.  A specimen was collected for culture.  After completion of the aspiration a guidewire was passed back to the kidney and the opening catheter was removed.  A 6 French by 24 cm contour double-J stent was then passed over the wire to the kidney under fluoroscopic guidance without difficulty.  The wire was removed leaving a good coil in the kidney and a good coil in the bladder.  A fresh 5 French opening catheter was then used to probe for the right ureteral orifice which was also lateral on the trigone and was somewhat obscured by adjacent keratinization.  However I was able to get the ureteral catheter to pass and then with the aid of the sensor wire advance it to the kidney.  The wire was removed and once again there was a brisk drip however it was not nearly as purulent and a specimen was collected for culture.  The sensor wire was then replaced and the opening catheter removed.  A second 6 [place] by 24 cm contour double-J stent was inserted over the wire to the kidney under fluoroscopic guidance.  The wire was removed leaving good coil in the kidney and a good coil in the bladder.  The cystoscope was removed and a 16 French Foley catheter was inserted.  The bladder capacity was small but I inflated the balloon with 8 mL of sterile fluid.  The catheter was placed to straight drainage.  She was taken down from lithotomy position, her anesthetic was reversed and she was moved to recovery room in stable condition.  There were no complications.

## 2018-01-28 NOTE — Consult Note (Signed)
Date: 01/28/2018                  Patient Name:  Tiffany Manning  MRN: [id number]  DOB: 07/05/1940  Age / Sex: 77 y.o., female         PCP: Jaclyn Shaggy, MD                 Service Requesting Consult: IM/ Houston Siren, MD                 Reason for Consult: ARF            History of Present Illness: Patient is a 77 y.o. female with medical problems of Diabetes, HTN, HLD, salivary gland stones who was admitted to [facility] on 01/27/2018 for evaluation of generalized fatigue.  She is diagnosed with sepsis from UTI. She was getting treatment for UTI as outpatient. Had similar illness earlier in year and was hospitalized at [facility] Found to have b/l hydronephrosis, no stone by CT Baseline Cr 1.38/ GFR 36 from jan 2019 Admit Cr 3.40 Currently critically ill, receiving volume resucitation   Medications: Outpatient medications: Medications Prior to Admission  Medication Sig Dispense Refill Last Dose  . atenolol (TENORMIN) 50 MG tablet Take 50 mg by mouth 2 (two) times daily.   01/26/2018 at 2000  . glipiZIDE-metformin (METAGLIP) 5-500 MG tablet Take 1 tablet by mouth 2 (two) times daily.   01/26/2018 at pm  . nitrofurantoin, macrocrystal-monohydrate, (MACROBID) 100 MG capsule Take 100 mg by mouth 2 (two) times daily. For 7 days   01/26/2018 at pm  . simvastatin (ZOCOR) 40 MG tablet Take 40 mg by mouth at bedtime.    01/26/2018 at pm  . triamterene-hydrochlorothiazide (MAXZIDE-25) 37.5-25 MG tablet Take 1 tablet by mouth 2 (two) times daily.   01/26/2018 at pm  . Multiple Vitamin (MULTIVITAMIN WITH MINERALS) TABS tablet Take 1 tablet by mouth daily. (Patient not taking: Reported on 01/27/2018)   Not Taking at Unknown time    Current medications: Current Facility-Administered Medications  Medication Dose Route Frequency Provider Last Rate Last Dose  . acetaminophen (TYLENOL) tablet 650 mg  650 mg Oral Q6H PRN Houston Siren, MD       Or  . acetaminophen (TYLENOL) suppository 650 mg  650 mg  Rectal Q6H PRN Houston Siren, MD      . ceFEPIme (MAXIPIME) 1 g in sodium chloride 0.9 % 100 mL IVPB  1 g Intravenous Q24H Hallaji, Sheema M, RPH 200 mL/hr at 01/28/18 0759 1 g at 01/28/18 0759  . chlorhexidine (PERIDEX) 0.12 % solution 15 mL  15 mL Mouth Rinse BID Tukov-Yual, Magdalene S, NP   15 mL at 01/28/18 1032  . dextrose 5 % and 0.45 % NaCl with KCl 40 mEq/L infusion   Intravenous Continuous Tukov-Yual, Magdalene S, NP 100 mL/hr at 01/28/18 0530    . famotidine (PEPCID) IVPB 20 mg premix  20 mg Intravenous Q12H Arbie Cookey, MD   Stopped at 01/28/18 0232  . heparin injection 5,000 Units  5,000 Units Subcutaneous Q8H Houston Siren, MD   5,000 Units at 01/28/18 0538  . insulin regular (NOVOLIN R,HUMULIN R) 100 Units in sodium chloride 0.9 % 100 mL (1 Units/mL) infusion   Intravenous Continuous Houston Siren, MD 0.5 mL/hr at 01/28/18 1009 0.5 Units/hr at 01/28/18 1009  . lactated ringers bolus 1,000 mL  1,000 mL Intravenous Once Roseanne Reno, MD 500 mL/hr at 01/28/18 1015 1,000 mL at 01/28/18  1015  . MEDLINE mouth rinse  15 mL Mouth Rinse q12n4p Tukov-Yual, Magdalene S, NP      . metoCLOPramide (REGLAN) injection 5 mg  5 mg Intravenous Q6H Tukov-Yual, Magdalene S, NP   5 mg at 01/28/18 0537  . potassium chloride 10 mEq in 100 mL IVPB  10 mEq Intravenous Q1 Hr x 4 Tukov-Yual, Magdalene S, NP 100 mL/hr at 01/28/18 1115 10 mEq at 01/28/18 1115  . potassium PHOSPHATE 20 mmol in dextrose 5 % 500 mL infusion  20 mmol Intravenous Once Roseanne RenoShah, Rutul, MD 84 mL/hr at 01/28/18 1020 20 mmol at 01/28/18 1020  . promethazine (PHENERGAN) injection 12.5 mg  12.5 mg Intravenous Q6H PRN Tukov-Yual, Magdalene S, NP   12.5 mg at 01/28/18 1041  . sodium chloride 0.9 % bolus 750 mL  750 mL Intravenous Once Sharyn CreamerQuale, Mark, MD      . vancomycin (VANCOCIN) IVPB 750 mg/150 ml premix  750 mg Intravenous Q48H Tukov-Yual, Magdalene S, NP          Allergies: Allergies  Allergen Reactions  . Sulfa Antibiotics Nausea  And Vomiting      Past Medical History: Past Medical History:  Diagnosis Date  . Diabetes mellitus without complication (HCC)   . Hyperlipidemia   . Hypertension      Past Surgical History: Past Surgical History:  Procedure Laterality Date  . ABDOMINAL HYSTERECTOMY       Family History: Family History  Problem Relation Age of Onset  . Diabetes Sister      Social History: Social History   Socioeconomic History  . Marital status: Married    Spouse name: Not on file  . Number of children: Not on file  . Years of education: Not on file  . Highest education level: Not on file  Occupational History  . Not on file  Social Needs  . Financial resource strain: Not hard at all  . Food insecurity:    Worry: Never true    Inability: Never true  . Transportation needs:    Medical: No    Non-medical: No  Tobacco Use  . Smoking status: Never Smoker  . Smokeless tobacco: Never Used  Substance and Sexual Activity  . Alcohol use: No    Frequency: Never  . Drug use: No  . Sexual activity: Not Currently  Lifestyle  . Physical activity:    Days per week: 0 days    Minutes per session: 0 min  . Stress: Not at all  Relationships  . Social connections:    Talks on phone: Twice a week    Gets together: Twice a week    Attends religious service: More than 4 times per year    Active member of club or organization: Yes    Attends meetings of clubs or organizations: 1 to 4 times per year    Relationship status: Married  . Intimate partner violence:    Fear of current or ex partner: No    Emotionally abused: No    Physically abused: No    Forced sexual activity: No  Other Topics Concern  . Not on file  Social History Narrative   Lives with husband at home     Review of Systems: n/a due to critical illness Gen:  HEENT:  CV:  Resp:  GI: GU :  MS:  Derm:   Psych: Heme:  Neuro:  Endocrine  Vital Signs: Blood pressure 111/76, pulse (!) 104, temperature 98.1  F (36.7 C), temperature source  Axillary, resp. rate (!) 24, height 5' (1.524 m), weight 57.4 kg, SpO2 100 %.   Intake/Output Summary (Last 24 hours) at 01/28/2018 1134 Last data filed at 01/28/2018 0800 Gross per 24 hour  Intake 3631.3 ml  Output 1010 ml  Net 2621.3 ml    Weight trends: [employer]   01/27/18 0840 01/27/18 1122  Weight: 54.4 kg 57.4 kg    Physical Exam: General:  Ill appearing, laying in bed  HEENT Dry oral mucus membranes, anicteric, NG tube  Neck:  no JVD, supple  Lungs: Coarse b/l  Heart::  tachycardic, irregular  Abdomen: Soft, non distended  Extremities:  no peripheral edema  Neurologic: Lethargic, arousable, able to follow commands  Skin: Decreased turgor, no acute rashes     Foley: In place       Lab results: Basic Metabolic Panel: Recent Labs  Lab 01/27/18 1905 01/27/18 2249 01/28/18 0257 01/28/18 0839  NA 140 146* 146* 148*  K 3.2* 2.8* 2.2* 3.2*  CL 117* 116* 110 114*  CO2 <7* 17* 17* 20*  GLUCOSE 203* 172* 205* 159*  BUN 83* 83* 75* 71*  CREATININE 2.60* 2.46* 2.62* 2.44*  CALCIUM 8.0* 7.9* 7.8* 8.0*  MG 2.1  --   --  2.0  PHOS 4.7*  --   --  1.6*    Liver Function Tests: Recent Labs  Lab 01/27/18 0845  AST 21  ALT 23  ALKPHOS 104  BILITOT 1.0  PROT 7.9  ALBUMIN 3.8   Recent Labs  Lab 01/27/18 0845  LIPASE 148*   No results for input(s): AMMONIA in the last 168 hours.  CBC: Recent Labs  Lab 01/27/18 0845 01/27/18 1905 01/28/18 0257  WBC 22.3*  --  14.1*  NEUTROABS 19.5*  --   --   HGB 11.7* 10.2* 9.8*  HCT 37.2 32.0* 30.2*  MCV 97.1  --  92.5  PLT 491*  --  327    Cardiac Enzymes: Recent Labs  Lab 01/27/18 1004  CKTOTAL 49    BNP: Invalid input(s): POCBNP  CBG: Recent Labs  Lab 01/28/18 0532 01/28/18 0646 01/28/18 0747 01/28/18 0852 01/28/18 0952  GLUCAP 142* 129* 117* 142* 172*    Microbiology: Recent Results (from the past 720 hour(s))  Blood Culture (routine x 2)     Status:  None (Preliminary result)   Collection Time: 01/27/18  8:40 AM  Result Value Ref Range Status   Specimen Description BLOOD RIGHT ANTECUBITAL  Final   Special Requests   Final    BOTTLES DRAWN AEROBIC AND ANAEROBIC Blood Culture adequate volume   Culture  Setup Time   Final    Organism ID to follow AEROBIC BOTTLE ONLY GRAM POSITIVE COCCI CRITICAL RESULT CALLED TO, READ BACK BY AND VERIFIED WITH: MATT [facility] 01/28/18 0618 REC Performed at [facility], [address] [place], [place] [address]    Culture GRAM POSITIVE COCCI  Final   Report Status PENDING  Incomplete  Blood Culture ID Panel (Reflexed)     Status: Abnormal   Collection Time: 01/27/18  8:40 AM  Result Value Ref Range Status   Enterococcus species NOT DETECTED NOT DETECTED Final   Listeria monocytogenes NOT DETECTED NOT DETECTED Final   Staphylococcus species DETECTED (A) NOT DETECTED Final    Comment: Methicillin (oxacillin) resistant coagulase negative staphylococcus. Possible blood culture contaminant (unless isolated from more than one blood culture draw or clinical case suggests pathogenicity). No antibiotic treatment is indicated for blood  culture contaminants. CRITICAL  RESULT CALLED TO, READ BACK BY AND VERIFIED WITH: MATT MCBANE 01/28/18 0618 REC    Staphylococcus aureus NOT DETECTED NOT DETECTED Final   Methicillin resistance DETECTED (A) NOT DETECTED Final    Comment: CRITICAL RESULT CALLED TO, READ BACK BY AND VERIFIED WITH: MATT MCBANE 01/28/18 0618 REC    Streptococcus species NOT DETECTED NOT DETECTED Final   Streptococcus agalactiae NOT DETECTED NOT DETECTED Final   Streptococcus pneumoniae NOT DETECTED NOT DETECTED Final   Streptococcus pyogenes NOT DETECTED NOT DETECTED Final   Acinetobacter baumannii NOT DETECTED NOT DETECTED Final   Enterobacteriaceae species NOT DETECTED NOT DETECTED Final   Enterobacter cloacae complex NOT DETECTED NOT DETECTED Final   Escherichia coli NOT DETECTED NOT  DETECTED Final   Klebsiella oxytoca NOT DETECTED NOT DETECTED Final   Klebsiella pneumoniae NOT DETECTED NOT DETECTED Final   Proteus species NOT DETECTED NOT DETECTED Final   Serratia marcescens NOT DETECTED NOT DETECTED Final   Haemophilus influenzae NOT DETECTED NOT DETECTED Final   Neisseria meningitidis NOT DETECTED NOT DETECTED Final   Pseudomonas aeruginosa NOT DETECTED NOT DETECTED Final   Candida albicans NOT DETECTED NOT DETECTED Final   Candida glabrata NOT DETECTED NOT DETECTED Final   Candida krusei NOT DETECTED NOT DETECTED Final   Candida parapsilosis NOT DETECTED NOT DETECTED Final   Candida tropicalis NOT DETECTED NOT DETECTED Final    Comment: Performed at [facility], [address] [place], [place] [address]  Blood Culture (routine x 2)     Status: None (Preliminary result)   Collection Time: 01/27/18  8:46 AM  Result Value Ref Range Status   Specimen Description BLOOD LEFT ANTECUBITAL  Final   Special Requests   Final    BOTTLES DRAWN AEROBIC AND ANAEROBIC Blood Culture adequate volume   Culture   Final    NO GROWTH < 24 HOURS Performed at [facility], [address] [place], [place] [address]    Report Status PENDING  Incomplete  MRSA PCR Screening     Status: None   Collection Time: 01/27/18 11:25 AM  Result Value Ref Range Status   MRSA by PCR NEGATIVE NEGATIVE Final    Comment:        The GeneXpert MRSA Assay (FDA approved for NASAL specimens only), is one component of a comprehensive MRSA colonization surveillance program. It is not intended to diagnose MRSA infection nor to guide or monitor treatment for MRSA infections. Performed at [facility], [address] [place], [place] [address]      Coagulation Studies: Recent Labs    01/27/18 0845  LABPROT 14.9  INR 1.18    Urinalysis: Recent Labs    01/27/18 0945  COLORURINE YELLOW*  LABSPEC 1.026  PHURINE 7.0  GLUCOSEU NEGATIVE  HGBUR SMALL*   BILIRUBINUR NEGATIVE  KETONESUR 5*  PROTEINUR 30*  NITRITE NEGATIVE  LEUKOCYTESUR SMALL*        Imaging: Ct Abdomen Pelvis Wo Contrast  Result Date: 01/28/2018 CLINICAL DATA:  Inpatient. Elevated lipase. Severe nausea and vomiting. Abdominal pain. Prior hysterectomy. EXAM: CT ABDOMEN AND PELVIS WITHOUT CONTRAST TECHNIQUE: Multidetector CT imaging of the abdomen and pelvis was performed following the standard protocol without IV contrast. COMPARISON:  01/27/2018 abdominal radiograph. 09/19/2011 CT abdomen/pelvis. FINDINGS: Lower chest: Right middle lobe 7 mm solid pulmonary nodule (series 3/image 6), increased from 5 mm in 2013 CT. Coronary atherosclerosis. Hepatobiliary: Normal liver size. No liver mass. Cholecystectomy. No biliary ductal dilatation. CBD diameter 5 mm. Pancreas: Stable coarse  uncinate process pancreatic calcification, nonspecific, possibly vascular. No pancreatic mass or duct dilation. No significant peripancreatic fat stranding or fluid. Spleen: Normal size spleen. Bandlike low-attenuation focus in the lower spleen (series 2/image 20), new. Adrenals/Urinary Tract: Normal adrenals. Chronic severe asymmetric atrophy of the left kidney. Moderate bilateral hydroureteronephrosis to the level of the ureterovesical junction bilaterally. Hyperdense 1.1 cm anterior upper right renal cortical lesion, stable in size since 2013 although with new hyperdensity. Exophytic simple 1.1 cm posterior lower left renal cyst. No additional contour deforming renal lesions. No urolithiasis. Bladder is partially collapsed by indwelling Foley catheter with nondependent bladder gas compatible with instrumentation. No definite bladder wall thickening. Stomach/Bowel: Enteric tube terminates in the distal stomach. Collapsed and grossly normal stomach. Normal caliber small bowel with no small bowel wall thickening. Normal appendix. Normal large bowel with no diverticulosis, large bowel wall thickening or  pericolonic fat stranding. Vascular/Lymphatic: Atherosclerotic nonaneurysmal abdominal aorta. No pathologically enlarged lymph nodes in the abdomen or pelvis. Reproductive: Status post hysterectomy, with no abnormal findings at the vaginal cuff. No adnexal mass. Other: No pneumoperitoneum, ascites or focal fluid collection. Moderate fat containing infraumbilical ventral abdominal hernia. Musculoskeletal: No aggressive appearing focal osseous lesions. Mild thoracolumbar spondylosis. Scattered subcentimeter sclerotic osseous lesions in sacrum and bilateral pelvic girdle are similar to prior CT, probably benign bone islands. IMPRESSION: 1. Moderate bilateral hydroureteronephrosis to the level of the UVJ bilaterally, despite the presence of an indwelling Foley catheter, which only partially collapses the bladder. No evidence of obstructing urolithiasis or mass on this noncontrast scan. 2. No convincing noncontrast CT findings of acute pancreatitis. 3. Bandlike low-attenuation focus in the inferior spleen, poorly characterized on this noncontrast scan, new since 2013 CT. Cannot exclude splenic infarct or mass. Suggest attention on follow-up MRI or CT abdomen in 3-6 months. 4. Enteric tube terminates in the distal stomach. No evidence of bowel obstruction or acute bowel inflammation. 5. Right middle lobe 7 mm solid pulmonary nodule, mildly increased in size since 2013 CT. This nodule remains below PET resolution. Suggest follow-up chest CT in 6 months. 6. Moderate fat containing infraumbilical ventral abdominal hernia. 7.  Aortic Atherosclerosis (ICD10-I70.0). Electronically Signed   By: Delbert Phenix M.D.   On: 01/28/2018 02:54   Dg Abd 1 View  Result Date: 01/27/2018 CLINICAL DATA:  NG tube placement EXAM: ABDOMEN - 1 VIEW COMPARISON:  01/27/2018 FINDINGS: NG tube coils in the stomach with the tip in the fundus. Nonobstructive bowel gas pattern. Prior cholecystectomy. IMPRESSION: NG tube coils in the stomach with the  tip in the fundus. Electronically Signed   By: Charlett Nose M.D.   On: 01/27/2018 19:14   Dg Abd 1 View  Result Date: 01/27/2018 CLINICAL DATA:  NG tube placement EXAM: ABDOMEN - 1 VIEW COMPARISON:  01/27/2018 FINDINGS: NG tube is in the stomach. Nonobstructive bowel gas pattern. Visualized lung bases clear. Prior cholecystectomy. IMPRESSION: NG tube tip in the mid stomach. Electronically Signed   By: Charlett Nose M.D.   On: 01/27/2018 18:50   US Renal  Result Date: 01/27/2018 CLINICAL DATA:  Acute renal failure EXAM: RENAL / URINARY TRACT ULTRASOUND COMPLETE COMPARISON:  CT abdomen/pelvis dated 09/19/2011 FINDINGS: Right Kidney: Length: 10.5 cm. Echogenic renal parenchyma. 16 x 14 x 10 mm lower pole cyst. No hydronephrosis. Left Kidney: Length: 8.6 cm. Cortical scarring with lobular parenchyma. Echogenic renal parenchyma. Stable hydronephrosis versus extrarenal pelvis. Bladder: Within normal limits. IMPRESSION: Echogenic renal parenchyma, suggesting medical renal disease. Left renal cortical scarring with stable hydronephrosis  versus extrarenal pelvis, chronic. This appearance is unchanged from 2013. 1.6 cm right lower pole renal cyst, simple. Electronically Signed   By: Charline Bills M.D.   On: 01/27/2018 12:41   Dg Chest Port 1 View  Result Date: 01/27/2018 CLINICAL DATA:  Fall this morning. Patient reports being diagnosed with a kidney infection last week. Vomiting. History of diabetes and hypertension. EXAM: PORTABLE CHEST 1 VIEW COMPARISON:  05/17/2006 radiographs.  CT neck 05/28/2017 FINDINGS: 0845 hours. The heart size is at the upper limits of normal for AP technique. There is aortic atherosclerosis. There is a left upper lobe lung nodule measuring approximately 14 mm (not corrected for magnification), unchanged from previous neck CT. The lungs are otherwise clear. There is no pleural effusion or pneumothorax. No acute osseous findings are evident. IMPRESSION: 1. No acute cardiopulmonary  process. 2. Left upper lobe pulmonary nodule, which was enlarging on neck CT performed in January. This remains concerning for malignancy, and further evaluation recommended if not performed elsewhere. Electronically Signed   By: Carey Bullocks M.D.   On: 01/27/2018 09:17   Dg Abd Portable 2 Views  Result Date: 01/27/2018 CLINICAL DATA:  Fall this morning, patient has been weak for the past couple weeks and states that she was diagnosed with kidney infection a week ago and that pt has been having dark blood like emesis for the past couple days. Possible sepsis. Hx of diabetes, HTN, abdominal hysterectomy. EXAM: PORTABLE ABDOMEN - 2 VIEW COMPARISON:  CT, 09/19/2011 FINDINGS: Normal bowel gas pattern.  No free air. No evidence of renal or ureteral stones. Clips are upper quadrant reflect a prior cholecystectomy. There are scattered vascular calcifications. No acute skeletal abnormality.  Lung bases are clear. IMPRESSION: 1. No acute findings.  No evidence bowel obstruction or free air. Electronically Signed   By: Amie Portland M.D.   On: 01/27/2018 09:14   [place] Ekg Site Rite  Result Date: 01/28/2018 If Site Rite image not attached, placement could not be confirmed due to current cardiac rhythm.     Assessment & Plan: Pt is a 77 y.o. caucasian  female with medical problems of Diabetes, HTN, HLD, salivary gland stones who was admitted to [facility] on 01/27/2018 for evaluation of generalized fatigue and is Dx with severe sepsis, UTI and ARF.  1. ARF, multifactorial 2. B/l hydronephrosis 3. Sepsis, Gram positive bacteremia 4. Acidosis, Hypernatremia, Hypokalemia 5. 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 New onset A Fib 9. Enlarging LUL nodule  ARF on CKD st 3 is likely multifactorial from underlying sepsis and b/l hydronephrosis. Patient in non oliguric with UOP of 765 cc since admission Agree with volume resucitation Electrolyte correction as per ICU team No acute indication of HD at present Monitor  vanc levels in collaboration with pharmacist, avoid nephrotoxins, hypotension Will follow   LOS: 1 Amica Harron Thedore Mins 9/23/201911:34 AM  [facility] [place], [place] [phone]  Note: This note was prepared with Dragon dictation. Any transcription errors are unintentional

## 2018-01-28 NOTE — Progress Notes (Signed)
Pt s/p cystoscopy with insertion of bilateral double-J stents vital signs stable postop.  Pt lethargic however able to follow commands, denies pain, lungs clear throughout, and foley catheter draining cloudy yellow urine. Insulin gtt currently on hold due to hypoglycemia. BMP pending will continue to monitor and assess pt.  Sonda Rumbleana Morad Tal, AGNP  Pulmonary/Critical Care Pager [phone] (please enter 7 digits) PCCM Consult Pager [phone] (please enter 7 digits)

## 2018-01-28 NOTE — Progress Notes (Signed)
Blood glucose checked and was 61, notified Dana, NP and she ordered d50.  Dextrose given and rechecked and now blood glucose 111.

## 2018-01-28 NOTE — Consult Note (Signed)
WOC Nurse wound consult note Reason for Consult: LLE wound Patient is very drowsy and will not answer questions, does arouse but does not open her eyes. No family in the room Wound type: full thickness wound posterior left calf Pressure Injury POA: NA Measurement: 9cm x 5.5cm x 0.2cm  Wound bed: 90% pink 10% fibrinous material throughout the wound bed Drainage (amount, consistency, odor) scant, no odor Periwound: intact, no edema, no skin color changes, pulses palpable distally. Unable to ask patient about initial presentation of the wound.  Dressing procedure/placement/frequency: Xeroform gauze as non adherent with antiseptic quality. Dry dressing and kerlix. Change every other day.  Follow up with primary care or wound care center of the patient's choice at [facility].  Discussed POC with bedside nurse.  Re consult if needed, will not follow at this time. Thanks  Melessia Kaus M.D.C[employer] MSN, RN,CWOCN, CNS, CWON-AP [phone])

## 2018-01-28 NOTE — Consult Note (Signed)
Date of Admission:  01/27/2018                 Reason for Consult: sepsis   Referring Provider:sainani  No history from patient chart reviewed and spoke to husband and daughter  HPI: Tiffany LangeBarbara Manning is a 77 y.o. female with h/o DM, HTN, is admitted with severe weakness, poor appetite and near fall. As per her husband she has not been feeling well for more than 2 weeks. Has had no appetite, feeling very weak, passing dark urine, went to her PCP and urine checked and was given antibiotics, yesterday slid to the floor when she tried to get up and husband could not pick her up and called 911. She has been having black vomit for the past 2 days. In the ED Bp 90/45, temp 96.5, HR 130. Cultures sent and started on vanco and cefepime. Was found to have metabolic acidosis, AKI and hyperglycemia and DKA. As per the hospitalist her PCP had mentioned it was e.coli  Pretty sensitive organism She was admitted to ICU. Ct abdomen and pelvis revealed B/l hydroureteronephrosis and she has been seen by urologist and is going for stent placement. I am asked to see the patient for sepsis    Past Medical History:  Diagnosis Date  . Diabetes mellitus without complication (HCC)   . Hyperlipidemia   . Hypertension   sialedinitis  Past Surgical History:  Procedure Laterality Date  . ABDOMINAL HYSTERECTOMY      Social History   Tobacco Use  . Smoking status: Never Smoker  . Smokeless tobacco: Never Used  Substance Use Topics  . Alcohol use: No    Frequency: Never  . Drug use: No    Family History  Problem Relation Age of Onset  . Diabetes Sister      . chlorhexidine  15 mL Mouth Rinse BID  . heparin  5,000 Units Subcutaneous Q8H  . mouth rinse  15 mL Mouth Rinse q12n4p  . metoCLOPramide (REGLAN) injection  5 mg Intravenous Q6H      Abtx:  Anti-infectives (From admission, onward)   Start     Dose/Rate Route Frequency Ordered Stop   01/28/18 1800  vancomycin (VANCOCIN) IVPB 750 mg/150 ml  premix     750 mg 150 mL/hr over 60 Minutes Intravenous Every 48 hours 01/28/18 0628     01/28/18 0800  ceFEPIme (MAXIPIME) 1 g in sodium chloride 0.9 % 100 mL IVPB     1 g 200 mL/hr over 30 Minutes Intravenous Every 24 hours 01/27/18 1019     01/27/18 0930  vancomycin (VANCOCIN) IVPB 1000 mg/200 mL premix     1,000 mg 200 mL/hr over 60 Minutes Intravenous  Once 01/27/18 0923 01/27/18 1115   01/27/18 0845  ceFEPIme (MAXIPIME) 2 g in sodium chloride 0.9 % 100 mL IVPB     2 g 200 mL/hr over 30 Minutes Intravenous  Once 01/27/18 0842 01/27/18 0959       Review of Systems: NA from aptient  Allergies  Allergen Reactions  . Sulfa Antibiotics Nausea And Vomiting   Limited examination OBJECTIVE: Blood pressure 111/76, pulse (!) 104, temperature 98.1 F (36.7 C), temperature source Axillary, resp. rate (!) 24, height 5' (1.524 m), weight 57.4 kg, SpO2 100 %.  Physical Exam Constitutional: drowsy, pale, NG tube- dark fluid, on calling her name opens her eyes and able to respond appropriately  Eyes-Not examined ENT- did not examine CVS- s1s2 tachycardia RS b/l air entry decreased bases GI- abd  soft GU- foley in place MSK-moves all limbs SKIN- ulcer over the left leg laterally  Neurologic- did not examine in detail Psychiatric-cannot assess Lymphatic-  Lab Results CBC    Component Value Date/Time   WBC 14.1 (H) 01/28/2018 0257   RBC 3.26 (L) 01/28/2018 0257   HGB 9.8 (L) 01/28/2018 0257   HGB 8.6 (L) 10/10/2013 0451   HCT 30.2 (L) 01/28/2018 0257   HCT 26.2 (L) 10/10/2013 0451   PLT 327 01/28/2018 0257   PLT 294 10/10/2013 0451   MCV 92.5 01/28/2018 0257   MCV 88 10/10/2013 0451   MCH 30.1 01/28/2018 0257   MCHC 32.6 01/28/2018 0257   RDW 14.8 (H) 01/28/2018 0257   RDW 13.9 10/10/2013 0451   LYMPHSABS 1.7 01/27/2018 0845   LYMPHSABS 3.1 10/10/2013 0451   MONOABS 1.2 (H) 01/27/2018 0845   MONOABS 0.4 10/10/2013 0451   EOSABS 0.0 01/27/2018 0845   EOSABS 0.2  10/10/2013 0451   BASOSABS 0.0 01/27/2018 0845   BASOSABS 0.1 10/10/2013 0451   CBC Latest Ref Rng & Units 01/28/2018 01/27/2018 01/27/2018  WBC 3.6 - 11.0 K/uL 14.1(H) - 22.3(H)  Hemoglobin 12.0 - 16.0 g/dL 1.6(X) 10.2(L) 11.7(L)  Hematocrit 35.0 - 47.0 % 30.2(L) 32.0(L) 37.2  Platelets 150 - 440 K/uL 327 - 491(H)    CMP Latest Ref Rng & Units 01/28/2018 01/28/2018 01/28/2018  Glucose 70 - 99 mg/dL 096(E) 454(U) 981(X)  BUN 8 - 23 mg/dL 91(Y) 78(G) 95(A)  Creatinine 0.44 - 1.00 mg/dL 2.13(Y) 8.65(H) 8.46(N)  Sodium 135 - 145 mmol/L 146(H) 143 148(H)  Potassium 3.5 - 5.1 mmol/L 3.9 4.3 3.2(L)  Chloride 98 - 111 mmol/L 114(H) 115(H) 114(H)  CO2 22 - 32 mmol/L 19(L) 15(L) 20(L)  Calcium 8.9 - 10.3 mg/dL 7.8(L) 7.6(L) 8.0(L)  Total Protein 6.5 - 8.1 g/dL - - -  Total Bilirubin 0.3 - 1.2 mg/dL - - -  Alkaline Phos 38 - 126 U/L - - -  AST 15 - 41 U/L - - -  ALT 0 - 44 U/L - - -      Microbiology: 9/22 BC- 1 of 2 coag neg staph Urine culture  Radiographs and labs were personally reviewed by me.   Assessment and Plan 77 y.o. female with h/o DM, HTN, is admitted with severe weakness, poor appetite and near fall. As per her husband she has not been feeling well for more than 2 weeks. Has had no appetite, feeling very weak, passing dark urine, went to her PCP and urine checked and was given antibiotics, yesterday slid to the floor when she tried to get up and husband could not pick her up and called 911. She has been having black vomit for the past 2 days. In the ED Bp 90/45, temp 96.5, HR 130. Cultures sent and started on vanco and cefepime. Was found to have metabolic acidosis, AKI and hyperglycemia and DKA. As per the hospitalist her PCP had mentioned it was e.coli  Pretty sensitive organism She was admitted to ICU. Ct abdomen and pelvis revealed B/l hydroureteronephrosis and she has been seen by urologist and is going for stent placement.  Severe urosepsis - has b/l  hydroureteronephrosis- as per urologist she could have pyonephrosis left kidney- being taken for stent Continue cefepime also on vanco-   Coag neg staph in blood is likely a skin contaminant  AKI on ckd- combination of dehydration, sepsis and obstruction   DKA- being corrected  Hypokalemia  Being corrected Anemia  Upper GI  bleed-? Stress related- may need endoscopy  Non healing wound left leg x 4 yrs after a cat scratch- may need biopsy? Discussed the management with her family and urologist    Lynn Ito, MD  01/28/2018, 5:10 PM  Note:  This document was prepared using Dragon voice recognition software and may include unintentional dictation errors.

## 2018-01-28 NOTE — Consult Note (Signed)
Subjective: CC: Hydronephrosis.  Hx: I was asked to see Mr. Tiffany Manning by Dr. Sherryll Burger for bilateral hydro with sepsis.   She had the onset a few days ago of nausea and vomiting with progressive weakness and was admitted to the hospital last night.  She had a CT which showed bilateral hydro with left renal atrophy and some layered material suspicious for puralent debris.  The level of the obstruction was at the bladder but no stones were seen.  The bladder was collapsed around the foley.   She has a history of DM and has had chronic UTI's for many years.  A CT in 2013 showed the left hydro and it is minimally changed.   She is unable to respond to questioning but her husband reports that she has felt bad for years and has chronic abdominal pain.   She has been on multiple antibiotics over the years.   She has had no other GU history.   Her WBC was 22K on admission but is improving with therapy and her Cr is 2.46.  Her Cr was 1.05 in 2015.  The cultures are pending but the Culture ID panel was negative.    ROS:  Review of Systems  Unable to perform ROS: Medical condition  Constitutional: Positive for malaise/fatigue.  Gastrointestinal: Positive for abdominal pain, nausea and vomiting.    Allergies  Allergen Reactions  . Sulfa Antibiotics Nausea And Vomiting    Past Medical History:  Diagnosis Date  . Diabetes mellitus without complication (HCC)   . Hyperlipidemia   . Hypertension     Past Surgical History:  Procedure Laterality Date  . ABDOMINAL HYSTERECTOMY      Social History   Socioeconomic History  . Marital status: Married    Spouse name: Not on file  . Number of children: Not on file  . Years of education: Not on file  . Highest education level: Not on file  Occupational History  . Not on file  Social Needs  . Financial resource strain: Not hard at all  . Food insecurity:    Worry: Never true    Inability: Never true  . Transportation needs:    Medical: No    Non-medical:  No  Tobacco Use  . Smoking status: Never Smoker  . Smokeless tobacco: Never Used  Substance and Sexual Activity  . Alcohol use: No    Frequency: Never  . Drug use: No  . Sexual activity: Not Currently  Lifestyle  . Physical activity:    Days per week: 0 days    Minutes per session: 0 min  . Stress: Not at all  Relationships  . Social connections:    Talks on phone: Twice a week    Gets together: Twice a week    Attends religious service: More than 4 times per year    Active member of club or organization: Yes    Attends meetings of clubs or organizations: 1 to 4 times per year    Relationship status: Married  . Intimate partner violence:    Fear of current or ex partner: No    Emotionally abused: No    Physically abused: No    Forced sexual activity: No  Other Topics Concern  . Not on file  Social History Narrative   Lives with husband at home    Family History  Problem Relation Age of Onset  . Diabetes Sister     Anti-infectives: Anti-infectives (From admission, onward)   Start  Dose/Rate Route Frequency Ordered Stop   01/28/18 1800  vancomycin (VANCOCIN) IVPB 750 mg/150 ml premix     750 mg 150 mL/hr over 60 Minutes Intravenous Every 48 hours 01/28/18 0628     01/28/18 0800  ceFEPIme (MAXIPIME) 1 g in sodium chloride 0.9 % 100 mL IVPB     1 g 200 mL/hr over 30 Minutes Intravenous Every 24 hours 01/27/18 1019     01/27/18 0930  vancomycin (VANCOCIN) IVPB 1000 mg/200 mL premix     1,000 mg 200 mL/hr over 60 Minutes Intravenous  Once 01/27/18 0923 01/27/18 1115   01/27/18 0845  ceFEPIme (MAXIPIME) 2 g in sodium chloride 0.9 % 100 mL IVPB     2 g 200 mL/hr over 30 Minutes Intravenous  Once 01/27/18 9629 01/27/18 0959      Current Facility-Administered Medications  Medication Dose Route Frequency Provider Last Rate Last Dose  . acetaminophen (TYLENOL) tablet 650 mg  650 mg Oral Q6H PRN Houston Siren, MD       Or  . acetaminophen (TYLENOL) suppository 650  mg  650 mg Rectal Q6H PRN Houston Siren, MD      . ceFEPIme (MAXIPIME) 1 g in sodium chloride 0.9 % 100 mL IVPB  1 g Intravenous Q24H Hallaji, Sheema M, RPH 200 mL/hr at 01/28/18 0759 1 g at 01/28/18 0759  . chlorhexidine (PERIDEX) 0.12 % solution 15 mL  15 mL Mouth Rinse BID Tukov-Yual, Magdalene S, NP   15 mL at 01/28/18 1032  . dextrose 5 % and 0.45 % NaCl with KCl 40 mEq/L infusion   Intravenous Continuous Tukov-Yual, Magdalene S, NP 100 mL/hr at 01/28/18 1632 100 mL/hr at 01/28/18 1632  . famotidine (PEPCID) IVPB 20 mg premix  20 mg Intravenous Q12H Arbie Cookey, MD 100 mL/hr at 01/28/18 1305 20 mg at 01/28/18 1305  . heparin injection 5,000 Units  5,000 Units Subcutaneous Q8H Houston Siren, MD   5,000 Units at 01/28/18 1310  . insulin regular (NOVOLIN R,HUMULIN R) 100 Units in sodium chloride 0.9 % 100 mL (1 Units/mL) infusion   Intravenous Continuous Houston Siren, MD 5.5 mL/hr at 01/28/18 1607 5.5 Units/hr at 01/28/18 1607  . MEDLINE mouth rinse  15 mL Mouth Rinse q12n4p Tukov-Yual, Magdalene S, NP   15 mL at 01/28/18 1607  . metoCLOPramide (REGLAN) injection 5 mg  5 mg Intravenous Q6H Tukov-Yual, Magdalene S, NP   5 mg at 01/28/18 1256  . promethazine (PHENERGAN) injection 12.5 mg  12.5 mg Intravenous Q6H PRN Tukov-Yual, Magdalene S, NP   12.5 mg at 01/28/18 1041  . sodium chloride 0.9 % bolus 750 mL  750 mL Intravenous Once Sharyn Creamer, MD      . vancomycin (VANCOCIN) IVPB 750 mg/150 ml premix  750 mg Intravenous Q48H Tukov-Yual, Magdalene S, NP         Objective: Vital signs in last 24 hours: Temp:  [98 F (36.7 C)-98.9 F (37.2 C)] 98.1 F (36.7 C) (09/23 0800) Pulse Rate:  [87-129] 104 (09/23 0700) Resp:  [18-27] 24 (09/23 0700) BP: (98-140)/(47-84) 111/76 (09/23 0700) SpO2:  [90 %-100 %] 100 % (09/23 0700)  Intake/Output from previous day: 09/22 0701 - 09/23 0700 In: 3631.3 [I.V.:1311.3; NG/GT:520; IV Piggyback:1350] Out: 975 [Urine:765; Emesis/NG  output:210] Intake/Output this shift: Total I/O In: -  Out: 250 [Urine:250]   Physical Exam  Constitutional: She appears well-developed and well-nourished. She appears listless. She appears ill.  HENT:  Head: Normocephalic and atraumatic.  Neck: Normal range of motion. Neck supple. No thyromegaly present.  Cardiovascular:  Sinus tachycardia without murmur.   Pulmonary/Chest: Effort normal and breath sounds normal. No respiratory distress.  Abdominal: Soft. She exhibits no mass. There is tenderness (diffuse).  Musculoskeletal: Normal range of motion. She exhibits no edema.  Lymphadenopathy:    She has no cervical adenopathy.       Right: No supraclavicular adenopathy present.       Left: No supraclavicular adenopathy present.  Neurological: She appears listless.  Alert but poorly responsive.    Skin: Skin is warm and dry.  Psychiatric:  Flat affect  Vitals reviewed.   Lab Results:  Recent Labs    01/27/18 0845 01/27/18 1905 01/28/18 0257  WBC 22.3*  --  14.1*  HGB 11.7* 10.2* 9.8*  HCT 37.2 32.0* 30.2*  PLT 491*  --  327   BMET Recent Labs    01/28/18 1313 01/28/18 1627  NA 143 146*  K 4.3 3.9  CL 115* 114*  CO2 15* 19*  GLUCOSE 317* 224*  BUN 72* 68*  CREATININE 2.62* 2.46*  CALCIUM 7.6* 7.8*   PT/INR Recent Labs    01/27/18 0845  LABPROT 14.9  INR 1.18   ABG Recent Labs    01/27/18 2156 01/28/18 0141  PHART 7.29* 7.46*  HCO3 9.1* 17.1*    Studies/Results: Ct Abdomen Pelvis Wo Contrast  Result Date: 01/28/2018 CLINICAL DATA:  Inpatient. Elevated lipase. Severe nausea and vomiting. Abdominal pain. Prior hysterectomy. EXAM: CT ABDOMEN AND PELVIS WITHOUT CONTRAST TECHNIQUE: Multidetector CT imaging of the abdomen and pelvis was performed following the standard protocol without IV contrast. COMPARISON:  01/27/2018 abdominal radiograph. 09/19/2011 CT abdomen/pelvis. FINDINGS: Lower chest: Right middle lobe 7 mm solid pulmonary nodule (series 3/image  6), increased from 5 mm in 2013 CT. Coronary atherosclerosis. Hepatobiliary: Normal liver size. No liver mass. Cholecystectomy. No biliary ductal dilatation. CBD diameter 5 mm. Pancreas: Stable coarse uncinate process pancreatic calcification, nonspecific, possibly vascular. No pancreatic mass or duct dilation. No significant peripancreatic fat stranding or fluid. Spleen: Normal size spleen. Bandlike low-attenuation focus in the lower spleen (series 2/image 20), new. Adrenals/Urinary Tract: Normal adrenals. Chronic severe asymmetric atrophy of the left kidney. Moderate bilateral hydroureteronephrosis to the level of the ureterovesical junction bilaterally. Hyperdense 1.1 cm anterior upper right renal cortical lesion, stable in size since 2013 although with new hyperdensity. Exophytic simple 1.1 cm posterior lower left renal cyst. No additional contour deforming renal lesions. No urolithiasis. Bladder is partially collapsed by indwelling Foley catheter with nondependent bladder gas compatible with instrumentation. No definite bladder wall thickening. Stomach/Bowel: Enteric tube terminates in the distal stomach. Collapsed and grossly normal stomach. Normal caliber small bowel with no small bowel wall thickening. Normal appendix. Normal large bowel with no diverticulosis, large bowel wall thickening or pericolonic fat stranding. Vascular/Lymphatic: Atherosclerotic nonaneurysmal abdominal aorta. No pathologically enlarged lymph nodes in the abdomen or pelvis. Reproductive: Status post hysterectomy, with no abnormal findings at the vaginal cuff. No adnexal mass. Other: No pneumoperitoneum, ascites or focal fluid collection. Moderate fat containing infraumbilical ventral abdominal hernia. Musculoskeletal: No aggressive appearing focal osseous lesions. Mild thoracolumbar spondylosis. Scattered subcentimeter sclerotic osseous lesions in sacrum and bilateral pelvic girdle are similar to prior CT, probably benign bone  islands. IMPRESSION: 1. Moderate bilateral hydroureteronephrosis to the level of the UVJ bilaterally, despite the presence of an indwelling Foley catheter, which only partially collapses the bladder. No evidence of obstructing urolithiasis or mass on this noncontrast scan. 2. No convincing  noncontrast CT findings of acute pancreatitis. 3. Bandlike low-attenuation focus in the inferior spleen, poorly characterized on this noncontrast scan, new since 2013 CT. Cannot exclude splenic infarct or mass. Suggest attention on follow-up MRI or CT abdomen in 3-6 months. 4. Enteric tube terminates in the distal stomach. No evidence of bowel obstruction or acute bowel inflammation. 5. Right middle lobe 7 mm solid pulmonary nodule, mildly increased in size since 2013 CT. This nodule remains below PET resolution. Suggest follow-up chest CT in 6 months. 6. Moderate fat containing infraumbilical ventral abdominal hernia. 7.  Aortic Atherosclerosis (ICD10-I70.0). Electronically Signed   By: Delbert Phenix M.D.   On: 01/28/2018 02:54   Dg Abd 1 View  Result Date: 01/27/2018 CLINICAL DATA:  NG tube placement EXAM: ABDOMEN - 1 VIEW COMPARISON:  01/27/2018 FINDINGS: NG tube coils in the stomach with the tip in the fundus. Nonobstructive bowel gas pattern. Prior cholecystectomy. IMPRESSION: NG tube coils in the stomach with the tip in the fundus. Electronically Signed   By: Charlett Nose M.D.   On: 01/27/2018 19:14   Dg Abd 1 View  Result Date: 01/27/2018 CLINICAL DATA:  NG tube placement EXAM: ABDOMEN - 1 VIEW COMPARISON:  01/27/2018 FINDINGS: NG tube is in the stomach. Nonobstructive bowel gas pattern. Visualized lung bases clear. Prior cholecystectomy. IMPRESSION: NG tube tip in the mid stomach. Electronically Signed   By: Charlett Nose M.D.   On: 01/27/2018 18:50   US Renal  Result Date: 01/27/2018 CLINICAL DATA:  Acute renal failure EXAM: RENAL / URINARY TRACT ULTRASOUND COMPLETE COMPARISON:  CT abdomen/pelvis dated  09/19/2011 FINDINGS: Right Kidney: Length: 10.5 cm. Echogenic renal parenchyma. 16 x 14 x 10 mm lower pole cyst. No hydronephrosis. Left Kidney: Length: 8.6 cm. Cortical scarring with lobular parenchyma. Echogenic renal parenchyma. Stable hydronephrosis versus extrarenal pelvis. Bladder: Within normal limits. IMPRESSION: Echogenic renal parenchyma, suggesting medical renal disease. Left renal cortical scarring with stable hydronephrosis versus extrarenal pelvis, chronic. This appearance is unchanged from 2013. 1.6 cm right lower pole renal cyst, simple. Electronically Signed   By: Charline Bills M.D.   On: 01/27/2018 12:41   Dg Chest Port 1 View  Result Date: 01/27/2018 CLINICAL DATA:  Fall this morning. Patient reports being diagnosed with a kidney infection last week. Vomiting. History of diabetes and hypertension. EXAM: PORTABLE CHEST 1 VIEW COMPARISON:  05/17/2006 radiographs.  CT neck 05/28/2017 FINDINGS: 0845 hours. The heart size is at the upper limits of normal for AP technique. There is aortic atherosclerosis. There is a left upper lobe lung nodule measuring approximately 14 mm (not corrected for magnification), unchanged from previous neck CT. The lungs are otherwise clear. There is no pleural effusion or pneumothorax. No acute osseous findings are evident. IMPRESSION: 1. No acute cardiopulmonary process. 2. Left upper lobe pulmonary nodule, which was enlarging on neck CT performed in January. This remains concerning for malignancy, and further evaluation recommended if not performed elsewhere. Electronically Signed   By: Carey Bullocks M.D.   On: 01/27/2018 09:17   Dg Abd Portable 2 Views  Result Date: 01/27/2018 CLINICAL DATA:  Fall this morning, patient has been weak for the past couple weeks and states that she was diagnosed with kidney infection a week ago and that pt has been having dark blood like emesis for the past couple days. Possible sepsis. Hx of diabetes, HTN, abdominal  hysterectomy. EXAM: PORTABLE ABDOMEN - 2 VIEW COMPARISON:  CT, 09/19/2011 FINDINGS: Normal bowel gas pattern.  No free air. No evidence  of renal or ureteral stones. Clips are upper quadrant reflect a prior cholecystectomy. There are scattered vascular calcifications. No acute skeletal abnormality.  Lung bases are clear. IMPRESSION: 1. No acute findings.  No evidence bowel obstruction or free air. Electronically Signed   By: Amie Portlandavid  Ormond M.D.   On: 01/27/2018 09:14   [place] Ekg Site Rite  Result Date: 01/28/2018 If Site Rite image not attached, placement could not be confirmed due to current cardiac rhythm.  I have reviewed her labs and imaging and discussed her case with Dr. Sherryll BurgerShah.  Renal US on 01/27/18 showed no hydro on the right and stable hydro on the left.   CT showed bilateral hydro.   Assessment: Bilateral hydronephrosis, chronic on the left with probable pyonephrosis and sepsis.  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   I am going to take her to the OR for cystoscopy and attempted stent insertion.  I have reviewed the risks with the family including bleeding, infection, ureteral injury, need for secondary procedures such and percutaneous nephrostomy placement, thrombotic events and anesthetic complications.     CC: CC: Dr. Roseanne Renoutul Shah and Dr. Dewaine Oatsenny Tate.      Bjorn PippinJohn Ravyn Nikkel 01/28/2018 [phone]

## 2018-01-28 NOTE — Progress Notes (Signed)
*  PRELIMINARY RESULTS* Echocardiogram 2D Echocardiogram has been performed.  Cristela BlueHege, Ollin Hochmuth 01/28/2018, 11:50 AM

## 2018-01-29 ENCOUNTER — Inpatient Hospital Stay: Payer: Medicare HMO

## 2018-01-29 ENCOUNTER — Encounter: Payer: Self-pay | Admitting: Radiology

## 2018-01-29 ENCOUNTER — Telehealth: Payer: Self-pay | Admitting: Urology

## 2018-01-29 DIAGNOSIS — R6520 Severe sepsis without septic shock: Secondary | ICD-10-CM

## 2018-01-29 DIAGNOSIS — A419 Sepsis, unspecified organism: Secondary | ICD-10-CM

## 2018-01-29 DIAGNOSIS — I4891 Unspecified atrial fibrillation: Secondary | ICD-10-CM

## 2018-01-29 LAB — MAGNESIUM: Magnesium: 1.6 mg/dL — ABNORMAL LOW (ref 1.7–2.4)

## 2018-01-29 LAB — COMPREHENSIVE METABOLIC PANEL
ALBUMIN: 2.7 g/dL — AB (ref 3.5–5.0)
ALT: 14 U/L (ref 0–44)
ANION GAP: 12 (ref 5–15)
AST: 19 U/L (ref 15–41)
Alkaline Phosphatase: 67 U/L (ref 38–126)
BILIRUBIN TOTAL: 0.7 mg/dL (ref 0.3–1.2)
BUN: 59 mg/dL — ABNORMAL HIGH (ref 8–23)
CO2: 17 mmol/L — ABNORMAL LOW (ref 22–32)
Calcium: 7.6 mg/dL — ABNORMAL LOW (ref 8.9–10.3)
Chloride: 117 mmol/L — ABNORMAL HIGH (ref 98–111)
Creatinine, Ser: 2.31 mg/dL — ABNORMAL HIGH (ref 0.44–1.00)
GFR calc Af Amer: 22 mL/min — ABNORMAL LOW (ref >60)
GFR, EST NON AFRICAN AMERICAN: 19 mL/min — AB (ref >60)
Glucose, Bld: 256 mg/dL — ABNORMAL HIGH (ref 70–99)
POTASSIUM: 4.7 mmol/L (ref 3.5–5.1)
Sodium: 146 mmol/L — ABNORMAL HIGH (ref 135–145)
TOTAL PROTEIN: 5.9 g/dL — AB (ref 6.5–8.1)

## 2018-01-29 LAB — BLOOD GAS, ARTERIAL
ACID-BASE DEFICIT: 6.9 mmol/L — AB (ref 0.0–2.0)
BICARBONATE: 17.2 mmol/L — AB (ref 20.0–28.0)
FIO2: 0.21
O2 SAT: 96.8 %
PO2 ART: 90 mmHg (ref 83.0–108.0)
Patient temperature: 37
pCO2 arterial: 29 mmHg — ABNORMAL LOW (ref 32.0–48.0)
pH, Arterial: 7.38 (ref 7.350–7.450)

## 2018-01-29 LAB — PROTIME-INR
INR: 1.18
Prothrombin Time: 14.9 seconds (ref 11.4–15.2)

## 2018-01-29 LAB — CBC WITH DIFFERENTIAL/PLATELET
BASOS ABS: 0 10*3/uL (ref 0–0.1)
Basophils Relative: 0 %
EOS PCT: 1 %
Eosinophils Absolute: 0.1 10*3/uL (ref 0–0.7)
HCT: 27.1 % — ABNORMAL LOW (ref 35.0–47.0)
Hemoglobin: 8.8 g/dL — ABNORMAL LOW (ref 12.0–16.0)
LYMPHS PCT: 12 %
Lymphs Abs: 1.5 10*3/uL (ref 1.0–3.6)
MCH: 30.4 pg (ref 26.0–34.0)
MCHC: 32.5 g/dL (ref 32.0–36.0)
MCV: 93.3 fL (ref 80.0–100.0)
MONO ABS: 0.8 10*3/uL (ref 0.2–0.9)
Monocytes Relative: 7 %
Neutro Abs: 9.7 10*3/uL — ABNORMAL HIGH (ref 1.4–6.5)
Neutrophils Relative %: 80 %
PLATELETS: 271 10*3/uL (ref 150–440)
RBC: 2.9 MIL/uL — ABNORMAL LOW (ref 3.80–5.20)
RDW: 15.1 % — AB (ref 11.5–14.5)
WBC: 12 10*3/uL — ABNORMAL HIGH (ref 3.6–11.0)

## 2018-01-29 LAB — SAMPLE TO BLOOD BANK

## 2018-01-29 LAB — GLUCOSE, CAPILLARY
GLUCOSE-CAPILLARY: 210 mg/dL — AB (ref 70–99)
GLUCOSE-CAPILLARY: 189 mg/dL — AB (ref 70–99)
GLUCOSE-CAPILLARY: 143 mg/dL — AB (ref 70–99)
GLUCOSE-CAPILLARY: 115 mg/dL — AB (ref 70–99)
Glucose-Capillary: 235 mg/dL — ABNORMAL HIGH (ref 70–99)
Glucose-Capillary: 198 mg/dL — ABNORMAL HIGH (ref 70–99)

## 2018-01-29 LAB — ECHOCARDIOGRAM COMPLETE
Height: 60 in
Weight: 2024.7 oz

## 2018-01-29 LAB — HEPARIN LEVEL (UNFRACTIONATED): HEPARIN UNFRACTIONATED: 0.65 [IU]/mL (ref 0.30–0.70)

## 2018-01-29 LAB — PROCALCITONIN: Procalcitonin: 0.75 ng/mL

## 2018-01-29 LAB — CK: CK TOTAL: 175 U/L (ref 38–234)

## 2018-01-29 LAB — PHOSPHORUS: Phosphorus: 2.2 mg/dL — ABNORMAL LOW (ref 2.5–4.6)

## 2018-01-29 LAB — LACTIC ACID, PLASMA: LACTIC ACID, VENOUS: 1.9 mmol/L (ref 0.5–1.9)

## 2018-01-29 LAB — LIPASE, BLOOD: LIPASE: 32 U/L (ref 11–51)

## 2018-01-29 LAB — T4, FREE: FREE T4: 1.26 ng/dL (ref 0.82–1.77)

## 2018-01-29 MED ORDER — HEPARIN BOLUS VIA INFUSION
1500.0000 [IU] | Freq: Once | INTRAVENOUS | Status: AC
  Administered 2018-01-29: 1500 [IU] via INTRAVENOUS
  Filled 2018-01-29: qty 1500

## 2018-01-29 MED ORDER — INSULIN GLARGINE 100 UNIT/ML ~~LOC~~ SOLN
14.0000 [IU] | Freq: Every day | SUBCUTANEOUS | Status: DC
  Administered 2018-01-29 – 2018-01-31 (×3): 14 [IU] via SUBCUTANEOUS
  Filled 2018-01-29 (×4): qty 0.14

## 2018-01-29 MED ORDER — METOPROLOL TARTRATE 5 MG/5ML IV SOLN
2.5000 mg | Freq: Four times a day (QID) | INTRAVENOUS | Status: DC
  Administered 2018-01-29 – 2018-01-30 (×4): 2.5 mg via INTRAVENOUS
  Filled 2018-01-29 (×4): qty 5

## 2018-01-29 MED ORDER — TECHNETIUM TO 99M ALBUMIN AGGREGATED
4.5300 | Freq: Once | INTRAVENOUS | Status: AC | PRN
  Administered 2018-01-29: 4.53 via INTRAVENOUS

## 2018-01-29 MED ORDER — HEPARIN (PORCINE) IN NACL 100-0.45 UNIT/ML-% IJ SOLN
900.0000 [IU]/h | INTRAMUSCULAR | Status: DC
  Administered 2018-01-29 – 2018-01-30 (×2): 800 [IU]/h via INTRAVENOUS
  Filled 2018-01-29 (×2): qty 250

## 2018-01-29 MED ORDER — METOPROLOL TARTRATE 5 MG/5ML IV SOLN
2.5000 mg | Freq: Once | INTRAVENOUS | Status: AC
  Administered 2018-01-29: 2.5 mg via INTRAVENOUS
  Filled 2018-01-29: qty 5

## 2018-01-29 MED ORDER — MAGNESIUM SULFATE 2 GM/50ML IV SOLN
2.0000 g | Freq: Once | INTRAVENOUS | Status: AC
  Administered 2018-01-29: 2 g via INTRAVENOUS
  Filled 2018-01-29: qty 50

## 2018-01-29 MED ORDER — TECHNETIUM TC 99M DIETHYLENETRIAME-PENTAACETIC ACID
30.0000 | Freq: Once | INTRAVENOUS | Status: AC | PRN
  Administered 2018-01-29: 34.52 via RESPIRATORY_TRACT

## 2018-01-29 MED ORDER — SODIUM CHLORIDE 0.45 % IV SOLN
INTRAVENOUS | Status: DC
  Administered 2018-01-29 – 2018-02-04 (×11): via INTRAVENOUS

## 2018-01-29 MED ORDER — ONDANSETRON HCL 4 MG PO TABS
4.0000 mg | ORAL_TABLET | Freq: Three times a day (TID) | ORAL | Status: DC | PRN
  Administered 2018-01-31: 4 mg via ORAL
  Filled 2018-01-29: qty 1

## 2018-01-29 MED ORDER — K PHOS MONO-SOD PHOS DI & MONO 155-852-130 MG PO TABS
500.0000 mg | ORAL_TABLET | Freq: Two times a day (BID) | ORAL | Status: AC
  Filled 2018-01-29 (×2): qty 2

## 2018-01-29 MED ORDER — INSULIN GLARGINE 100 UNIT/ML ~~LOC~~ SOLN
10.0000 [IU] | Freq: Every day | SUBCUTANEOUS | Status: DC
  Filled 2018-01-29: qty 0.1

## 2018-01-29 MED ORDER — SODIUM CHLORIDE 0.9 % IV SOLN
1.0000 g | INTRAVENOUS | Status: DC
  Administered 2018-01-30: 1 g via INTRAVENOUS
  Filled 2018-01-29: qty 1

## 2018-01-29 MED ORDER — SIMVASTATIN 20 MG PO TABS: 40.0000 mg | ORAL_TABLET | Freq: Every day | ORAL | Status: DC

## 2018-01-29 MED ORDER — ONDANSETRON HCL 4 MG/2ML IJ SOLN
4.0000 mg | Freq: Three times a day (TID) | INTRAMUSCULAR | Status: DC | PRN
  Administered 2018-01-29 – 2018-02-05 (×5): 4 mg via INTRAVENOUS
  Filled 2018-01-29 (×5): qty 2

## 2018-01-29 NOTE — Progress Notes (Signed)
PT Cancellation Note  Patient Details Name: Tiffany Manning MRN: [id number] DOB: 12/31/1940   Cancelled Treatment:    Reason Eval/Treat Not Completed: Patient not medically ready;Medical issues which prohibited therapy.  Order received.  Chart reviewed.  Patient currently has a documented RHR of 139 BPM.  Will re-attempt later when pt is more medically appropriate.   Glenetta HewSarah Lorece Keach, PT, DPT 01/29/2018, 1:30 PM

## 2018-01-29 NOTE — Progress Notes (Signed)
ANTICOAGULATION CONSULT NOTE  Pharmacy Consult for heparin Indication: atrial fibrillation  Allergies  Allergen Reactions  . Sulfa Antibiotics Nausea And Vomiting    Patient Measurements: Height: 5' (152.4 cm) Weight: 126 lb 8.7 oz (57.4 kg) IBW/kg (Calculated) : 45.5 Heparin Dosing Weight: 57 kg  Vital Signs: Temp: 97.6 F (36.4 C) (09/24 0802) Temp Source: Oral (09/24 0802) BP: 142/97 (09/24 1600) Pulse Rate: 116 (09/24 1500)  Labs: Recent Labs    01/27/18 0845 01/27/18 1004  01/27/18 1905  01/28/18 0257  01/28/18 1627 01/28/18 2050 01/29/18 0432 01/29/18 1845  HGB 11.7*  --   --  10.2*  --  9.8*  --   --   --  8.8*  --   HCT 37.2  --   --  32.0*  --  30.2*  --   --   --  27.1*  --   PLT 491*  --   --   --   --  327  --   --   --  271  --   LABPROT 14.9  --   --   --   --   --   --   --   --  14.9  --   INR 1.18  --   --   --   --   --   --   --   --  1.18  --   HEPARINUNFRC  --   --   --   --   --   --   --   --   --   --  0.65  CREATININE 3.40* 3.19*   < > 2.60*   < > 2.62*   < > 2.46* 2.42* 2.31*  --   CKTOTAL  --  49  --   --   --   --   --   --   --  175  --    < > = values in this interval not displayed.    Estimated Creatinine Clearance: 15.9 mL/min (A) (by C-G formula based on SCr of 2.31 mg/dL (H)).   Medical History: Past Medical History:  Diagnosis Date  . Diabetes mellitus without complication (HCC)   . Hyperlipidemia   . Hypertension     Assessment: 77 year old female presented with severe sepsis. Patient with atrial fibrillation with RVR, unknown date of onset. Pharmacy consulted to dose heparin. Last dose of subQ heparin 9/24 at 0543.  Goal of Therapy:  Heparin level 0.3-0.7 units/ml Monitor platelets by anticoagulation protocol: Yes   Plan:  9/24 @ 1845 HL: 0.65. Level is therapeutic x 1 dose. Will continue heparin drip at 800 units/hr. Recheck HL in 8 hours.  CBC ordered with morning labs per protocol.   Gardner CandleSheema M Mercede Rollo, PharmD,  BCPS Clinical Pharmacist 01/29/2018 7:17 PM

## 2018-01-29 NOTE — Progress Notes (Signed)
Initial Nutrition Assessment  DOCUMENTATION CODES:   Not applicable  INTERVENTION:   Recommend Vitamin C 230m po BID  Recommend Ocuvite daily for wound healing (provides zinc, vitamin A, vitamin C, Vitamin E, copper, and selenium)  Recommend daily MVI  Recommend Magic cup TID with meals, each supplement provides 290 kcal and 9 grams of protein  Recommend snacks (yogurt, peanut butter snacks, pudding)  NUTRITION DIAGNOSIS:   Inadequate oral intake related to acute illness as evidenced by per patient/family report.  GOAL:   Patient will meet greater than or equal to 90% of their needs  MONITOR:   PO intake, Labs, Weight trends, Skin, I & O's  REASON FOR ASSESSMENT:   Malnutrition Screening Tool    ASSESSMENT:   77y.o. caucasian female with medical problems of Diabetes, HTN, HLD, salivary gland stones [facility] of generalized fatigue and is Dx with severe sepsis, UTI and ARF.   Met with pt and pt's husband in room today. Pt lethargic and sleeping so history obtained from pt's husband at bedside. Per the husband, pt with poor appetite and oral intake for 2-3 weeks pta. Husband reports "she has not eaten anything, literally". Pt does not drink supplements and will not drink them per husband. At baseline, pt eats a lot of soups, oatmeal, cream of wheat, ice cream and yogurt. Husband is unsure if pt has had any weight loss; per chart, pt appears of similar weight that she weighed in January of this year. Pt with multiple non healing wounds; RD will add supplements and vitamins to encourage wound healing. Pt currently NPO awaiting swallow evaluation. Pt refeeding after IV dextrose; P and Mg given and will recheck labs tomorrow.     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  Medications reviewed and include: insulin, KPhos, NaCl _0 /hr,  cefepime, pepcid, heaprin, vancomycin  Labs reviewed: Na 146(H), K 4.7 wnl, BUN 59(H), creat 2.31(H), P 2.2(L), Mg 1.6(L) Wbc- 12.0(H), Hgb 8.8(L), Hct 27.1(L) cbgs- 235, 210 x 24 hrs  NUTRITION - FOCUSED PHYSICAL EXAM:    Most Recent Value  Orbital Region  No depletion  Upper Arm Region  Mild depletion  Thoracic and Lumbar Region  Mild depletion  Buccal Region  No depletion  Temple Region  No depletion  Clavicle Bone Region  Mild depletion  Clavicle and Acromion Bone Region  Mild depletion  Scapular Bone Region  Unable to assess  Dorsal Hand  Mild depletion  Patellar Region  Mild depletion  Anterior Thigh Region  Mild depletion  Posterior Calf Region  Mild depletion  Edema (RD Assessment)  None  Hair  Reviewed  Eyes  Reviewed  Mouth  Reviewed  Skin  Reviewed  Nails  Reviewed     Diet Order:   Diet Order            Diet NPO time specified  Diet effective now             EDUCATION NEEDS:   No education needs have been identified at this time  Skin:  Skin Assessment: Reviewed RN Assessment(non pressure injury leg ( 9cm x 5.5cm x 0.2cm ), Stage I coccyx, incision perineum )  Last BM:  9/24- type 6  Height:   Ht Readings from Last 1 Encounters:  01/27/18 5' (1.524 m)    Weight:   Wt Readings from Last 1 Encounters:  01/27/18  57.4 kg    Ideal Body Weight:  45.4 kg  BMI:  Body mass index is 24.71 kg/m.  Estimated Nutritional Needs:   Kcal:  1400-1600kcal/day   Protein:  74-86g/day   Fluid:  >1.4L/day   Koleen Distance MS, RD, LDN Pager #- [phone] Office#- [phone] After Hours Pager: [phone]

## 2018-01-29 NOTE — Consult Note (Signed)
Cardiology Consultation:   Patient ID: Tiffany Manning MRN: [id number]; DOB: 1940-06-14  Admit date: 01/27/2018 Date of Consult: 01/29/2018  Primary Care Provider: Jaclyn Shaggy, MD Primary Cardiologist: New to [facility]- Dr. Cristal Manning End Primary Electrophysiologist:  None    Patient Profile:   Tiffany Manning is a 77 y.o. female with a hx of DM2, HLD, HTN who is being seen today for the evaluation of newly diagnosed Atrial Fibrillation with RVR (of unknown onset) and at the request of Dr. Sherryll Manning.  History of Present Illness:   [name] is a 76 yo female with PMH as above and no self reported cardiac history with cardiology consulted d/t reported new onset Afib. Of note, the patient has seen a cardiologist in the past per [facility]; however, she has no recollection of seeing this doctor. Her husband does report she saw a cardiologist once while in the hospital "years and years ago," but that it wasn't for a problem with her heart that he knew about. Also of note, the patient is not able to recall the reason she is taking a beta blocker, statin or diuretic PTA.  The patient reportedly seen her PCP (Dr. Arlana Manning) for an ongoing UTI  kidney infection that has spanned a couple of years and not responded to several different antibiotics. The patient's PCP most recently documented that he cultured E Coli from the patient's urine ~ 9 days ago (9/15), proving sensitive to cefepime. The patient was therefore started on Macrobid without any improvement. For the last 7-9 days, the patient reportedly has not been able to eat or drink. Approximately 4 days ago (9/20), the patient experienced several episodes of dark non-bloody emesis. Other associated symptoms reportedly included fatigue and a near fall due to fatigue. Patient denies any associated chest pain, SOB, syncope, near-syncope sx, or palpitations at that time. PTA medications included Macrobid, Atenolol 50mg  BID, Zocor 40mg , Maxzide 25 37.5-25 BID.     The patient arrived via EMS at [facility] ED 01/27/2018 and after her husband called 911 d/t her almost falling down d/t her ongoing weakness and fatigue. In the ED, she was found to be severely acidotic with severe protein-calorie malnutrition, possible DKA, and acute renal failure. Vitals were significant for hypotension and atrial fibrillation with blood pressure (!) 90/43, pulse (!) 130, temperature (!) 96.5 F (35.8 C), temperature, RR (!) 28. CXR was negative for acute cardiopulmonary process but did show enlarging LUL pulmonary nodule when compared with previous 05/2017 scan and thus read as concerning for malignancy. EKG showed Afib with RVR with ventricular rate of 120 bpm & QTc 470. Labs were significant for CO2 8, glucose 328, Cr 3.40, anion gap 25, lipase 148, WBC 22.3, Hgb 11.7, plt 491. UA was positive for infection. Blood cultures were obtained (+MRSA), and the patient was started on broad spectrum abx and IVF for severe sepsis with lactic acidosis likely related to UTI and urosepsis.   Since admission, acidosis and Cr improving. Yesterday 9/23, patient received b/l ureteral stents by Dr. Annabell Manning with b/l pylonephrosis suspected. 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 Patient continues to remain nauseas. TTE was obtained and showed EF 55-65%, mild LVH, and moderate TR with LA at upper limits of normal size and RA mildly dilated. Patient was started on Lopressor 2.5mg  injection q6h this morning (9/24) with little improvement in HR and therefore another 2.5mg  of Lopressor administered 2h later. Remainder as below in A/P.  Past Medical History:  Diagnosis Date  .  Diabetes mellitus without complication (HCC)   . Hyperlipidemia   . Hypertension     Past Surgical History:  Procedure Laterality Date  . ABDOMINAL HYSTERECTOMY       Home Medications:  Prior to Admission medications   Medication Sig Start Date End Date Taking? Authorizing  Provider  atenolol (TENORMIN) 50 MG tablet Take 50 mg by mouth 2 (two) times daily. 05/04/17  Yes [provider]  glipiZIDE-metformin (METAGLIP) 5-500 MG tablet Take 1 tablet by mouth 2 (two) times daily. 04/03/17  Yes [provider]  nitrofurantoin, macrocrystal-monohydrate, (MACROBID) 100 MG capsule Take 100 mg by mouth 2 (two) times daily. For 7 days 01/19/18  Yes [provider]  simvastatin (ZOCOR) 40 MG tablet Take 40 mg by mouth at bedtime.    Yes [provider]  triamterene-hydrochlorothiazide (MAXZIDE-25) 37.5-25 MG tablet Take 1 tablet by mouth 2 (two) times daily.   Yes [provider]  Multiple Vitamin (MULTIVITAMIN WITH MINERALS) TABS tablet Take 1 tablet by mouth daily. Patient not taking: Reported on 01/27/2018 06/07/17   Tiffany Lab, MD    Inpatient Medications: Scheduled Meds: . chlorhexidine  15 mL Mouth Rinse BID  . heparin  5,000 Units Subcutaneous Q8H  . insulin aspart  0-9 Units Subcutaneous Q4H  . insulin glargine  10 Units Subcutaneous QHS  . mouth rinse  15 mL Mouth Rinse q12n4p  . metoCLOPramide (REGLAN) injection  5 mg Intravenous Q6H  . metoprolol tartrate  2.5 mg Intravenous Q6H   Continuous Infusions: . ceFEPime (MAXIPIME) IV 1 g (01/28/18 0759)  . dextrose 5 % and 0.45 % NaCl with KCl 40 mEq/L 100 mL/hr at 01/29/18 0237  . famotidine (PEPCID) IV Stopped (01/28/18 2145)  . sodium chloride    . vancomycin     PRN Meds: acetaminophen **OR** acetaminophen, promethazine  Allergies:    Allergies  Allergen Reactions  . Sulfa Antibiotics Nausea And Vomiting    Social History:   Social History   Socioeconomic History  . Marital status: Married    Spouse name: Not on file  . Number of children: Not on file  . Years of education: Not on file  . Highest education level: Not on file  Occupational History  . Not on file  Social Needs  . Financial resource strain: Not hard at all  . Food insecurity:     Worry: Never true    Inability: Never true  . Transportation needs:    Medical: No    Non-medical: No  Tobacco Use  . Smoking status: Never Smoker  . Smokeless tobacco: Never Used  Substance and Sexual Activity  . Alcohol use: No    Frequency: Never  . Drug use: No  . Sexual activity: Not Currently  Lifestyle  . Physical activity:    Days per week: 0 days    Minutes per session: 0 min  . Stress: Not at all  Relationships  . Social connections:    Talks on phone: Twice a week    Gets together: Twice a week    Attends religious service: More than 4 times per year    Active member of club or organization: Yes    Attends meetings of clubs or organizations: 1 to 4 times per year    Relationship status: Married  . Intimate partner violence:    Fear of current or ex partner: No    Emotionally abused: No    Physically abused: No    Forced sexual activity: No  Other Topics Concern  . Not on file  Social History Narrative   Lives with husband at home    Family History:    Family History  Problem Relation Age of Onset  . Diabetes Sister      ROS:  Please see the history of present illness.   All other ROS reviewed and negative.     Physical Exam/Data:   Vitals:   01/29/18 0435 01/29/18 0500 01/29/18 0600 01/29/18 0700  BP:  131/84 (!) 112/94 (!) 147/81  Pulse:  (!) 113 (!) 117 (!) 123  Resp:  (!) 27 (!) 23 (!) 22  Temp:      TempSrc:      SpO2: 96% 95% 97% 98%  Weight:      Height:        Intake/Output Summary (Last 24 hours) at 01/29/2018 0753 Last data filed at 01/29/2018 0700 Gross per 24 hour  Intake 2472.54 ml  Output 992 ml  Net 1480.54 ml   Filed Weights   01/27/18 0840 01/27/18 1122  Weight: 54.4 kg 57.4 kg   Body mass index is 24.71 kg/m.  General: Frail elderly woman, uncomfortable HEENT: normal Lymph: no adenopathy Neck: +6-7 CVP, No HJR  Endocrine:  No thryomegaly Vascular: No carotid bruits; FA pulses 2+ bilaterally without bruits   Cardiac:  normal S1, S2; IRIR; no murmur  Lungs:  Wheezing throughout, no rhonchi or rales  Abd: soft, nontender, no hepatomegaly. NG tube. Ext: no edema. Left leg wound with clean, intact, and dry bandage. Musculoskeletal:  No deformities Skin: warm and dry  Neuro:   no focal abnormalities noted Psych:  Normal affect   EKG:  The EKG was personally reviewed and demonstrates:  IRIR, ventricular rate 118bpm Telemetry:  Telemetry was personally reviewed and demonstrates:  IRIR, ventricular rate 120s  Relevant CV Studies: TTE 05/30/2017 - Left ventricle: The cavity size was normal. Wall thickness was   increased in a pattern of mild LVH. Systolic function was normal.   The estimated ejection fraction was in the range of 55% to 65%. - Aortic valve: Valve area (Vmax): 2.21 cm^2. - Mitral valve: There was mild regurgitation. - Right atrium: The atrium was mildly dilated. - Tricuspid valve: There was moderate regurgitation.  Laboratory Data:  Chemistry Recent Labs  Manning 01/28/18 1627 01/28/18 2050 01/29/18 0432  NA 146* 144 146*  K 3.9 3.9 4.7  CL 114* 116* 117*  CO2 19* 18* 17*  GLUCOSE 224* 127* 256*  BUN 68* 67* 59*  CREATININE 2.46* 2.42* 2.31*  CALCIUM 7.8* 7.4* 7.6*  GFRNONAA 18* 18* 19*  GFRAA 21* 21* 22*  ANIONGAP 13 10 12     Recent Labs  Manning 01/27/18 0845 01/29/18 0432  PROT 7.9 5.9*  ALBUMIN 3.8 2.7*  AST 21 19  ALT 23 14  ALKPHOS 104 67  BILITOT 1.0 0.7   Hematology Recent Labs  Manning 01/27/18 0845 01/27/18 1905 01/28/18 0257 01/29/18 0432  WBC 22.3*  --  14.1* 12.0*  RBC 3.83  --  3.26* 2.90*  HGB 11.7* 10.2* 9.8* 8.8*  HCT 37.2 32.0* 30.2* 27.1*  MCV 97.1  --  92.5 93.3  MCH 30.6  --  30.1 30.4  MCHC 31.5*  --  32.6 32.5  RDW 15.3*  --  14.8* 15.1*  PLT 491*  --  327 271   Cardiac EnzymesNo results for input(s): TROPONINI in the last 168 hours. No results for input(s): TROPIPOC in the last 168 hours.  BNPNo results  for input(s): BNP, PROBNP  in the last 168 hours.  DDimer No results for input(s): DDIMER in the last 168 hours.  Radiology/Studies:  Ct Abdomen Pelvis Wo Contrast  Result Date: 01/28/2018 CLINICAL DATA:  Inpatient. Elevated lipase. Severe nausea and vomiting. Abdominal pain. Prior hysterectomy. EXAM: CT ABDOMEN AND PELVIS WITHOUT CONTRAST TECHNIQUE: Multidetector CT imaging of the abdomen and pelvis was performed following the standard protocol without IV contrast. COMPARISON:  01/27/2018 abdominal radiograph. 09/19/2011 CT abdomen/pelvis. FINDINGS: Lower chest: Right middle lobe 7 mm solid pulmonary nodule (series 3/image 6), increased from 5 mm in 2013 CT. Coronary atherosclerosis. Hepatobiliary: Normal liver size. No liver mass. Cholecystectomy. No biliary ductal dilatation. CBD diameter 5 mm. Pancreas: Stable coarse uncinate process pancreatic calcification, nonspecific, possibly vascular. No pancreatic mass or duct dilation. No significant peripancreatic fat stranding or fluid. Spleen: Normal size spleen. Bandlike low-attenuation focus in the lower spleen (series 2/image 20), new. Adrenals/Urinary Tract: Normal adrenals. Chronic severe asymmetric atrophy of the left kidney. Moderate bilateral hydroureteronephrosis to the level of the ureterovesical junction bilaterally. Hyperdense 1.1 cm anterior upper right renal cortical lesion, stable in size since 2013 although with new hyperdensity. Exophytic simple 1.1 cm posterior lower left renal cyst. No additional contour deforming renal lesions. No urolithiasis. Bladder is partially collapsed by indwelling Foley catheter with nondependent bladder gas compatible with instrumentation. No definite bladder wall thickening. Stomach/Bowel: Enteric tube terminates in the distal stomach. Collapsed and grossly normal stomach. Normal caliber small bowel with no small bowel wall thickening. Normal appendix. Normal large bowel with no diverticulosis, large bowel wall thickening or pericolonic fat  stranding. Vascular/Lymphatic: Atherosclerotic nonaneurysmal abdominal aorta. No pathologically enlarged lymph nodes in the abdomen or pelvis. Reproductive: Status post hysterectomy, with no abnormal findings at the vaginal cuff. No adnexal mass. Other: No pneumoperitoneum, ascites or focal fluid collection. Moderate fat containing infraumbilical ventral abdominal hernia. Musculoskeletal: No aggressive appearing focal osseous lesions. Mild thoracolumbar spondylosis. Scattered subcentimeter sclerotic osseous lesions in sacrum and bilateral pelvic girdle are similar to prior CT, probably benign bone islands. IMPRESSION: 1. Moderate bilateral hydroureteronephrosis to the level of the UVJ bilaterally, despite the presence of an indwelling Foley catheter, which only partially collapses the bladder. No evidence of obstructing urolithiasis or mass on this noncontrast scan. 2. No convincing noncontrast CT findings of acute pancreatitis. 3. Bandlike low-attenuation focus in the inferior spleen, poorly characterized on this noncontrast scan, new since 2013 CT. Cannot exclude splenic infarct or mass. Suggest attention on follow-up MRI or CT abdomen in 3-6 months. 4. Enteric tube terminates in the distal stomach. No evidence of bowel obstruction or acute bowel inflammation. 5. Right middle lobe 7 mm solid pulmonary nodule, mildly increased in size since 2013 CT. This nodule remains below PET resolution. Suggest follow-up chest CT in 6 months. 6. Moderate fat containing infraumbilical ventral abdominal hernia. 7.  Aortic Atherosclerosis (ICD10-I70.0). Electronically Signed   By: Delbert Phenix M.D.   On: 01/28/2018 02:54   Dg Abd 1 View  Result Date: 01/27/2018 CLINICAL DATA:  NG tube placement EXAM: ABDOMEN - 1 VIEW COMPARISON:  01/27/2018 FINDINGS: NG tube coils in the stomach with the tip in the fundus. Nonobstructive bowel gas pattern. Prior cholecystectomy. IMPRESSION: NG tube coils in the stomach with the tip in the  fundus. Electronically Signed   By: Charlett Nose M.D.   On: 01/27/2018 19:14   Dg Abd 1 View  Result Date: 01/27/2018 CLINICAL DATA:  NG tube placement EXAM: ABDOMEN - 1 VIEW COMPARISON:  01/27/2018  FINDINGS: NG tube is in the stomach. Nonobstructive bowel gas pattern. Visualized lung bases clear. Prior cholecystectomy. IMPRESSION: NG tube tip in the mid stomach. Electronically Signed   By: Charlett Nose M.D.   On: 01/27/2018 18:50   US Renal  Result Date: 01/27/2018 CLINICAL DATA:  Acute renal failure EXAM: RENAL / URINARY TRACT ULTRASOUND COMPLETE COMPARISON:  CT abdomen/pelvis dated 09/19/2011 FINDINGS: Right Kidney: Length: 10.5 cm. Echogenic renal parenchyma. 16 x 14 x 10 mm lower pole cyst. No hydronephrosis. Left Kidney: Length: 8.6 cm. Cortical scarring with lobular parenchyma. Echogenic renal parenchyma. Stable hydronephrosis versus extrarenal pelvis. Bladder: Within normal limits. IMPRESSION: Echogenic renal parenchyma, suggesting medical renal disease. Left renal cortical scarring with stable hydronephrosis versus extrarenal pelvis, chronic. This appearance is unchanged from 2013. 1.6 cm right lower pole renal cyst, simple. Electronically Signed   By: Charline Bills M.D.   On: 01/27/2018 12:41   Dg Chest Port 1 View  Result Date: 01/27/2018 CLINICAL DATA:  Fall this morning. Patient reports being diagnosed with a kidney infection last week. Vomiting. History of diabetes and hypertension. EXAM: PORTABLE CHEST 1 VIEW COMPARISON:  05/17/2006 radiographs.  CT neck 05/28/2017 FINDINGS: 0845 hours. The heart size is at the upper limits of normal for AP technique. There is aortic atherosclerosis. There is a left upper lobe lung nodule measuring approximately 14 mm (not corrected for magnification), unchanged from previous neck CT. The lungs are otherwise clear. There is no pleural effusion or pneumothorax. No acute osseous findings are evident. IMPRESSION: 1. No acute cardiopulmonary process. 2.  Left upper lobe pulmonary nodule, which was enlarging on neck CT performed in January. This remains concerning for malignancy, and further evaluation recommended if not performed elsewhere. Electronically Signed   By: Carey Bullocks M.D.   On: 01/27/2018 09:17   Dg Abd Portable 2 Views  Result Date: 01/27/2018 CLINICAL DATA:  Fall this morning, patient has been weak for the past couple weeks and states that she was diagnosed with kidney infection a week ago and that pt has been having dark blood like emesis for the past couple days. Possible sepsis. Hx of diabetes, HTN, abdominal hysterectomy. EXAM: PORTABLE ABDOMEN - 2 VIEW COMPARISON:  CT, 09/19/2011 FINDINGS: Normal bowel gas pattern.  No free air. No evidence of renal or ureteral stones. Clips are upper quadrant reflect a prior cholecystectomy. There are scattered vascular calcifications. No acute skeletal abnormality.  Lung bases are clear. IMPRESSION: 1. No acute findings.  No evidence bowel obstruction or free air. Electronically Signed   By: Amie Portland M.D.   On: 01/27/2018 09:14   [place] Ekg Site Rite  Result Date: 01/28/2018 If Site Rite image not attached, placement could not be confirmed due to current cardiac rhythm.   Assessment and Plan:   Atrial Fibrillation with Rapid Ventricular Rate, unknown date of onset, and in setting of severe sepsis with UTI and ARF - No reported chest pain, SOB - Echo as above - cannot exclude PE. Future workup might include CT angio with further improvement in kidney function. EKG and telemetry - IRIR with ventricular rate 110s-120s. - TSH 1.949 (9/23); Potassium 3.2  4.3  3.9  3.9  4.7; Magnesium 2.1  2.0  1.8  1.6 Creatine 2.44  2.62  2.46  2.42  2.31 (baseline ~1.3). - Afib with RVR of unknown date of onset but newly diagnosed and rates likely exacerbated by sepsis / infection with hypotension and and chronic kidney infection. Consider rebound tachycardia as well given  emesis has resulted in her  stopping her home Atenolol (she cannot keep it down) and d/t electrolyte imbalance as trended directly above.  - CHA2DS2VASc score of at least  5 (HTN, age x2, DM, Female) - Plan: Continue lopressor 2.5mg  q6h with hold parameters Hold for HR less than 60 and / or SBP less than 100. Plan for heart rate control only at this time and consideration of rate control in the future when resolution of illness. Suspect heart rate will improve with  blocker and also rates will improve with resolution of illness. Start on heparin given not on anticoagulation PTA. She will need long term anticoagulation with CHA2DS2VASc score of at least 5 as above. Continue to monitor vitals, daily CBC (on heparin), daily BMET to monitor electrolytes and renal function/ kidney injury. Caution with fluids d/t RV strain as noted in echo.  Acute on Chronic Renal Failure, in setting of sepsis with chronic UTI and b/l hydropnephrosis -Caution with fluids as above. Monitor I&O's / urinary output / weights. Follow daily BMP. Scr improving as above. Caution with contrast procedures. Avoid nephrotoxic agents. Hold metformin containing medications (Metaglip), diuretics with plan to restart if renal function returns to baseline. Patient will need renal dosing for any renally cleared medications. - Per IM  Hyperkalemia, s/p potassium repletion and in the setting of sepsis  - Likely etiologies include replacement / K repletion d/t previous hypokalemia, AKI due to volume deficiency in setting of sepsis / emesis / diuretic use. Hold diuretics and caution with nephrotoxic medications as above. EKG shows PVCs. Monitor closely with plan for fluids / insulin / glucose / calcium gluconate /kayexalate if needed. Caution with fluids d/t RV strain noted in echo. Recheck EKG/BMP in AM. - Per IM  Hypernatremia - Continue to monitor  - Per IM  HTN  - Home medications include atenolol and maxide-25 - continue to hold. - Continue Lopressor with HR and  BP hold parameters as above - Continue to monitor vitals  HLD - Home statin - currently held. Restart statin as labs show normal liver function.  - Per IM  DM2 - Hyperglycemic - SSI - Continue to hold metformin d/t renal insufficiency - Per IM  Remaining per IM    For questions or updates, please contact [facility] Please consult [email or web address] for contact info under     Signed, Lennon Alstrom, PA-C  01/29/2018 7:53 AM

## 2018-01-29 NOTE — Progress Notes (Deleted)
Pts daughter called RN saying that they can not find pt's prescriptions that were given to her in the white discharge envelope. MD called to see if he could reprint scripts or send them to [facility] for her. Since it has been greater than 2 hours since the pt has left the hospital the MD is not able to resend or reprint scripts. RN called pt's daughter back and advised her to call PCP to get prescriptions filled if they were still unable to find prescriptions. Pt's daughter verbalized understanding.  

## 2018-01-29 NOTE — Progress Notes (Signed)
[facility] [place] [facility]   PATIENT NAME: Tiffany Manning    MR#:  [id number]  DATE OF BIRTH:  Mar 31, 1941  SUBJECTIVE:   Patient admitted to the hospital yesterday secondary to severe sepsis secondary to UTI along with acute diabetic ketoacidosis.  DKA has now resolved, patient underwent bilateral ureteral stent placements yesterday.  Urine is clearing, renal function about the same, patient was noted to be new onset A. fib and echocardiogram showed pulmonary hypertension and therefore underwent VQ scan to rule out PE which was low probability and Dopplers of lower extremities negative for DVT.  Patient is more awake and feeling better.  REVIEW OF SYSTEMS:    Review of Systems  Unable to perform ROS: Mental acuity    Nutrition: NPO Tolerating Diet: No Tolerating PT: Await Eval.   DRUG ALLERGIES:   Allergies  Allergen Reactions  . Sulfa Antibiotics Nausea And Vomiting    VITALS:  Blood pressure 107/70, pulse (!) 118, temperature 97.6 F (36.4 C), temperature source Oral, resp. rate 17, height 5' (1.524 m), weight 57.4 kg, SpO2 98 %.  PHYSICAL EXAMINATION:   Physical Exam  GENERAL:  77 y.o.-year-old patient lying in bed moaning at times.    EYES: Pupils equal, round, reactive to light and accommodation. No scleral icterus. Extraocular muscles intact.  HEENT: Head atraumatic, normocephalic. Oropharynx and nasopharynx clear. Dry Oral Mucosa NECK:  Supple, no jugular venous distention. No thyroid enlargement, no tenderness.  LUNGS: Normal breath sounds bilaterally, no wheezing, rales, rhonchi. No use of accessory muscles of respiration.  CARDIOVASCULAR: S1, S2 Irregular. No murmurs, rubs, or gallops.  ABDOMEN: Soft, nontender, nondistended. Bowel sounds present. No organomegaly or mass.  EXTREMITIES: No cyanosis, clubbing or edema b/l.    NEUROLOGIC: Cranial nerves II through XII are intact. No focal Motor or sensory deficits b/l. Globally weak.    PSYCHIATRIC: The patient is alert and oriented x 3.  SKIN: No obvious rash, lesion, or ulcer.   Foley in place with yellow urine draining.    LABORATORY PANEL:   CBC Recent Labs  Lab 01/29/18 0432  WBC 12.0*  HGB 8.8*  HCT 27.1*  PLT 271   ------------------------------------------------------------------------------------------------------------------  Chemistries  Recent Labs  Lab 01/29/18 0432  NA 146*  K 4.7  CL 117*  CO2 17*  GLUCOSE 256*  BUN 59*  CREATININE 2.31*  CALCIUM 7.6*  MG 1.6*  AST 19  ALT 14  ALKPHOS 67  BILITOT 0.7   ------------------------------------------------------------------------------------------------------------------  Cardiac Enzymes No results for input(s): TROPONINI in the last 168 hours. ------------------------------------------------------------------------------------------------------------------  RADIOLOGY:  Ct Abdomen Pelvis Wo Contrast  Result Date: 01/28/2018 CLINICAL DATA:  Inpatient. Elevated lipase. Severe nausea and vomiting. Abdominal pain. Prior hysterectomy. EXAM: CT ABDOMEN AND PELVIS WITHOUT CONTRAST TECHNIQUE: Multidetector CT imaging of the abdomen and pelvis was performed following the standard protocol without IV contrast. COMPARISON:  01/27/2018 abdominal radiograph. 09/19/2011 CT abdomen/pelvis. FINDINGS: Lower chest: Right middle lobe 7 mm solid pulmonary nodule (series 3/image 6), increased from 5 mm in 2013 CT. Coronary atherosclerosis. Hepatobiliary: Normal liver size. No liver mass. Cholecystectomy. No biliary ductal dilatation. CBD diameter 5 mm. Pancreas: Stable coarse uncinate process pancreatic calcification, nonspecific, possibly vascular. No pancreatic mass or duct dilation. No significant peripancreatic fat stranding or fluid. Spleen: Normal size spleen. Bandlike low-attenuation focus in the lower spleen (series 2/image 20), new. Adrenals/Urinary Tract: Normal adrenals. 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 Moderate bilateral hydroureteronephrosis to the level of the ureterovesical junction  bilaterally. Hyperdense 1.1 cm anterior upper right renal cortical lesion, stable in size since 2013 although with new hyperdensity. Exophytic simple 1.1 cm posterior lower left renal cyst. No additional contour deforming renal lesions. No urolithiasis. Bladder is partially collapsed by indwelling Foley catheter with nondependent bladder gas compatible with instrumentation. No definite bladder wall thickening. Stomach/Bowel: Enteric tube terminates in the distal stomach. Collapsed and grossly normal stomach. Normal caliber small bowel with no small bowel wall thickening. Normal appendix. Normal large bowel with no diverticulosis, large bowel wall thickening or pericolonic fat stranding. Vascular/Lymphatic: Atherosclerotic nonaneurysmal abdominal aorta. No pathologically enlarged lymph nodes in the abdomen or pelvis. Reproductive: Status post hysterectomy, with no abnormal findings at the vaginal cuff. No adnexal mass. Other: No pneumoperitoneum, ascites or focal fluid collection. Moderate fat containing infraumbilical ventral abdominal hernia. Musculoskeletal: No aggressive appearing focal osseous lesions. Mild thoracolumbar spondylosis. Scattered subcentimeter sclerotic osseous lesions in sacrum and bilateral pelvic girdle are similar to prior CT, probably benign bone islands. IMPRESSION: 1. Moderate bilateral hydroureteronephrosis to the level of the UVJ bilaterally, despite the presence of an indwelling Foley catheter, which only partially collapses the bladder. No evidence of obstructing urolithiasis or mass on this noncontrast scan. 2. No convincing noncontrast CT findings of acute pancreatitis. 3. Bandlike low-attenuation focus in the inferior spleen, poorly characterized on this noncontrast scan, new since 2013 CT. Cannot exclude splenic infarct or mass. Suggest attention on follow-up MRI or CT  abdomen in 3-6 months. 4. Enteric tube terminates in the distal stomach. No evidence of bowel obstruction or acute bowel inflammation. 5. Right middle lobe 7 mm solid pulmonary nodule, mildly increased in size since 2013 CT. This nodule remains below PET resolution. Suggest follow-up chest CT in 6 months. 6. Moderate fat containing infraumbilical ventral abdominal hernia. 7.  Aortic Atherosclerosis (ICD10-I70.0). Electronically Signed   By: Delbert PhenixJason A Poff M.D.   On: 01/28/2018 02:54   Dg Abd 1 View  Result Date: 01/27/2018 CLINICAL DATA:  NG tube placement EXAM: ABDOMEN - 1 VIEW COMPARISON:  01/27/2018 FINDINGS: NG tube coils in the stomach with the tip in the fundus. Nonobstructive bowel gas pattern. Prior cholecystectomy. IMPRESSION: NG tube coils in the stomach with the tip in the fundus. Electronically Signed   By: Charlett NoseKevin  Dover M.D.   On: 01/27/2018 19:14   Dg Abd 1 View  Result Date: 01/27/2018 CLINICAL DATA:  NG tube placement EXAM: ABDOMEN - 1 VIEW COMPARISON:  01/27/2018 FINDINGS: NG tube is in the stomach. Nonobstructive bowel gas pattern. Visualized lung bases clear. Prior cholecystectomy. IMPRESSION: NG tube tip in the mid stomach. Electronically Signed   By: Charlett NoseKevin  Dover M.D.   On: 01/27/2018 18:50   Nm Pulmonary Perf And Vent  Result Date: 01/29/2018 CLINICAL DATA:  Suspected pulmonary embolus. Intermediate probability. Negative D-dimer. EXAM: NUCLEAR MEDICINE VENTILATION - PERFUSION LUNG SCAN TECHNIQUE: Ventilation images were obtained in multiple projections using inhaled aerosol Tc-6419m DTPA. Perfusion images were obtained in multiple projections after intravenous injection of Tc-7519m-MAA. RADIOPHARMACEUTICALS:  34.5 mCi of Tc-7119m DTPA aerosol inhalation and 4.3 mCi Tc919m-MAA IV COMPARISON:  Chest x-ray FINDINGS: Patchy ventilation perfusion matched defects with ventilation defects most more prominent than perfusion defects. This is a low probability for pulmonary embolus. IMPRESSION: Low  probability scan for pulmonary embolus. Electronically Signed   By: Maisie Fushomas  Register   On: 01/29/2018 13:26   [place] Venous Img Lower Bilateral  Result Date: 01/29/2018 CLINICAL DATA:  Bilateral lower extremity pain and edema for the past week. Evaluate for  DVT. EXAM: BILATERAL LOWER EXTREMITY VENOUS DOPPLER ULTRASOUND TECHNIQUE: Gray-scale sonography with graded compression, as well as color Doppler and duplex ultrasound were performed to evaluate the lower extremity deep venous systems from the level of the common femoral vein and including the common femoral, femoral, profunda femoral, popliteal and calf veins including the posterior tibial, peroneal and gastrocnemius veins when visible. The superficial great saphenous vein was also interrogated. Spectral Doppler was utilized to evaluate flow at rest and with distal augmentation maneuvers in the common femoral, femoral and popliteal veins. COMPARISON:  None. FINDINGS: RIGHT LOWER EXTREMITY Common Femoral Vein: No evidence of thrombus. Normal compressibility, respiratory phasicity and response to augmentation. Saphenofemoral Junction: No evidence of thrombus. Normal compressibility and flow on color Doppler imaging. Profunda Femoral Vein: No evidence of thrombus. Normal compressibility and flow on color Doppler imaging. Femoral Vein: No evidence of thrombus. Normal compressibility, respiratory phasicity and response to augmentation. Popliteal Vein: No evidence of thrombus. Normal compressibility, respiratory phasicity and response to augmentation. Calf Veins: No evidence of thrombus. Normal compressibility and flow on color Doppler imaging. Superficial Great Saphenous Vein: No evidence of thrombus. Normal compressibility. Venous Reflux:  None. Other Findings:  None. LEFT LOWER EXTREMITY Common Femoral Vein: No evidence of thrombus. Normal compressibility, respiratory phasicity and response to augmentation. Saphenofemoral Junction: No evidence of thrombus. Normal  compressibility and flow on color Doppler imaging. Profunda Femoral Vein: No evidence of thrombus. Normal compressibility and flow on color Doppler imaging. Femoral Vein: No evidence of thrombus. Normal compressibility, respiratory phasicity and response to augmentation. Popliteal Vein: No evidence of thrombus. Normal compressibility, respiratory phasicity and response to augmentation. Calf Veins: No evidence of thrombus. Normal compressibility and flow on color Doppler imaging. Superficial Great Saphenous Vein: No evidence of thrombus. Normal compressibility. Venous Reflux:  None. Other Findings:  None. IMPRESSION: No evidence DVT within either lower extremity. Electronically Signed   By: Simonne Come M.D.   On: 01/29/2018 10:46   Dg Chest Port 1 View  Result Date: 01/29/2018 CLINICAL DATA:  Shortness of breath. EXAM: PORTABLE CHEST 1 VIEW COMPARISON:  01/27/2018. FINDINGS: NG tube noted coiled stomach. Cardiomegaly scratched it stable cardiomegaly. No pulmonary venous congestion. Persistent worrisome pulmonary nodule left upper lobe. Again malignancy could present this fashion. Low lung volumes. Small left pleural effusion. No pneumothorax. IMPRESSION: 1.  NG tube noted coiled stomach. 2. Persistent left upper lobe nodule, again malignancy could present this fashion. Further evaluation suggested. 3.  Low lung volumes.  Small left pleural effusion. Electronically Signed   By: Maisie Fus  Register   On: 01/29/2018 10:00   [place] Ekg Site Rite  Result Date: 01/28/2018 If Site Rite image not attached, placement could not be confirmed due to current cardiac rhythm.    ASSESSMENT AND PLAN:   77 year old female with past medical history of diabetes, hypertension, hyperlipidemia, previous history of a neck abscess who presents to the hospital due to weakness, lethargy and malaise and noted to have severe sepsis secondary to UTI and also noted to be in acute diabetic ketoacidosis.  1.  Acute diabetic ketoacidosis-  secondary to underlying sepsis.  - much improved and AG closed. Off insulin gtt and electrolytes stable.  2.  Sepsis-patient has severe sepsis secondary to UTI. -Urine cultures are growing 100,000 colonies of E. coli.  Sensitivities pending.  Blood cultures growing coagulase-negative staph.  Repeat blood cultures are also positive but identification pending. -Continue empiric vancomycin, cefepime for now. - appreciate ID input.   3.  Urinary tract infection-source of  patient's sepsis. Urine culture + E. coli but sensitivities pending. - cont. IV Cefepime. -Patient had a renal ultrasound done which showed some scarring and a CT scan of the abdomen pelvis suggestive of bilateral hydronephrosis.  -Seen by urology in the setting of severe sepsis and possible pyelonephrosis of the left kidney patient is status post bilateral ureteral stent placement.  Patient's urine is clearing up, continue current care.  4.  Acute kidney injury secondary to severe sepsis and underlying UTI combined with concomitant use of diuretics. - Seen by nephrology, like in the setting of multifactorial issues.  Patient is not oliguric.  No acute indication for dialysis as per nephrology.  Continue treatment of underlying sepsis and UTI as mentioned above. - follow BUN/Cr. Renal dose meds and avoid nephrotoxins. Cr. Trending down.   5.  Leukocytosis- secondary to sepsis/UTI. - improving w/ abx and treatment of underlying sepsis.    6.  Essential hypertension- cont. To hold anti-HTN for now.   7. Anemia - likely anemia of chronic disease and will cont. To follow hg.  - no need for transfusion.   All the records are reviewed and case discussed with Care Management/Social Worker. Management plans discussed with the patient, family and they are in agreement.  CODE STATUS: Full code  DVT Prophylaxis: Hep SQ  TOTAL TIME TAKING CARE OF THIS PATIENT: 30 minutes.   POSSIBLE D/C IN 2-3 DAYS, DEPENDING ON CLINICAL  CONDITION.   Houston Siren M.D on 01/29/2018 at 3:39 PM  Between 7am to 6pm - Pager - [phone]  After 6pm go to [email or web address] - Social research officer, government  [facility]  Office  [phone]  CC: Primary care physician; Jaclyn Shaggy, MD

## 2018-01-29 NOTE — Progress Notes (Signed)
Pt given one bite of applesauce and started gagging, but did not vomit. Pt being transferred to nuclear medicine, will try for more upon returning.

## 2018-01-29 NOTE — Progress Notes (Signed)
[facility], [place] 01/29/18  Subjective:   Remains critically ill B/l ureteral stents placed yesterday Left pyonephrosis suspected Patient nauseus  Objective:  Vital signs in last 24 hours:  Temp:  [97.6 F (36.4 C)-99 F (37.2 C)] 97.6 F (36.4 C) (09/24 0802) Pulse Rate:  [100-144] 119 (09/24 0900) Resp:  [8-32] 21 (09/24 0900) BP: (104-154)/(53-99) 125/62 (09/24 0900) SpO2:  [90 %-100 %] 98 % (09/24 0802) FiO2 (%):  [21 %] 21 % (09/24 0435)  Weight change:  Filed Weights   01/27/18 0840 01/27/18 1122  Weight: 54.4 kg 57.4 kg    Intake/Output:    Intake/Output Summary (Last 24 hours) at 01/29/2018 1022 Last data filed at 01/29/2018 0700 Gross per 24 hour  Intake 2472.54 ml  Output 957 ml  Net 1515.54 ml     Physical Exam: General: Critically ill appearing  HEENT Dry oral mucus membranes  Neck supple  Pulm/lungs Decreased breath sounds at bases  CVS/Heart Tachycardic, irregular  Abdomen:  Soft, NT  Extremities: No edema  Neurologic: Alert, follows few simple commands  Skin: warm   Foley in place       Basic Metabolic Panel:  Recent Labs  Lab 01/27/18 1905  01/28/18 0839 01/28/18 1313 01/28/18 1627 01/28/18 2050 01/29/18 0432  NA 140   < > 148* 143 146* 144 146*  K 3.2*   < > 3.2* 4.3 3.9 3.9 4.7  CL 117*   < > 114* 115* 114* 116* 117*  CO2 <7*   < > 20* 15* 19* 18* 17*  GLUCOSE 203*   < > 159* 317* 224* 127* 256*  BUN 83*   < > 71* 72* 68* 67* 59*  CREATININE 2.60*   < > 2.44* 2.62* 2.46* 2.42* 2.31*  CALCIUM 8.0*   < > 8.0* 7.6* 7.8* 7.4* 7.6*  MG 2.1  --  2.0 1.8  --   --  1.6*  PHOS 4.7*  --  1.6*  --   --   --  2.2*   < > = values in this interval not displayed.     CBC: Recent Labs  Lab 01/27/18 0845 01/27/18 1905 01/28/18 0257 01/29/18 0432  WBC 22.3*  --  14.1* 12.0*  NEUTROABS 19.5*  --   --  9.7*  HGB 11.7* 10.2* 9.8* 8.8*  HCT 37.2 32.0* 30.2* 27.1*  MCV 97.1  --  92.5 93.3  PLT 491*  --   327 271     No results found for: HEPBSAG, HEPBSAB, HEPBIGM    Microbiology:  Recent Results (from the past 240 hour(s))  Blood Culture (routine x 2)     Status: None (Preliminary result)   Collection Time: 01/27/18  8:40 AM  Result Value Ref Range Status   Specimen Description   Final    BLOOD RIGHT ANTECUBITAL Performed at [facility], [address], [place], [place] [address]    Special Requests   Final    BOTTLES DRAWN AEROBIC AND ANAEROBIC Blood Culture adequate volume Performed at [facility], [address] [place], [place] [address]    Culture  Setup Time   Final    Organism ID to follow AEROBIC BOTTLE ONLY GRAM POSITIVE COCCI CRITICAL RESULT CALLED TO, READ BACK BY AND VERIFIED WITH: MATT [facility] 01/28/18 0618 REC Performed at [facility], [address] [place], [place] [address]    Culture   Final    GRAM POSITIVE COCCI IDENTIFICATION TO FOLLOW Performed at  [facility], [address] [place], [place] [address]    Report Status PENDING  Incomplete  Blood Culture ID Panel (Reflexed)     Status: Abnormal   Collection Time: 01/27/18  8:40 AM  Result Value Ref Range Status   Enterococcus species NOT DETECTED NOT DETECTED Final   Listeria monocytogenes NOT DETECTED NOT DETECTED Final   Staphylococcus species DETECTED (A) NOT DETECTED Final    Comment: Methicillin (oxacillin) resistant coagulase negative staphylococcus. Possible blood culture contaminant (unless isolated from more than one blood culture draw or clinical case suggests pathogenicity). No antibiotic treatment is indicated for blood  culture contaminants. CRITICAL RESULT CALLED TO, READ BACK BY AND VERIFIED WITH: MATT MCBANE 01/28/18 0618 REC    Staphylococcus aureus NOT DETECTED NOT DETECTED Final   Methicillin resistance DETECTED (A) NOT DETECTED Final    Comment: CRITICAL RESULT CALLED TO, READ BACK BY AND VERIFIED WITH: MATT MCBANE 01/28/18 0618 REC     Streptococcus species NOT DETECTED NOT DETECTED Final   Streptococcus agalactiae NOT DETECTED NOT DETECTED Final   Streptococcus pneumoniae NOT DETECTED NOT DETECTED Final   Streptococcus pyogenes NOT DETECTED NOT DETECTED Final   Acinetobacter baumannii NOT DETECTED NOT DETECTED Final   Enterobacteriaceae species NOT DETECTED NOT DETECTED Final   Enterobacter cloacae complex NOT DETECTED NOT DETECTED Final   Escherichia coli NOT DETECTED NOT DETECTED Final   Klebsiella oxytoca NOT DETECTED NOT DETECTED Final   Klebsiella pneumoniae NOT DETECTED NOT DETECTED Final   Proteus species NOT DETECTED NOT DETECTED Final   Serratia marcescens NOT DETECTED NOT DETECTED Final   Haemophilus influenzae NOT DETECTED NOT DETECTED Final   Neisseria meningitidis NOT DETECTED NOT DETECTED Final   Pseudomonas aeruginosa NOT DETECTED NOT DETECTED Final   Candida albicans NOT DETECTED NOT DETECTED Final   Candida glabrata NOT DETECTED NOT DETECTED Final   Candida krusei NOT DETECTED NOT DETECTED Final   Candida parapsilosis NOT DETECTED NOT DETECTED Final   Candida tropicalis NOT DETECTED NOT DETECTED Final    Comment: Performed at [facility], [address] [place], [place] [address]  Blood Culture (routine x 2)     Status: None (Preliminary result)   Collection Time: 01/27/18  8:46 AM  Result Value Ref Range Status   Specimen Description   Final    BLOOD LEFT ANTECUBITAL Performed at [facility], [address], [place], [place] [address]    Special Requests   Final    BOTTLES DRAWN AEROBIC AND ANAEROBIC Blood Culture adequate volume Performed at [facility], [address] [place], [place] [address]    Culture  Setup Time   Final    GRAM POSITIVE COCCI AEROBIC BOTTLE ONLY CRITICAL VALUE NOTED.  VALUE IS CONSISTENT WITH PREVIOUSLY REPORTED AND CALLED VALUE. Performed at [facility], [address], [place], [place] [address]    Culture   Final     Romie Minus POSITIVE COCCI CULTURE REINCUBATED FOR BETTER GROWTH Performed at [facility], [address] [place], [place] [address]    Report Status PENDING  Incomplete  Urine Culture     Status: Abnormal (Preliminary result)   Collection Time: 01/27/18  9:45 AM  Result Value Ref Range Status   Specimen Description   Final    URINE, CATHETERIZED Performed at [facility], [address], [place], [place] [address]    Special Requests   Final    NONE Performed at [facility], [address] [place], [place] [address]    Culture (A)  Final    >=100,000 COLONIES/mL GRAM NEGATIVE RODS CULTURE REINCUBATED FOR BETTER GROWTH Performed at [facility], [address] [place], [place] [address]    Report Status PENDING  Incomplete  MRSA PCR Screening     Status: None   Collection Time: 01/27/18 11:25 AM  Result Value Ref Range Status   MRSA by PCR NEGATIVE NEGATIVE Final    Comment:        The GeneXpert MRSA Assay (FDA approved for NASAL specimens only), is one component of a comprehensive MRSA colonization surveillance program. It is not intended to diagnose MRSA infection nor to guide or monitor treatment for MRSA infections. Performed at [facility], [address] [place], [place] [address]   C difficile quick scan w PCR reflex     Status: None   Collection Time: 01/28/18  1:14 PM  Result Value Ref Range Status   C Diff antigen NEGATIVE NEGATIVE Final   C Diff toxin NEGATIVE NEGATIVE Final   C Diff interpretation No C. difficile detected.  Final    Comment: Performed at [facility], [address], [place], [place] [address]  Urine Culture     Status: None (Preliminary result)   Collection Time: 01/28/18  6:31 PM  Result Value Ref Range Status   Specimen Description   Final    URINE, RANDOM Performed at [facility], [address] [place], [place] [address]    Special Requests   Final    RIGHT RENAL  PELVIS Performed at [facility], [address], [place], [place] [address]    Culture PENDING  Incomplete   Report Status PENDING  Incomplete  Urine Culture     Status: None (Preliminary result)   Collection Time: 01/28/18  6:33 PM  Result Value Ref Range Status   Specimen Description   Final    URINE, RANDOM Performed at [facility], [address], [place], [place] [address]    Special Requests   Final    LEFT KIDNEY Performed at [facility], [address] [place], [place] [address]    Culture PENDING  Incomplete   Report Status PENDING  Incomplete    Coagulation Studies: Recent Labs    01/27/18 0845 01/29/18 0432  LABPROT 14.9 14.9  INR 1.18 1.18    Urinalysis: Recent Labs    01/27/18 0945  COLORURINE YELLOW*  LABSPEC 1.026  PHURINE 7.0  GLUCOSEU NEGATIVE  HGBUR SMALL*  BILIRUBINUR NEGATIVE  KETONESUR 5*  PROTEINUR 30*  NITRITE NEGATIVE  LEUKOCYTESUR SMALL*      Imaging: Ct Abdomen Pelvis Wo Contrast  Result Date: 01/28/2018 CLINICAL DATA:  Inpatient. Elevated lipase. Severe nausea and vomiting. Abdominal pain. Prior hysterectomy. EXAM: CT ABDOMEN AND PELVIS WITHOUT CONTRAST TECHNIQUE: Multidetector CT imaging of the abdomen and pelvis was performed following the standard protocol without IV contrast. COMPARISON:  01/27/2018 abdominal radiograph. 09/19/2011 CT abdomen/pelvis. FINDINGS: Lower chest: Right middle lobe 7 mm solid pulmonary nodule (series 3/image 6), increased from 5 mm in 2013 CT. Coronary atherosclerosis. Hepatobiliary: Normal liver size. No liver mass. Cholecystectomy. No biliary ductal dilatation. CBD diameter 5 mm. Pancreas: Stable coarse uncinate process pancreatic calcification, nonspecific, possibly vascular. No pancreatic mass or duct dilation. No significant peripancreatic fat stranding or fluid. Spleen: Normal size spleen. Bandlike low-attenuation focus in the lower spleen (series 2/image 20), new.  Adrenals/Urinary Tract: Normal adrenals. Chronic severe asymmetric atrophy of  the left kidney. Moderate bilateral hydroureteronephrosis to the level of the ureterovesical junction bilaterally. Hyperdense 1.1 cm anterior upper right renal cortical lesion, stable in size since 2013 although with new hyperdensity. Exophytic simple 1.1 cm posterior lower left renal cyst. No additional contour deforming renal lesions. No urolithiasis. Bladder is partially collapsed by indwelling Foley catheter with nondependent bladder gas compatible with instrumentation. No definite bladder wall thickening. Stomach/Bowel: Enteric tube terminates in the distal stomach. Collapsed and grossly normal stomach. Normal caliber small bowel with no small bowel wall thickening. Normal appendix. Normal large bowel with no diverticulosis, large bowel wall thickening or pericolonic fat stranding. Vascular/Lymphatic: Atherosclerotic nonaneurysmal abdominal aorta. No pathologically enlarged lymph nodes in the abdomen or pelvis. Reproductive: Status post hysterectomy, with no abnormal findings at the vaginal cuff. No adnexal mass. Other: No pneumoperitoneum, ascites or focal fluid collection. Moderate fat containing infraumbilical ventral abdominal hernia. Musculoskeletal: No aggressive appearing focal osseous lesions. Mild thoracolumbar spondylosis. Scattered subcentimeter sclerotic osseous lesions in sacrum and bilateral pelvic girdle are similar to prior CT, probably benign bone islands. IMPRESSION: 1. Moderate bilateral hydroureteronephrosis to the level of the UVJ bilaterally, despite the presence of an indwelling Foley catheter, which only partially collapses the bladder. No evidence of obstructing urolithiasis or mass on this noncontrast scan. 2. No convincing noncontrast CT findings of acute pancreatitis. 3. Bandlike low-attenuation focus in the inferior spleen, poorly characterized on this noncontrast scan, new since 2013 CT. Cannot exclude  splenic infarct or mass. Suggest attention on follow-up MRI or CT abdomen in 3-6 months. 4. Enteric tube terminates in the distal stomach. No evidence of bowel obstruction or acute bowel inflammation. 5. Right middle lobe 7 mm solid pulmonary nodule, mildly increased in size since 2013 CT. This nodule remains below PET resolution. Suggest follow-up chest CT in 6 months. 6. Moderate fat containing infraumbilical ventral abdominal hernia. 7.  Aortic Atherosclerosis (ICD10-I70.0). Electronically Signed   By: Delbert PhenixJason A Poff M.D.   On: 01/28/2018 02:54   Dg Abd 1 View  Result Date: 01/27/2018 CLINICAL DATA:  NG tube placement EXAM: ABDOMEN - 1 VIEW COMPARISON:  01/27/2018 FINDINGS: NG tube coils in the stomach with the tip in the fundus. Nonobstructive bowel gas pattern. Prior cholecystectomy. IMPRESSION: NG tube coils in the stomach with the tip in the fundus. Electronically Signed   By: Charlett NoseKevin  Dover M.D.   On: 01/27/2018 19:14   Dg Abd 1 View  Result Date: 01/27/2018 CLINICAL DATA:  NG tube placement EXAM: ABDOMEN - 1 VIEW COMPARISON:  01/27/2018 FINDINGS: NG tube is in the stomach. Nonobstructive bowel gas pattern. Visualized lung bases clear. Prior cholecystectomy. IMPRESSION: NG tube tip in the mid stomach. Electronically Signed   By: Charlett NoseKevin  Dover M.D.   On: 01/27/2018 18:50   [place] Renal  Result Date: 01/27/2018 CLINICAL DATA:  Acute renal failure EXAM: RENAL / URINARY TRACT ULTRASOUND COMPLETE COMPARISON:  CT abdomen/pelvis dated 09/19/2011 FINDINGS: Right Kidney: Length: 10.5 cm. Echogenic renal parenchyma. 16 x 14 x 10 mm lower pole cyst. No hydronephrosis. Left Kidney: Length: 8.6 cm. Cortical scarring with lobular parenchyma. Echogenic renal parenchyma. Stable hydronephrosis versus extrarenal pelvis. Bladder: Within normal limits. IMPRESSION: Echogenic renal parenchyma, suggesting medical renal disease. Left renal cortical scarring with stable hydronephrosis versus extrarenal pelvis, chronic. This  appearance is unchanged from 2013. 1.6 cm right lower pole renal cyst, simple. Electronically Signed   By: Charline BillsSriyesh  Krishnan M.D.   On: 01/27/2018 12:41   Dg Chest Port 1 View  Result Date: 01/29/2018 CLINICAL  DATA:  Shortness of breath. EXAM: PORTABLE CHEST 1 VIEW COMPARISON:  01/27/2018. FINDINGS: NG tube noted coiled stomach. Cardiomegaly scratched it stable cardiomegaly. No pulmonary venous congestion. Persistent worrisome pulmonary nodule left upper lobe. Again malignancy could present this fashion. Low lung volumes. Small left pleural effusion. No pneumothorax. IMPRESSION: 1.  NG tube noted coiled stomach. 2. Persistent left upper lobe nodule, again malignancy could present this fashion. Further evaluation suggested. 3.  Low lung volumes.  Small left pleural effusion. Electronically Signed   By: Maisie Fus  Register   On: 01/29/2018 10:00   [place] Ekg Site Rite  Result Date: 01/28/2018 If Site Rite image not attached, placement could not be confirmed due to current cardiac rhythm.    Medications:   . sodium chloride    . ceFEPime (MAXIPIME) IV 1 g (01/29/18 0815)  . famotidine (PEPCID) IV Stopped (01/28/18 2145)  . magnesium sulfate 1 - 4 g bolus IVPB 2 g (01/29/18 1017)  . sodium chloride    . vancomycin     . chlorhexidine  15 mL Mouth Rinse BID  . heparin  5,000 Units Subcutaneous Q8H  . insulin aspart  0-9 Units Subcutaneous Q4H  . insulin glargine  14 Units Subcutaneous QHS  . mouth rinse  15 mL Mouth Rinse q12n4p  . metoCLOPramide (REGLAN) injection  5 mg Intravenous Q6H  . metoprolol tartrate  2.5 mg Intravenous Q6H  . metoprolol tartrate  2.5 mg Intravenous Once   acetaminophen **OR** acetaminophen, promethazine  Assessment/ Plan:  77 y.o. caucasian female with medical problems of Diabetes, HTN, HLD, salivary gland stones who was admitted to [facility] on 01/27/2018 for evaluation of generalized fatigue and is Dx with severe sepsis, UTI and ARF.  1. ARF, multifactorial 2. B/l  hydronephrosis- b/l ureteral stent placed 9/23 by Dr Annabell Howells 3. Sepsis, Gram positive bacteremia 4. Acidosis, Hypernatremia, Hypokalemia 5. UTI- Gram neg rods 6. CKD st 3, Baseline Cr 1.38/ GFR 36 from jan 2019 7. DM-2 with CKD 8. New onset A Fib 9. Enlarging LUL nodule 10. 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 Patient is s/p b/l ureteral stent placement. Left pyonephrosis is suspected. S Creatinine about the same UOP 965 cc Electrolyte correction as per ICU team No acute indication of HD at present Monitor vanc levels in collaboration with pharmacist, avoid nephrotoxins, hypotension Treatment of sepsis as per ICU team Will follow   LOS: 2 Milind Raether Thedore Mins 9/24/201910:22 AM  [facility] [place], [place] [phone]  Note: This note was prepared with Dragon dictation. Any transcription errors are unintentional

## 2018-01-29 NOTE — Telephone Encounter (Signed)
Will you please make sure patient has a follow up appointment with one of the physicians once discharged?

## 2018-01-29 NOTE — Progress Notes (Signed)
Pt back from nuclear medicine and requesting water. Gave pt a sip of water and she gagged like she was going to vomit immediately after but ended up just spitting into bag. Gave pt some Zofran and is now resting comfortably, will continue to monitor.

## 2018-01-29 NOTE — Progress Notes (Signed)
Urology Consult Follow Up  Subjective: POD # 1 s/p cystoscopy with bilateral ureteral stent placement  Patient's husband is at bedside.  His questions and concerns are addressed.  Plans going forward were discussed.    Creatinine 2.31 down from 2.62 at admission.  WBC count 12.0 down from 22.3 at admission.  Culture from left kidney is pending.   Blood cultures positive MRSA.  Urine culture prelimary gram negative rods.    Afebrile, still tachycardic UOP good with clear yellow urine this am  Patient denies any pain.    Anti-infectives: Anti-infectives (From admission, onward)   Start     Dose/Rate Route Frequency Ordered Stop   01/28/18 1800  vancomycin (VANCOCIN) IVPB 750 mg/150 ml premix     750 mg 150 mL/hr over 60 Minutes Intravenous Every 48 hours 01/28/18 0628     01/28/18 0800  ceFEPIme (MAXIPIME) 1 g in sodium chloride 0.9 % 100 mL IVPB     1 g 200 mL/hr over 30 Minutes Intravenous Every 24 hours 01/27/18 1019     01/27/18 0930  vancomycin (VANCOCIN) IVPB 1000 mg/200 mL premix     1,000 mg 200 mL/hr over 60 Minutes Intravenous  Once 01/27/18 0923 01/27/18 1115   01/27/18 0845  ceFEPIme (MAXIPIME) 2 g in sodium chloride 0.9 % 100 mL IVPB     2 g 200 mL/hr over 30 Minutes Intravenous  Once 01/27/18 1914 01/27/18 0959      Current Facility-Administered Medications  Medication Dose Route Frequency Provider Last Rate Last Dose  . acetaminophen (TYLENOL) tablet 650 mg  650 mg Oral Q6H PRN Bjorn Pippin, MD       Or  . acetaminophen (TYLENOL) suppository 650 mg  650 mg Rectal Q6H PRN Bjorn Pippin, MD      . ceFEPIme (MAXIPIME) 1 g in sodium chloride 0.9 % 100 mL IVPB  1 g Intravenous Q24H Bjorn Pippin, MD 200 mL/hr at 01/29/18 0815 1 g at 01/29/18 0815  . chlorhexidine (PERIDEX) 0.12 % solution 15 mL  15 mL Mouth Rinse BID Bjorn Pippin, MD   15 mL at 01/28/18 2116  . dextrose 5 % and 0.45 % NaCl with KCl 40 mEq/L infusion   Intravenous Continuous Bjorn Pippin, MD 100 mL/hr at  01/29/18 0237    . famotidine (PEPCID) IVPB 20 mg premix  20 mg Intravenous Q12H Bjorn Pippin, MD   Stopped at 01/28/18 2145  . heparin injection 5,000 Units  5,000 Units Subcutaneous Q8H Bjorn Pippin, MD   5,000 Units at 01/29/18 0543  . insulin aspart (novoLOG) injection 0-9 Units  0-9 Units Subcutaneous Q4H Eugenie Norrie, NP   3 Units at 01/29/18 0800  . insulin glargine (LANTUS) injection 10 Units  10 Units Subcutaneous QHS Eugenie Norrie, NP      . MEDLINE mouth rinse  15 mL Mouth Rinse q12n4p Bjorn Pippin, MD   15 mL at 01/28/18 1607  . metoCLOPramide (REGLAN) injection 5 mg  5 mg Intravenous Q6H Bjorn Pippin, MD   5 mg at 01/29/18 0543  . metoprolol tartrate (LOPRESSOR) injection 2.5 mg  2.5 mg Intravenous Q6H Eugenie Norrie, NP   2.5 mg at 01/29/18 0739  . promethazine (PHENERGAN) injection 12.5 mg  12.5 mg Intravenous Q6H PRN Bjorn Pippin, MD   12.5 mg at 01/28/18 1041  . sodium chloride 0.9 % bolus 750 mL  750 mL Intravenous Once Bjorn Pippin, MD      . vancomycin (VANCOCIN) IVPB 750 mg/150 ml premix  750 mg Intravenous Q48H Bjorn Pippin, MD         Objective: Vital signs in last 24 hours: Temp:  [97.6 F (36.4 C)-99 F (37.2 C)] 97.6 F (36.4 C) (09/24 0802) Pulse Rate:  [100-144] 123 (09/24 0700) Resp:  [8-32] 22 (09/24 0700) BP: (104-154)/(53-99) 147/81 (09/24 0700) SpO2:  [90 %-100 %] 98 % (09/24 0802) FiO2 (%):  [21 %] 21 % (09/24 0435)  Intake/Output from previous day: 09/23 0701 - 09/24 0700 In: 2472.5 [I.V.:2422.5; IV Piggyback:50] Out: 992 [Urine:965; Emesis/NG output:25; Stool:2] Intake/Output this shift: No intake/output data recorded.   Physical Exam Constitutional: Well nourished. Alert and oriented, No acute distress. HEENT: [place] AT, moist mucus membranes. Trachea midline, no masses.  NG tube in place.   Cardiovascular: No clubbing, cyanosis, or edema. Respiratory: Normal respiratory effort, no increased work of breathing. GI: Abdomen is soft, non  tender, non distended, no abdominal masses. Liver and spleen not palpable.  No hernias appreciated.  Stool sample for occult testing is not indicated.   GU: No CVA tenderness.  No bladder fullness or masses.   Skin: Left leg ulcer  Lymph: No cervical or inguinal adenopathy. Neurologic: Grossly intact, no focal deficits, moving all 4 extremities. Psychiatric: Normal mood and affect.  Lab Results:  Recent Labs    01/28/18 0257 01/29/18 0432  WBC 14.1* 12.0*  HGB 9.8* 8.8*  HCT 30.2* 27.1*  PLT 327 271   BMET Recent Labs    01/28/18 2050 01/29/18 0432  NA 144 146*  K 3.9 4.7  CL 116* 117*  CO2 18* 17*  GLUCOSE 127* 256*  BUN 67* 59*  CREATININE 2.42* 2.31*  CALCIUM 7.4* 7.6*   PT/INR Recent Labs    01/27/18 0845 01/29/18 0432  LABPROT 14.9 14.9  INR 1.18 1.18   ABG Recent Labs    01/28/18 2153 01/29/18 0454  PHART 7.41 7.38  HCO3 17.1* 17.2*    Studies/Results: Ct Abdomen Pelvis Wo Contrast  Result Date: 01/28/2018 CLINICAL DATA:  Inpatient. Elevated lipase. Severe nausea and vomiting. Abdominal pain. Prior hysterectomy. EXAM: CT ABDOMEN AND PELVIS WITHOUT CONTRAST TECHNIQUE: Multidetector CT imaging of the abdomen and pelvis was performed following the standard protocol without IV contrast. COMPARISON:  01/27/2018 abdominal radiograph. 09/19/2011 CT abdomen/pelvis. FINDINGS: Lower chest: Right middle lobe 7 mm solid pulmonary nodule (series 3/image 6), increased from 5 mm in 2013 CT. Coronary atherosclerosis. Hepatobiliary: Normal liver size. No liver mass. Cholecystectomy. No biliary ductal dilatation. CBD diameter 5 mm. Pancreas: Stable coarse uncinate process pancreatic calcification, nonspecific, possibly vascular. No pancreatic mass or duct dilation. No significant peripancreatic fat stranding or fluid. Spleen: Normal size spleen. Bandlike low-attenuation focus in the lower spleen (series 2/image 20), new. Adrenals/Urinary Tract: Normal adrenals. Chronic severe  asymmetric atrophy of the left kidney. Moderate bilateral hydroureteronephrosis to the level of the ureterovesical junction bilaterally. Hyperdense 1.1 cm anterior upper right renal cortical lesion, stable in size since 2013 although with new hyperdensity. Exophytic simple 1.1 cm posterior lower left renal cyst. No additional contour deforming renal lesions. No urolithiasis. Bladder is partially collapsed by indwelling Foley catheter with nondependent bladder gas compatible with instrumentation. No definite bladder wall thickening. Stomach/Bowel: Enteric tube terminates in the distal stomach. Collapsed and grossly normal stomach. Normal caliber small bowel with no small bowel wall thickening. Normal appendix. Normal large bowel with no diverticulosis, large bowel wall thickening or pericolonic fat stranding. Vascular/Lymphatic: Atherosclerotic nonaneurysmal abdominal aorta. No pathologically enlarged lymph nodes in the abdomen or pelvis. Reproductive:  Status post hysterectomy, with no abnormal findings at the vaginal cuff. No adnexal mass. Other: No pneumoperitoneum, ascites or focal fluid collection. Moderate fat containing infraumbilical ventral abdominal hernia. Musculoskeletal: No aggressive appearing focal osseous lesions. Mild thoracolumbar spondylosis. Scattered subcentimeter sclerotic osseous lesions in sacrum and bilateral pelvic girdle are similar to prior CT, probably benign bone islands. IMPRESSION: 1. Moderate bilateral hydroureteronephrosis to the level of the UVJ bilaterally, despite the presence of an indwelling Foley catheter, which only partially collapses the bladder. No evidence of obstructing urolithiasis or mass on this noncontrast scan. 2. No convincing noncontrast CT findings of acute pancreatitis. 3. Bandlike low-attenuation focus in the inferior spleen, poorly characterized on this noncontrast scan, new since 2013 CT. Cannot exclude splenic infarct or mass. Suggest attention on follow-up  MRI or CT abdomen in 3-6 months. 4. Enteric tube terminates in the distal stomach. No evidence of bowel obstruction or acute bowel inflammation. 5. Right middle lobe 7 mm solid pulmonary nodule, mildly increased in size since 2013 CT. This nodule remains below PET resolution. Suggest follow-up chest CT in 6 months. 6. Moderate fat containing infraumbilical ventral abdominal hernia. 7.  Aortic Atherosclerosis (ICD10-I70.0). Electronically Signed   By: Delbert Phenix M.D.   On: 01/28/2018 02:54   Dg Abd 1 View  Result Date: 01/27/2018 CLINICAL DATA:  NG tube placement EXAM: ABDOMEN - 1 VIEW COMPARISON:  01/27/2018 FINDINGS: NG tube coils in the stomach with the tip in the fundus. Nonobstructive bowel gas pattern. Prior cholecystectomy. IMPRESSION: NG tube coils in the stomach with the tip in the fundus. Electronically Signed   By: Charlett Nose M.D.   On: 01/27/2018 19:14   Dg Abd 1 View  Result Date: 01/27/2018 CLINICAL DATA:  NG tube placement EXAM: ABDOMEN - 1 VIEW COMPARISON:  01/27/2018 FINDINGS: NG tube is in the stomach. Nonobstructive bowel gas pattern. Visualized lung bases clear. Prior cholecystectomy. IMPRESSION: NG tube tip in the mid stomach. Electronically Signed   By: Charlett Nose M.D.   On: 01/27/2018 18:50   US Renal  Result Date: 01/27/2018 CLINICAL DATA:  Acute renal failure EXAM: RENAL / URINARY TRACT ULTRASOUND COMPLETE COMPARISON:  CT abdomen/pelvis dated 09/19/2011 FINDINGS: Right Kidney: Length: 10.5 cm. Echogenic renal parenchyma. 16 x 14 x 10 mm lower pole cyst. No hydronephrosis. Left Kidney: Length: 8.6 cm. Cortical scarring with lobular parenchyma. Echogenic renal parenchyma. Stable hydronephrosis versus extrarenal pelvis. Bladder: Within normal limits. IMPRESSION: Echogenic renal parenchyma, suggesting medical renal disease. Left renal cortical scarring with stable hydronephrosis versus extrarenal pelvis, chronic. This appearance is unchanged from 2013. 1.6 cm right lower pole  renal cyst, simple. Electronically Signed   By: Charline Bills M.D.   On: 01/27/2018 12:41   [place] Ekg Site Rite  Result Date: 01/28/2018 If Site Rite image not attached, placement could not be confirmed due to current cardiac rhythm.    Assessment and Plan: 77 year old female with a medical history of diabetes admitted for sepsis secondary to UTI who is status post cystoscopy with bilateral ureteral stent placement for bilateral hydronephrosis  1. Bladder lesions worrisome for squamous cell carcinoma Patient will need cystoscopy and bladder biopsy on an outpatient basis for further evaluation of these lesions  2. Bilateral hydronephrosis Status post bilateral ureteral stent placement Likely due to refluxing due to small contracted bladder Patient will need outpatient management of ureteral stents  Urology to sign off at this time.  Follow up appointment to be made once patient is stable and discharged from the hospital.  LOS: 2 days    [id number] 01/29/2018

## 2018-01-29 NOTE — Progress Notes (Signed)
Lahoma Rocker, M.D Pulmonary Critical Care & Sleep Medicine   ICU Patient Progress Note  Name: Tiffany Manning  DOB: 12-16-1940  MRN: [id number]  Date: 01/29/2018   [x] I have reviewed the flowsheet and previous day's notes.  IMPRESSION:  Patient Active Problem List   Diagnosis Date Noted  . Severe sepsis ([place]) 01/27/2018  . Pressure injury of skin 01/27/2018  . 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 Protein-calorie malnutrition, severe 05/29/2017  . Acute renal failure ([place]) 05/28/2017  . Acute renal failure (ARF) ([place]) 05/28/2017  1, Severe sepsis with lactic acidosis related to UTI/urosepsis Blood and urine c/s.-Coagulation negative staph with GNR in the urine broad spectrum abx, cefepime and vancomycin Pending repeat culture Infectious disease and urology following Not requiring any pressors  2.Acute renal failure, ATN vs obstruction,  -- K replaced --Magnesium is 1.6 and replaced -- Monitor CK-178 --Monitor BMP and magnesium  3. DKA , improved currently on Lantus and sliding scale coverage  4. Elevated Lipase most likely due to stress -- CT abd not suggestive of any pancreatitis -- Will need out patient eval on splenic abnormality  5.CARDIOVASCULAR -- Afib, ? New onset, TSH and free T4 is okay, Give IV fluids, Echo pending official report, Cardiology consult, AC per cardiology-okay to start from ICU point of view - Pending report per cardiology suggested patient might have pulmonary hypertension-will consider DVT study and VQ scan  6.GASTROINTESTINAL N/V related to DKA, improved, DC NG tube, start p.o. diet   7.NEUROLOGIC, no defecits  8.  Started on low-dose beta-blocker if able to take p.o. switch to p.o. beta-blocker  9. Enlarging LUL nodule, will need to be addressed as outpt PET CT vs CT FNA vs resection  PLAN:  VQX:IHWT Cardiology consult, metoprolol 2.5 every 6 IV started, once able to take p.o. switch to p.o.  RS:On RA doing well -- Upper lobe  nodules seen on 2015 CT at [place] [facility] also showed LUL spiculated nodule which will need out patient follow up -- Aspiration precaution UU:EKCMK and cefepime adjust per ID recommendation -- Follow culture and adjust - Repeat cultures ordered for today ENDO:Anion gap is closed and started on Lantus and subcu insulin -- SSI monitor blood sugar LK:JZPHXT is improved-start p.o. diet, continue Pepcid RENAL:Cr improving, Urology consult appreciated- on the report this suggested possibility of malignancy will defer further work-up to urology - Renal consult appreciated - DC D5 containing fluid start on half-normal at 75, if continue to have issues with some hypoxia will consider large dose Lasix -- Electrolyte replacement protocol, Monitor Cr and K  CNS:no issues  HEMATOLOGY:monitor hb -- Monitor HB and PLT MUSCULOSKELETAL CK is okay PAIN AND SEDATION:PRN meds  Skin/Wound: Chronich changes   Electrolytes: Replace electrolytes per ICU electrolyte replacement protocol.   IVF: Half-normal saline at 75  Nutrition: Start diet  Prophylaxis: DVT Prophylaxis with heparin,. GI Prophylaxis.   Restraints: none  PT/OT eval and treat. OOB when appropriate.   Lines/Tubes:  9/22 foley no central line ADVANCE DIRECTIVE:full code  FAMILY DISCUSSION:spoke with husband at length Quality Care: PPI, DVT prophylaxis, HOB elevated, Infection control all reviewed and addressed. Events and notes from last 24 hours reviewed. Care plan discussed on multidisciplinary rounds CC TIME: 35 min   Overall patient is doing well, only acute issue going on his A. fib, cardiology is following, currently not on any pressors on room air, sepsis seems to be under control but it is getting better, patient can be transferred out of ICU  to telemetry floor. OT and PT consult is called   Subjective:  HISTORY OF PRESENT ILLNESS    [name] a77 y.o.femalewith a known history of diabetes, hypertension, hyperlipidemia  who presents to the ER due to generalized weakness fatigue and just not feeling well over the past week. Patient has had significant malaise and just generalized weakness over the past week progressively getting worse went to see her primary care physician and was placed on oral antibiotic( nitrofurantoin as per pt) for suspected UTI. She continued to feel poorly and was not getting better and felt worse today and decided to come to ER. She has been nauseated and has been throwing up and unable to keep anything down. In the ER she was noted to be hypotensive, tachycardic, noted to have a leukocytosis suspected to have severe sepsis secondary to UTI and hospitalist services were contacted for admission. . In the emergency room patient was noted to be in acute kidney injury with severe hyperglycemia, and noted to be acidotic. She was assesed to have DKA, severe sepsis, ARF and UTI and transferred to ICU. In the ICU she cont to feel poorly with severe weakness with ongoing nausea, has not vomited so for in the icu.she denies scough sob or CP. She denies fever but reports chills over the last week. An in/out foley in ER had revealed purulent urine  Antimicrobials this admission: 9/22 Vancomycin >>  9/22 Cefepime >>   Microbiology results: 9/22 BCx: Coag negative staph 9/22 UCx: GNR  Subjective: Status post bilateral stent placement by urology, abnormality reported around the bladder concerning for malignancy -A. fib with RVR, cardiology consult is called - Blood sugar/anion gap is improved transition to Lantus and subcu insulin Cr is improving 4 L positive Denies any nausea or any other complaint wants to eat - Cardiology suggested echo showing pulmonary hypertension, pending official report  Significant event: 9-22 admission 9-22 CT abd showed no pancreatitis, ? Splenic band like densitiy, RML nodule looks slightly bigger than 2013 923: Status post bilateral stent placement 923: ID consult  space- continue same antibiotics, nephrology suggested to follow along, 01/29/2018: A. fib with RVR echo done pending results, cardiology consult is called  ALLERGIES: Allergies  Allergen Reactions  . Sulfa Antibiotics Nausea And Vomiting     Vital Signs:   BP (!) 147/81   Pulse (!) 123   Temp 97.6 F (36.4 C) (Oral)   Resp (!) 22   Ht 5' (1.524 m)   Wt 57.4 kg   SpO2 98%   BMI 24.71 kg/m       Prehospital Care Cardiac Rhythm: Atrial fibrillation  Temp (24hrs), Avg:98.1 F (36.7 C), Min:97.6 F (36.4 C), Max:99 F (37.2 C)   Intake/Output:Last shift:      No intake/output data recorded.Last 3 shifts: 09/22 1901 - 09/24 0700 In: 4387.2 [I.V.:3267.2] Out: 5638 [Urine:1530]  Intake/Output Summary (Last 24 hours) at 01/29/2018 0926 Last data filed at 01/29/2018 0700 Gross per 24 hour  Intake 2472.54 ml  Output 957 ml  Net 1515.54 ml    Ventilator Settings: FiO2 (%):  [21 %] 21 %'s  Objective:   Physical Exam:  General: comfortable, no acute distress No edema,cyanosis,clubbing or pallor.no jaundice Mouth dry, pupils equal and reactive cvs s1 and s2, tachycardic with PVCs Chest CTA without wheezing or sig crackles abd soft non distended .+ tenderness left flank/renal are.+ BS. No organomegaly Cns awake and alert x 3 and appropiate non focal, moves all extremeties LLE  4x2 inches superficial  ulcer in the mid leg lateral side with exudate  DATA:    Telemetry: [] Sinus [] A-flutter [] Paced   [x] A-fib [] Multiple PVC's   Labs:  CBC w/Diff Recent Labs  Lab 01/27/18 0845 01/27/18 1905 01/28/18 0257 01/29/18 0432  HGB 11.7* 10.2* 9.8* 8.8*  HCT 37.2 32.0* 30.2* 27.1*  WBC 22.3*  --  14.1* 12.0*  PLT 491*  --  327 271     Chemistry Recent Labs  Lab 01/28/18 1627 01/28/18 2050 01/29/18 0432  NA 146* 144 146*  K 3.9 3.9 4.7  CL 114* 116* 117*  CO2 19* 18* 17*  BUN 68* 67* 59*  CREATININE 2.46* 2.42* 2.31*  GLUCOSE 224* 127* 256*    Hepatic Function  Latest Ref Rng & Units 01/29/2018 01/27/2018 05/28/2017  Total Protein 6.5 - 8.1 g/dL 5.9(L) 7.9 8.3(H)  Albumin 3.5 - 5.0 g/dL 2.7(L) 3.8 3.3(L)  AST 15 - 41 U/L 19 21 19   ALT 0 - 44 U/L 14 23 10(L)  Alk Phosphatase 38 - 126 U/L 67 104 81  Total Bilirubin 0.3 - 1.2 mg/dL 0.7 1.0 0.6     Lactic Acid    Component Value Date/Time   LATICACIDVEN 1.9 01/29/2018 0432     Micro  @MICRO @ Recent Results (from the past 720 hour(s))  Blood Culture (routine x 2)     Status: None (Preliminary result)   Collection Time: 01/27/18  8:40 AM  Result Value Ref Range Status   Specimen Description   Final    BLOOD RIGHT ANTECUBITAL Performed at [facility], [address], [place], [place] [address]    Special Requests   Final    BOTTLES DRAWN AEROBIC AND ANAEROBIC Blood Culture adequate volume Performed at [facility], [address] [place], [place] [address]    Culture  Setup Time   Final    Organism ID to follow AEROBIC BOTTLE ONLY [place] CRITICAL RESULT CALLED TO, READ BACK BY AND VERIFIED WITH: MATT [facility] 01/28/18 0618 REC Performed at [facility], [address], [place], [place] [address]    Culture   Final    GRAM POSITIVE COCCI IDENTIFICATION TO FOLLOW Performed at [facility], [place] [address], [place],  [address]    Report Status PENDING  Incomplete  Blood Culture ID Panel (Reflexed)     Status: Abnormal   Collection Time: 01/27/18  8:40 AM  Result Value Ref Range Status   Enterococcus species NOT DETECTED NOT DETECTED Final   Listeria monocytogenes NOT DETECTED NOT DETECTED Final   Staphylococcus species DETECTED (A) NOT DETECTED Final    Comment: Methicillin (oxacillin) resistant coagulase negative staphylococcus. Possible blood culture contaminant (unless isolated from more than one blood culture draw or clinical case suggests pathogenicity). No antibiotic treatment is indicated for blood  culture  contaminants. CRITICAL RESULT CALLED TO, READ BACK BY AND VERIFIED WITH: MATT MCBANE 01/28/18 0618 REC    Staphylococcus aureus NOT DETECTED NOT DETECTED Final   Methicillin resistance DETECTED (A) NOT DETECTED Final    Comment: CRITICAL RESULT CALLED TO, READ BACK BY AND VERIFIED WITH: MATT MCBANE 01/28/18 0618 REC    Streptococcus species NOT DETECTED NOT DETECTED Final   Streptococcus agalactiae NOT DETECTED NOT DETECTED Final   Streptococcus pneumoniae NOT DETECTED NOT DETECTED Final   Streptococcus pyogenes NOT DETECTED NOT DETECTED Final   Acinetobacter baumannii NOT DETECTED NOT DETECTED Final   Enterobacteriaceae species NOT DETECTED NOT DETECTED Final   Enterobacter cloacae complex NOT  DETECTED NOT DETECTED Final   Escherichia coli NOT DETECTED NOT DETECTED Final   Klebsiella oxytoca NOT DETECTED NOT DETECTED Final   Klebsiella pneumoniae NOT DETECTED NOT DETECTED Final   Proteus species NOT DETECTED NOT DETECTED Final   Serratia marcescens NOT DETECTED NOT DETECTED Final   Haemophilus influenzae NOT DETECTED NOT DETECTED Final   Neisseria meningitidis NOT DETECTED NOT DETECTED Final   Pseudomonas aeruginosa NOT DETECTED NOT DETECTED Final   Candida albicans NOT DETECTED NOT DETECTED Final   Candida glabrata NOT DETECTED NOT DETECTED Final   Candida krusei NOT DETECTED NOT DETECTED Final   Candida parapsilosis NOT DETECTED NOT DETECTED Final   Candida tropicalis NOT DETECTED NOT DETECTED Final    Comment: Performed at [facility], [address] [place], [place] [address]  Blood Culture (routine x 2)     Status: None (Preliminary result)   Collection Time: 01/27/18  8:46 AM  Result Value Ref Range Status   Specimen Description BLOOD LEFT ANTECUBITAL  Final   Special Requests   Final    BOTTLES DRAWN AEROBIC AND ANAEROBIC Blood Culture adequate volume   Culture  Setup Time   Final    GRAM POSITIVE COCCI AEROBIC BOTTLE ONLY CRITICAL VALUE NOTED.  VALUE IS  CONSISTENT WITH PREVIOUSLY REPORTED AND CALLED VALUE. Performed at [facility], [address] [place], [place] [address]    Culture [facility] POSITIVE COCCI  Final   Report Status PENDING  Incomplete  Urine Culture     Status: Abnormal (Preliminary result)   Collection Time: 01/27/18  9:45 AM  Result Value Ref Range Status   Specimen Description   Final    URINE, CATHETERIZED Performed at [facility], [address], [place], [place] [address]    Special Requests   Final    NONE Performed at [facility], [address], [place], [place] [address]    Culture (A)  Final    >=100,000 COLONIES/mL GRAM NEGATIVE RODS CULTURE REINCUBATED FOR BETTER GROWTH Performed at [facility], [place] [address], [place], [place] [address]    Report Status PENDING  Incomplete  MRSA PCR Screening     Status: None   Collection Time: 01/27/18 11:25 AM  Result Value Ref Range Status   MRSA by PCR NEGATIVE NEGATIVE Final    Comment:        The GeneXpert MRSA Assay (FDA approved for NASAL specimens only), is one component of a comprehensive MRSA colonization surveillance program. It is not intended to diagnose MRSA infection nor to guide or monitor treatment for MRSA infections. Performed at [facility], [address] [place], [place] [address]   C difficile quick scan w PCR reflex     Status: None   Collection Time: 01/28/18  1:14 PM  Result Value Ref Range Status   C Diff antigen NEGATIVE NEGATIVE Final   C Diff toxin NEGATIVE NEGATIVE Final   C Diff interpretation No C. difficile detected.  Final    Comment: Performed at [facility], [address], [place], [place] [address]  Urine Culture     Status: None (Preliminary result)   Collection Time: 01/28/18  6:31 PM  Result Value Ref Range Status   Specimen Description   Final    URINE, RANDOM Performed at [facility], [address], [place], [place] [address]    Special  Requests   Final    RIGHT RENAL PELVIS Performed at [facility],  [address], [place], [place] [address]    Culture PENDING  Incomplete   Report Status PENDING  Incomplete  Urine Culture     Status: None (Preliminary result)   Collection Time: 01/28/18  6:33 PM  Result Value Ref Range Status   Specimen Description   Final    URINE, RANDOM Performed at [facility], [address], [place], [place] [address]    Special Requests   Final    LEFT KIDNEY Performed at [facility], [place] [address], [place], [place] [address]    Culture PENDING  Incomplete   Report Status PENDING  Incomplete     ABG    Component Value Date/Time   PHART 7.38 01/29/2018 0454   PCO2ART 29 (L) 01/29/2018 0454   PO2ART 90 01/29/2018 0454   HCO3 17.2 (L) 01/29/2018 0454   ACIDBASEDEF 6.9 (H) 01/29/2018 0454   O2SAT 96.8 01/29/2018 0454     Cardiac Enzymes Recent Labs    01/27/18 1004 01/29/18 0432  CKTOTAL 49 175      Coagulation Lab Results  Component Value Date   INR 1.18 01/29/2018     Radiology Results Reviewed by me and compared with previous imaging studies Ct Abdomen Pelvis Wo Contrast  Result Date: 01/28/2018 CLINICAL DATA:  Inpatient. Elevated lipase. Severe nausea and vomiting. Abdominal pain. Prior hysterectomy. EXAM: CT ABDOMEN AND PELVIS WITHOUT CONTRAST TECHNIQUE: Multidetector CT imaging of the abdomen and pelvis was performed following the standard protocol without IV contrast. COMPARISON:  01/27/2018 abdominal radiograph. 09/19/2011 CT abdomen/pelvis. FINDINGS: Lower chest: Right middle lobe 7 mm solid pulmonary nodule (series 3/image 6), increased from 5 mm in 2013 CT. Coronary atherosclerosis. Hepatobiliary: Normal liver size. No liver mass. Cholecystectomy. No biliary ductal dilatation. CBD diameter 5 mm. Pancreas: Stable coarse uncinate process pancreatic calcification, nonspecific, possibly vascular. No pancreatic mass or duct dilation. No significant  peripancreatic fat stranding or fluid. Spleen: Normal size spleen. Bandlike low-attenuation focus in the lower spleen (series 2/image 20), new. Adrenals/Urinary Tract: Normal adrenals. Chronic severe asymmetric atrophy of the left kidney. Moderate bilateral hydroureteronephrosis to the level of the ureterovesical junction bilaterally. Hyperdense 1.1 cm anterior upper right renal cortical lesion, stable in size since 2013 although with new hyperdensity. Exophytic simple 1.1 cm posterior lower left renal cyst. No additional contour deforming renal lesions. No urolithiasis. Bladder is partially collapsed by indwelling Foley catheter with nondependent bladder gas compatible with instrumentation. No definite bladder wall thickening. Stomach/Bowel: Enteric tube terminates in the distal stomach. Collapsed and grossly normal stomach. Normal caliber small bowel with no small bowel wall thickening. Normal appendix. Normal large bowel with no diverticulosis, large bowel wall thickening or pericolonic fat stranding. Vascular/Lymphatic: Atherosclerotic nonaneurysmal abdominal aorta. No pathologically enlarged lymph nodes in the abdomen or pelvis. Reproductive: Status post hysterectomy, with no abnormal findings at the vaginal cuff. No adnexal mass. Other: No pneumoperitoneum, ascites or focal fluid collection. Moderate fat containing infraumbilical ventral abdominal hernia. Musculoskeletal: No aggressive appearing focal osseous lesions. Mild thoracolumbar spondylosis. Scattered subcentimeter sclerotic osseous lesions in sacrum and bilateral pelvic girdle are similar to prior CT, probably benign bone islands. IMPRESSION: 1. Moderate bilateral hydroureteronephrosis to the level of the UVJ bilaterally, despite the presence of an indwelling Foley catheter, which only partially collapses the bladder. No evidence of obstructing urolithiasis or mass on this noncontrast scan. 2. No convincing noncontrast CT findings of acute  pancreatitis. 3. Bandlike low-attenuation focus in the inferior spleen, poorly characterized on this noncontrast scan, new since  2013 CT. Cannot exclude splenic infarct or mass. Suggest attention on follow-up MRI or CT abdomen in 3-6 months. 4. Enteric tube terminates in the distal stomach. No evidence of bowel obstruction or acute bowel inflammation. 5. Right middle lobe 7 mm solid pulmonary nodule, mildly increased in size since 2013 CT. This nodule remains below PET resolution. Suggest follow-up chest CT in 6 months. 6. Moderate fat containing infraumbilical ventral abdominal hernia. 7.  Aortic Atherosclerosis (ICD10-I70.0). Electronically Signed   By: Ilona Sorrel M.D.   On: 01/28/2018 02:54   Dg Abd 1 View  Result Date: 01/27/2018 CLINICAL DATA:  NG tube placement EXAM: ABDOMEN - 1 VIEW COMPARISON:  01/27/2018 FINDINGS: NG tube coils in the stomach with the tip in the fundus. Nonobstructive bowel gas pattern. Prior cholecystectomy. IMPRESSION: NG tube coils in the stomach with the tip in the fundus. Electronically Signed   By: Rolm Baptise M.D.   On: 01/27/2018 19:14   Dg Abd 1 View  Result Date: 01/27/2018 CLINICAL DATA:  NG tube placement EXAM: ABDOMEN - 1 VIEW COMPARISON:  01/27/2018 FINDINGS: NG tube is in the stomach. Nonobstructive bowel gas pattern. Visualized lung bases clear. Prior cholecystectomy. IMPRESSION: NG tube tip in the mid stomach. Electronically Signed   By: Rolm Baptise M.D.   On: 01/27/2018 18:50   US Renal  Result Date: 01/27/2018 CLINICAL DATA:  Acute renal failure EXAM: RENAL / URINARY TRACT ULTRASOUND COMPLETE COMPARISON:  CT abdomen/pelvis dated 09/19/2011 FINDINGS: Right Kidney: Length: 10.5 cm. Echogenic renal parenchyma. 16 x 14 x 10 mm lower pole cyst. No hydronephrosis. Left Kidney: Length: 8.6 cm. Cortical scarring with lobular parenchyma. Echogenic renal parenchyma. Stable hydronephrosis versus extrarenal pelvis. Bladder: Within normal limits. IMPRESSION: Echogenic  renal parenchyma, suggesting medical renal disease. Left renal cortical scarring with stable hydronephrosis versus extrarenal pelvis, chronic. This appearance is unchanged from 2013. 1.6 cm right lower pole renal cyst, simple. Electronically Signed   By: Julian Hy M.D.   On: 01/27/2018 12:41   Dg Chest Port 1 View  Result Date: 01/27/2018 CLINICAL DATA:  Fall this morning. Patient reports being diagnosed with a kidney infection last week. Vomiting. History of diabetes and hypertension. EXAM: PORTABLE CHEST 1 VIEW COMPARISON:  05/17/2006 radiographs.  CT neck 05/28/2017 FINDINGS: 0845 hours. The heart size is at the upper limits of normal for AP technique. There is aortic atherosclerosis. There is a left upper lobe lung nodule measuring approximately 14 mm (not corrected for magnification), unchanged from previous neck CT. The lungs are otherwise clear. There is no pleural effusion or pneumothorax. No acute osseous findings are evident. IMPRESSION: 1. No acute cardiopulmonary process. 2. Left upper lobe pulmonary nodule, which was enlarging on neck CT performed in January. This remains concerning for malignancy, and further evaluation recommended if not performed elsewhere. Electronically Signed   By: Richardean Sale M.D.   On: 01/27/2018 09:17   Dg Abd Portable 2 Views  Result Date: 01/27/2018 CLINICAL DATA:  Fall this morning, patient has been weak for the past couple weeks and states that she was diagnosed with kidney infection a week ago and that pt has been having dark blood like emesis for the past couple days. Possible sepsis. Hx of diabetes, HTN, abdominal hysterectomy. EXAM: PORTABLE ABDOMEN - 2 VIEW COMPARISON:  CT, 09/19/2011 FINDINGS: Normal bowel gas pattern.  No free air. No evidence of renal or ureteral stones. Clips are upper quadrant reflect a prior cholecystectomy. There are scattered vascular calcifications. No acute skeletal abnormality.  Lung bases are clear. IMPRESSION: 1. No  acute findings.  No evidence bowel obstruction or free air. Electronically Signed   By: Lajean Manes M.D.   On: 01/27/2018 09:14   [place] Ekg Site Rite  Result Date: 01/28/2018 If Site Rite image not attached, placement could not be confirmed due to current cardiac rhythm.     ECHO      The patient is: [x]  acutely ill Risk of deterioration: []  moderate   [x]  critically ill  []  high   [x] See my orders for details  My assessment, plan of care, findings, medications, side effects etc were discussed with: [x] nursing [] PT/OT   [x] respiratory therapy [] Dr.  [] family []    Oswaldo Milian, M.D. Pulmonary Critical Care & Sleep Medicine  @[facility] @ 01/29/2018

## 2018-01-29 NOTE — Progress Notes (Signed)
OT Cancellation Note  Patient Details Name: Tiffany LangeBarbara Manning MRN: [id number] DOB: 11/03/1940   Cancelled Treatment:    Reason Eval/Treat Not Completed: Patient at procedure or test/ unavailable. Order received, chart reviewed. Pt receiving doppler to rule out BLE DVT. Will re-attempt OT evaluation at later date/time as pt is available and medically appropriate.   Richrd PrimeJamie Stiller, MPH, MS, OTR/L ascom [phone] 01/29/18, 11:45 AM

## 2018-01-29 NOTE — Progress Notes (Signed)
Annabelle Harmanana, NP gave order to start new order of lantus for tonight.

## 2018-01-29 NOTE — Progress Notes (Signed)
ANTICOAGULATION CONSULT NOTE  Pharmacy Consult for heparin Indication: atrial fibrillation  Allergies  Allergen Reactions  . Sulfa Antibiotics Nausea And Vomiting    Patient Measurements: Height: 5' (152.4 cm) Weight: 126 lb 8.7 oz (57.4 kg) IBW/kg (Calculated) : 45.5 Heparin Dosing Weight: 57 kg  Vital Signs: Temp: 97.6 F (36.4 C) (09/24 0802) Temp Source: Oral (09/24 0802) BP: 117/79 (09/24 1000) Pulse Rate: 139 (09/24 1000)  Labs: Recent Labs    01/27/18 0845 01/27/18 1004  01/27/18 1905  01/28/18 0257  01/28/18 1627 01/28/18 2050 01/29/18 0432  HGB 11.7*  --   --  10.2*  --  9.8*  --   --   --  8.8*  HCT 37.2  --   --  32.0*  --  30.2*  --   --   --  27.1*  PLT 491*  --   --   --   --  327  --   --   --  271  LABPROT 14.9  --   --   --   --   --   --   --   --  14.9  INR 1.18  --   --   --   --   --   --   --   --  1.18  CREATININE 3.40* 3.19*   < > 2.60*   < > 2.62*   < > 2.46* 2.42* 2.31*  CKTOTAL  --  49  --   --   --   --   --   --   --  175   < > = values in this interval not displayed.    Estimated Creatinine Clearance: 15.9 mL/min (A) (by C-G formula based on SCr of 2.31 mg/dL (H)).   Medical History: Past Medical History:  Diagnosis Date  . Diabetes mellitus without complication (HCC)   . Hyperlipidemia   . Hypertension     Assessment: 77 year old female presented with severe sepsis. Patient with atrial fibrillation with RVR, unknown date of onset. Pharmacy consulted to dose heparin. Last dose of subQ heparin 9/24 at 0543.  Goal of Therapy:  Heparin level 0.3-0.7 units/ml Monitor platelets by anticoagulation protocol: Yes   Plan:  Will give heparin 1500 unit bolus x 1 Start heparin drip at 800 units/hr Check HL at 1830 CBC ordered with morning labs  Pricilla RiffleAbby K Blondine Hottel, PharmD Pharmacy Resident  01/29/2018 1:24 PM

## 2018-01-29 NOTE — Progress Notes (Signed)
Pharmacy Antibiotic Note  Tiffany LangeBarbara Manning is a 77 y.o. female admitted on 01/27/2018 with severe sepsis. Suspect sepsis due to UTI. Patient reports non-healing wounds to lower extremity. Pharmacy has been consulted for vancomycin and cefepime dosing. ID has also been consulted.  Plan: Continue cefepime 1 g IV q24h Continue vancomycin 750 mg IV q48h. Goal trough 15-20 mcg/mL. Trough ordered 9/25 at 1700.  Height: 5' (152.4 cm) Weight: 126 lb 8.7 oz (57.4 kg) IBW/kg (Calculated) : 45.5  Temp (24hrs), Avg:98.1 F (36.7 C), Min:97.6 F (36.4 C), Max:99 F (37.2 C)  Recent Labs  Lab 01/27/18 0845  01/27/18 1150  01/27/18 1655 01/27/18 1905 01/27/18 2249 01/28/18 0257 01/28/18 0839 01/28/18 1313 01/28/18 1627 01/28/18 2050 01/29/18 0432  WBC 22.3*  --   --   --   --   --   --  14.1*  --   --   --   --  12.0*  CREATININE 3.40*   < > 3.01*   < >  --  2.60* 2.46* 2.62* 2.44* 2.62* 2.46* 2.42* 2.31*  LATICACIDVEN 2.9*  --  1.9  --  1.9 2.0* 2.4*  --   --   --   --   --  1.9   < > = values in this interval not displayed.    Estimated Creatinine Clearance: 15.9 mL/min (A) (by C-G formula based on SCr of 2.31 mg/dL (H)).    Allergies  Allergen Reactions  . Sulfa Antibiotics Nausea And Vomiting    Antimicrobials this admission: Vancomycin 9/22 >> Cefepime 9/22 >>  Dose adjustments this admission: N/A  Microbiology results: 9/22 BCx: 2/4 (aerobic bottles) MRSS 9/24 BCx: pending 9/22 UCx: > 100,000 E. coli  9/22 MRSA PCR: negative 9/23 C. diff PCR: negative  Thank you for allowing pharmacy to be a part of this patient's care.  Pricilla RiffleAbby K Ellington, PharmD Pharmacy Resident  01/29/2018 3:09 PM

## 2018-01-29 NOTE — Anesthesia Postprocedure Evaluation (Signed)
Anesthesia Post Note  Patient: Charlann LangeBarbara Rahimi  Procedure(s) Performed: CYSTOSCOPY WITH STENT PLACEMENT (Bilateral Ureter)  Patient location during evaluation: ICU Anesthesia Type: General Level of consciousness: awake and alert Pain management: pain level controlled Vital Signs Assessment: post-procedure vital signs reviewed and stable Respiratory status: spontaneous breathing Cardiovascular status: stable Anesthetic complications: no     Last Vitals:  Vitals:   01/29/18 0600 01/29/18 0700  BP: (!) 112/94 (!) 147/81  Pulse: (!) 117 (!) 123  Resp: (!) 23 (!) 22  Temp:    SpO2: 97% 98%    Last Pain:  Vitals:   01/29/18 0337  TempSrc: Axillary  PainSc:                  Zachary GeorgeWeatherly,  Yeilin Zweber F

## 2018-01-30 DIAGNOSIS — R0602 Shortness of breath: Secondary | ICD-10-CM

## 2018-01-30 DIAGNOSIS — N170 Acute kidney failure with tubular necrosis: Secondary | ICD-10-CM

## 2018-01-30 DIAGNOSIS — E1110 Type 2 diabetes mellitus with ketoacidosis without coma: Secondary | ICD-10-CM

## 2018-01-30 LAB — CULTURE, BLOOD (ROUTINE X 2)
SPECIAL REQUESTS: ADEQUATE
Special Requests: ADEQUATE

## 2018-01-30 LAB — URINE CULTURE: Culture: NO GROWTH

## 2018-01-30 LAB — BASIC METABOLIC PANEL
Anion gap: 7 (ref 5–15)
BUN: 48 mg/dL — ABNORMAL HIGH (ref 8–23)
CO2: 20 mmol/L — ABNORMAL LOW (ref 22–32)
Calcium: 7.8 mg/dL — ABNORMAL LOW (ref 8.9–10.3)
Chloride: 120 mmol/L — ABNORMAL HIGH (ref 98–111)
Creatinine, Ser: 1.96 mg/dL — ABNORMAL HIGH (ref 0.44–1.00)
GFR calc Af Amer: 27 mL/min — ABNORMAL LOW (ref >60)
GFR calc non Af Amer: 23 mL/min — ABNORMAL LOW (ref >60)
Glucose, Bld: 135 mg/dL — ABNORMAL HIGH (ref 70–99)
Potassium: 3.9 mmol/L (ref 3.5–5.1)
Sodium: 147 mmol/L — ABNORMAL HIGH (ref 135–145)

## 2018-01-30 LAB — CBC
HCT: 25.4 % — ABNORMAL LOW (ref 35.0–47.0)
Hemoglobin: 8.2 g/dL — ABNORMAL LOW (ref 12.0–16.0)
MCH: 30.3 pg (ref 26.0–34.0)
MCHC: 32.4 g/dL (ref 32.0–36.0)
MCV: 93.7 fL (ref 80.0–100.0)
Platelets: 215 10*3/uL (ref 150–440)
RBC: 2.71 MIL/uL — ABNORMAL LOW (ref 3.80–5.20)
RDW: 15.9 % — ABNORMAL HIGH (ref 11.5–14.5)
WBC: 11.8 10*3/uL — ABNORMAL HIGH (ref 3.6–11.0)

## 2018-01-30 LAB — LIPID PANEL
Cholesterol: 68 mg/dL (ref 0–200)
HDL: 29 mg/dL — AB (ref >40)
LDL Cholesterol: 14 mg/dL (ref 0–99)
Total CHOL/HDL Ratio: 2.3 RATIO
Triglycerides: 125 mg/dL (ref <150)
VLDL: 25 mg/dL (ref 0–40)

## 2018-01-30 LAB — PHOSPHORUS: Phosphorus: 2.4 mg/dL — ABNORMAL LOW (ref 2.5–4.6)

## 2018-01-30 LAB — VANCOMYCIN, TROUGH: Vancomycin Tr: 7 ug/mL — ABNORMAL LOW (ref 15–20)

## 2018-01-30 LAB — GLUCOSE, CAPILLARY
GLUCOSE-CAPILLARY: 106 mg/dL — AB (ref 70–99)
GLUCOSE-CAPILLARY: 131 mg/dL — AB (ref 70–99)
Glucose-Capillary: 118 mg/dL — ABNORMAL HIGH (ref 70–99)
Glucose-Capillary: 144 mg/dL — ABNORMAL HIGH (ref 70–99)
Glucose-Capillary: 134 mg/dL — ABNORMAL HIGH (ref 70–99)
Glucose-Capillary: 118 mg/dL — ABNORMAL HIGH (ref 70–99)

## 2018-01-30 LAB — HEMOGLOBIN A1C
HEMOGLOBIN A1C: 7.8 % — AB (ref 4.8–5.6)
MEAN PLASMA GLUCOSE: 177.16 mg/dL

## 2018-01-30 LAB — HEPARIN LEVEL (UNFRACTIONATED): HEPARIN UNFRACTIONATED: 0.43 [IU]/mL (ref 0.30–0.70)

## 2018-01-30 LAB — MAGNESIUM: Magnesium: 2 mg/dL (ref 1.7–2.4)

## 2018-01-30 MED ORDER — METOPROLOL TARTRATE 50 MG PO TABS
50.0000 mg | ORAL_TABLET | Freq: Two times a day (BID) | ORAL | Status: DC
  Administered 2018-01-30 – 2018-01-31 (×2): 50 mg via ORAL
  Filled 2018-01-30 (×2): qty 1

## 2018-01-30 MED ORDER — SODIUM CHLORIDE 0.9 % IV SOLN
1.0000 g | INTRAVENOUS | Status: DC
  Administered 2018-01-31 – 2018-02-01 (×2): 1 g via INTRAVENOUS
  Filled 2018-01-30: qty 1
  Filled 2018-01-30 (×2): qty 10

## 2018-01-30 MED ORDER — METOPROLOL TARTRATE 25 MG PO TABS
12.5000 mg | ORAL_TABLET | Freq: Four times a day (QID) | ORAL | Status: DC
  Administered 2018-01-30 (×2): 12.5 mg via ORAL
  Filled 2018-01-30 (×2): qty 1

## 2018-01-30 NOTE — Progress Notes (Signed)
Pharmacy Antibiotic Note  Tiffany Manning is a 77 y.o. female admitted on 01/27/2018 with severe sepsis. Suspect sepsis due to UTI. Patient reports non-healing wounds to lower extremity. Pharmacy has been consulted for vancomycin and cefepime dosing. ID has also been consulted.  Plan: Will transition cefepime to ceftriaxone 1 g IV q24h with first dose 9/26 @ 1000  Continue vancomycin 750 mg IV q48h. Goal trough 15-20 mcg/mL. Trough ordered 9/25 at 1700.  Height: 5' (152.4 cm) Weight: 126 lb 8.7 oz (57.4 kg) IBW/kg (Calculated) : 45.5  Temp (24hrs), Avg:98.2 F (36.8 C), Min:98.2 F (36.8 C), Max:98.2 F (36.8 C)  Recent Labs  Lab 01/27/18 0845  01/27/18 1150  01/27/18 1655 01/27/18 1905 01/27/18 2249 01/28/18 0257  01/28/18 1313 01/28/18 1627 01/28/18 2050 01/29/18 0432 01/30/18 0218  WBC 22.3*  --   --   --   --   --   --  14.1*  --   --   --   --  12.0* 11.8*  CREATININE 3.40*   < > 3.01*   < >  --  2.60* 2.46* 2.62*   < > 2.62* 2.46* 2.42* 2.31* 1.96*  LATICACIDVEN 2.9*  --  1.9  --  1.9 2.0* 2.4*  --   --   --   --   --  1.9  --    < > = values in this interval not displayed.    Estimated Creatinine Clearance: 18.8 mL/min (A) (by C-G formula based on SCr of 1.96 mg/dL (H)).    Allergies  Allergen Reactions  . Sulfa Antibiotics Nausea And Vomiting    Antimicrobials this admission: Vancomycin 9/22 >> Cefepime 9/22 >> 9/25 Ceftriaxone 9/26 >>   Dose adjustments this admission: N/A  Microbiology results: 9/22 BCx: 2/4 (aerobic bottles) Staphylococcus hominis 9/24 BCx: NG 24 hours 9/22 UCx: > 100,000 E. coli  9/22 MRSA PCR: negative 9/23 C. diff PCR: negative  Thank you for allowing pharmacy to be a part of this patient's care.  Pricilla Riffle, PharmD Pharmacy Resident  01/30/2018 1:27 PM

## 2018-01-30 NOTE — Progress Notes (Signed)
[facility] - [place] at [facility]   PATIENT NAME: Tiffany Manning    MR#:  [id number]  DATE OF BIRTH:  27-Oct-1940  SUBJECTIVE:   Feels much better today. Tolerating some liquids.  Renal function improving.  Husband is at bedside.  Still remains in A. fib.  REVIEW OF SYSTEMS:    Review of Systems  Unable to perform ROS: Mental acuity    Nutrition: NPO Tolerating Diet: No Tolerating PT: Await Eval.   DRUG ALLERGIES:   Allergies  Allergen Reactions  . Sulfa Antibiotics Nausea And Vomiting    VITALS:  Blood pressure 129/74, pulse (!) 104, temperature 98.2 F (36.8 C), temperature source Oral, resp. rate 19, height 5' (1.524 m), weight 57.4 kg, SpO2 99 %.  PHYSICAL EXAMINATION:   Physical Exam  GENERAL:  77 y.o.-year-old patient lying in bed moaning at times.    EYES: Pupils equal, round, reactive to light and accommodation. No scleral icterus. Extraocular muscles intact.  HEENT: Head atraumatic, normocephalic. Oropharynx and nasopharynx clear. Moist Oral Mucosa.   NECK:  Supple, no jugular venous distention. No thyroid enlargement, no tenderness.  LUNGS: Normal breath sounds bilaterally, no wheezing, rales, rhonchi. No use of accessory muscles of respiration.  CARDIOVASCULAR: S1, S2 Irregular. No murmurs, rubs, or gallops.  ABDOMEN: Soft, nontender, nondistended. Bowel sounds present. No organomegaly or mass.  EXTREMITIES: No cyanosis, clubbing or edema b/l.    NEUROLOGIC: Cranial nerves II through XII are intact. No focal Motor or sensory deficits b/l. Globally weak.   PSYCHIATRIC: The patient is alert and oriented x 3.  SKIN: No obvious rash, lesion, or ulcer.   Foley in place with yellow urine draining.    LABORATORY PANEL:   CBC Recent Labs  Lab 01/30/18 0218  WBC 11.8*  HGB 8.2*  HCT 25.4*  PLT 215   ------------------------------------------------------------------------------------------------------------------  Chemistries  Recent  Labs  Lab 01/29/18 0432 01/30/18 0218  NA 146* 147*  K 4.7 3.9  CL 117* 120*  CO2 17* 20*  GLUCOSE 256* 135*  BUN 59* 48*  CREATININE 2.31* 1.96*  CALCIUM 7.6* 7.8*  MG 1.6* 2.0  AST 19  --   ALT 14  --   ALKPHOS 67  --   BILITOT 0.7  --    ------------------------------------------------------------------------------------------------------------------  Cardiac Enzymes No results for input(s): TROPONINI in the last 168 hours. ------------------------------------------------------------------------------------------------------------------  RADIOLOGY:  Nm Pulmonary Perf And Vent  Result Date: 01/29/2018 CLINICAL DATA:  Suspected pulmonary embolus. Intermediate probability. Negative D-dimer. EXAM: NUCLEAR MEDICINE VENTILATION - PERFUSION LUNG SCAN TECHNIQUE: Ventilation images were obtained in multiple projections using inhaled aerosol Tc-24m DTPA. Perfusion images were obtained in multiple projections after intravenous injection of Tc-54m-MAA. RADIOPHARMACEUTICALS:  34.5 mCi of Tc-32m DTPA aerosol inhalation and 4.3 mCi Tc41m-MAA IV COMPARISON:  Chest x-ray FINDINGS: Patchy ventilation perfusion matched defects with ventilation defects most more prominent than perfusion defects. This is a low probability for pulmonary embolus. IMPRESSION: Low probability scan for pulmonary embolus. Electronically Signed   By: Maisie Fus  Register   On: 01/29/2018 13:26   US Venous Img Lower Bilateral  Result Date: 01/29/2018 CLINICAL DATA:  Bilateral lower extremity pain and edema for the past week. Evaluate for DVT. EXAM: BILATERAL LOWER EXTREMITY VENOUS DOPPLER ULTRASOUND TECHNIQUE: Gray-scale sonography with graded compression, as well as color Doppler and duplex ultrasound were performed to evaluate the lower extremity deep venous systems from the level of the common femoral vein and including the common femoral, femoral, profunda femoral, popliteal and calf veins  including the posterior tibial,  peroneal and gastrocnemius veins when visible. The superficial great saphenous vein was also interrogated. Spectral Doppler was utilized to evaluate flow at rest and with distal augmentation maneuvers in the common femoral, femoral and popliteal veins. COMPARISON:  None. FINDINGS: RIGHT LOWER EXTREMITY Common Femoral Vein: No evidence of thrombus. Normal compressibility, respiratory phasicity and response to augmentation. Saphenofemoral Junction: No evidence of thrombus. Normal compressibility and flow on color Doppler imaging. Profunda Femoral Vein: No evidence of thrombus. Normal compressibility and flow on color Doppler imaging. Femoral Vein: No evidence of thrombus. Normal compressibility, respiratory phasicity and response to augmentation. Popliteal Vein: No evidence of thrombus. Normal compressibility, respiratory phasicity and response to augmentation. Calf Veins: No evidence of thrombus. Normal compressibility and flow on color Doppler imaging. Superficial Great Saphenous Vein: No evidence of thrombus. Normal compressibility. Venous Reflux:  None. Other Findings:  None. LEFT LOWER EXTREMITY Common Femoral Vein: No evidence of thrombus. Normal compressibility, respiratory phasicity and response to augmentation. Saphenofemoral Junction: No evidence of thrombus. Normal compressibility and flow on color Doppler imaging. Profunda Femoral Vein: No evidence of thrombus. Normal compressibility and flow on color Doppler imaging. Femoral Vein: No evidence of thrombus. Normal compressibility, respiratory phasicity and response to augmentation. Popliteal Vein: No evidence of thrombus. Normal compressibility, respiratory phasicity and response to augmentation. Calf Veins: No evidence of thrombus. Normal compressibility and flow on color Doppler imaging. Superficial Great Saphenous Vein: No evidence of thrombus. Normal compressibility. Venous Reflux:  None. Other Findings:  None. IMPRESSION: No evidence DVT within either  lower extremity. Electronically Signed   By: Simonne Come M.D.   On: 01/29/2018 10:46   Dg Chest Port 1 View  Result Date: 01/29/2018 CLINICAL DATA:  Shortness of breath. EXAM: PORTABLE CHEST 1 VIEW COMPARISON:  01/27/2018. FINDINGS: NG tube noted coiled stomach. Cardiomegaly scratched it stable cardiomegaly. No pulmonary venous congestion. Persistent worrisome pulmonary nodule left upper lobe. Again malignancy could present this fashion. Low lung volumes. Small left pleural effusion. No pneumothorax. IMPRESSION: 1.  NG tube noted coiled stomach. 2. Persistent left upper lobe nodule, again malignancy could present this fashion. Further evaluation suggested. 3.  Low lung volumes.  Small left pleural effusion. Electronically Signed   By: Maisie Fus  Register   On: 01/29/2018 10:00     ASSESSMENT AND PLAN:   77 year old female with past medical history of diabetes, hypertension, hyperlipidemia, previous history of a neck abscess who presents to the hospital due to weakness, lethargy and malaise and noted to have severe sepsis secondary to UTI and also noted to be in acute diabetic ketoacidosis.  1.  Acute diabetic ketoacidosis- secondary to underlying sepsis.  - much improved and AG closed. Off insulin gtt and electrolytes stable.  2.  Sepsis-patient has severe sepsis secondary to UTI. -Urine cultures are growing 100,000 colonies of E. coli.  Pansensitive and antibiotics changed to IV ceftriaxone.  Blood cultures + for Staph Hominis and repeat cultures done on 01/29/18 are pending..   -Continue empiric vancomycin, Ceftriaxone.  - appreciate ID input.   3.  Urinary tract infection-source of patient's sepsis. Urine culture + E. Coli and it's pansensitive.   -Patient had a renal ultrasound done which showed some scarring and a CT scan of the abdomen pelvis suggestive of bilateral hydronephrosis.  -Seen by urology in the setting of severe sepsis and possible pyenephrosis of the left kidney patient is  status post bilateral ureteral stent placement now.  Patient's urine is clearing up.  4.  Acute kidney  injury secondary to severe sepsis and underlying UTI combined with concomitant use of diuretics. - Seen by nephrology, like in the setting of multifactorial issues.  Patient is not oliguric.  No acute indication for dialysis as per nephrology.  Continue treatment of underlying sepsis and UTI as mentioned above. - Cr. Trending down.  Renal dose meds and avoid nephrotoxins.  5.  Atrial fibrillation with RVR- continue oral metoprolol, continue heparin drip. - likely in the setting of sepsis.  Echo showing normal EF. ?? Consider Cardiology consult.   6.  Leukocytosis- secondary to sepsis/UTI. - improving w/ abx and treatment of underlying sepsis.    7.  Essential hypertension- cont. To hold anti-HTN for now given relative hypotension.    8. Anemia - likely anemia of chronic disease and will cont. To follow hg.  - no need for transfusion.   All the records are reviewed and case discussed with Care Management/Social Worker. Management plans discussed with the patient, family and they are in agreement.  CODE STATUS: Full code  DVT Prophylaxis: Hep SQ  TOTAL TIME TAKING CARE OF THIS PATIENT: 30 minutes.   POSSIBLE D/C IN 2-3 DAYS, DEPENDING ON CLINICAL CONDITION.   Houston Siren M.D on 01/30/2018 at 1:17 PM  Between 7am to 6pm - Pager - [phone]  After 6pm go to [email or web address] - Social research officer, government  [facility] [place] [facility]  Office  [phone]  CC: Primary care physician; Jaclyn Shaggy, MD

## 2018-01-30 NOTE — Progress Notes (Signed)
Called pt's husband to update him about the pt being moved to room 260, no answer and voicemail set up.

## 2018-01-30 NOTE — Progress Notes (Signed)
ANTICOAGULATION CONSULT NOTE  Pharmacy Consult for heparin Indication: atrial fibrillation  Allergies  Allergen Reactions  . Sulfa Antibiotics Nausea And Vomiting    Patient Measurements: Height: 5' (152.4 cm) Weight: 126 lb 8.7 oz (57.4 kg) IBW/kg (Calculated) : 45.5 Heparin Dosing Weight: 57 kg  Vital Signs: Temp: 98.2 F (36.8 C) (09/24 2013) Temp Source: Oral (09/24 2013) BP: 135/89 (09/25 0037) Pulse Rate: 111 (09/25 0200)  Labs: Recent Labs    01/27/18 0845 01/27/18 1004  01/28/18 0257  01/28/18 1627 01/28/18 2050 01/29/18 0432 01/29/18 1845 01/30/18 0218  HGB 11.7*  --    < > 9.8*  --   --   --  8.8*  --  8.2*  HCT 37.2  --    < > 30.2*  --   --   --  27.1*  --  25.4*  PLT 491*  --   --  327  --   --   --  271  --  215  LABPROT 14.9  --   --   --   --   --   --  14.9  --   --   INR 1.18  --   --   --   --   --   --  1.18  --   --   HEPARINUNFRC  --   --   --   --   --   --   --   --  0.65 0.43  CREATININE 3.40* 3.19*   < > 2.62*   < > 2.46* 2.42* 2.31*  --   --   CKTOTAL  --  49  --   --   --   --   --  175  --   --    < > = values in this interval not displayed.    Estimated Creatinine Clearance: 15.9 mL/min (A) (by C-G formula based on SCr of 2.31 mg/dL (H)).   Medical History: Past Medical History:  Diagnosis Date  . Diabetes mellitus without complication (HCC)   . Hyperlipidemia   . Hypertension     Assessment: 77 year old female presented with severe sepsis. Patient with atrial fibrillation with RVR, unknown date of onset. Pharmacy consulted to dose heparin. Last dose of subQ heparin 9/24 at 0543.  Goal of Therapy:  Heparin level 0.3-0.7 units/ml Monitor platelets by anticoagulation protocol: Yes   Plan:  9/25 @ 0200 HL 0.43 therapeutic. Will continue rate at 800 units/hr and will recheck next level w/ am labs. CBC steadily trending down, trops also trending down. Will continue to monitor.  Thomasene Rippleavid  Moss Berry, PharmD, BCPS Clinical  Pharmacist 01/30/2018 3:35 AM

## 2018-01-30 NOTE — Progress Notes (Signed)
Report given to [employer]. Pt transferred with no difficulties.

## 2018-01-30 NOTE — Progress Notes (Addendum)
Inpatient Diabetes Program Recommendations  AACE/ADA: New Consensus Statement on Inpatient Glycemic Control (2019)  Target Ranges:  Prepandial:   less than 140 mg/dL      Peak postprandial:   less than 180 mg/dL (1-2 hours)      Critically ill patients:  140 - 180 mg/dL   Results for Tiffany Manning, Tiffany Manning (MRN [id number]) as of 01/30/2018 09:01  Ref. Range 01/29/2018 07:29 01/29/2018 13:35 01/29/2018 15:43 01/29/2018 19:36 01/29/2018 22:04 01/30/2018 00:40 01/30/2018 04:45 01/30/2018 07:22  Glucose-Capillary Latest Ref Range: 70 - 99 mg/dL 629 (H) 528 (H) 413 (H) 143 (H) 115 (H) 131 (H) 106 (H) 118 (H)   Review of Glycemic Control  Diabetes history: DM2 Outpatient Diabetes medications: Metaglip 5-500 mg once a day every other day Current orders for Inpatient glycemic control: Lantus 14 units QHS, Novolog 0-9 units Q4H  Inpatient Diabetes Program Recommendations:  Insulin - Basal: Lantus was increased to 14 units QHS on 01/29/18 and glucose has improved. Oral Agents: May want to consider discontinuing Lantus and ordering Glipizide 2.5 mg QPM to determine impact on glucose. Would not start until this evening since patient received Lantus 14 units last night at bedtime. HgbA1C: Last A1C 7% on 05/30/17 indicating an average glucose of 154 mg/dl.  Please add on A1C to blood in lab.  Addendum 01/30/18@11 :29-Spoke with patient and her husband regarding DM control. Patient reports that she is followed by PCP for DM control. Patient reports that she was taking Metaglip 5-500 mg once every other day. Noted Metaglip 5-500 BID on home medication list and asked patient about it and she reports that her glucose has been running lower and her PCP had adjusted the Metaglip to one tablet every other day. Patient reports that her glucose is usually 100-178 mg/dl when she checks at home. Patient notes that due to having N/V at home she had went a few days without taking the Metaglip prior to coming to the hospital. Explained that  due to kidney function, it would not be recommended for her to be restarted on Metformin (which is one of the medications in the Metaglip). Explained that MD may want to consider just prescribing Glipizide daily. Patient and her husband verbalized understanding of information discussed and they report they have no questions at this time related to DM.  Thanks, Orlando Penner, RN, MSN, CDE Diabetes Coordinator Inpatient Diabetes Program [phone] (Team Pager from 8am to 5pm)

## 2018-01-30 NOTE — Progress Notes (Signed)
Pharmacy Antibiotic Note  Tiffany Manning is a 77 y.o. female admitted on 01/27/2018 with severe sepsis. Suspect sepsis due to UTI. Patient reports non-healing wounds to lower extremity. Pharmacy has been consulted for vancomycin and cefepime dosing. ID has also been consulted.  Plan: Continue  ceftriaxone 1 g IV q24h   Continue vancomycin 750 mg IV q48h. Patient never received dose on 9/23.  Goal trough 15-20 mcg/mL. Will order vanc level 9/27 at 1730.  Height: 5' (152.4 cm) Weight: 126 lb 8.7 oz (57.4 kg) IBW/kg (Calculated) : 45.5  Temp (24hrs), Avg:98.3 F (36.8 C), Min:97.6 F (36.4 C), Max:98.7 F (37.1 C)  Recent Labs  Lab 01/27/18 0845  01/27/18 1150  01/27/18 1655 01/27/18 1905 01/27/18 2249 01/28/18 0257  01/28/18 1313 01/28/18 1627 01/28/18 2050 01/29/18 0432 01/30/18 0218 01/30/18 1712  WBC 22.3*  --   --   --   --   --   --  14.1*  --   --   --   --  12.0* 11.8*  --   CREATININE 3.40*   < > 3.01*   < >  --  2.60* 2.46* 2.62*   < > 2.62* 2.46* 2.42* 2.31* 1.96*  --   LATICACIDVEN 2.9*  --  1.9  --  1.9 2.0* 2.4*  --   --   --   --   --  1.9  --   --   VANCOTROUGH  --   --   --   --   --   --   --   --   --   --   --   --   --   --  7*   < > = values in this interval not displayed.    Estimated Creatinine Clearance: 18.8 mL/min (A) (by C-G formula based on SCr of 1.96 mg/dL (H)).    Allergies  Allergen Reactions  . Sulfa Antibiotics Nausea And Vomiting    Antimicrobials this admission: Vancomycin 9/22 >> Cefepime 9/22 >> 9/25 Ceftriaxone 9/26 >>   Dose adjustments this admission: N/A  Microbiology results: 9/22 BCx: 2/4 (aerobic bottles) Staphylococcus hominis 9/24 BCx: NG 24 hours 9/22 UCx: > 100,000 E. coli  9/22 MRSA PCR: negative 9/23 C. diff PCR: negative  Thank you for allowing pharmacy to be a part of this patient's care.  Gardner Candle, PharmD, BCPS Clinical Pharmacist 01/30/2018 6:04 PM

## 2018-01-30 NOTE — Progress Notes (Signed)
OT Cancellation Note  Patient Details Name: Charlann LangeBarbara Mcluckie MRN: [id number] DOB: 01/18/1941   Cancelled Treatment:    Reason Eval/Treat Not Completed: Medical issues which prohibited therapy. Per cardiologist resting HR to remain less than 110. Per PT, pt asleep, lethargic upon very recent attempt. Resting HR ranged between 107-109. Will attempt OT evaluation at later date/time when pt is awake and medically appropriate.  Richrd PrimeJamie Stiller, MPH, MS, OTR/L ascom [phone] 01/30/18, 2:16 PM

## 2018-01-30 NOTE — Progress Notes (Signed)
Progress Note  Patient Name: Tiffany Manning Date of Encounter: 01/30/2018  Primary Cardiologist: [facility]- [facility]  Subjective   Patient reports she feels only slightly improved from yesterday. No active CP, SOB, feeling of heart racing / tachycardia, or near-syncope sx. She continues to feel nauseas and reports she is able to keep ice chips down "unless [she] has too many ice chips."   We reviewed the results of yesterday's echo and the plan to transition to PO medication once tolerated and both patient and husband expressed their understanding. They are both happy with the care received at [facility].  HR improved from yesterday 120s  100-110s.  Inpatient Medications    Scheduled Meds: . chlorhexidine  15 mL Mouth Rinse BID  . insulin aspart  0-9 Units Subcutaneous Q4H  . insulin glargine  14 Units Subcutaneous QHS  . mouth rinse  15 mL Mouth Rinse q12n4p  . metoprolol tartrate  2.5 mg Intravenous Q6H  . phosphorus  500 mg Oral BID   Continuous Infusions: . sodium chloride 75 mL/hr at 01/30/18 0025  . ceFEPime (MAXIPIME) IV    . famotidine (PEPCID) IV 20 mg (01/29/18 2154)  . heparin 800 Units/hr (01/29/18 1221)  . vancomycin     PRN Meds: acetaminophen **OR** acetaminophen, ondansetron **OR** ondansetron (ZOFRAN) IV, promethazine   Vital Signs    Vitals:   01/30/18 0100 01/30/18 0200 01/30/18 0300 01/30/18 0449  BP:    129/74  Pulse: (!) 105 (!) 111 (!) 110 (!) 104  Resp: (!) 21 19 17 19   Temp:      TempSrc:      SpO2: 100% 100% 98% 99%  Weight:      Height:        Intake/Output Summary (Last 24 hours) at 01/30/2018 0747 Last data filed at 01/30/2018 0449 Gross per 24 hour  Intake 1170 ml  Output 1150 ml  Net 20 ml   Filed Weights   01/27/18 0840 01/27/18 1122  Weight: 54.4 kg 57.4 kg    Telemetry    IRIR with HR in 100-110s - Personally Reviewed   Physical Exam   GEN: No acute distress. Frail elderly woman watching television with husband. Neck: CVP  elevated to level of jaw Cardiac: RRR, no murmurs, rubs, or gallops.  Respiratory: Reduced breath sounds at b/l lung bases. Wheezing improved from yesterday GI: Soft, nontender, non-distended  MS: No edema; No deformity. Left leg remains bandaged d/t chronic wound. Per patient's husband, plan to follow-up at wound center Neuro:  Nonfocal  Psych: Normal affect   Labs    Chemistry Recent Labs  Lab 01/27/18 0845  01/28/18 2050 01/29/18 0432 01/30/18 0218  NA 139   < > 144 146* 147*  K 3.7   < > 3.9 4.7 3.9  CL 106   < > 116* 117* 120*  CO2 8*   < > 18* 17* 20*  GLUCOSE 328*   < > 127* 256* 135*  BUN 96*   < > 67* 59* 48*  CREATININE 3.40*   < > 2.42* 2.31* 1.96*  CALCIUM 8.9   < > 7.4* 7.6* 7.8*  PROT 7.9  --   --  5.9*  --   ALBUMIN 3.8  --   --  2.7*  --   AST 21  --   --  19  --   ALT 23  --   --  14  --   ALKPHOS 104  --   --  67  --  BILITOT 1.0  --   --  0.7  --   GFRNONAA 12*   < > 18* 19* 23*  GFRAA 14*   < > 21* 22* 27*  ANIONGAP 25*   < > 10 12 7    < > = values in this interval not displayed.     Hematology Recent Labs  Lab 01/28/18 0257 01/29/18 0432 01/30/18 0218  WBC 14.1* 12.0* 11.8*  RBC 3.26* 2.90* 2.71*  HGB 9.8* 8.8* 8.2*  HCT 30.2* 27.1* 25.4*  MCV 92.5 93.3 93.7  MCH 30.1 30.4 30.3  MCHC 32.6 32.5 32.4  RDW 14.8* 15.1* 15.9*  PLT 327 271 215    Cardiac EnzymesNo results for input(s): TROPONINI in the last 168 hours. No results for input(s): TROPIPOC in the last 168 hours.   BNPNo results for input(s): BNP, PROBNP in the last 168 hours.   DDimer No results for input(s): DDIMER in the last 168 hours.   Radiology    Nm Pulmonary Perf And Vent  Result Date: 01/29/2018 CLINICAL DATA:  Suspected pulmonary embolus. Intermediate probability. Negative D-dimer. EXAM: NUCLEAR MEDICINE VENTILATION - PERFUSION LUNG SCAN TECHNIQUE: Ventilation images were obtained in multiple projections using inhaled aerosol Tc-76m DTPA. Perfusion images were  obtained in multiple projections after intravenous injection of Tc-15m-MAA. RADIOPHARMACEUTICALS:  34.5 mCi of Tc-78m DTPA aerosol inhalation and 4.3 mCi Tc60m-MAA IV COMPARISON:  Chest x-ray FINDINGS: Patchy ventilation perfusion matched defects with ventilation defects most more prominent than perfusion defects. This is a low probability for pulmonary embolus. IMPRESSION: Low probability scan for pulmonary embolus. Electronically Signed   By: Maisie Fus  Register   On: 01/29/2018 13:26   US Venous Img Lower Bilateral  Result Date: 01/29/2018 CLINICAL DATA:  Bilateral lower extremity pain and edema for the past week. Evaluate for DVT. EXAM: BILATERAL LOWER EXTREMITY VENOUS DOPPLER ULTRASOUND TECHNIQUE: Gray-scale sonography with graded compression, as well as color Doppler and duplex ultrasound were performed to evaluate the lower extremity deep venous systems from the level of the common femoral vein and including the common femoral, femoral, profunda femoral, popliteal and calf veins including the posterior tibial, peroneal and gastrocnemius veins when visible. The superficial great saphenous vein was also interrogated. Spectral Doppler was utilized to evaluate flow at rest and with distal augmentation maneuvers in the common femoral, femoral and popliteal veins. COMPARISON:  None. FINDINGS: RIGHT LOWER EXTREMITY Common Femoral Vein: No evidence of thrombus. Normal compressibility, respiratory phasicity and response to augmentation. Saphenofemoral Junction: No evidence of thrombus. Normal compressibility and flow on color Doppler imaging. Profunda Femoral Vein: No evidence of thrombus. Normal compressibility and flow on color Doppler imaging. Femoral Vein: No evidence of thrombus. Normal compressibility, respiratory phasicity and response to augmentation. Popliteal Vein: No evidence of thrombus. Normal compressibility, respiratory phasicity and response to augmentation. Calf Veins: No evidence of thrombus. Normal  compressibility and flow on color Doppler imaging. Superficial Great Saphenous Vein: No evidence of thrombus. Normal compressibility. Venous Reflux:  None. Other Findings:  None. LEFT LOWER EXTREMITY Common Femoral Vein: No evidence of thrombus. Normal compressibility, respiratory phasicity and response to augmentation. Saphenofemoral Junction: No evidence of thrombus. Normal compressibility and flow on color Doppler imaging. Profunda Femoral Vein: No evidence of thrombus. Normal compressibility and flow on color Doppler imaging. Femoral Vein: No evidence of thrombus. Normal compressibility, respiratory phasicity and response to augmentation. Popliteal Vein: No evidence of thrombus. Normal compressibility, respiratory phasicity and response to augmentation. Calf Veins: No evidence of thrombus. Normal compressibility and flow on color  Doppler imaging. Superficial Great Saphenous Vein: No evidence of thrombus. Normal compressibility. Venous Reflux:  None. Other Findings:  None. IMPRESSION: No evidence DVT within either lower extremity. Electronically Signed   By: Simonne Come M.D.   On: 01/29/2018 10:46   Dg Chest Port 1 View  Result Date: 01/29/2018 CLINICAL DATA:  Shortness of breath. EXAM: PORTABLE CHEST 1 VIEW COMPARISON:  01/27/2018. FINDINGS: NG tube noted coiled stomach. Cardiomegaly scratched it stable cardiomegaly. No pulmonary venous congestion. Persistent worrisome pulmonary nodule left upper lobe. Again malignancy could present this fashion. Low lung volumes. Small left pleural effusion. No pneumothorax. IMPRESSION: 1.  NG tube noted coiled stomach. 2. Persistent left upper lobe nodule, again malignancy could present this fashion. Further evaluation suggested. 3.  Low lung volumes.  Small left pleural effusion. Electronically Signed   By: Maisie Fus  Register   On: 01/29/2018 10:00   [place] Ekg Site Rite  Result Date: 01/28/2018 If Site Rite image not attached, placement could not be confirmed due to  current cardiac rhythm.   Cardiac Studies   TTE 01/28/2018 - Left ventricle: The cavity size was normal. Wall thickness was increased in a pattern of mild LVH. Systolic function was normal. The estimated ejection fraction was in the range of 55% to 65%. - Aortic valve: Valve area (Vmax): 2.21 cm^2. - Mitral valve: There was mild regurgitation. - Right atrium: The atrium was mildly dilated. - Tricuspid valve: There was moderate regurgitation.   Patient Profile     77 y.o. female DM2, HLD, HTN who is being seen s/p bilateral ureteral stents by Dr. Annabell Howells w/ b/l nephrosis and possible bladder cancer sus[ected and acute on chronic kidney injury / chronic UTI for the evaluation of newly diagnosed Atrial Fibrillation with RVR (of unknown onset) and at the request of Dr. Sherryll Burger.  Assessment & Plan    Atrial Fibrillation with Rapid Ventricular Rate, unknown date of onset - No reported CP today. No SOB - Echo reviewed with patient and husband and as above - hyperdynamic LV contraction. RV appears dilated with mod-severe pulonary HTN: chronic lung disease v. PE. Future workup might include CT angio if Cr returns to baseline. - TSH 1.949 (9/23); Potassium 3.2  4.3  3.9  3.9  4.7  3.9; Magnesium 2.1  2.0  1.8  1.6  2.0;  - Renal function improving today 2.44  2.62  2.46  2.42  2.31  1.96 (baseline ~1.3). - Afib with RVR of unknown date of onset -rates likely exacerbated by hypotension and and chronic kidney infection. Consider rebound tachycardia in setting of emesis / poor intake of medication.  - CHA2DS2VASc score of at least  5 (HTN, age x2, DM, Female). - Plan: Continue lopressor 2.5mg  q6h with hold parameters. If can transition to PO, start metoprolol tartrate 12.5 q6h with escalation of dose q6h as needed to achieve HR less than 110. Continue heparin given Afib / cannot r/o PE. She will need long term anticoagulation at discharge. Continue to monitor vitals, daily CBC (on heparin), daily BMET  to monitor electrolytes and renal function/ kidney injury. Gentle IVF d/t RV strain as noted in echo.   Acute on Chronic Renal Failure -Cr improved this AM. Caution with fluids. Monitor I&O's / urinary output / weights. Daily BMP. Caution with nephrotoxic agents. Hold metformin containing medications (Metaglip), diuretics & restart if renal function returns to baseline. Renal dosing for any renally cleared medications. - Continue to monitor electrolytes - Per IM, nephrology  HTN  - BP  129/74. Controlled. Home medications include atenolol and maxide-25 - continue to hold. - Continue Lopressor with HR and BP hold parameters as above - Continue to monitor vitals  HLD - Home statin - currently held. Restart statin as labs show normal liver function.  - Per IM  DM2 - Hyperglycemic - SSI - Continue to hold metformin d/t renal insufficiency - Per IM  Remaining per IM   For questions or updates, please contact [facility] Please consult [email or web address] for contact info under        Signed, Lennon Alstrom, PA-C  01/30/2018, 7:47 AM

## 2018-01-30 NOTE — Progress Notes (Signed)
PT Cancellation Note  Patient Details Name: Tiffany LangeBarbara Jacob MRN: [id number] DOB: 08/20/1940   Cancelled Treatment:     Per cardiologist resting HR to remain less than 110. Upon entry pt was asleep, when PT woke her she was lethargic kept dozing off when speaking with her. Resting HR ranged between 107-109. Will attempt eval at later date when pt is awake and appropriate.  Arvilla MeresSarah Orlandis Sanden, SPT   Arvilla MeresSarah Dorisann Schwanke 01/30/2018, 2:06 PM

## 2018-01-30 NOTE — Progress Notes (Signed)
Doing much better No vomiting    OBJECTIVE: Patient Vitals for the past 24 hrs:  BP Temp Temp src Pulse Resp SpO2  01/30/18 0917 - 98.2 F (36.8 C) Oral - - -  01/30/18 0449 129/74 - - (!) 104 19 99 %  01/30/18 0300 - - - (!) 110 17 98 %  01/30/18 0200 - - - (!) 111 19 100 %  01/30/18 0100 - - - (!) 105 (!) 21 100 %  01/30/18 0037 135/89 - - (!) 126 (!) 23 100 %  01/29/18 2300 - - - - (!) 22 -  01/29/18 2200 - - - (!) 112 17 100 %  01/29/18 2013 (!) 148/90 98.2 F (36.8 C) Oral (!) 122 15 100 %  01/29/18 1600 (!) 142/97 - - - (!) 21 -  01/29/18 1500 - - - (!) 116 (!) 24 -  01/29/18 1400 107/70 - - (!) 118 17 -  01/29/18 1200 127/77 - - - 19 -  01/29/18 1100 (!) 111/59 - - (!) 111 18 -   Physical Exam Constitutional: awake and alert  Eyes-PERL ENT- tongue moist No sinus tenderness CVS- s1s2 tachycardia RS b/l air entry decreased bases GI- abd soft GU- foley in place MSK-moves all limbs SKIN- ulcer over the left leg laterally  Neurologic-non focal Psychiatric-cannot assess Lymphatic-  Lab Results CBC Latest Ref Rng & Units 01/30/2018 01/29/2018 01/28/2018  WBC 3.6 - 11.0 K/uL 11.8(H) 12.0(H) 14.1(H)  Hemoglobin 12.0 - 16.0 g/dL 8.2(L) 8.8(L) 9.8(L)  Hematocrit 35.0 - 47.0 % 25.4(L) 27.1(L) 30.2(L)  Platelets 150 - 440 K/uL 215 271 327    CMP Latest Ref Rng & Units 01/30/2018 01/29/2018 01/28/2018  Glucose 70 - 99 mg/dL 914(N) 829(F) 621(H)  BUN 8 - 23 mg/dL 08(M) 57(Q) 46(N)  Creatinine 0.44 - 1.00 mg/dL 6.29(B) 2.84(X) 3.24(M)  Sodium 135 - 145 mmol/L 147(H) 146(H) 144  Potassium 3.5 - 5.1 mmol/L 3.9 4.7 3.9  Chloride 98 - 111 mmol/L 120(H) 117(H) 116(H)  CO2 22 - 32 mmol/L 20(L) 17(L) 18(L)  Calcium 8.9 - 10.3 mg/dL 7.8(L) 7.6(L) 7.4(L)  Total Protein 6.5 - 8.1 g/dL - 5.9(L) -  Total Bilirubin 0.3 - 1.2 mg/dL - 0.7 -  Alkaline Phos 38 - 126 U/L - 67 -  AST 15 - 41 U/L - 19 -  ALT 0 - 44 U/L - 14 -    Microbiology: 9/22 BC- 1 of 2 coag neg  staph Urine culture- e.coli pan sensitive except ampicillin-  Radiographs and labs were personally reviewed by me.   Assessment and Plan 77 y.o. female with h/o DM, HTN, is admitted with severe weakness, poor appetite and near fall. As per her husband she has not been feeling well for more than 2 weeks. Has had no appetite, feeling very weak, passing dark urine, went to her PCP and urine checked and was given antibiotics, yesterday slid to the floor when she tried to get up and husband could not pick her up and called 911. She has been having black vomit for the past 2 days. In the ED Bp 90/45, temp 96.5, HR 130. Cultures sent and started on vanco and cefepime. Was found to have metabolic acidosis, AKI and hyperglycemia and DKA. As per the hospitalist her PCP had mentioned it was e.coli  Pretty sensitive organism She was admitted to ICU. Ct abdomen and pelvis revealed B/l hydroureteronephrosis and she has been seen by urologist and is going for stent placement.  Severe urosepsis -  has b/l hydroureteronephrosis- apyonephrosis left kidney s/p stent - e.coli in urine pan sensitive- change cefepime to ceftriaxone  Coag neg staph in blood is likely a skin contaminant ( 2 different species homina and capitis- will DC vanco)  AKI on ckd- combination of dehydration, sepsis and obstruction   DKA- being corrected  Hypokalemia  Being corrected Anemia  Upper GI bleed-? Stress related- may need endoscopy  Non healing wound left leg x 4 yrs after a cat scratch- may need biopsy? Discussed the management with her family and intensivist

## 2018-01-30 NOTE — Progress Notes (Signed)
[facility] [place], [place] 01/30/18  Subjective:   Remains critically ill B/l ureteral stents placed this admission. Left pyonephrosis suspected Patient nauseous but able to take a few sips of refused this morning Serum creatinine slightly improved down to 1.96.  Urine output 1150 cc  Objective:  Vital signs in last 24 hours:  Temp:  [98.2 F (36.8 C)] 98.2 F (36.8 C) (09/25 0917) Pulse Rate:  [104-126] 104 (09/25 0449) Resp:  [15-24] 19 (09/25 0449) BP: (107-148)/(59-97) 129/74 (09/25 0449) SpO2:  [98 %-100 %] 99 % (09/25 0449)  Weight change:  Filed Weights   01/27/18 0840 01/27/18 1122  Weight: 54.4 kg 57.4 kg    Intake/Output:    Intake/Output Summary (Last 24 hours) at 01/30/2018 1050 Last data filed at 01/30/2018 0449 Gross per 24 hour  Intake 1170 ml  Output 1150 ml  Net 20 ml     Physical Exam: General: Critically ill appearing  HEENT Dry oral mucus membranes  Neck supple  Pulm/lungs Decreased breath sounds at bases  CVS/Heart Tachycardic, irregular  Abdomen:  Soft, NT  Extremities: trace edema  Neurologic: Alert, follows few simple commands  Skin: warm   Foley in place       Basic Metabolic Panel:  Recent Labs  Lab 01/27/18 1905  01/28/18 0839 01/28/18 1313 01/28/18 1627 01/28/18 2050 01/29/18 0432 01/30/18 0218  NA 140   < > 148* 143 146* 144 146* 147*  K 3.2*   < > 3.2* 4.3 3.9 3.9 4.7 3.9  CL 117*   < > 114* 115* 114* 116* 117* 120*  CO2 <7*   < > 20* 15* 19* 18* 17* 20*  GLUCOSE 203*   < > 159* 317* 224* 127* 256* 135*  BUN 83*   < > 71* 72* 68* 67* 59* 48*  CREATININE 2.60*   < > 2.44* 2.62* 2.46* 2.42* 2.31* 1.96*  CALCIUM 8.0*   < > 8.0* 7.6* 7.8* 7.4* 7.6* 7.8*  MG 2.1  --  2.0 1.8  --   --  1.6* 2.0  PHOS 4.7*  --  1.6*  --   --   --  2.2* 2.4*   < > = values in this interval not displayed.     CBC: Recent Labs  Lab 01/27/18 0845 01/27/18 1905 01/28/18 0257 01/29/18 0432 01/30/18 0218  WBC  22.3*  --  14.1* 12.0* 11.8*  NEUTROABS 19.5*  --   --  9.7*  --   HGB 11.7* 10.2* 9.8* 8.8* 8.2*  HCT 37.2 32.0* 30.2* 27.1* 25.4*  MCV 97.1  --  92.5 93.3 93.7  PLT 491*  --  327 271 215     No results found for: HEPBSAG, HEPBSAB, HEPBIGM    Microbiology:  Recent Results (from the past 240 hour(s))  Blood Culture (routine x 2)     Status: Abnormal   Collection Time: 01/27/18  8:40 AM  Result Value Ref Range Status   Specimen Description   Final    BLOOD RIGHT ANTECUBITAL Performed at [facility], [address] [place], [place] [address]    Special Requests   Final    BOTTLES DRAWN AEROBIC AND ANAEROBIC Blood Culture adequate volume Performed at [facility], [address] [place], [place] [address]    Culture  Setup Time   Final    AEROBIC BOTTLE ONLY GRAM POSITIVE COCCI CRITICAL RESULT CALLED TO, READ BACK BY AND VERIFIED WITH: MATT [facility] 01/28/18 0618 REC Performed at  [facility], [address], [place], [place] [address]    Culture STAPHYLOCOCCUS HOMINIS (A)  Final   Report Status 01/30/2018 FINAL  Final   Organism ID, Bacteria STAPHYLOCOCCUS HOMINIS  Final      Susceptibility   Staphylococcus hominis - MIC*    CIPROFLOXACIN >=8 RESISTANT Resistant     ERYTHROMYCIN <=0.25 SENSITIVE Sensitive     GENTAMICIN <=0.5 SENSITIVE Sensitive     OXACILLIN >=4 RESISTANT Resistant     TETRACYCLINE >=16 RESISTANT Resistant     VANCOMYCIN <=0.5 SENSITIVE Sensitive     TRIMETH/SULFA >=320 RESISTANT Resistant     CLINDAMYCIN <=0.25 SENSITIVE Sensitive     RIFAMPIN <=0.5 SENSITIVE Sensitive     Inducible Clindamycin NEGATIVE Sensitive     * STAPHYLOCOCCUS HOMINIS  Blood Culture ID Panel (Reflexed)     Status: Abnormal   Collection Time: 01/27/18  8:40 AM  Result Value Ref Range Status   Enterococcus species NOT DETECTED NOT DETECTED Final   Listeria monocytogenes NOT DETECTED NOT DETECTED Final   Staphylococcus species DETECTED (A) NOT  DETECTED Final    Comment: Methicillin (oxacillin) resistant coagulase negative staphylococcus. Possible blood culture contaminant (unless isolated from more than one blood culture draw or clinical case suggests pathogenicity). No antibiotic treatment is indicated for blood  culture contaminants. CRITICAL RESULT CALLED TO, READ BACK BY AND VERIFIED WITH: MATT [facility] 01/28/18 0618 REC    Staphylococcus aureus NOT DETECTED NOT DETECTED Final   Methicillin resistance DETECTED (A) NOT DETECTED Final    Comment: CRITICAL RESULT CALLED TO, READ BACK BY AND VERIFIED WITH: MATT [facility] 01/28/18 0618 REC    Streptococcus species NOT DETECTED NOT DETECTED Final   Streptococcus agalactiae NOT DETECTED NOT DETECTED Final   Streptococcus pneumoniae NOT DETECTED NOT DETECTED Final   Streptococcus pyogenes NOT DETECTED NOT DETECTED Final   Acinetobacter baumannii NOT DETECTED NOT DETECTED Final   Enterobacteriaceae species NOT DETECTED NOT DETECTED Final   Enterobacter cloacae complex NOT DETECTED NOT DETECTED Final   Escherichia coli NOT DETECTED NOT DETECTED Final   Klebsiella oxytoca NOT DETECTED NOT DETECTED Final   Klebsiella pneumoniae NOT DETECTED NOT DETECTED Final   Proteus species NOT DETECTED NOT DETECTED Final   Serratia marcescens NOT DETECTED NOT DETECTED Final   Haemophilus influenzae NOT DETECTED NOT DETECTED Final   Neisseria meningitidis NOT DETECTED NOT DETECTED Final   Pseudomonas aeruginosa NOT DETECTED NOT DETECTED Final   Candida albicans NOT DETECTED NOT DETECTED Final   Candida glabrata NOT DETECTED NOT DETECTED Final   Candida krusei NOT DETECTED NOT DETECTED Final   Candida parapsilosis NOT DETECTED NOT DETECTED Final   Candida tropicalis NOT DETECTED NOT DETECTED Final    Comment: Performed at [facility], [address] [place], [place] [address]  Blood Culture (routine x 2)     Status: None (Preliminary result)   Collection Time: 01/27/18  8:46 AM  Result  Value Ref Range Status   Specimen Description   Final    BLOOD LEFT ANTECUBITAL Performed at [facility], [address] [place], [place] [address]    Special Requests   Final    BOTTLES DRAWN AEROBIC AND ANAEROBIC Blood Culture adequate volume Performed at [facility], [address] [place], [place] [address]    Culture  Setup Time   Final    GRAM POSITIVE COCCI AEROBIC BOTTLE ONLY CRITICAL VALUE NOTED.  VALUE IS CONSISTENT WITH PREVIOUSLY REPORTED AND CALLED VALUE. Performed at [facility], [address] [place], [place] [address]    Culture GRAM POSITIVE COCCI  Final   Report Status PENDING  Incomplete  Urine Culture     Status: Abnormal   Collection Time: 01/27/18  9:45 AM  Result Value Ref Range Status   Specimen Description   Final    URINE, CATHETERIZED Performed at [facility], [address] [place], [place] [address]    Special Requests   Final    NONE Performed at [facility], [address] [place], [place] [address]    Culture >=100,000 COLONIES/mL ESCHERICHIA COLI (A)  Final   Report Status 01/30/2018 FINAL  Final   Organism ID, Bacteria ESCHERICHIA COLI (A)  Final      Susceptibility   Escherichia coli - MIC*    AMPICILLIN 16 INTERMEDIATE Intermediate     CEFAZOLIN <=4 SENSITIVE Sensitive     CEFTRIAXONE <=1 SENSITIVE Sensitive     CIPROFLOXACIN <=0.25 SENSITIVE Sensitive     GENTAMICIN <=1 SENSITIVE Sensitive     IMIPENEM <=0.25 SENSITIVE Sensitive     NITROFURANTOIN 32 SENSITIVE Sensitive     TRIMETH/SULFA <=20 SENSITIVE Sensitive     AMPICILLIN/SULBACTAM 8 SENSITIVE Sensitive     PIP/TAZO <=4 SENSITIVE Sensitive     Extended ESBL NEGATIVE Sensitive     * >=100,000 COLONIES/mL ESCHERICHIA COLI  MRSA PCR Screening     Status: None   Collection Time: 01/27/18 11:25 AM  Result Value Ref Range Status   MRSA by PCR NEGATIVE NEGATIVE Final    Comment:        The GeneXpert MRSA Assay  (FDA approved for NASAL specimens only), is one component of a comprehensive MRSA colonization surveillance program. It is not intended to diagnose MRSA infection nor to guide or monitor treatment for MRSA infections. Performed at [facility], [address] [place], [place] [address]   C difficile quick scan w PCR reflex     Status: None   Collection Time: 01/28/18  1:14 PM  Result Value Ref Range Status   C Diff antigen NEGATIVE NEGATIVE Final   C Diff toxin NEGATIVE NEGATIVE Final   C Diff interpretation No C. difficile detected.  Final    Comment: Performed at [facility], [address] [place], [place] [address]  Urine Culture     Status: None   Collection Time: 01/28/18  6:31 PM  Result Value Ref Range Status   Specimen Description   Final    URINE, RANDOM Performed at [facility], [address] [place], [place] [address]    Special Requests RIGHT RENAL PELVIS  Final   Culture   Final    NO GROWTH Performed at [facility], [address], [place], [place] [address]    Report Status 01/30/2018 FINAL  Final  Urine Culture     Status: Abnormal (Preliminary result)   Collection Time: 01/28/18  6:33 PM  Result Value Ref Range Status   Specimen Description   Final    URINE, RANDOM Performed at [facility], [address], [place], [place] [address]    Special Requests   Final    LEFT KIDNEY Performed at [facility], [address], [place], [place] [address]    Culture 10,000 COLONIES/mL GRAM NEGATIVE RODS (A)  Final   Report Status PENDING  Incomplete  CULTURE, BLOOD (ROUTINE X 2) w Reflex to ID Panel     Status: None (Preliminary result)  Collection Time: 01/29/18  9:32 AM  Result Value Ref Range Status   Specimen Description BLOOD RIGH HAND  Final   Special Requests   Final    BOTTLES DRAWN AEROBIC AND ANAEROBIC Blood Culture adequate volume   Culture   Final    NO GROWTH < 24 HOURS Performed at  [facility], [address], [place], [place] [address]    Report Status PENDING  Incomplete  CULTURE, BLOOD (ROUTINE X 2) w Reflex to ID Panel     Status: None (Preliminary result)   Collection Time: 01/29/18  9:43 AM  Result Value Ref Range Status   Specimen Description BLOOD LEFT HAND  Final   Special Requests   Final    BOTTLES DRAWN AEROBIC AND ANAEROBIC Blood Culture adequate volume   Culture   Final    NO GROWTH < 24 HOURS Performed at [facility], [address], [place], [place] [address]    Report Status PENDING  Incomplete    Coagulation Studies: Recent Labs    01/29/18 0432  LABPROT 14.9  INR 1.18    Urinalysis: No results for input(s): COLORURINE, LABSPEC, PHURINE, GLUCOSEU, HGBUR, BILIRUBINUR, KETONESUR, PROTEINUR, UROBILINOGEN, NITRITE, LEUKOCYTESUR in the last 72 hours.  Invalid input(s): APPERANCEUR    Imaging: Nm Pulmonary Perf And Vent  Result Date: 01/29/2018 CLINICAL DATA:  Suspected pulmonary embolus. Intermediate probability. Negative D-dimer. EXAM: NUCLEAR MEDICINE VENTILATION - PERFUSION LUNG SCAN TECHNIQUE: Ventilation images were obtained in multiple projections using inhaled aerosol Tc-62m DTPA. Perfusion images were obtained in multiple projections after intravenous injection of Tc-30m-MAA. RADIOPHARMACEUTICALS:  34.5 mCi of Tc-44m DTPA aerosol inhalation and 4.3 mCi Tc5m-MAA IV COMPARISON:  Chest x-ray FINDINGS: Patchy ventilation perfusion matched defects with ventilation defects most more prominent than perfusion defects. This is a low probability for pulmonary embolus. IMPRESSION: Low probability scan for pulmonary embolus. Electronically Signed   By: Maisie Fus  Register   On: 01/29/2018 13:26   US Venous Img Lower Bilateral  Result Date: 01/29/2018 CLINICAL DATA:  Bilateral lower extremity pain and edema for the past week. Evaluate for DVT. EXAM: BILATERAL LOWER EXTREMITY VENOUS DOPPLER ULTRASOUND TECHNIQUE: Gray-scale  sonography with graded compression, as well as color Doppler and duplex ultrasound were performed to evaluate the lower extremity deep venous systems from the level of the common femoral vein and including the common femoral, femoral, profunda femoral, popliteal and calf veins including the posterior tibial, peroneal and gastrocnemius veins when visible. The superficial great saphenous vein was also interrogated. Spectral Doppler was utilized to evaluate flow at rest and with distal augmentation maneuvers in the common femoral, femoral and popliteal veins. COMPARISON:  None. FINDINGS: RIGHT LOWER EXTREMITY Common Femoral Vein: No evidence of thrombus. Normal compressibility, respiratory phasicity and response to augmentation. Saphenofemoral Junction: No evidence of thrombus. Normal compressibility and flow on color Doppler imaging. Profunda Femoral Vein: No evidence of thrombus. Normal compressibility and flow on color Doppler imaging. Femoral Vein: No evidence of thrombus. Normal compressibility, respiratory phasicity and response to augmentation. Popliteal Vein: No evidence of thrombus. Normal compressibility, respiratory phasicity and response to augmentation. Calf Veins: No evidence of thrombus. Normal compressibility and flow on color Doppler imaging. Superficial Great Saphenous Vein: No evidence of thrombus. Normal compressibility. Venous Reflux:  None. Other Findings:  None. LEFT LOWER EXTREMITY Common Femoral Vein: No evidence of thrombus. Normal compressibility, respiratory phasicity and response to augmentation. Saphenofemoral Junction: No evidence of thrombus. Normal compressibility and flow on color Doppler imaging. Profunda Femoral Vein: No evidence  of thrombus. Normal compressibility and flow on color Doppler imaging. Femoral Vein: No evidence of thrombus. Normal compressibility, respiratory phasicity and response to augmentation. Popliteal Vein: No evidence of thrombus. Normal compressibility,  respiratory phasicity and response to augmentation. Calf Veins: No evidence of thrombus. Normal compressibility and flow on color Doppler imaging. Superficial Great Saphenous Vein: No evidence of thrombus. Normal compressibility. Venous Reflux:  None. Other Findings:  None. IMPRESSION: No evidence DVT within either lower extremity. Electronically Signed   By: Simonne Come M.D.   On: 01/29/2018 10:46   Dg Chest Port 1 View  Result Date: 01/29/2018 CLINICAL DATA:  Shortness of breath. EXAM: PORTABLE CHEST 1 VIEW COMPARISON:  01/27/2018. FINDINGS: NG tube noted coiled stomach. Cardiomegaly scratched it stable cardiomegaly. No pulmonary venous congestion. Persistent worrisome pulmonary nodule left upper lobe. Again malignancy could present this fashion. Low lung volumes. Small left pleural effusion. No pneumothorax. IMPRESSION: 1.  NG tube noted coiled stomach. 2. Persistent left upper lobe nodule, again malignancy could present this fashion. Further evaluation suggested. 3.  Low lung volumes.  Small left pleural effusion. Electronically Signed   By: Maisie Fus  Register   On: 01/29/2018 10:00     Medications:   . sodium chloride 75 mL/hr at 01/30/18 0025  . ceFEPime (MAXIPIME) IV 1 g (01/30/18 1004)  . famotidine (PEPCID) IV 20 mg (01/30/18 1001)  . heparin 800 Units/hr (01/29/18 1221)  . vancomycin     . chlorhexidine  15 mL Mouth Rinse BID  . insulin aspart  0-9 Units Subcutaneous Q4H  . insulin glargine  14 Units Subcutaneous QHS  . mouth rinse  15 mL Mouth Rinse q12n4p  . metoprolol tartrate  2.5 mg Intravenous Q6H   acetaminophen **OR** acetaminophen, ondansetron **OR** ondansetron (ZOFRAN) IV, promethazine  Assessment/ Plan:  77 y.o. caucasian female with medical problems of Diabetes, HTN, HLD, salivary gland stones who was admitted to [facility] on 01/27/2018 for evaluation of generalized fatigue and is Dx with severe sepsis, UTI and ARF.  1. ARF, multifactorial 2. B/l hydronephrosis- b/l  ureteral stent placed 9/23 by Dr Annabell Howells 3. Sepsis, Gram positive bacteremia   4. Acidosis, Hypernatremia, Hypokalemia 5. UTI- E Coli 6. CKD st 3, Baseline Cr 1.38/ GFR 36 from jan 2019 7. DM-2 with CKD 8. New onset A Fib 9. Enlarging LUL nodule 10. 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 Patient is s/p b/l ureteral stent placement. Left pyonephrosis is suspected. S Creatinine and urine output have improved Electrolyte correction as per ICU team No acute indication of HD at present avoid nephrotoxins, hypotension Treatment of sepsis as per ICU team Will follow   LOS: 3 Alexzandria Massman Thedore Mins 9/25/201910:50 AM  [facility], [place] [phone]  Note: This note was prepared with Dragon dictation. Any transcription errors are unintentional

## 2018-01-30 NOTE — Progress Notes (Signed)
Tiffany Manning, M.D Pulmonary Critical Care & Sleep Medicine   ICU Patient Progress Note  Name: Tiffany Manning  DOB: [birth year]  MRN: [id number]  Date: 01/30/2018   _0 I have reviewed the flowsheet and previous day's notes.  IMPRESSION:  Patient Active Problem List   Diagnosis Date Noted  . Atrial fibrillation with rapid ventricular response ([place])   . Severe sepsis ([place]) 01/27/2018  . Pressure injury of skin 01/27/2018  . 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 Protein-calorie malnutrition, severe 05/29/2017  . Acute renal failure ([place]) 05/28/2017  . Acute renal failure (ARF) ([place]) 05/28/2017  1, Severe sepsis with lactic acidosis related to UTI/urosepsis Blood and urine c/s.-Coagulation negative staph with GNR in the urine broad spectrum abx,vancomycin, Pseudomonas is sensitive to Rocephin, switch cefepime to Rocephin Infectious disease and urology following-DC Foley per urology Not requiring any pressors  2.Acute renal failure, ATN vs obstruction,  -- K replaced -- Monitor CK-178 -- Monitor BMP and magnesium  3. DKA , improved currently on Lantus and sliding scale coverage-doing well  4. Elevated Lipase most likely due to stress -- CT abd not suggestive of any pancreatitis -- Will need out patient eval on splenic abnormality  5.CARDIOVASCULAR -- Afib, ? New onset, TSH and free T4 is okay, echo initially cardiology showed severe pulmonary hypertension but was not reported on the report it showed moderate TR, DVT studies negative VQ scan is low probability suggest against possibility of PE, further management per cardiology start metoprolol p.o. and adjust accordingly  6.GASTROINTESTINAL N/V related to DKA, improved, DC NG tube, start p.o. diet   7.NEUROLOGIC, no defecits  8.  Started on low-dose beta-blocker if able to take p.o. switch to p.o. beta-blocker  9. Enlarging LUL nodule, will need to be addressed as outpt PET CT vs CT FNA vs resection  PLAN:    TTS:VXBL Cardiology consult, metoprolol 12.5 p.o. every 6 if required start on Cardizem p.o. RS:On RA doing well -- Upper lobe nodules seen on 2015 CT at [facility] also showed LUL spiculated nodule which will need out patient follow up -- Aspiration precaution TJ:QZESP and Rocephin adjust per ID recommendation -- Follow culture and adjust - Repeat cultures negative so far ENDO:Anion gap is closed and started on Lantus and subcu insulin -- SSI monitor blood sugar QZ:RAQTMA is improved-start p.o. diet, continue Pepcid RENAL:Cr improving, Urology consult appreciated- on the report this suggested possibility of malignancy will defer further work-up to urology - Renal consult appreciated-- Electrolyte replacement protocol, Monitor Cr and K  CNS:no issues  HEMATOLOGY:monitor hb -- Monitor HB and PLT MUSCULOSKELETAL CK is okay PAIN AND SEDATION:PRN meds  Skin/Wound: Chronich changes   Electrolytes: Replace electrolytes per ICU electrolyte replacement protocol.   IVF: Half-normal saline at 75  Nutrition: Start diet  Prophylaxis: DVT Prophylaxis with heparin,. GI Prophylaxis.   Restraints: none  PT/OT eval and treat. OOB when appropriate.   Lines/Tubes:  9/22 foley no central line-DC Foley per urology ADVANCE DIRECTIVE:full code  FAMILY DISCUSSION:spoke with husband at length Quality Care: PPI, DVT prophylaxis, HOB elevated, Infection control all reviewed and addressed. Events and notes from last 24 hours reviewed. Care plan discussed on multidisciplinary rounds CC TIME: 35 min   Overall patient is doing well, only acute issue going on his A. fib, cardiology is following, currently not on any pressors on room air, sepsis seems to be under control but it is getting better, patient can be transferred out of ICU to telemetry floor.-We will sign off  please call us back if he can be of any help OT and PT consult is called   Subjective:  HISTORY OF PRESENT ILLNESS    Tiffany Manning  a77 y.o.femalewith a known history of diabetes, hypertension, hyperlipidemia who presents to the ER due to generalized weakness fatigue and just not feeling well over the past week. Patient has had significant malaise and just generalized weakness over the past week progressively getting worse went to see her primary care physician and was placed on oral antibiotic( nitrofurantoin as per pt) for suspected UTI. She continued to feel poorly and was not getting better and felt worse today and decided to come to ER. She has been nauseated and has been throwing up and unable to keep anything down. In the ER she was noted to be hypotensive, tachycardic, noted to have a leukocytosis suspected to have severe sepsis secondary to UTI and hospitalist services were contacted for admission. . In the emergency room patient was noted to be in acute kidney injury with severe hyperglycemia, and noted to be acidotic. She was assesed to have DKA, severe sepsis, ARF and UTI and transferred to ICU. In the ICU she cont to feel poorly with severe weakness with ongoing nausea, has not vomited so for in the icu.she denies scough sob or CP. She denies fever but reports chills over the last week. An in/out foley in ER had revealed purulent urine  Antimicrobials this admission: 9/22 Vancomycin >>  9/22 Cefepime >>   Microbiology results: 9/22 BCx: Coag negative staph 9/22 UCx: GNR  Subjective: Status post bilateral stent placement by urology, abnormality reported around the bladder concerning for malignancy -A. fib with RVR, cardiology is following-started on p.o. metoprolol - Blood sugar/anion gap is improved transition to Lantus and subcu insulin Cr is improving 3.3 L positive, urine output is improving DVT study and VQ scan were done-no suggestion of acute PE -  Significant event: 9-22 admission 9-22 CT abd showed no pancreatitis, ? Splenic band like densitiy, RML nodule looks slightly bigger than 2013 923:  Status post bilateral stent placement 923: ID consult space- continue same antibiotics, nephrology suggested to follow along, 01/29/2018: A. fib with RVR echo done pending results, cardiology consult is called started on heparin drip  ALLERGIES: Allergies  Allergen Reactions  . Sulfa Antibiotics Nausea And Vomiting     Vital Signs:   BP 129/74   Pulse (!) 104   Temp 98.2 F (36.8 C) (Oral)   Resp 19   Ht 5' (1.524 m)   Wt 57.4 kg   SpO2 99%   BMI 24.71 kg/m       Prehospital Care Cardiac Rhythm: Atrial fibrillation  Temp (24hrs), Avg:98.2 F (36.8 C), Min:98.2 F (36.8 C), Max:98.2 F (36.8 C)   Intake/Output:Last shift:      09/25 0701 - 09/25 1900 In: -  Out: 450 [Urine:450]Last 3 shifts: 09/23 1901 - 09/25 0700 In: 3442.5 [P.O.:140; I.V.:3052.5] Out: 1632 [Urine:1630]  Intake/Output Summary (Last 24 hours) at 01/30/2018 1330 Last data filed at 01/30/2018 1315 Gross per 24 hour  Intake 1150 ml  Output 1600 ml  Net -450 ml    Ventilator Settings:  's  Objective:   Physical Exam:  General: comfortable, no acute distress No edema,cyanosis,clubbing or pallor.no jaundice Mouth dry, pupils equal and reactive cvs s1 and s2, tachycardic with PVCs Chest CTA without wheezing or sig crackles abd soft non distended .+ tenderness left flank/renal are.+ BS. No organomegaly Cns awake and alert x 3 and  appropiate non focal, moves all extremeties LLE  4x2 inches superficial ulcer in the mid leg lateral side with exudate  DATA:    Telemetry: _0 Sinus _1 A-flutter _2 Paced   _3 A-fib _4 Multiple PVC's   Labs:  CBC w/Diff Recent Labs  Lab 01/28/18 0257 01/29/18 0432 01/30/18 0218  HGB 9.8* 8.8* 8.2*  HCT 30.2* 27.1* 25.4*  WBC 14.1* 12.0* 11.8*  PLT 327 271 215     Chemistry Recent Labs  Lab 01/28/18 2050 01/29/18 0432 01/30/18 0218  NA 144 146* 147*  K 3.9 4.7 3.9  CL 116* 117* 120*  CO2 18* 17* 20*  BUN 67* 59* 48*  CREATININE 2.42* 2.31* 1.96*   GLUCOSE 127* 256* 135*    Hepatic Function Latest Ref Rng & Units 01/29/2018 01/27/2018 05/28/2017  Total Protein 6.5 - 8.1 g/dL 5.9(L) 7.9 8.3(H)  Albumin 3.5 - 5.0 g/dL 2.7(L) 3.8 3.3(L)  AST 15 - 41 U/L _5 ALT 0 - 44 U/L 14 23 10(L)  Alk Phosphatase 38 - 126 U/L 67 104 81  Total Bilirubin 0.3 - 1.2 mg/dL 0.7 1.0 0.6     Lactic Acid    Component Value Date/Time   LATICACIDVEN 1.9 01/29/2018 0432     Micro  _6 @ Recent Results (from the past 720 hour(s))  Blood Culture (routine x 2)     Status: Abnormal   Collection Time: 01/27/18  8:40 AM  Result Value Ref Range Status   Specimen Description   Final    BLOOD RIGHT ANTECUBITAL Performed at [facility], [address] [place], [place] [address]    Special Requests   Final    BOTTLES DRAWN AEROBIC AND ANAEROBIC Blood Culture adequate volume Performed at [facility], [address] [place], [place] [address]    Culture  Setup Time   Final    AEROBIC BOTTLE ONLY GRAM POSITIVE COCCI CRITICAL RESULT CALLED TO, READ BACK BY AND VERIFIED WITH: MATT [facility] 01/28/18 0618 REC Performed at [facility], [place] [address], [place], [place] [address]    Culture STAPHYLOCOCCUS HOMINIS (A)  Final   Report Status 01/30/2018 FINAL  Final   Organism ID, Bacteria STAPHYLOCOCCUS HOMINIS  Final      Susceptibility   Staphylococcus hominis - MIC*    CIPROFLOXACIN >=8 RESISTANT Resistant     ERYTHROMYCIN <=0.25 SENSITIVE Sensitive     GENTAMICIN <=0.5 SENSITIVE Sensitive     OXACILLIN >=4 RESISTANT Resistant     TETRACYCLINE >=16 RESISTANT Resistant     VANCOMYCIN <=0.5 SENSITIVE Sensitive     TRIMETH/SULFA >=320 RESISTANT Resistant     CLINDAMYCIN <=0.25 SENSITIVE Sensitive     RIFAMPIN <=0.5 SENSITIVE Sensitive     Inducible Clindamycin NEGATIVE Sensitive     * STAPHYLOCOCCUS HOMINIS  Blood Culture ID Panel (Reflexed)     Status: Abnormal   Collection Time: 01/27/18  8:40 AM  Result Value Ref Range  Status   Enterococcus species NOT DETECTED NOT DETECTED Final   Listeria monocytogenes NOT DETECTED NOT DETECTED Final   Staphylococcus species DETECTED (A) NOT DETECTED Final    Comment: Methicillin (oxacillin) resistant coagulase negative staphylococcus. Possible blood culture contaminant (unless isolated from more than one blood culture draw or clinical case suggests pathogenicity). No antibiotic treatment is indicated for blood  culture contaminants. CRITICAL RESULT CALLED TO, READ BACK BY AND VERIFIED WITH: MATT [facility] 01/28/18 0618 REC    Staphylococcus aureus NOT DETECTED NOT DETECTED Final   Methicillin resistance DETECTED (A)  NOT DETECTED Final    Comment: CRITICAL RESULT CALLED TO, READ BACK BY AND VERIFIED WITH: MATT [facility] 01/28/18 0618 REC    Streptococcus species NOT DETECTED NOT DETECTED Final   Streptococcus agalactiae NOT DETECTED NOT DETECTED Final   Streptococcus pneumoniae NOT DETECTED NOT DETECTED Final   Streptococcus pyogenes NOT DETECTED NOT DETECTED Final   Acinetobacter baumannii NOT DETECTED NOT DETECTED Final   Enterobacteriaceae species NOT DETECTED NOT DETECTED Final   Enterobacter cloacae complex NOT DETECTED NOT DETECTED Final   Escherichia coli NOT DETECTED NOT DETECTED Final   Klebsiella oxytoca NOT DETECTED NOT DETECTED Final   Klebsiella pneumoniae NOT DETECTED NOT DETECTED Final   Proteus species NOT DETECTED NOT DETECTED Final   Serratia marcescens NOT DETECTED NOT DETECTED Final   Haemophilus influenzae NOT DETECTED NOT DETECTED Final   Neisseria meningitidis NOT DETECTED NOT DETECTED Final   Pseudomonas aeruginosa NOT DETECTED NOT DETECTED Final   Candida albicans NOT DETECTED NOT DETECTED Final   Candida glabrata NOT DETECTED NOT DETECTED Final   Candida krusei NOT DETECTED NOT DETECTED Final   Candida parapsilosis NOT DETECTED NOT DETECTED Final   Candida tropicalis NOT DETECTED NOT DETECTED Final    Comment: Performed at [facility], [address] [place], [place] [address]  Blood Culture (routine x 2)     Status: None (Preliminary result)   Collection Time: 01/27/18  8:46 AM  Result Value Ref Range Status   Specimen Description   Final    BLOOD LEFT ANTECUBITAL Performed at [facility], [address], [place], [place] [address]    Special Requests   Final    BOTTLES DRAWN AEROBIC AND ANAEROBIC Blood Culture adequate volume Performed at [facility], [address] [place], [place] [address]    Culture  Setup Time   Final    GRAM POSITIVE COCCI AEROBIC BOTTLE ONLY CRITICAL VALUE NOTED.  VALUE IS CONSISTENT WITH PREVIOUSLY REPORTED AND CALLED VALUE. Performed at [facility], [address] [place], [place] [address]    Culture [facility] POSITIVE COCCI  Final   Report Status PENDING  Incomplete  Urine Culture     Status: Abnormal   Collection Time: 01/27/18  9:45 AM  Result Value Ref Range Status   Specimen Description   Final    URINE, CATHETERIZED Performed at [facility], [address] [place], [place] [address]    Special Requests   Final    NONE Performed at [facility], [address] [place], [place] [address]    Culture >=100,000 COLONIES/mL ESCHERICHIA COLI (A)  Final   Report Status 01/30/2018 FINAL  Final   Organism ID, Bacteria ESCHERICHIA COLI (A)  Final      Susceptibility   Escherichia coli - MIC*    AMPICILLIN 16 INTERMEDIATE Intermediate     CEFAZOLIN <=4 SENSITIVE Sensitive     CEFTRIAXONE <=1 SENSITIVE Sensitive     CIPROFLOXACIN <=0.25 SENSITIVE Sensitive     GENTAMICIN <=1 SENSITIVE Sensitive     IMIPENEM <=0.25 SENSITIVE Sensitive     NITROFURANTOIN 32 SENSITIVE Sensitive     TRIMETH/SULFA <=20 SENSITIVE Sensitive     AMPICILLIN/SULBACTAM 8 SENSITIVE Sensitive     PIP/TAZO <=4 SENSITIVE Sensitive     Extended ESBL NEGATIVE Sensitive     * >=100,000 COLONIES/mL ESCHERICHIA COLI  MRSA PCR Screening     Status: None    Collection Time: 01/27/18 11:25 AM  Result Value Ref Range Status  MRSA by PCR NEGATIVE NEGATIVE Final    Comment:        The GeneXpert MRSA Assay (FDA approved for NASAL specimens only), is one component of a comprehensive MRSA colonization surveillance program. It is not intended to diagnose MRSA infection nor to guide or monitor treatment for MRSA infections. Performed at [facility], [address] [place], [place] [address]   C difficile quick scan w PCR reflex     Status: None   Collection Time: 01/28/18  1:14 PM  Result Value Ref Range Status   C Diff antigen NEGATIVE NEGATIVE Final   C Diff toxin NEGATIVE NEGATIVE Final   C Diff interpretation No C. difficile detected.  Final    Comment: Performed at [facility], [address], [place], [place] [address]  Urine Culture     Status: None   Collection Time: 01/28/18  6:31 PM  Result Value Ref Range Status   Specimen Description   Final    URINE, RANDOM Performed at [facility], [address], [place], [place] [address]    Special Requests RIGHT RENAL PELVIS  Final   Culture   Final    NO GROWTH Performed at [facility], [place] [address] [place], [place] [address]    Report Status 01/30/2018 FINAL  Final  Urine Culture     Status: Abnormal (Preliminary result)   Collection Time: 01/28/18  6:33 PM  Result Value Ref Range Status   Specimen Description   Final    URINE, RANDOM Performed at [facility], [address], [place], [place] [address]    Special Requests LEFT KIDNEY  Final   Culture (A)  Final    10,000 COLONIES/mL ESCHERICHIA COLI SUSCEPTIBILITIES TO FOLLOW Performed at [facility], [place] [address], [place], [place] [address]    Report Status PENDING  Incomplete  CULTURE, BLOOD (ROUTINE X 2) w Reflex to ID Panel     Status: None (Preliminary result)   Collection Time: 01/29/18  9:32 AM  Result Value Ref Range Status   Specimen Description BLOOD  RIGH HAND  Final   Special Requests   Final    BOTTLES DRAWN AEROBIC AND ANAEROBIC Blood Culture adequate volume   Culture   Final    NO GROWTH < 24 HOURS Performed at [facility], [address] [place], [place] [address]    Report Status PENDING  Incomplete  CULTURE, BLOOD (ROUTINE X 2) w Reflex to ID Panel     Status: None (Preliminary result)   Collection Time: 01/29/18  9:43 AM  Result Value Ref Range Status   Specimen Description BLOOD LEFT HAND  Final   Special Requests   Final    BOTTLES DRAWN AEROBIC AND ANAEROBIC Blood Culture adequate volume   Culture   Final    NO GROWTH < 24 HOURS Performed at [facility], [address] [place], [place] [address]    Report Status PENDING  Incomplete     ABG    Component Value Date/Time   PHART 7.38 01/29/2018 0454   PCO2ART 29 (L) 01/29/2018 0454   PO2ART 90 01/29/2018 0454   HCO3 17.2 (L) 01/29/2018 0454   ACIDBASEDEF 6.9 (H) 01/29/2018 0454   O2SAT 96.8 01/29/2018 0454     Cardiac Enzymes Recent Labs    01/29/18 0432  CKTOTAL 175      Coagulation Lab Results  Component Value Date   INR 1.18 01/29/2018  Radiology Results Reviewed by me and compared with previous imaging studies Ct Abdomen Pelvis Wo Contrast  Result Date: 01/28/2018 CLINICAL DATA:  Inpatient. Elevated lipase. Severe nausea and vomiting. Abdominal pain. Prior hysterectomy. EXAM: CT ABDOMEN AND PELVIS WITHOUT CONTRAST TECHNIQUE: Multidetector CT imaging of the abdomen and pelvis was performed following the standard protocol without IV contrast. COMPARISON:  01/27/2018 abdominal radiograph. 09/19/2011 CT abdomen/pelvis. FINDINGS: Lower chest: Right middle lobe 7 mm solid pulmonary nodule (series 3/image 6), increased from 5 mm in 2013 CT. Coronary atherosclerosis. Hepatobiliary: Normal liver size. No liver mass. Cholecystectomy. No biliary ductal dilatation. CBD diameter 5 mm. Pancreas: Stable coarse uncinate process pancreatic  calcification, nonspecific, possibly vascular. No pancreatic mass or duct dilation. No significant peripancreatic fat stranding or fluid. Spleen: Normal size spleen. Bandlike low-attenuation focus in the lower spleen (series 2/image 20), new. Adrenals/Urinary Tract: Normal adrenals. Chronic severe asymmetric atrophy of the left kidney. Moderate bilateral hydroureteronephrosis to the level of the ureterovesical junction bilaterally. Hyperdense 1.1 cm anterior upper right renal cortical lesion, stable in size since 2013 although with new hyperdensity. Exophytic simple 1.1 cm posterior lower left renal cyst. No additional contour deforming renal lesions. No urolithiasis. Bladder is partially collapsed by indwelling Foley catheter with nondependent bladder gas compatible with instrumentation. No definite bladder wall thickening. Stomach/Bowel: Enteric tube terminates in the distal stomach. Collapsed and grossly normal stomach. Normal caliber small bowel with no small bowel wall thickening. Normal appendix. Normal large bowel with no diverticulosis, large bowel wall thickening or pericolonic fat stranding. Vascular/Lymphatic: Atherosclerotic nonaneurysmal abdominal aorta. No pathologically enlarged lymph nodes in the abdomen or pelvis. Reproductive: Status post hysterectomy, with no abnormal findings at the vaginal cuff. No adnexal mass. Other: No pneumoperitoneum, ascites or focal fluid collection. Moderate fat containing infraumbilical ventral abdominal hernia. Musculoskeletal: No aggressive appearing focal osseous lesions. Mild thoracolumbar spondylosis. Scattered subcentimeter sclerotic osseous lesions in sacrum and bilateral pelvic girdle are similar to prior CT, probably benign bone islands. IMPRESSION: 1. Moderate bilateral hydroureteronephrosis to the level of the UVJ bilaterally, despite the presence of an indwelling Foley catheter, which only partially collapses the bladder. No evidence of obstructing  urolithiasis or mass on this noncontrast scan. 2. No convincing noncontrast CT findings of acute pancreatitis. 3. Bandlike low-attenuation focus in the inferior spleen, poorly characterized on this noncontrast scan, new since 2013 CT. Cannot exclude splenic infarct or mass. Suggest attention on follow-up MRI or CT abdomen in 3-6 months. 4. Enteric tube terminates in the distal stomach. No evidence of bowel obstruction or acute bowel inflammation. 5. Right middle lobe 7 mm solid pulmonary nodule, mildly increased in size since 2013 CT. This nodule remains below PET resolution. Suggest follow-up chest CT in 6 months. 6. Moderate fat containing infraumbilical ventral abdominal hernia. 7.  Aortic Atherosclerosis (ICD10-I70.0). Electronically Signed   By: Ilona Sorrel M.D.   On: 01/28/2018 02:54   Dg Abd 1 View  Result Date: 01/27/2018 CLINICAL DATA:  NG tube placement EXAM: ABDOMEN - 1 VIEW COMPARISON:  01/27/2018 FINDINGS: NG tube coils in the stomach with the tip in the fundus. Nonobstructive bowel gas pattern. Prior cholecystectomy. IMPRESSION: NG tube coils in the stomach with the tip in the fundus. Electronically Signed   By: Rolm Baptise M.D.   On: 01/27/2018 19:14   Dg Abd 1 View  Result Date: 01/27/2018 CLINICAL DATA:  NG tube placement EXAM: ABDOMEN - 1 VIEW COMPARISON:  01/27/2018 FINDINGS: NG tube is in the stomach. Nonobstructive bowel gas pattern. Visualized lung bases  clear. Prior cholecystectomy. IMPRESSION: NG tube tip in the mid stomach. Electronically Signed   By: Rolm Baptise M.D.   On: 01/27/2018 18:50   US Renal  Result Date: 01/27/2018 CLINICAL DATA:  Acute renal failure EXAM: RENAL / URINARY TRACT ULTRASOUND COMPLETE COMPARISON:  CT abdomen/pelvis dated 09/19/2011 FINDINGS: Right Kidney: Length: 10.5 cm. Echogenic renal parenchyma. 16 x 14 x 10 mm lower pole cyst. No hydronephrosis. Left Kidney: Length: 8.6 cm. Cortical scarring with lobular parenchyma. Echogenic renal parenchyma.  Stable hydronephrosis versus extrarenal pelvis. Bladder: Within normal limits. IMPRESSION: Echogenic renal parenchyma, suggesting medical renal disease. Left renal cortical scarring with stable hydronephrosis versus extrarenal pelvis, chronic. This appearance is unchanged from 2013. 1.6 cm right lower pole renal cyst, simple. Electronically Signed   By: Julian Hy M.D.   On: 01/27/2018 12:41   Nm Pulmonary Perf And Vent  Result Date: 01/29/2018 CLINICAL DATA:  Suspected pulmonary embolus. Intermediate probability. Negative D-dimer. EXAM: NUCLEAR MEDICINE VENTILATION - PERFUSION LUNG SCAN TECHNIQUE: Ventilation images were obtained in multiple projections using inhaled aerosol Tc-2mDTPA. Perfusion images were obtained in multiple projections after intravenous injection of Tc-934mAA. RADIOPHARMACEUTICALS:  34.5 mCi of Tc-9931mPA aerosol inhalation and 4.3 mCi Tc99m67m IV COMPARISON:  Chest x-ray FINDINGS: Patchy ventilation perfusion matched defects with ventilation defects most more prominent than perfusion defects. This is a low probability for pulmonary embolus. IMPRESSION: Low probability scan for pulmonary embolus. Electronically Signed   By: ThomMarcello Mooresgister   On: 01/29/2018 13:26   Us VKoreaous Img Lower Bilateral  Result Date: 01/29/2018 CLINICAL DATA:  Bilateral lower extremity pain and edema for the past week. Evaluate for DVT. EXAM: BILATERAL LOWER EXTREMITY VENOUS DOPPLER ULTRASOUND TECHNIQUE: Gray-scale sonography with graded compression, as well as color Doppler and duplex ultrasound were performed to evaluate the lower extremity deep venous systems from the level of the common femoral vein and including the common femoral, femoral, profunda femoral, popliteal and calf veins including the posterior tibial, peroneal and gastrocnemius veins when visible. The superficial great saphenous vein was also interrogated. Spectral Doppler was utilized to evaluate flow at rest and with distal  augmentation maneuvers in the common femoral, femoral and popliteal veins. COMPARISON:  None. FINDINGS: RIGHT LOWER EXTREMITY Common Femoral Vein: No evidence of thrombus. Normal compressibility, respiratory phasicity and response to augmentation. Saphenofemoral Junction: No evidence of thrombus. Normal compressibility and flow on color Doppler imaging. Profunda Femoral Vein: No evidence of thrombus. Normal compressibility and flow on color Doppler imaging. Femoral Vein: No evidence of thrombus. Normal compressibility, respiratory phasicity and response to augmentation. Popliteal Vein: No evidence of thrombus. Normal compressibility, respiratory phasicity and response to augmentation. Calf Veins: No evidence of thrombus. Normal compressibility and flow on color Doppler imaging. Superficial Great Saphenous Vein: No evidence of thrombus. Normal compressibility. Venous Reflux:  None. Other Findings:  None. LEFT LOWER EXTREMITY Common Femoral Vein: No evidence of thrombus. Normal compressibility, respiratory phasicity and response to augmentation. Saphenofemoral Junction: No evidence of thrombus. Normal compressibility and flow on color Doppler imaging. Profunda Femoral Vein: No evidence of thrombus. Normal compressibility and flow on color Doppler imaging. Femoral Vein: No evidence of thrombus. Normal compressibility, respiratory phasicity and response to augmentation. Popliteal Vein: No evidence of thrombus. Normal compressibility, respiratory phasicity and response to augmentation. Calf Veins: No evidence of thrombus. Normal compressibility and flow on color Doppler imaging. Superficial Great Saphenous Vein: No evidence of thrombus. Normal compressibility. Venous Reflux:  None. Other Findings:  None. IMPRESSION: No evidence DVT within either  lower extremity. Electronically Signed   By: Sandi Mariscal M.D.   On: 01/29/2018 10:46   Dg Chest Port 1 View  Result Date: 01/29/2018 CLINICAL DATA:  Shortness of breath.  EXAM: PORTABLE CHEST 1 VIEW COMPARISON:  01/27/2018. FINDINGS: NG tube noted coiled stomach. Cardiomegaly scratched it stable cardiomegaly. No pulmonary venous congestion. Persistent worrisome pulmonary nodule left upper lobe. Again malignancy could present this fashion. Low lung volumes. Small left pleural effusion. No pneumothorax. IMPRESSION: 1.  NG tube noted coiled stomach. 2. Persistent left upper lobe nodule, again malignancy could present this fashion. Further evaluation suggested. 3.  Low lung volumes.  Small left pleural effusion. Electronically Signed   By: Marcello Moores  Register   On: 01/29/2018 10:00   Dg Chest Port 1 View  Result Date: 01/27/2018 CLINICAL DATA:  Fall this morning. Patient reports being diagnosed with a kidney infection last week. Vomiting. History of diabetes and hypertension. EXAM: PORTABLE CHEST 1 VIEW COMPARISON:  05/17/2006 radiographs.  CT neck 05/28/2017 FINDINGS: 0845 hours. The heart size is at the upper limits of normal for AP technique. There is aortic atherosclerosis. There is a left upper lobe lung nodule measuring approximately 14 mm (not corrected for magnification), unchanged from previous neck CT. The lungs are otherwise clear. There is no pleural effusion or pneumothorax. No acute osseous findings are evident. IMPRESSION: 1. No acute cardiopulmonary process. 2. Left upper lobe pulmonary nodule, which was enlarging on neck CT performed in January. This remains concerning for malignancy, and further evaluation recommended if not performed elsewhere. Electronically Signed   By: Richardean Sale M.D.   On: 01/27/2018 09:17   Dg Abd Portable 2 Views  Result Date: 01/27/2018 CLINICAL DATA:  Fall this morning, patient has been weak for the past couple weeks and states that she was diagnosed with kidney infection a week ago and that pt has been having dark blood like emesis for the past couple days. Possible sepsis. Hx of diabetes, HTN, abdominal hysterectomy. EXAM: PORTABLE  ABDOMEN - 2 VIEW COMPARISON:  CT, 09/19/2011 FINDINGS: Normal bowel gas pattern.  No free air. No evidence of renal or ureteral stones. Clips are upper quadrant reflect a prior cholecystectomy. There are scattered vascular calcifications. No acute skeletal abnormality.  Lung bases are clear. IMPRESSION: 1. No acute findings.  No evidence bowel obstruction or free air. Electronically Signed   By: Lajean Manes M.D.   On: 01/27/2018 09:14   [place] Ekg Site Rite  Result Date: 01/28/2018 If Site Rite image not attached, placement could not be confirmed due to current cardiac rhythm.     ECHO      The patient is: _0  acutely ill Risk of deterioration: _1  moderate   _2  critically ill  _3  high   _4 See my orders for details  My assessment, plan of care, findings, medications, side effects etc were discussed with: _5 nursing _6 PT/OT   _7 respiratory therapy _8 Dr.  Burnard Leigh family _10    Tiffany Manning, M.D. Pulmonary Critical Care & Sleep Medicine  _11 @ 01/30/2018

## 2018-01-31 ENCOUNTER — Other Ambulatory Visit: Payer: Medicare HMO

## 2018-01-31 LAB — GLUCOSE, CAPILLARY
GLUCOSE-CAPILLARY: 122 mg/dL — AB (ref 70–99)
GLUCOSE-CAPILLARY: 85 mg/dL (ref 70–99)
GLUCOSE-CAPILLARY: 92 mg/dL (ref 70–99)
Glucose-Capillary: 80 mg/dL (ref 70–99)
Glucose-Capillary: 71 mg/dL (ref 70–99)
Glucose-Capillary: 112 mg/dL — ABNORMAL HIGH (ref 70–99)

## 2018-01-31 LAB — BASIC METABOLIC PANEL
ANION GAP: 8 (ref 5–15)
BUN: 38 mg/dL — AB (ref 8–23)
CALCIUM: 7.6 mg/dL — AB (ref 8.9–10.3)
CO2: 20 mmol/L — ABNORMAL LOW (ref 22–32)
Chloride: 118 mmol/L — ABNORMAL HIGH (ref 98–111)
Creatinine, Ser: 1.78 mg/dL — ABNORMAL HIGH (ref 0.44–1.00)
GFR calc Af Amer: 30 mL/min — ABNORMAL LOW (ref >60)
GFR, EST NON AFRICAN AMERICAN: 26 mL/min — AB (ref >60)
GLUCOSE: 84 mg/dL (ref 70–99)
POTASSIUM: 3.4 mmol/L — AB (ref 3.5–5.1)
SODIUM: 146 mmol/L — AB (ref 135–145)

## 2018-01-31 LAB — CBC
HCT: 24 % — ABNORMAL LOW (ref 35.0–47.0)
HEMOGLOBIN: 7.9 g/dL — AB (ref 12.0–16.0)
MCH: 31.2 pg (ref 26.0–34.0)
MCHC: 32.9 g/dL (ref 32.0–36.0)
MCV: 95.1 fL (ref 80.0–100.0)
Platelets: 193 10*3/uL (ref 150–440)
RBC: 2.52 MIL/uL — AB (ref 3.80–5.20)
RDW: 15.7 % — ABNORMAL HIGH (ref 11.5–14.5)
WBC: 9.1 10*3/uL (ref 3.6–11.0)

## 2018-01-31 LAB — URINE CULTURE: Culture: 10000 — AB

## 2018-01-31 LAB — HEPARIN LEVEL (UNFRACTIONATED): HEPARIN UNFRACTIONATED: 0.14 [IU]/mL — AB (ref 0.30–0.70)

## 2018-01-31 MED ORDER — SODIUM CHLORIDE 0.9% FLUSH
3.0000 mL | Freq: Two times a day (BID) | INTRAVENOUS | Status: DC
  Administered 2018-01-31 – 2018-02-02 (×3): 3 mL via INTRAVENOUS

## 2018-01-31 MED ORDER — METOPROLOL TARTRATE 50 MG PO TABS
75.0000 mg | ORAL_TABLET | Freq: Two times a day (BID) | ORAL | Status: DC
  Administered 2018-01-31 – 2018-02-02 (×4): 75 mg via ORAL
  Filled 2018-01-31 (×4): qty 1

## 2018-01-31 MED ORDER — SIMVASTATIN 20 MG PO TABS
40.0000 mg | ORAL_TABLET | Freq: Every day | ORAL | Status: DC
  Administered 2018-01-31 – 2018-02-01 (×2): 40 mg via ORAL
  Filled 2018-01-31 (×2): qty 2

## 2018-01-31 MED ORDER — APIXABAN 2.5 MG PO TABS
2.5000 mg | ORAL_TABLET | Freq: Two times a day (BID) | ORAL | Status: DC
  Administered 2018-01-31 – 2018-02-04 (×10): 2.5 mg via ORAL
  Filled 2018-01-31 (×10): qty 1

## 2018-01-31 MED ORDER — HEPARIN BOLUS VIA INFUSION
1800.0000 [IU] | Freq: Once | INTRAVENOUS | Status: AC
  Administered 2018-01-31: 1800 [IU] via INTRAVENOUS
  Filled 2018-01-31: qty 1800

## 2018-01-31 MED ORDER — TRAMADOL HCL 50 MG PO TABS
50.0000 mg | ORAL_TABLET | Freq: Four times a day (QID) | ORAL | Status: DC | PRN
  Administered 2018-01-31 – 2018-02-05 (×7): 50 mg via ORAL
  Filled 2018-01-31 (×7): qty 1

## 2018-01-31 NOTE — Evaluation (Signed)
Occupational Therapy Evaluation Patient Details Name: Tiffany Manning MRN: [id number] DOB: March 05, 1941 Today's Date: 01/31/2018    History of Present Illness 77yo female pt with PMHx significant for HTN, DM, CKD3, new onset Afib with RVR, and new bladder lesions. Pt admitted for DKA, severe sepsis, ARF and UTI and transferred to ICU. Had Gtube which has since been removed.    Clinical Impression   Pt seen for OT evaluation this date. Prior to hospital admission, pt was modified indep with a SPC for mobility, inde with ADL tasks and denies falls history.  Pt lives with her spouse and 2 dogs.  Currently pt demonstrates impairments in strength, activity tolerance, and headache pain. Pt requires max assist for LB ADL tasks and demonstrates difficulty with dizziness upon sitting preventing her from attempting further functional mobility. Pt would benefit from skilled OT to address noted impairments and functional limitations (see below for any additional details) in order to maximize safety and independence while minimizing falls risk and caregiver burden.  Upon hospital discharge, recommend pt discharge to STR pending progress with therapy.    Follow Up Recommendations  SNF    Equipment Recommendations  Other (comment)(TBD)    Recommendations for Other Services       Precautions / Restrictions Precautions Precautions: Fall;Other (comment) Precaution Comments: per cardiology, want resting heart rate <110 Restrictions Weight Bearing Restrictions: No      Mobility Bed Mobility Overal bed mobility: Needs Assistance Bed Mobility: Supine to Sit;Sit to Supine     Supine to sit: Min guard;HOB elevated Sit to supine: Min guard      Transfers Overall transfer level: Needs assistance   Transfers: Lateral/Scoot Transfers          Lateral/Scoot Transfers: Min assist General transfer comment: increased time/effort/difficulty to perform limited lateral scoot in bed    Balance Overall  balance assessment: Needs assistance Sitting-balance support: Bilateral upper extremity supported;Feet unsupported Sitting balance-Leahy Scale: Fair                                     ADL either performed or assessed with clinical judgement   ADL Overall ADL's : Needs assistance/impaired Eating/Feeding: Sitting;Supervision/ safety   Grooming: Sitting;Supervision/safety   Upper Body Bathing: Sitting;Minimal assistance   Lower Body Bathing: Sitting/lateral leans;Moderate assistance   Upper Body Dressing : Sitting;Minimal assistance   Lower Body Dressing: Sitting/lateral leans;Maximal assistance Lower Body Dressing Details (indicate cue type and reason): pt attempted to don socks seated EOB but reports unable after attempting, requiring max assist                     Vision Baseline Vision/History: Wears glasses Wears Glasses: Reading only Patient Visual Report: No change from baseline       Perception     Praxis      Pertinent Vitals/Pain Pain Assessment: 0-10 Pain Location: pt became frustrated when asked to give headache a number, stating it was moderate to severe but improving slightly since receiving pain meds Pain Descriptors / Indicators: Aching;Headache Pain Intervention(s): Limited activity within patient's tolerance;Monitored during session;Premedicated before session;Repositioned     Hand Dominance     Extremity/Trunk Assessment Upper Extremity Assessment Upper Extremity Assessment: Generalized weakness   Lower Extremity Assessment Lower Extremity Assessment: Generalized weakness       Communication Communication Communication: No difficulties   Cognition Arousal/Alertness: Awake/alert Behavior During Therapy: WFL for tasks assessed/performed Overall Cognitive  Status: Within Functional Limits for tasks assessed                                     General Comments       Exercises Other Exercises Other  Exercises: pt instructed in bed mobility training   Shoulder Instructions      Home Living Family/patient expects to be discharged to:: Private residence Living Arrangements: Spouse/significant other Available Help at Discharge: Family Type of Home: House Home Access: Stairs to enter Secretary/administrator of Steps: 3 Entrance Stairs-Rails: Right;Left Home Layout: One level     Bathroom Shower/Tub: Walk-in shower;Tub/shower unit   Bathroom Toilet: Standard     Home Equipment: Cane - single point          Prior Functioning/Environment Level of Independence: Independent with assistive device(s)        Comments: Pt ambulating with SPC, indep wiht ADL, IADL, does not drive, and no falls within past 12  months        OT Problem List: Decreased strength;Decreased knowledge of use of DME or AE;Decreased activity tolerance;Pain;Impaired balance (sitting and/or standing);Cardiopulmonary status limiting activity      OT Treatment/Interventions: Self-care/ADL training;Balance training;Therapeutic exercise;Therapeutic activities;DME and/or AE instruction;Patient/family education    OT Goals(Current goals can be found in the care plan section) Acute Rehab OT Goals Patient Stated Goal: pt wants to go home OT Goal Formulation: With patient/family Time For Goal Achievement: 02/14/18 Potential to Achieve Goals: Good ADL Goals Pt Will Perform Lower Body Dressing: with min assist;sit to/from stand;with caregiver independent in assisting;with adaptive equipment Pt Will Transfer to Toilet: with min assist;ambulating(LRAD for amb, comfort height toilet) Additional ADL Goal #1: Pt will verbalize plan to implement at least 1 learned energy conservation strategy to maximize safety and independence with return to daily routines.  OT Frequency: Min 1X/week   Barriers to D/C:            Co-evaluation              AM-PAC PT "6 Clicks" Daily Activity     Outcome Measure Help from  another person eating meals?: None Help from another person taking care of personal grooming?: None Help from another person toileting, which includes using toliet, bedpan, or urinal?: A Lot Help from another person bathing (including washing, rinsing, drying)?: A Lot Help from another person to put on and taking off regular upper body clothing?: A Little Help from another person to put on and taking off regular lower body clothing?: A Lot 6 Click Score: 17   End of Session    Activity Tolerance: Other (comment)(dizziness) Patient left: in bed;with call bell/phone within reach;with bed alarm set;with family/visitor present  OT Visit Diagnosis: Other abnormalities of gait and mobility (R26.89);Muscle weakness (generalized) (M62.81);Dizziness and giddiness (R42)                Time: 1610-9604 OT Time Calculation (min): 21 min Charges:  OT General Charges $OT Visit: 1 Visit OT Evaluation $OT Eval Low Complexity: 1 Low OT Treatments $Self Care/Home Management : 8-22 mins  Richrd Prime, MPH, MS, OTR/L ascom [phone] 01/31/18, 11:53 AM

## 2018-01-31 NOTE — Progress Notes (Signed)
Inpatient Diabetes Program Recommendations  AACE/ADA: New Consensus Statement on Inpatient Glycemic Control (2019)  Target Ranges:  Prepandial:   less than 140 mg/dL      Peak postprandial:   less than 180 mg/dL (1-2 hours)      Critically ill patients:  140 - 180 mg/dL  Results for Tiffany Manning, Tiffany Manning (MRN [id number]) as of 01/31/2018 08:29  Ref. Range 01/30/2018 07:22 01/30/2018 11:30 01/30/2018 16:17 01/30/2018 20:13 01/31/2018 00:51 01/31/2018 04:25 01/31/2018 07:43  Glucose-Capillary Latest Ref Range: 70 - 99 mg/dL 782 (H) 956 (H) 213 (H) 118 (H) 122 (H) 80 71   Results for [name], [name] (MRN [id number]) as of 01/31/2018 08:29  Ref. Range 01/30/2018 02:18  Hemoglobin A1C Latest Ref Range: 4.8 - 5.6 % 7.8 (H)   Review of Glycemic Control  Diabetes history: DM2 Outpatient Diabetes medications: Metaglip 5-500 mg once a day every other day Current orders for Inpatient glycemic control: Lantus 14 units QHS, Novolog 0-9 units Q4H  Inpatient Diabetes Program Recommendations:  Insulin - Basal: Please discontinue Lantus. Insulin-Correction: Please change CBGs and Novolog to 0-9 units TID with meals and Novolog 0-5 units QHS. Oral Agents: Please consider ordering Glipizide 2.5 mg QPM. Would not start until this evening since patient received Lantus 14 units last night at bedtime. Would also recommend patient be discharged on Glipizide 2.5 mg daily and follow up with PCP. HgbA1C:  A1C 7.5% on 01/30/18 indicating an average glucose of 169 mg/dl.   Thanks, Orlando Penner, RN, MSN, CDE Diabetes Coordinator Inpatient Diabetes Program [phone] (Team Pager from 8am to 5pm)

## 2018-01-31 NOTE — Progress Notes (Signed)
PT Cancellation Note  Patient Details Name: Tiffany Manning MRN: [id number] DOB: [birth year]   Cancelled Treatment:     Pt refused PT tx, stating that after OT visited in the morning and sitting on the EOB she has felt sick to her stomach, states "I do not want to throw up." PT tried 3 times to convince the pt that we would take everything slow, do a few exercises in bed and work up to sitting at the EOB, pt continues to refuse despite encouragement. Will attempt at later date when pt is agreeable.  Arvilla Meres, SPT   Arvilla Meres 01/31/2018, 3:35 PM

## 2018-01-31 NOTE — Progress Notes (Signed)
ANTICOAGULATION CONSULT NOTE  Pharmacy Consult for heparin Indication: atrial fibrillation  Allergies  Allergen Reactions  . Sulfa Antibiotics Nausea And Vomiting    Patient Measurements: Height: 5' (152.4 cm) Weight: 126 lb 8.7 oz (57.4 kg) IBW/kg (Calculated) : 45.5 Heparin Dosing Weight: 57 kg  Vital Signs: Temp: 98.1 F (36.7 C) (09/26 0424) Temp Source: Oral (09/26 0424) BP: 137/80 (09/26 0424) Pulse Rate: 99 (09/26 0424)  Labs: Recent Labs    01/29/18 0432 01/29/18 1845 01/30/18 0218 01/31/18 0454  HGB 8.8*  --  8.2* 7.9*  HCT 27.1*  --  25.4* 24.0*  PLT 271  --  215 193  LABPROT 14.9  --   --   --   INR 1.18  --   --   --   HEPARINUNFRC  --  0.65 0.43 0.14*  CREATININE 2.31*  --  1.96* 1.78*  CKTOTAL 175  --   --   --     Estimated Creatinine Clearance: 20.7 mL/min (A) (by C-G formula based on SCr of 1.78 mg/dL (H)).   Medical History: Past Medical History:  Diagnosis Date  . Diabetes mellitus without complication (HCC)   . Hyperlipidemia   . Hypertension     Assessment: 77 year old female presented with severe sepsis. Patient with atrial fibrillation with RVR, unknown date of onset. Pharmacy consulted to dose heparin. Last dose of subQ heparin 9/24 at 0543.  Goal of Therapy:  Heparin level 0.3-0.7 units/ml Monitor platelets by anticoagulation protocol: Yes   Plan:  9/26 @ 0500 HL 0.14 subtherapeutic. Will rebolus w/ heparin 1800 units IV x 1 and increase rate to 900 units/hr and will recheck HL @ 1400, CBC steadily trending down will continue to monitor.  Thomasene Ripple, PharmD, BCPS Clinical Pharmacist 01/31/2018 6:39 AM

## 2018-01-31 NOTE — Consult Note (Signed)
ANTICOAGULATION CONSULT NOTE - Initial Consult  Pharmacy Consult for apixaban Indication: atrial fibrillation  Allergies  Allergen Reactions  . Sulfa Antibiotics Nausea And Vomiting    Patient Measurements: Height: 5' (152.4 cm) Weight: 126 lb 8.7 oz (57.4 kg) IBW/kg (Calculated) : 45.5 Heparin Dosing Weight:   Vital Signs: Temp: 98.2 F (36.8 C) (09/26 0740) Temp Source: Oral (09/26 0740) BP: 155/84 (09/26 0740) Pulse Rate: 89 (09/26 0740)  Labs: Recent Labs    01/29/18 0432 01/29/18 1845 01/30/18 0218 01/31/18 0454  HGB 8.8*  --  8.2* 7.9*  HCT 27.1*  --  25.4* 24.0*  PLT 271  --  215 193  LABPROT 14.9  --   --   --   INR 1.18  --   --   --   HEPARINUNFRC  --  0.65 0.43 0.14*  CREATININE 2.31*  --  1.96* 1.78*  CKTOTAL 175  --   --   --     Estimated Creatinine Clearance: 20.7 mL/min (A) (by C-G formula based on SCr of 1.78 mg/dL (H)).   Medical History: Past Medical History:  Diagnosis Date  . Diabetes mellitus without complication (HCC)   . Hyperlipidemia   . Hypertension     Medications:  Scheduled:  . apixaban  2.5 mg Oral BID  . chlorhexidine  15 mL Mouth Rinse BID  . insulin aspart  0-9 Units Subcutaneous Q4H  . insulin glargine  14 Units Subcutaneous QHS  . mouth rinse  15 mL Mouth Rinse q12n4p  . metoprolol tartrate  50 mg Oral BID    Assessment: Patient is a 77 year old female with afib-currently on heparin drip. Pharmacy consulted to start apixaban.   Goal of Therapy:   Monitor platelets by anticoagulation protocol: Yes   Plan:  apixaban 2.5mg  BID due to scr >1.5 and TBW <60kg. Will give first dose at same time turn heparin drip off.  Olene Floss, Pharm.D, BCPS Clinical Pharmacist 01/31/2018,9:52 AM

## 2018-01-31 NOTE — Care Management Important Message (Signed)
Copy of signed IM left with patient in room.  

## 2018-01-31 NOTE — Progress Notes (Signed)
[facility] - [place] [facility]   PATIENT NAME: Tiffany Manning    MR#:  [id number]  DATE OF BIRTH:  01-12-1941  SUBJECTIVE:   Feeling a lot better today, renal function is improving.  She feels like she has more energy she wants to eat.  REVIEW OF SYSTEMS:    Review of Systems  Constitutional: Negative for chills and fever.  HENT: Negative for congestion and tinnitus.   Eyes: Negative for blurred vision and double vision.  Respiratory: Negative for cough, shortness of breath and wheezing.   Cardiovascular: Negative for chest pain, orthopnea and PND.  Gastrointestinal: Negative for abdominal pain, diarrhea, nausea and vomiting.  Genitourinary: Negative for dysuria and hematuria.  Neurological: Positive for weakness (Generalized). Negative for dizziness, sensory change and focal weakness.  All other systems reviewed and are negative.   Nutrition: Heart healthy/Carb control Tolerating Diet: Yes Tolerating PT: Eval. Noted.   DRUG ALLERGIES:   Allergies  Allergen Reactions  . Sulfa Antibiotics Nausea And Vomiting    VITALS:  Blood pressure (!) 155/84, pulse 89, temperature 98.2 F (36.8 C), temperature source Oral, resp. rate 20, height 5' (1.524 m), weight 57.4 kg, SpO2 100 %.  PHYSICAL EXAMINATION:   Physical Exam  GENERAL:  77 y.o.-year-old patient lying in bed in NAD.     EYES: Pupils equal, round, reactive to light and accommodation. No scleral icterus. Extraocular muscles intact.  HEENT: Head atraumatic, normocephalic. Oropharynx and nasopharynx clear. Moist Oral Mucosa.   NECK:  Supple, no jugular venous distention. No thyroid enlargement, no tenderness.  LUNGS: Normal breath sounds bilaterally, no wheezing, rales, rhonchi. No use of accessory muscles of respiration.  CARDIOVASCULAR: S1, S2 Irregular. No murmurs, rubs, or gallops.  ABDOMEN: Soft, nontender, nondistended. Bowel sounds present. No organomegaly or mass.  EXTREMITIES: No cyanosis,  clubbing or edema b/l.    NEUROLOGIC: Cranial nerves II through XII are intact. No focal Motor or sensory deficits b/l. PSYCHIATRIC: The patient is alert and oriented x 3.  SKIN: No obvious rash, lesion, or ulcer.   Foley in place with yellow urine draining.    LABORATORY PANEL:   CBC Recent Labs  Lab 01/31/18 0454  WBC 9.1  HGB 7.9*  HCT 24.0*  PLT 193   ------------------------------------------------------------------------------------------------------------------  Chemistries  Recent Labs  Lab 01/29/18 0432 01/30/18 0218 01/31/18 0454  NA 146* 147* 146*  K 4.7 3.9 3.4*  CL 117* 120* 118*  CO2 17* 20* 20*  GLUCOSE 256* 135* 84  BUN 59* 48* 38*  CREATININE 2.31* 1.96* 1.78*  CALCIUM 7.6* 7.8* 7.6*  MG 1.6* 2.0  --   AST 19  --   --   ALT 14  --   --   ALKPHOS 67  --   --   BILITOT 0.7  --   --    ------------------------------------------------------------------------------------------------------------------  Cardiac Enzymes No results for input(s): TROPONINI in the last 168 hours. ------------------------------------------------------------------------------------------------------------------  RADIOLOGY:  No results found.   ASSESSMENT AND PLAN:   77 year old female with past medical history of diabetes, hypertension, hyperlipidemia, previous history of a neck abscess who presents to the hospital due to weakness, lethargy and malaise and noted to have severe sepsis secondary to UTI and also noted to be in acute diabetic ketoacidosis.  1.  Acute diabetic ketoacidosis- secondary to underlying sepsis.  - much improved and AG closed. Off insulin gtt and electrolytes stable. -Blood sugars stable, continue Lantus, sliding scale insulin for now.  2.  Sepsis-patient has severe  sepsis secondary to UTI. -Urine cultures are growing 100,000 colonies of E. coli.  Pansensitive and cont. IV Ceftriaxone. -  Blood cultures + for Staph Hominis and repeat cultures done  growing staph capitis which is likely a skin contaminant and not likely significant.  DC IV vancomycin.  3.  Urinary tract infection-source of patient's sepsis. Urine culture + E. Coli and it's pansensitive.   -Patient had a renal ultrasound done which showed some scarring and a CT scan of the abdomen pelvis suggestive of bilateral hydronephrosis.  -Seen by urology in the setting of severe sepsis and possible pyenephrosis of the left kidney patient is status post bilateral ureteral stent placement now.  Much improved.  Discussed with urology and would leave Foley in until follow-up with them as outpatient.  4.  Acute kidney injury secondary to severe sepsis and underlying UTI combined with concomitant use of diuretics. - Seen by nephrology, like in the setting of multifactorial issues.  Patient is not oliguric.  No acute indication for dialysis as per nephrology.  Continue treatment of underlying sepsis and UTI as mentioned above. - Cr. Trending down.  Renal dose meds and avoid nephrotoxins.  5.  Atrial fibrillation with RVR- continue oral metoprolol but dose advanced.  -DC heparin drip, started on Eliquis.  Appreciate cardiology input.  Echocardiogram showing normal EF.  6.  Leukocytosis- secondary to sepsis/UTI. - resolved.   7.  Essential hypertension- cont. Metoprolol.     8. Anemia - likely anemia of chronic disease and will cont. To follow hg.  - no need for transfusion.   9. Hyperlipidemia - cont. Simvastatin.   PT eval noted.   All the records are reviewed and case discussed with Care Management/Social Worker. Management plans discussed with the patient, family and they are in agreement.  CODE STATUS: Full code  DVT Prophylaxis: Hep SQ  TOTAL TIME TAKING CARE OF THIS PATIENT: 30 minutes.   POSSIBLE D/C IN 1-2 DAYS, DEPENDING ON CLINICAL CONDITION.   Houston Siren M.D on 01/31/2018 at 3:49 PM  Between 7am to 6pm - Pager - [phone]  After 6pm go to [email or web address]  - Social research officer, government  [facility] [place] [facility]  Office  [phone]  CC: Primary care physician; Jaclyn Shaggy, MD

## 2018-01-31 NOTE — Progress Notes (Signed)
[facility] Tiffany Manning, [place] 01/31/18  Subjective:   Transferred out of ICU. Trying to eat soup/clears B/l ureteral stents placed this admission. Left pyonephrosis suspected Serum creatinine slightly improved down to 1.78.  Urine output 1050 cc  Objective:  Vital signs in last 24 hours:  Temp:  [97.6 F (36.4 C)-98.7 F (37.1 C)] 98.2 F (36.8 C) (09/26 0740) Pulse Rate:  [89-117] 89 (09/26 0740) Resp:  [18-20] 20 (09/26 0740) BP: (137-156)/(80-106) 155/84 (09/26 0740) SpO2:  [98 %-100 %] 100 % (09/26 0740)  Weight change:  Filed Weights   01/27/18 0840 01/27/18 1122  Weight: 54.4 kg 57.4 kg    Intake/Output:    Intake/Output Summary (Last 24 hours) at 01/31/2018 1011 Last data filed at 01/31/2018 0515 Gross per 24 hour  Intake 764 ml  Output 1050 ml  Net -286 ml     Physical Exam: General: Ill appearing  HEENT moist oral mucus membranes  Neck supple  Pulm/lungs Decreased breath sounds at bases  CVS/Heart Tachycardic, irregular  Abdomen:  Soft, NT  Extremities: trace edema  Neurologic: Alert, follows few simple commands  Skin: warm   Foley in place       Basic Metabolic Panel:  Recent Labs  Lab 01/27/18 1905  01/28/18 0839 01/28/18 1313 01/28/18 1627 01/28/18 2050 01/29/18 0432 01/30/18 0218 01/31/18 0454  NA 140   < > 148* 143 146* 144 146* 147* 146*  K 3.2*   < > 3.2* 4.3 3.9 3.9 4.7 3.9 3.4*  CL 117*   < > 114* 115* 114* 116* 117* 120* 118*  CO2 <7*   < > 20* 15* 19* 18* 17* 20* 20*  GLUCOSE 203*   < > 159* 317* 224* 127* 256* 135* 84  BUN 83*   < > 71* 72* 68* 67* 59* 48* 38*  CREATININE 2.60*   < > 2.44* 2.62* 2.46* 2.42* 2.31* 1.96* 1.78*  CALCIUM 8.0*   < > 8.0* 7.6* 7.8* 7.4* 7.6* 7.8* 7.6*  MG 2.1  --  2.0 1.8  --   --  1.6* 2.0  --   PHOS 4.7*  --  1.6*  --   --   --  2.2* 2.4*  --    < > = values in this interval not displayed.     CBC: Recent Labs  Lab 01/27/18 0845 01/27/18 1905 01/28/18 0257  01/29/18 0432 01/30/18 0218 01/31/18 0454  WBC 22.3*  --  14.1* 12.0* 11.8* 9.1  NEUTROABS 19.5*  --   --  9.7*  --   --   HGB 11.7* 10.2* 9.8* 8.8* 8.2* 7.9*  HCT 37.2 32.0* 30.2* 27.1* 25.4* 24.0*  MCV 97.1  --  92.5 93.3 93.7 95.1  PLT 491*  --  327 271 215 193     No results found for: HEPBSAG, HEPBSAB, HEPBIGM    Microbiology:  Recent Results (from the past 240 hour(s))  Blood Culture (routine x 2)     Status: Abnormal   Collection Time: 01/27/18  8:40 AM  Result Value Ref Range Status   Specimen Description   Final    BLOOD RIGHT ANTECUBITAL Performed at [facility], [address] [place], [place] [address]    Special Requests   Final    BOTTLES DRAWN AEROBIC AND ANAEROBIC Blood Culture adequate volume Performed at [facility], [address] [place], [place] [address]    Culture  Setup Time   Final    AEROBIC BOTTLE ONLY  GRAM POSITIVE COCCI CRITICAL RESULT CALLED TO, READ BACK BY AND VERIFIED WITH: MATT [facility] 01/28/18 0618 REC Performed at [facility], [address], [place], [place] [address]    Culture STAPHYLOCOCCUS HOMINIS (A)  Final   Report Status 01/30/2018 FINAL  Final   Organism ID, Bacteria STAPHYLOCOCCUS HOMINIS  Final      Susceptibility   Staphylococcus hominis - MIC*    CIPROFLOXACIN >=8 RESISTANT Resistant     ERYTHROMYCIN <=0.25 SENSITIVE Sensitive     GENTAMICIN <=0.5 SENSITIVE Sensitive     OXACILLIN >=4 RESISTANT Resistant     TETRACYCLINE >=16 RESISTANT Resistant     VANCOMYCIN <=0.5 SENSITIVE Sensitive     TRIMETH/SULFA >=320 RESISTANT Resistant     CLINDAMYCIN <=0.25 SENSITIVE Sensitive     RIFAMPIN <=0.5 SENSITIVE Sensitive     Inducible Clindamycin NEGATIVE Sensitive     * STAPHYLOCOCCUS HOMINIS  Blood Culture ID Panel (Reflexed)     Status: Abnormal   Collection Time: 01/27/18  8:40 AM  Result Value Ref Range Status   Enterococcus species NOT DETECTED NOT DETECTED Final   Listeria monocytogenes  NOT DETECTED NOT DETECTED Final   Staphylococcus species DETECTED (A) NOT DETECTED Final    Comment: Methicillin (oxacillin) resistant coagulase negative staphylococcus. Possible blood culture contaminant (unless isolated from more than one blood culture draw or clinical case suggests pathogenicity). No antibiotic treatment is indicated for blood  culture contaminants. CRITICAL RESULT CALLED TO, READ BACK BY AND VERIFIED WITH: MATT MCBANE 01/28/18 0618 REC    Staphylococcus aureus NOT DETECTED NOT DETECTED Final   Methicillin resistance DETECTED (A) NOT DETECTED Final    Comment: CRITICAL RESULT CALLED TO, READ BACK BY AND VERIFIED WITH: MATT MCBANE 01/28/18 0618 REC    Streptococcus species NOT DETECTED NOT DETECTED Final   Streptococcus agalactiae NOT DETECTED NOT DETECTED Final   Streptococcus pneumoniae NOT DETECTED NOT DETECTED Final   Streptococcus pyogenes NOT DETECTED NOT DETECTED Final   Acinetobacter baumannii NOT DETECTED NOT DETECTED Final   Enterobacteriaceae species NOT DETECTED NOT DETECTED Final   Enterobacter cloacae complex NOT DETECTED NOT DETECTED Final   Escherichia coli NOT DETECTED NOT DETECTED Final   Klebsiella oxytoca NOT DETECTED NOT DETECTED Final   Klebsiella pneumoniae NOT DETECTED NOT DETECTED Final   Proteus species NOT DETECTED NOT DETECTED Final   Serratia marcescens NOT DETECTED NOT DETECTED Final   Haemophilus influenzae NOT DETECTED NOT DETECTED Final   Neisseria meningitidis NOT DETECTED NOT DETECTED Final   Pseudomonas aeruginosa NOT DETECTED NOT DETECTED Final   Candida albicans NOT DETECTED NOT DETECTED Final   Candida glabrata NOT DETECTED NOT DETECTED Final   Candida krusei NOT DETECTED NOT DETECTED Final   Candida parapsilosis NOT DETECTED NOT DETECTED Final   Candida tropicalis NOT DETECTED NOT DETECTED Final    Comment: Performed at [facility], [address] [place], [place] [address]  Blood Culture (routine x 2)     Status:  Abnormal   Collection Time: 01/27/18  8:46 AM  Result Value Ref Range Status   Specimen Description   Final    BLOOD LEFT ANTECUBITAL Performed at [facility], [address] [place], [place] [address]    Special Requests   Final    BOTTLES DRAWN AEROBIC AND ANAEROBIC Blood Culture adequate volume Performed at [facility], [address], [place], [place] [address]    Culture  Setup Time   Final    GRAM POSITIVE COCCI AEROBIC BOTTLE  ONLY CRITICAL VALUE NOTED.  VALUE IS CONSISTENT WITH PREVIOUSLY REPORTED AND CALLED VALUE. Performed at [facility], [address] [place], [place] [address]    Culture (A)  Final    STAPHYLOCOCCUS CAPITIS THE SIGNIFICANCE OF ISOLATING THIS ORGANISM FROM A SINGLE SET OF BLOOD CULTURES WHEN MULTIPLE SETS ARE DRAWN IS UNCERTAIN. PLEASE NOTIFY THE MICROBIOLOGY DEPARTMENT WITHIN ONE WEEK IF SPECIATION AND SENSITIVITIES ARE REQUIRED. Performed at [facility], [address], [place], [place] [address]    Report Status 01/30/2018 FINAL  Final  Urine Culture     Status: Abnormal   Collection Time: 01/27/18  9:45 AM  Result Value Ref Range Status   Specimen Description   Final    URINE, CATHETERIZED Performed at [facility], [address] [place], [place] [address]    Special Requests   Final    NONE Performed at [facility], [address] [place], [place] [address]    Culture >=100,000 COLONIES/mL ESCHERICHIA COLI (A)  Final   Report Status 01/30/2018 FINAL  Final   Organism ID, Bacteria ESCHERICHIA COLI (A)  Final      Susceptibility   Escherichia coli - MIC*    AMPICILLIN 16 INTERMEDIATE Intermediate     CEFAZOLIN <=4 SENSITIVE Sensitive     CEFTRIAXONE <=1 SENSITIVE Sensitive     CIPROFLOXACIN <=0.25 SENSITIVE Sensitive     GENTAMICIN <=1 SENSITIVE Sensitive     IMIPENEM <=0.25 SENSITIVE Sensitive     NITROFURANTOIN 32 SENSITIVE Sensitive     TRIMETH/SULFA <=20 SENSITIVE  Sensitive     AMPICILLIN/SULBACTAM 8 SENSITIVE Sensitive     PIP/TAZO <=4 SENSITIVE Sensitive     Extended ESBL NEGATIVE Sensitive     * >=100,000 COLONIES/mL ESCHERICHIA COLI  MRSA PCR Screening     Status: None   Collection Time: 01/27/18 11:25 AM  Result Value Ref Range Status   MRSA by PCR NEGATIVE NEGATIVE Final    Comment:        The GeneXpert MRSA Assay (FDA approved for NASAL specimens only), is one component of a comprehensive MRSA colonization surveillance program. It is not intended to diagnose MRSA infection nor to guide or monitor treatment for MRSA infections. Performed at [facility], [address] [place], [place] [address]   C difficile quick scan w PCR reflex     Status: None   Collection Time: 01/28/18  1:14 PM  Result Value Ref Range Status   C Diff antigen NEGATIVE NEGATIVE Final   C Diff toxin NEGATIVE NEGATIVE Final   C Diff interpretation No C. difficile detected.  Final    Comment: Performed at [facility], [address] [place], [place] [address]  Urine Culture     Status: None   Collection Time: 01/28/18  6:31 PM  Result Value Ref Range Status   Specimen Description   Final    URINE, RANDOM Performed at [facility], [address] [place], [place] [address]    Special Requests RIGHT RENAL PELVIS  Final   Culture   Final    NO GROWTH Performed at [facility], [address], [place], [place] [address]    Report Status 01/30/2018 FINAL  Final  Urine Culture     Status: Abnormal   Collection Time: 01/28/18  6:33 PM  Result Value Ref Range Status   Specimen Description   Final    URINE, RANDOM Performed at [facility], [address]  [place], [place] [address]    Special Requests   Final    LEFT KIDNEY Performed at [facility], [address], [place], [place] [address]    Culture 10,000 COLONIES/mL ESCHERICHIA COLI (A)  Final   Report Status 01/31/2018 FINAL  Final   Organism  ID, Bacteria ESCHERICHIA COLI (A)  Final      Susceptibility   Escherichia coli - MIC*    AMPICILLIN <=2 SENSITIVE Sensitive     CEFAZOLIN <=4 SENSITIVE Sensitive     CEFTRIAXONE <=1 SENSITIVE Sensitive     CIPROFLOXACIN <=0.25 SENSITIVE Sensitive     GENTAMICIN 2 SENSITIVE Sensitive     IMIPENEM <=0.25 SENSITIVE Sensitive     NITROFURANTOIN 64 INTERMEDIATE Intermediate     TRIMETH/SULFA <=20 SENSITIVE Sensitive     AMPICILLIN/SULBACTAM <=2 SENSITIVE Sensitive     PIP/TAZO <=4 SENSITIVE Sensitive     Extended ESBL NEGATIVE Sensitive     * 10,000 COLONIES/mL ESCHERICHIA COLI  CULTURE, BLOOD (ROUTINE X 2) w Reflex to ID Panel     Status: None (Preliminary result)   Collection Time: 01/29/18  9:32 AM  Result Value Ref Range Status   Specimen Description BLOOD RIGH HAND  Final   Special Requests   Final    BOTTLES DRAWN AEROBIC AND ANAEROBIC Blood Culture adequate volume   Culture   Final    NO GROWTH 2 DAYS Performed at [facility] [place], [address], [place], [place] [address]    Report Status PENDING  Incomplete  CULTURE, BLOOD (ROUTINE X 2) w Reflex to ID Panel     Status: None (Preliminary result)   Collection Time: 01/29/18  9:43 AM  Result Value Ref Range Status   Specimen Description BLOOD LEFT HAND  Final   Special Requests   Final    BOTTLES DRAWN AEROBIC AND ANAEROBIC Blood Culture adequate volume   Culture   Final    NO GROWTH 2 DAYS Performed at [facility], [address] [place], [place] [address]    Report Status PENDING  Incomplete    Coagulation Studies: Recent Labs    01/29/18 0432  LABPROT 14.9  INR 1.18    Urinalysis: No results for input(s): COLORURINE, LABSPEC, PHURINE, GLUCOSEU, HGBUR, BILIRUBINUR, KETONESUR, PROTEINUR, UROBILINOGEN, NITRITE, LEUKOCYTESUR in the last 72 hours.  Invalid input(s): APPERANCEUR    Imaging: Nm Pulmonary Perf And Vent  Result Date: 01/29/2018 CLINICAL DATA:  Suspected pulmonary embolus.  Intermediate probability. Negative D-dimer. EXAM: NUCLEAR MEDICINE VENTILATION - PERFUSION LUNG SCAN TECHNIQUE: Ventilation images were obtained in multiple projections using inhaled aerosol Tc-49m DTPA. Perfusion images were obtained in multiple projections after intravenous injection of Tc-28m-MAA. RADIOPHARMACEUTICALS:  34.5 mCi of Tc-67m DTPA aerosol inhalation and 4.3 mCi Tc47m-MAA IV COMPARISON:  Chest x-ray FINDINGS: Patchy ventilation perfusion matched defects with ventilation defects most more prominent than perfusion defects. This is a low probability for pulmonary embolus. IMPRESSION: Low probability scan for pulmonary embolus. Electronically Signed   By: Maisie Fus  Register   On: 01/29/2018 13:26   US Venous Img Lower Bilateral  Result Date: 01/29/2018 CLINICAL DATA:  Bilateral lower extremity pain and edema for the past week. Evaluate for DVT. EXAM: BILATERAL LOWER EXTREMITY VENOUS DOPPLER ULTRASOUND TECHNIQUE: Gray-scale sonography with graded compression, as well as color Doppler and duplex ultrasound were performed to evaluate the lower extremity deep venous systems from the level of the common femoral vein and including the common femoral, femoral, profunda femoral, popliteal and calf veins including the posterior  tibial, peroneal and gastrocnemius veins when visible. The superficial great saphenous vein was also interrogated. Spectral Doppler was utilized to evaluate flow at rest and with distal augmentation maneuvers in the common femoral, femoral and popliteal veins. COMPARISON:  None. FINDINGS: RIGHT LOWER EXTREMITY Common Femoral Vein: No evidence of thrombus. Normal compressibility, respiratory phasicity and response to augmentation. Saphenofemoral Junction: No evidence of thrombus. Normal compressibility and flow on color Doppler imaging. Profunda Femoral Vein: No evidence of thrombus. Normal compressibility and flow on color Doppler imaging. Femoral Vein: No evidence of thrombus. Normal  compressibility, respiratory phasicity and response to augmentation. Popliteal Vein: No evidence of thrombus. Normal compressibility, respiratory phasicity and response to augmentation. Calf Veins: No evidence of thrombus. Normal compressibility and flow on color Doppler imaging. Superficial Great Saphenous Vein: No evidence of thrombus. Normal compressibility. Venous Reflux:  None. Other Findings:  None. LEFT LOWER EXTREMITY Common Femoral Vein: No evidence of thrombus. Normal compressibility, respiratory phasicity and response to augmentation. Saphenofemoral Junction: No evidence of thrombus. Normal compressibility and flow on color Doppler imaging. Profunda Femoral Vein: No evidence of thrombus. Normal compressibility and flow on color Doppler imaging. Femoral Vein: No evidence of thrombus. Normal compressibility, respiratory phasicity and response to augmentation. Popliteal Vein: No evidence of thrombus. Normal compressibility, respiratory phasicity and response to augmentation. Calf Veins: No evidence of thrombus. Normal compressibility and flow on color Doppler imaging. Superficial Great Saphenous Vein: No evidence of thrombus. Normal compressibility. Venous Reflux:  None. Other Findings:  None. IMPRESSION: No evidence DVT within either lower extremity. Electronically Signed   By: Simonne Come M.D.   On: 01/29/2018 10:46     Medications:   . sodium chloride 75 mL/hr at 01/31/18 0731  . cefTRIAXone (ROCEPHIN)  IV 1 g (01/31/18 1005)  . famotidine (PEPCID) IV 20 mg (01/31/18 0856)   . apixaban  2.5 mg Oral BID  . chlorhexidine  15 mL Mouth Rinse BID  . insulin aspart  0-9 Units Subcutaneous Q4H  . insulin glargine  14 Units Subcutaneous QHS  . mouth rinse  15 mL Mouth Rinse q12n4p  . metoprolol tartrate  50 mg Oral BID   acetaminophen **OR** acetaminophen, ondansetron **OR** ondansetron (ZOFRAN) IV, promethazine  Assessment/ Plan:  77 y.o. caucasian female with medical problems of Diabetes,  HTN, HLD, salivary gland stones who was admitted to [facility] on 01/27/2018 for evaluation of generalized fatigue and is Dx with severe sepsis, UTI and ARF.  1. ARF, multifactorial 2. B/l hydronephrosis- b/l ureteral stent placed 9/23 by Dr Annabell Howells 3. Sepsis, Gram positive bacteremia  9/22, negative cultures 9/24 4. Acidosis, Hypernatremia, Hypokalemia 5. UTI- E Coli 6. CKD st 3, Baseline Cr 1.38/ GFR 36 from jan 2019 7. DM-2 with CKD 8. New onset A Fib 9. Enlarging LUL nodule 10. 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 Patient is s/p b/l ureteral stent placement.  S Creatinine and urine output continue to improve Iv ceftriaxone  No acute indication of HD at present avoid nephrotoxins, hypotension Urology follow up Will follow   LOS: 4 Tiffany Manning [facility] 9/26/201910:11 AM  [facility] [place], [place] [phone]  Note: This note was prepared with Dragon dictation. Any transcription errors are unintentional

## 2018-01-31 NOTE — Progress Notes (Signed)
Progress Note  Patient Name: Tiffany Manning Date of Encounter: 01/31/2018  Primary Cardiologist: [facility]- [facility]  Subjective   No reported chest pain. She states she "feels stronger and better today." Her only complaint is that her soup has noodles (after asking for broth) and lacks any salt. We discussed lifestyle changes at that time, including reducing salt intake.   Also discussed were the new medications / medication changes. No nausea s/p resuming diet per patient report.   Starting on Eliquis in setting of Afib. Discussed with nephrology - if biopsy for squamous cell in future, will need to hold Eliquis 2-3 days prior to procedure and resume when safe per treating provider after procedure.    Inpatient Medications    Scheduled Meds: . apixaban  2.5 mg Oral BID  . chlorhexidine  15 mL Mouth Rinse BID  . insulin aspart  0-9 Units Subcutaneous Q4H  . insulin glargine  14 Units Subcutaneous QHS  . mouth rinse  15 mL Mouth Rinse q12n4p  . metoprolol tartrate  50 mg Oral BID   Continuous Infusions: . sodium chloride 75 mL/hr at 01/31/18 0731  . cefTRIAXone (ROCEPHIN)  IV    . famotidine (PEPCID) IV 20 mg (01/31/18 0856)   PRN Meds: acetaminophen **OR** acetaminophen, ondansetron **OR** ondansetron (ZOFRAN) IV, promethazine   Vital Signs    Vitals:   01/30/18 1611 01/30/18 1935 01/31/18 0424 01/31/18 0740  BP: (!) 142/89 138/88 137/80 (!) 155/84  Pulse: 92 (!) 109 99 89  Resp:    20  Temp: 97.6 F (36.4 C) 98 F (36.7 C) 98.1 F (36.7 C) 98.2 F (36.8 C)  TempSrc: Oral Oral Oral Oral  SpO2: 98% 98% 98% 100%  Weight:      Height:        Intake/Output Summary (Last 24 hours) at 01/31/2018 1005 Last data filed at 01/31/2018 0515 Gross per 24 hour  Intake 764 ml  Output 1050 ml  Net -286 ml   Filed Weights   01/27/18 0840 01/27/18 1122  Weight: 54.4 kg 57.4 kg    Telemetry    IRIR with HR in 80s-110s - Personally Reviewed  Physical Exam   GEN: No acute  distress. Frail elderly woman. Eating soup with husband at side Neck: CVP remains elevated  Cardiac: IRIR, no murmurs, rubs, or gallops.  Respiratory: Reduced breath sounds at b/l lung bases. Wheezing continues to improve, minimal. GI: Soft, nontender, non-distended  MS: No edema; No deformity. Left leg remains bandaged d/t chronic wound. Per patient's husband, plan to follow-up at wound center Neuro:  Nonfocal  Psych: Normal affect   Labs    Chemistry Recent Labs  Lab 01/27/18 0845  01/29/18 0432 01/30/18 0218 01/31/18 0454  NA 139   < > 146* 147* 146*  K 3.7   < > 4.7 3.9 3.4*  CL 106   < > 117* 120* 118*  CO2 8*   < > 17* 20* 20*  GLUCOSE 328*   < > 256* 135* 84  BUN 96*   < > 59* 48* 38*  CREATININE 3.40*   < > 2.31* 1.96* 1.78*  CALCIUM 8.9   < > 7.6* 7.8* 7.6*  PROT 7.9  --  5.9*  --   --   ALBUMIN 3.8  --  2.7*  --   --   AST 21  --  19  --   --   ALT 23  --  14  --   --  ALKPHOS 104  --  67  --   --   BILITOT 1.0  --  0.7  --   --   GFRNONAA 12*   < > 19* 23* 26*  GFRAA 14*   < > 22* 27* 30*  ANIONGAP 25*   < > 12 7 8    < > = values in this interval not displayed.     Hematology Recent Labs  Lab 01/29/18 0432 01/30/18 0218 01/31/18 0454  WBC 12.0* 11.8* 9.1  RBC 2.90* 2.71* 2.52*  HGB 8.8* 8.2* 7.9*  HCT 27.1* 25.4* 24.0*  MCV 93.3 93.7 95.1  MCH 30.4 30.3 31.2  MCHC 32.5 32.4 32.9  RDW 15.1* 15.9* 15.7*  PLT 271 215 193    Cardiac EnzymesNo results for input(s): TROPONINI in the last 168 hours. No results for input(s): TROPIPOC in the last 168 hours.   BNPNo results for input(s): BNP, PROBNP in the last 168 hours.   DDimer No results for input(s): DDIMER in the last 168 hours.   Radiology    Nm Pulmonary Perf And Vent  Result Date: 01/29/2018 CLINICAL DATA:  Suspected pulmonary embolus. Intermediate probability. Negative D-dimer. EXAM: NUCLEAR MEDICINE VENTILATION - PERFUSION LUNG SCAN TECHNIQUE: Ventilation images were obtained in multiple  projections using inhaled aerosol Tc-16m DTPA. Perfusion images were obtained in multiple projections after intravenous injection of Tc-17m-MAA. RADIOPHARMACEUTICALS:  34.5 mCi of Tc-12m DTPA aerosol inhalation and 4.3 mCi Tc26m-MAA IV COMPARISON:  Chest x-ray FINDINGS: Patchy ventilation perfusion matched defects with ventilation defects most more prominent than perfusion defects. This is a low probability for pulmonary embolus. IMPRESSION: Low probability scan for pulmonary embolus. Electronically Signed   By: Maisie Fus  Register   On: 01/29/2018 13:26   US Venous Img Lower Bilateral  Result Date: 01/29/2018 CLINICAL DATA:  Bilateral lower extremity pain and edema for the past week. Evaluate for DVT. EXAM: BILATERAL LOWER EXTREMITY VENOUS DOPPLER ULTRASOUND TECHNIQUE: Gray-scale sonography with graded compression, as well as color Doppler and duplex ultrasound were performed to evaluate the lower extremity deep venous systems from the level of the common femoral vein and including the common femoral, femoral, profunda femoral, popliteal and calf veins including the posterior tibial, peroneal and gastrocnemius veins when visible. The superficial great saphenous vein was also interrogated. Spectral Doppler was utilized to evaluate flow at rest and with distal augmentation maneuvers in the common femoral, femoral and popliteal veins. COMPARISON:  None. FINDINGS: RIGHT LOWER EXTREMITY Common Femoral Vein: No evidence of thrombus. Normal compressibility, respiratory phasicity and response to augmentation. Saphenofemoral Junction: No evidence of thrombus. Normal compressibility and flow on color Doppler imaging. Profunda Femoral Vein: No evidence of thrombus. Normal compressibility and flow on color Doppler imaging. Femoral Vein: No evidence of thrombus. Normal compressibility, respiratory phasicity and response to augmentation. Popliteal Vein: No evidence of thrombus. Normal compressibility, respiratory phasicity and  response to augmentation. Calf Veins: No evidence of thrombus. Normal compressibility and flow on color Doppler imaging. Superficial Great Saphenous Vein: No evidence of thrombus. Normal compressibility. Venous Reflux:  None. Other Findings:  None. LEFT LOWER EXTREMITY Common Femoral Vein: No evidence of thrombus. Normal compressibility, respiratory phasicity and response to augmentation. Saphenofemoral Junction: No evidence of thrombus. Normal compressibility and flow on color Doppler imaging. Profunda Femoral Vein: No evidence of thrombus. Normal compressibility and flow on color Doppler imaging. Femoral Vein: No evidence of thrombus. Normal compressibility, respiratory phasicity and response to augmentation. Popliteal Vein: No evidence of thrombus. Normal compressibility, respiratory phasicity and response to  augmentation. Calf Veins: No evidence of thrombus. Normal compressibility and flow on color Doppler imaging. Superficial Great Saphenous Vein: No evidence of thrombus. Normal compressibility. Venous Reflux:  None. Other Findings:  None. IMPRESSION: No evidence DVT within either lower extremity. Electronically Signed   By: Simonne Come M.D.   On: 01/29/2018 10:46    Cardiac Studies   TTE 01/28/2018 - Left ventricle: The cavity size was normal. Wall thickness was increased in a pattern of mild LVH. Systolic function was normal. The estimated ejection fraction was in the range of 55% to 65%. - Aortic valve: Valve area (Vmax): 2.21 cm^2. - Mitral valve: There was mild regurgitation. - Right atrium: The atrium was mildly dilated. - Tricuspid valve: There was moderate regurgitation.   Patient Profile     77 y.o. female DM2, HLD, HTN who is being seen s/p bilateral ureteral stents by Dr. Annabell Howells w/ b/l nephrosis and possible bladder cancer sus[ected and acute on chronic kidney injury / chronic UTI for the evaluation of newly diagnosed Atrial Fibrillation with RVR (of unknown onset) and at the  request of Dr. Sherryll Burger.  Assessment & Plan    Atrial Fibrillation with Rapid Ventricular Rate, unknown date of onset - No reported CP today. No SOB. Tolerating restart of diet. - Echo reviewed with patient and husband and as above - hyperdynamic LV contraction. RV appears dilated with mod-severe pulonary HTN: chronic lung disease v. PE. Future workup might include CT angio if Cr returns to baseline. - TSH 1.949 (9/23); Potassium 3.2    3.9; Magnesium 2.1   2.0 - Renal function improving 2.44  2.62  2.46  2.42  2.31  1.96  1.78 (baseline ~1.3). - Afib with RVR of unknown date of onset -rates likely exacerbated by hypotension and and chronic kidney infection. Consider rebound tachycardia in setting of emesis / poor intake of medication.  - CHA2DS2VASc score of at least  5 (HTN, age x2, DM, Female). Starting on Eliquis 2.5mg  BID today.  - Plan: Increase Metoprolol tartrate 50mg  BID to 75mg  given BP room. Stopping heparin. Starting Eliquis 2.5mg  BID (renal dosing) d/t stroke risk with Afib. She will need long term anticoagulation at discharge. If bladder bx as outpatient, will need to hold Eliquis prior to and after procedure. Continue to monitor vitals, daily CBC, daily BMET to monitor electrolytes and renal function/ kidney injury. Gentle IVF d/t RV strain as noted in echo.   Acute on Chronic Renal Failure - Renal function improving - Caution with fluids in setting of right heart strain.  - Monitor I&O's / urinary output / weights.  - Daily BMP.  - Caution with nephrotoxic agents.  - Hold metformin containing medications (Metaglip), diuretics & restart if renal function returns to baseline.  - Renal dosing for any renally cleared medications. Renal dose of Eliquis as above. - Continue to monitor electrolytes - Per IM, nephrology  HTN  - BP 155/84 - will increase metoprolol from 50  metoprolol 75mg  BID for additional BP/HR control. HR 89bpm. - Home medications include atenolol and maxide-25 -  continue to hold. - Continue to monitor vitals  HLD - Home statin - currently held.  - Restarting statin today as labs show normal liver function.  - Per IM  DM2 - Hyperglycemic - SSI - Continue to hold metformin d/t renal insufficiency - Per IM  Remaining per IM   For questions or updates, please contact [facility] Please consult [email or web address] for contact info under  Signed, Lennon Alstrom, PA-C  01/31/2018, 10:05 AM

## 2018-01-31 NOTE — Plan of Care (Signed)
Patient complained of left leg pain after changing the dressing on her wound. Patient stated the dressing has not been changed since admission. VSS. Afebrile.

## 2018-02-01 LAB — GLUCOSE, CAPILLARY
GLUCOSE-CAPILLARY: 100 mg/dL — AB (ref 70–99)
GLUCOSE-CAPILLARY: 55 mg/dL — AB (ref 70–99)
GLUCOSE-CAPILLARY: 59 mg/dL — AB (ref 70–99)
GLUCOSE-CAPILLARY: 64 mg/dL — AB (ref 70–99)
GLUCOSE-CAPILLARY: 72 mg/dL (ref 70–99)
GLUCOSE-CAPILLARY: 79 mg/dL (ref 70–99)
GLUCOSE-CAPILLARY: 99 mg/dL (ref 70–99)
Glucose-Capillary: 114 mg/dL — ABNORMAL HIGH (ref 70–99)
Glucose-Capillary: 144 mg/dL — ABNORMAL HIGH (ref 70–99)
Glucose-Capillary: 57 mg/dL — ABNORMAL LOW (ref 70–99)
Glucose-Capillary: 60 mg/dL — ABNORMAL LOW (ref 70–99)
Glucose-Capillary: 74 mg/dL (ref 70–99)
Glucose-Capillary: 98 mg/dL (ref 70–99)

## 2018-02-01 LAB — CBC
HEMATOCRIT: 24.4 % — AB (ref 35.0–47.0)
Hemoglobin: 8.1 g/dL — ABNORMAL LOW (ref 12.0–16.0)
MCH: 31.5 pg (ref 26.0–34.0)
MCHC: 33.1 g/dL (ref 32.0–36.0)
MCV: 95 fL (ref 80.0–100.0)
Platelets: 196 10*3/uL (ref 150–440)
RBC: 2.57 MIL/uL — ABNORMAL LOW (ref 3.80–5.20)
RDW: 15.7 % — AB (ref 11.5–14.5)
WBC: 8.6 10*3/uL (ref 3.6–11.0)

## 2018-02-01 LAB — BASIC METABOLIC PANEL
Anion gap: 8 (ref 5–15)
BUN: 30 mg/dL — AB (ref 8–23)
CALCIUM: 7.2 mg/dL — AB (ref 8.9–10.3)
CO2: 20 mmol/L — ABNORMAL LOW (ref 22–32)
CREATININE: 1.37 mg/dL — AB (ref 0.44–1.00)
Chloride: 112 mmol/L — ABNORMAL HIGH (ref 98–111)
GFR calc Af Amer: 42 mL/min — ABNORMAL LOW (ref >60)
GFR calc non Af Amer: 36 mL/min — ABNORMAL LOW (ref >60)
GLUCOSE: 88 mg/dL (ref 70–99)
Potassium: 2.9 mmol/L — ABNORMAL LOW (ref 3.5–5.1)
Sodium: 140 mmol/L (ref 135–145)

## 2018-02-01 MED ORDER — SODIUM CHLORIDE 0.9 % IV SOLN
2.0000 g | INTRAVENOUS | Status: DC
  Administered 2018-02-02 – 2018-02-06 (×5): 2 g via INTRAVENOUS
  Filled 2018-02-01 (×5): qty 2

## 2018-02-01 MED ORDER — DEXTROSE 50 % IV SOLN
INTRAVENOUS | Status: AC
  Filled 2018-02-01: qty 50

## 2018-02-01 MED ORDER — DEXTROSE 50 % IV SOLN
25.0000 mL | Freq: Once | INTRAVENOUS | Status: AC
  Administered 2018-02-01: 25 mL via INTRAVENOUS

## 2018-02-01 MED ORDER — DILTIAZEM HCL ER 60 MG PO CP12
60.0000 mg | ORAL_CAPSULE | Freq: Two times a day (BID) | ORAL | Status: DC
  Administered 2018-02-01 – 2018-02-06 (×9): 60 mg via ORAL
  Filled 2018-02-01 (×11): qty 1

## 2018-02-01 MED ORDER — FAMOTIDINE IN NACL 20-0.9 MG/50ML-% IV SOLN
20.0000 mg | INTRAVENOUS | Status: DC
  Administered 2018-02-02: 20 mg via INTRAVENOUS
  Filled 2018-02-01: qty 50

## 2018-02-01 MED ORDER — POTASSIUM CHLORIDE CRYS ER 20 MEQ PO TBCR: 60.0000 meq | EXTENDED_RELEASE_TABLET | Freq: Once | ORAL | Status: DC

## 2018-02-01 MED ORDER — POTASSIUM CHLORIDE 10 MEQ/100ML IV SOLN
10.0000 meq | INTRAVENOUS | Status: AC
  Administered 2018-02-01 (×6): 10 meq via INTRAVENOUS
  Filled 2018-02-01 (×6): qty 100

## 2018-02-01 NOTE — Progress Notes (Signed)
[facility] [place], [place] 02/01/18  Subjective:   Patient feels fair this morning.  Trying to eat more but still nauseous.  Appetite remains suboptimal Urine output has improved to 1575 cc.  Creatinine has improved to 1.37.  Potassium is low this morning at 2.9   Objective:  Vital signs in last 24 hours:  Temp:  [98 F (36.7 C)-98.3 F (36.8 C)] 98.1 F (36.7 C) (09/27 0811) Pulse Rate:  [83-99] 83 (09/27 0811) Resp:  [18-20] 18 (09/27 0811) BP: (139-154)/(77-99) 144/77 (09/27 0811) SpO2:  [96 %-98 %] 97 % (09/27 0811) Weight:  [62.1 kg] 62.1 kg (09/27 0320)  Weight change:  Filed Weights   01/27/18 0840 01/27/18 1122 02/01/18 0320  Weight: 54.4 kg 57.4 kg 62.1 kg    Intake/Output:    Intake/Output Summary (Last 24 hours) at 02/01/2018 1536 Last data filed at 02/01/2018 1428 Gross per 24 hour  Intake 949.9 ml  Output 1525 ml  Net -575.1 ml     Physical Exam: General: Ill appearing  HEENT moist oral mucus membranes  Neck supple  Pulm/lungs Decreased breath sounds at bases  CVS/Heart Tachycardic, irregular  Abdomen:  Soft, NT  Extremities: trace edema  Neurologic: Alert, follows few simple commands  Skin: warm   Foley in place       Basic Metabolic Panel:  Recent Labs  Lab 01/27/18 1905  01/28/18 0839 01/28/18 1313  01/28/18 2050 01/29/18 0432 01/30/18 0218 01/31/18 0454 02/01/18 0447  NA 140   < > 148* 143   < > 144 146* 147* 146* 140  K 3.2*   < > 3.2* 4.3   < > 3.9 4.7 3.9 3.4* 2.9*  CL 117*   < > 114* 115*   < > 116* 117* 120* 118* 112*  CO2 <7*   < > 20* 15*   < > 18* 17* 20* 20* 20*  GLUCOSE 203*   < > 159* 317*   < > 127* 256* 135* 84 88  BUN 83*   < > 71* 72*   < > 67* 59* 48* 38* 30*  CREATININE 2.60*   < > 2.44* 2.62*   < > 2.42* 2.31* 1.96* 1.78* 1.37*  CALCIUM 8.0*   < > 8.0* 7.6*   < > 7.4* 7.6* 7.8* 7.6* 7.2*  MG 2.1  --  2.0 1.8  --   --  1.6* 2.0  --   --   PHOS 4.7*  --  1.6*  --   --   --  2.2* 2.4*  --    --    < > = values in this interval not displayed.     CBC: Recent Labs  Lab 01/27/18 0845  01/28/18 0257 01/29/18 0432 01/30/18 0218 01/31/18 0454 02/01/18 0447  WBC 22.3*  --  14.1* 12.0* 11.8* 9.1 8.6  NEUTROABS 19.5*  --   --  9.7*  --   --   --   HGB 11.7*   < > 9.8* 8.8* 8.2* 7.9* 8.1*  HCT 37.2   < > 30.2* 27.1* 25.4* 24.0* 24.4*  MCV 97.1  --  92.5 93.3 93.7 95.1 95.0  PLT 491*  --  327 271 215 193 196   < > = values in this interval not displayed.     No results found for: HEPBSAG, HEPBSAB, HEPBIGM    Microbiology:  Recent Results (from the past 240 hour(s))  Blood Culture (routine x 2)     Status: Abnormal  Collection Time: 01/27/18  8:40 AM  Result Value Ref Range Status   Specimen Description   Final    BLOOD RIGHT ANTECUBITAL Performed at [facility], [address] [place], [place] [address]    Special Requests   Final    BOTTLES DRAWN AEROBIC AND ANAEROBIC Blood Culture adequate volume Performed at [facility], [address] [place], [place] [address]    Culture  Setup Time   Final    AEROBIC BOTTLE ONLY GRAM POSITIVE COCCI CRITICAL RESULT CALLED TO, READ BACK BY AND VERIFIED WITH: MATT [facility] 01/28/18 0618 REC Performed at [facility], [address], [place], [place] [address]    Culture STAPHYLOCOCCUS HOMINIS (A)  Final   Report Status 01/30/2018 FINAL  Final   Organism ID, Bacteria STAPHYLOCOCCUS HOMINIS  Final      Susceptibility   Staphylococcus hominis - MIC*    CIPROFLOXACIN >=8 RESISTANT Resistant     ERYTHROMYCIN <=0.25 SENSITIVE Sensitive     GENTAMICIN <=0.5 SENSITIVE Sensitive     OXACILLIN >=4 RESISTANT Resistant     TETRACYCLINE >=16 RESISTANT Resistant     VANCOMYCIN <=0.5 SENSITIVE Sensitive     TRIMETH/SULFA >=320 RESISTANT Resistant     CLINDAMYCIN <=0.25 SENSITIVE Sensitive     RIFAMPIN <=0.5 SENSITIVE Sensitive     Inducible Clindamycin NEGATIVE Sensitive     * STAPHYLOCOCCUS HOMINIS   Blood Culture ID Panel (Reflexed)     Status: Abnormal   Collection Time: 01/27/18  8:40 AM  Result Value Ref Range Status   Enterococcus species NOT DETECTED NOT DETECTED Final   Listeria monocytogenes NOT DETECTED NOT DETECTED Final   Staphylococcus species DETECTED (A) NOT DETECTED Final    Comment: Methicillin (oxacillin) resistant coagulase negative staphylococcus. Possible blood culture contaminant (unless isolated from more than one blood culture draw or clinical case suggests pathogenicity). No antibiotic treatment is indicated for blood  culture contaminants. CRITICAL RESULT CALLED TO, READ BACK BY AND VERIFIED WITH: MATT [facility] 01/28/18 0618 REC    Staphylococcus aureus NOT DETECTED NOT DETECTED Final   Methicillin resistance DETECTED (A) NOT DETECTED Final    Comment: CRITICAL RESULT CALLED TO, READ BACK BY AND VERIFIED WITH: MATT [facility]CB[facility] 01/28/18 0618 REC    Streptococcus species NOT DETECTED NOT DETECTED Final   Streptococcus agalactiae NOT DETECTED NOT DETECTED Final   Streptococcus pneumoniae NOT DETECTED NOT DETECTED Final   Streptococcus pyogenes NOT DETECTED NOT DETECTED Final   Acinetobacter baumannii NOT DETECTED NOT DETECTED Final   Enterobacteriaceae species NOT DETECTED NOT DETECTED Final   Enterobacter cloacae complex NOT DETECTED NOT DETECTED Final   Escherichia coli NOT DETECTED NOT DETECTED Final   Klebsiella oxytoca NOT DETECTED NOT DETECTED Final   Klebsiella pneumoniae NOT DETECTED NOT DETECTED Final   Proteus species NOT DETECTED NOT DETECTED Final   Serratia marcescens NOT DETECTED NOT DETECTED Final   Haemophilus influenzae NOT DETECTED NOT DETECTED Final   Neisseria meningitidis NOT DETECTED NOT DETECTED Final   Pseudomonas aeruginosa NOT DETECTED NOT DETECTED Final   Candida albicans NOT DETECTED NOT DETECTED Final   Candida glabrata NOT DETECTED NOT DETECTED Final   Candida krusei NOT DETECTED NOT DETECTED Final   Candida parapsilosis NOT DETECTED  NOT DETECTED Final   Candida tropicalis NOT DETECTED NOT DETECTED Final    Comment: Performed at [facility], [address] [place], [place] [address]  Blood Culture (routine x 2)     Status: Abnormal   Collection Time:  01/27/18  8:46 AM  Result Value Ref Range Status   Specimen Description   Final    BLOOD LEFT ANTECUBITAL Performed at [facility], [address] [place], [place] [address]    Special Requests   Final    BOTTLES DRAWN AEROBIC AND ANAEROBIC Blood Culture adequate volume Performed at [facility], [address] [place], [place] [address]    Culture  Setup Time   Final    GRAM POSITIVE COCCI AEROBIC BOTTLE ONLY CRITICAL VALUE NOTED.  VALUE IS CONSISTENT WITH PREVIOUSLY REPORTED AND CALLED VALUE. Performed at [facility], [address] [place], [place] [address]    Culture (A)  Final    STAPHYLOCOCCUS CAPITIS THE SIGNIFICANCE OF ISOLATING THIS ORGANISM FROM A SINGLE SET OF BLOOD CULTURES WHEN MULTIPLE SETS ARE DRAWN IS UNCERTAIN. PLEASE NOTIFY THE MICROBIOLOGY DEPARTMENT WITHIN ONE WEEK IF SPECIATION AND SENSITIVITIES ARE REQUIRED. Performed at [facility], [address], [place], [place] [address]    Report Status 01/30/2018 FINAL  Final  Urine Culture     Status: Abnormal   Collection Time: 01/27/18  9:45 AM  Result Value Ref Range Status   Specimen Description   Final    URINE, CATHETERIZED Performed at [facility], [address] [place], [place] [address]    Special Requests   Final    NONE Performed at [facility], [address] [place], [place] [address]    Culture >=100,000 COLONIES/mL ESCHERICHIA COLI (A)  Final   Report Status 01/30/2018 FINAL  Final   Organism ID, Bacteria ESCHERICHIA COLI (A)  Final      Susceptibility   Escherichia coli - MIC*    AMPICILLIN 16 INTERMEDIATE Intermediate     CEFAZOLIN <=4 SENSITIVE Sensitive     CEFTRIAXONE <=1 SENSITIVE Sensitive      CIPROFLOXACIN <=0.25 SENSITIVE Sensitive     GENTAMICIN <=1 SENSITIVE Sensitive     IMIPENEM <=0.25 SENSITIVE Sensitive     NITROFURANTOIN 32 SENSITIVE Sensitive     TRIMETH/SULFA <=20 SENSITIVE Sensitive     AMPICILLIN/SULBACTAM 8 SENSITIVE Sensitive     PIP/TAZO <=4 SENSITIVE Sensitive     Extended ESBL NEGATIVE Sensitive     * >=100,000 COLONIES/mL ESCHERICHIA COLI  MRSA PCR Screening     Status: None   Collection Time: 01/27/18 11:25 AM  Result Value Ref Range Status   MRSA by PCR NEGATIVE NEGATIVE Final    Comment:        The GeneXpert MRSA Assay (FDA approved for NASAL specimens only), is one component of a comprehensive MRSA colonization surveillance program. It is not intended to diagnose MRSA infection nor to guide or monitor treatment for MRSA infections. Performed at [facility], [address] [place], [place] [address]   C difficile quick scan w PCR reflex     Status: None   Collection Time: 01/28/18  1:14 PM  Result Value Ref Range Status   C Diff antigen NEGATIVE NEGATIVE Final   C Diff toxin NEGATIVE NEGATIVE Final   C Diff interpretation No C. difficile detected.  Final    Comment: Performed at [facility], [address], [place], [place] [address]  Urine Culture     Status: None   Collection Time: 01/28/18  6:31 PM  Result Value Ref Range Status   Specimen Description   Final    URINE, RANDOM Performed at [facility], [address], [place], [place] [address]  Special Requests RIGHT RENAL PELVIS  Final   Culture   Final    NO GROWTH Performed at [facility], [address], [place], [place] [address]    Report Status 01/30/2018 FINAL  Final  Urine Culture     Status: Abnormal   Collection Time: 01/28/18  6:33 PM  Result Value Ref Range Status   Specimen Description   Final    URINE, RANDOM Performed at [facility], [address] [place], [place] [address]    Special Requests   Final     LEFT KIDNEY Performed at [facility], [address], [place], [place] [address]    Culture 10,000 COLONIES/mL ESCHERICHIA COLI (A)  Final   Report Status 01/31/2018 FINAL  Final   Organism ID, Bacteria ESCHERICHIA COLI (A)  Final      Susceptibility   Escherichia coli - MIC*    AMPICILLIN <=2 SENSITIVE Sensitive     CEFAZOLIN <=4 SENSITIVE Sensitive     CEFTRIAXONE <=1 SENSITIVE Sensitive     CIPROFLOXACIN <=0.25 SENSITIVE Sensitive     GENTAMICIN 2 SENSITIVE Sensitive     IMIPENEM <=0.25 SENSITIVE Sensitive     NITROFURANTOIN 64 INTERMEDIATE Intermediate     TRIMETH/SULFA <=20 SENSITIVE Sensitive     AMPICILLIN/SULBACTAM <=2 SENSITIVE Sensitive     PIP/TAZO <=4 SENSITIVE Sensitive     Extended ESBL NEGATIVE Sensitive     * 10,000 COLONIES/mL ESCHERICHIA COLI  CULTURE, BLOOD (ROUTINE X 2) w Reflex to ID Panel     Status: None (Preliminary result)   Collection Time: 01/29/18  9:32 AM  Result Value Ref Range Status   Specimen Description BLOOD RIGH HAND  Final   Special Requests   Final    BOTTLES DRAWN AEROBIC AND ANAEROBIC Blood Culture adequate volume   Culture   Final    NO GROWTH 3 DAYS Performed at [facility], [address] [place], [place] [address]    Report Status PENDING  Incomplete  CULTURE, BLOOD (ROUTINE X 2) w Reflex to ID Panel     Status: None (Preliminary result)   Collection Time: 01/29/18  9:43 AM  Result Value Ref Range Status   Specimen Description BLOOD LEFT HAND  Final   Special Requests   Final    BOTTLES DRAWN AEROBIC AND ANAEROBIC Blood Culture adequate volume   Culture   Final    NO GROWTH 3 DAYS Performed at [facility], [address] [place], [place] [address]    Report Status PENDING  Incomplete    Coagulation Studies: No results for input(s): LABPROT, INR in the last 72 hours.  Urinalysis: No results for input(s): COLORURINE, LABSPEC, PHURINE, GLUCOSEU, HGBUR, BILIRUBINUR, KETONESUR, PROTEINUR,  UROBILINOGEN, NITRITE, LEUKOCYTESUR in the last 72 hours.  Invalid input(s): APPERANCEUR    Imaging: No results found.   Medications:   . sodium chloride 75 mL/hr at 01/31/18 2111  . [START ON 02/02/2018] cefTRIAXone (ROCEPHIN)  IV    . [START ON 02/02/2018] famotidine (PEPCID) IV    . potassium chloride 10 mEq (02/01/18 1521)   . apixaban  2.5 mg Oral BID  . chlorhexidine  15 mL Mouth Rinse BID  . dextrose      . insulin aspart  0-9 Units Subcutaneous Q4H  . mouth rinse  15 mL Mouth Rinse q12n4p  . metoprolol tartrate  75 mg Oral BID  . simvastatin  40 mg Oral q1800  . sodium chloride flush  3 mL  Intravenous Q12H   acetaminophen **OR** acetaminophen, ondansetron **OR** ondansetron (ZOFRAN) IV, promethazine, traMADol  Assessment/ Plan:  77 y.o. caucasian female with medical problems of Diabetes, HTN, HLD, salivary gland stones who was admitted to [facility] on 01/27/2018 for evaluation of generalized fatigue and is Dx with severe sepsis, UTI and ARF.  1. ARF, multifactorial 2. B/l hydronephrosis- b/l ureteral stent placed 9/23 by Dr Annabell Howells 3. Sepsis, Gram positive bacteremia  9/22, negative cultures 9/24 4. Acidosis, Hypernatremia, Hypokalemia 5. UTI- E Coli 6. CKD st 3, Baseline Cr 1.38/ GFR 36 from jan 2019 7. DM-2 with CKD 8. New onset A Fib 9. Enlarging LUL nodule 10. 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 Patient is s/p b/l ureteral stent placement.  S Creatinine and urine output continue to improve Iv ceftriaxone  avoid nephrotoxins, hypotension Urology follow up   LOS: 5 Tiffany Manning [facility] 9/27/20193:36 PM  [facility] [place], [place] [phone]  Note: This note was prepared with Dragon dictation. Any transcription errors are unintentional

## 2018-02-01 NOTE — Treatment Plan (Signed)
Diagnosis: Complicated pyelonephritis Baseline Creatinine 1.37  Culture Result:E.coli  Allergies  Allergen Reactions  . Sulfa Antibiotics Nausea And Vomiting    OPAT Orders Discharge antibiotics: Ceftriaxone 2 grams IV every 24 hrs until 02/11/18  [facility] Care Per Protocol:  Labs weekly while on IV antibiotics: _X_ CBC with differential X CMP  __ Please pull PIC at completion of IV antibiotics  Fax weekly labs to [phone]  Call Dr.Solana Coggin with any questions [phone]

## 2018-02-01 NOTE — Progress Notes (Signed)
PT Cancellation Note  Patient Details Name: Tiffany Manning MRN: [id number] DOB: October 09, 1940   Cancelled Treatment:     Chart reviewed, noted decreased potassium level. Spoke with RN who expressed concerns about decreased potassium and increased nausea this morning.  Pt not medically appropriate for evaluation this morning. Will attempt this afternoon.  Arvilla Meres, SPT   Arvilla Meres 02/01/2018, 9:56 AM

## 2018-02-01 NOTE — Progress Notes (Addendum)
Hypoglycemic Event  CBG: 60  Treatment: 25cc of dextrose  Symptoms:n/a  Follow-up CBG: Time:0430 CBG Result:99  Possible Reasons for Event: poor oral intake Comments/MD notified:n/a    Eloisa Northern

## 2018-02-01 NOTE — Plan of Care (Signed)
Patient continues to experience nausea. Poor oral intake. Encourage patient to increase fluids.

## 2018-02-01 NOTE — Progress Notes (Signed)
Patient 's POCT is 49, attempted to have patient drink ginger ale but patient is still nauseous. Will administer 12.0.5 mg of  Iv phenergan. Will reassess POCT after nausea has resolved and patient can't have orals.

## 2018-02-01 NOTE — Progress Notes (Signed)
[facility] - [place] at [facility]   PATIENT NAME: Tiffany Manning    MR#:  [id number]  DATE OF BIRTH:  May 21, 1940  SUBJECTIVE:   Patient feeling a bit nauseated and dizzy this morning, was noted to be hypoglycemic which resolved with some dextrose.  She still feels increasingly weak.  Noted to be hypokalemic with a potassium of 2.9.  Renal function is improved.  REVIEW OF SYSTEMS:    Review of Systems  Constitutional: Negative for chills and fever.  HENT: Negative for congestion and tinnitus.   Eyes: Negative for blurred vision and double vision.  Respiratory: Negative for cough, shortness of breath and wheezing.   Cardiovascular: Negative for chest pain, orthopnea and PND.  Gastrointestinal: Positive for nausea. Negative for abdominal pain, diarrhea and vomiting.  Genitourinary: Negative for dysuria and hematuria.  Neurological: Positive for dizziness and weakness (Generalized). Negative for sensory change and focal weakness.  All other systems reviewed and are negative.   Nutrition: Heart healthy/Carb control Tolerating Diet: Yes Tolerating PT: Eval. Noted.   DRUG ALLERGIES:   Allergies  Allergen Reactions  . Sulfa Antibiotics Nausea And Vomiting    VITALS:  Blood pressure (!) 144/77, pulse 83, temperature 98.1 F (36.7 C), temperature source Oral, resp. rate 18, height 5' (1.524 m), weight 62.1 kg, SpO2 97 %.  PHYSICAL EXAMINATION:   Physical Exam  GENERAL:  77 y.o.-year-old thin patient lying in bed in NAD.     EYES: Pupils equal, round, reactive to light and accommodation. No scleral icterus. Extraocular muscles intact.  HEENT: Head atraumatic, normocephalic. Oropharynx and nasopharynx clear. Moist Oral Mucosa.   NECK:  Supple, no jugular venous distention. No thyroid enlargement, no tenderness.  LUNGS: Normal breath sounds bilaterally, no wheezing, rales, rhonchi. No use of accessory muscles of respiration.  CARDIOVASCULAR: S1, S2 Irregular. No  murmurs, rubs, or gallops.  ABDOMEN: Soft, nontender, nondistended. Bowel sounds present. No organomegaly or mass.  EXTREMITIES: No cyanosis, clubbing or edema b/l.    NEUROLOGIC: Cranial nerves II through XII are intact. No focal Motor or sensory deficits b/l. PSYCHIATRIC: The patient is alert and oriented x 3.  SKIN: No obvious rash, lesion, or ulcer.   Foley in place with yellow urine draining.    LABORATORY PANEL:   CBC Recent Labs  Lab 02/01/18 0447  WBC 8.6  HGB 8.1*  HCT 24.4*  PLT 196   ------------------------------------------------------------------------------------------------------------------  Chemistries  Recent Labs  Lab 01/29/18 0432 01/30/18 0218  02/01/18 0447  NA 146* 147*   < > 140  K 4.7 3.9   < > 2.9*  CL 117* 120*   < > 112*  CO2 17* 20*   < > 20*  GLUCOSE 256* 135*   < > 88  BUN 59* 48*   < > 30*  CREATININE 2.31* 1.96*   < > 1.37*  CALCIUM 7.6* 7.8*   < > 7.2*  MG 1.6* 2.0  --   --   AST 19  --   --   --   ALT 14  --   --   --   ALKPHOS 67  --   --   --   BILITOT 0.7  --   --   --    < > = values in this interval not displayed.   ------------------------------------------------------------------------------------------------------------------  Cardiac Enzymes No results for input(s): TROPONINI in the last 168 hours. ------------------------------------------------------------------------------------------------------------------  RADIOLOGY:  No results found.   ASSESSMENT AND PLAN:   77 year old female  with past medical history of diabetes, hypertension, hyperlipidemia, previous history of a neck abscess who presents to the hospital due to weakness, lethargy and malaise and noted to have severe sepsis secondary to UTI and also noted to be in acute diabetic ketoacidosis.  1.  Acute diabetic ketoacidosis- secondary to underlying sepsis.  - much improved and AG closed. Off insulin gtt and electrolytes stable. -Patient was a bit  hypoglycemic overnight therefore will DC Lantus, continue sliding scale insulin for now.  Appreciate diabetes coordinator input, but will hold off on starting glipizide until p.o. intake improves.  2.  Sepsis-patient has severe sepsis secondary to UTI. -Urine cultures are growing 100,000 colonies of E. coli.  Pansensitive and cont. IV Ceftriaxone. -  Blood cultures + for Staph Hominis and repeat cultures done growing staph capitis which is likely a skin contaminant and not likely significant.  Repeat BC on 9/24 are (-) so far.   3.  Urinary tract infection-source of patient's sepsis. Urine culture + E. Coli and it's pansensitive.   -Patient had a renal ultrasound done which showed some scarring and a CT scan of the abdomen pelvis suggestive of bilateral hydronephrosis.  -Seen by urology in the setting of severe sepsis and possible pyonephrosis of the left kidney patient is status post bilateral ureteral stent placement now. Discussed with urology and would leave Foley in until follow-up with them as outpatient.  4.  Acute kidney injury secondary to severe sepsis and underlying UTI combined with concomitant use of diuretics. -Creatinine much improved and back to baseline. - Seen by nephrology, like in the setting of multifactorial issues.  Patient is not oliguric.  No acute indication for dialysis as per nephrology.    5.  Atrial fibrillation with RVR- continue oral metoprolol and rates better controlled.  - cont. Eliquis.  Appreciate cardiology input.  Echocardiogram showing normal EF.  6.  Leukocytosis- secondary to sepsis/UTI. - resolved.   7.  Essential hypertension- cont. Metoprolol.     8. Anemia - likely anemia of chronic disease and will cont. To follow hg.  - no need for transfusion.   9. Hyperlipidemia - cont. Simvastatin.   OT eval noted and await PT eval. Pt. Although wants to go home.   All the records are reviewed and case discussed with Care Management/Social  Worker. Management plans discussed with the patient, family and they are in agreement.  CODE STATUS: Full code  DVT Prophylaxis: Hep SQ  TOTAL TIME TAKING CARE OF THIS PATIENT: 30 minutes.   POSSIBLE D/C IN 1-2 DAYS, DEPENDING ON CLINICAL CONDITION.   Houston Siren M.D on 02/01/2018 at 3:29 PM  Between 7am to 6pm - Pager - [phone]  After 6pm go to [email or web address] - Social research officer, government  [facility]  Hospitalists  Office  [phone]  CC: Primary care physician; Jaclyn Shaggy, MD

## 2018-02-01 NOTE — NC FL2 (Signed)
[place] MEDICAID FL2 LEVEL OF CARE SCREENING TOOL     IDENTIFICATION  Patient Name: Tiffany Manning Birthdate: [birth year] Sex: female Admission Date (Current Location): 01/27/2018  Tiffany Manning Tiffany Tiffany Manning Number:  Tiffany Manning Tiffany Manning:  Tiffany Manning, [address] [place], [place] [address]      Provider Number: [id number]  Attending Physician Name Tiffany Manning:  Tiffany Siren, Manning  Relative Name Tiffany Phone Number:       Current Level of Care: Manning Recommended Level of Care: Skilled Nursing Facility Prior Approval Number:    Date Approved/Denied:   PASRR Number: [id number])  Discharge Plan: SNF    Current Diagnoses: Patient Active Problem List   Diagnosis Date Noted  . Atrial fibrillation with rapid ventricular response (HCC)   . Severe sepsis (HCC) 01/27/2018  . Pressure injury of skin 01/27/2018  . Diabetic acidosis without coma (HCC)   . Protein-calorie malnutrition, severe 05/29/2017  . Acute renal failure (HCC) 05/28/2017  . Acute renal failure (ARF) (HCC) 05/28/2017    Orientation RESPIRATION BLADDER Height & Weight     Self, Time, Situation, Place  Normal Continent Weight: 137 lb (62.1 kg) Height:  5' (152.4 cm)  BEHAVIORAL SYMPTOMS/MOOD NEUROLOGICAL BOWEL NUTRITION STATUS      Continent Diet(Diet: Heart Healthy/ Carb Modified. )  AMBULATORY STATUS COMMUNICATION OF NEEDS Skin   Extensive Assist Verbally PU Stage Tiffany Appropriate Care(pressure ulcer stage 1 on coccyx. )                       Personal Care Assistance Level of Assistance  Bathing, Feeding, Dressing Bathing Assistance: Limited assistance Feeding assistance: Independent Dressing Assistance: Limited assistance     Functional Limitations Info  Sight, Hearing, Speech Sight Info: Adequate Hearing Info: Impaired Speech Info: Adequate    SPECIAL CARE FACTORS FREQUENCY  PT (By licensed PT), OT (By licensed OT)     PT Frequency: (5) OT  Frequency: (5)            Contractures      Additional Factors Info  Code Status, Allergies Code Status Info: (Full Code. ) Allergies Info: (Sulfa Antibiotics)           Current Medications (02/01/2018):  This is the current Manning active medication list Current Facility-Administered Medications  Medication Dose Route Frequency Provider Last Rate Last Dose  . 0.45 % sodium chloride infusion   Intravenous Continuous Tiffany Reno, Manning 75 mL/hr at 01/31/18 2111    . acetaminophen (TYLENOL) tablet 650 mg  650 mg Oral Q6H PRN Tiffany Manning   650 mg at 01/31/18 1537   Or  . acetaminophen (TYLENOL) suppository 650 mg  650 mg Rectal Q6H PRN Tiffany Manning   650 mg at 01/30/18 0033  . apixaban (ELIQUIS) tablet 2.5 mg  2.5 mg Oral BID Tiffany Manning, RPH   2.5 mg at 02/01/18 1213  . cefTRIAXone (ROCEPHIN) 1 g in sodium chloride 0.9 % 100 mL IVPB  1 g Intravenous Q24H Tiffany Reno, Manning 200 mL/hr at 02/01/18 1247 1 g at 02/01/18 1247  . chlorhexidine (PERIDEX) 0.12 % solution 15 mL  15 mL Mouth Rinse BID Tiffany Manning   15 mL at 01/31/18 0855  . dextrose 50 % solution           . [START ON 02/02/2018] famotidine (PEPCID) IVPB 20 mg premix  20 mg Intravenous Q24H Tiffany Siren, Manning      .  insulin aspart (novoLOG) injection 0-9 Units  0-9 Units Subcutaneous Q4H Tiffany Norrie, NP   1 Units at 01/31/18 0111  . MEDLINE mouth rinse  15 mL Mouth Rinse q12n4p Tiffany Manning   15 mL at 01/30/18 1312  . metoprolol tartrate (LOPRESSOR) tablet 75 mg  75 mg Oral BID Tiffany Manning   75 mg at 02/01/18 1212  . ondansetron (ZOFRAN) tablet 4 mg  4 mg Oral Q8H PRN Tiffany Reno, Manning   4 mg at 01/31/18 2238   Or  . ondansetron (ZOFRAN) injection 4 mg  4 mg Intravenous Q8H PRN Tiffany Reno, Manning   4 mg at 02/01/18 0903  . potassium chloride 10 mEq in 100 mL IVPB  10 mEq Intravenous Q1 Hr x 6 Sainani, Vivek Manning, Manning 100 mL/hr at 02/01/18 1425 10 mEq at 02/01/18 1425  . promethazine (PHENERGAN)  injection 12.5 mg  12.5 mg Intravenous Q6H PRN Tiffany Manning   12.5 mg at 02/01/18 0050  . simvastatin (ZOCOR) tablet 40 mg  40 mg Oral q1800 Tiffany Manning   40 mg at 01/31/18 1644  . sodium chloride flush (NS) 0.9 % injection 3 mL  3 mL Intravenous Q12H Tiffany Siren, Manning   3 mL at 01/31/18 2115  . traMADol (ULTRAM) tablet 50 mg  50 mg Oral Q6H PRN Tiffany Loll, Manning   50 mg at 02/01/18 0409     Discharge Medications: Please see discharge summary for a list of discharge medications.  Relevant Imaging Results:  Relevant Lab Results:   Additional Information (SSN: [id number])  Juleon Narang, Tiffany Crocker, LCSW

## 2018-02-01 NOTE — Progress Notes (Signed)
PT Cancellation Note  Patient Details Name: Tiffany Manning MRN: [id number] DOB: 10/24/1940   Cancelled Treatment:     Pt refused PT today stating that now was not a good time, that she has been nauseous all day, and "I do not need PT." PT explained the importance of the evaluation, for making sure she is safe with all her mobility, for future PT care, and insurance coverage. She refused. PT attempted 2 more times to explain that we would take all movements slow and if tolerable move to the EOB. Pt continued to refuse. PT will dc in house d/t lack of pt cooperation and refusal. Please re-order therapy if pt becomes agreeable.  Arvilla Meres, SPT    Arvilla Meres 02/01/2018, 2:04 PM

## 2018-02-01 NOTE — Clinical Social Work Note (Signed)
Clinical Social Work Assessment  Patient Details  Name: Tiffany Manning MRN: [id number] Date of Birth: Jun 14, 1940  Date of referral:  02/01/18               Reason for consult:  Facility Placement                Permission sought to share information with:    Permission granted to share information::     Name::        Agency::     Relationship::     Contact Information:     Housing/Transportation Living arrangements for the past 2 months:  Single Family Home Source of Information:  Patient, Spouse Patient Interpreter Needed:  None Criminal Activity/Legal Involvement Pertinent to Current Situation/Hospitalization:  No - Comment as needed Significant Relationships:  Spouse Lives with:  Spouse Do you feel safe going back to the place where you live?  Yes Need for family participation in patient care:  Yes (Comment)  Care giving concerns:  Patient lives in [place] with her husband Tiffany Manning.    Social Worker assessment / plan:  Holiday representative (Tiffany Manning) reviewed chart and noted that OT is recommending SNF. Patient refused to work with PT. CSW met with patient alone at bedside to discuss D/C plan. Patient was alert and oriented X3 and was sitting up in the bed. CSW introduced self and explained role of CSW department. Per patient she lives in [place] with her husband Tiffany Manning. Tiffany Manning still works and patient is alone most of the time. Per patient she walks with a cane at baseline and does not need assistance. CSW explained that OT is recommending SNF. Patient adamantly refused SNF and stated that she is going home. CSW offered home health PT. Per patient she does want anybody coming into her house. Per patient she plans on going home after she leaves [name]. CSW contacted patient's husband Tiffany Manning and made him aware of above. Tiffany Manning reported that he not surprised that patient refused SNF and PT. RN case manager aware of above. CSW will continue to follow and assist as needed.   Employment status:   Retired Nurse, adult PT Recommendations:  Not assessed at this time Information / Referral to community resources:  Other (Comment Required)(Patient refused SNF. )  Patient/Family's Response to care:  Patient refused SNF.   Patient/Family's Understanding of and Emotional Response to Diagnosis, Current Treatment, and Prognosis:  Patient was pleasant and thanked CSW for visit.   Emotional Assessment Appearance:  Appears stated age Attitude/Demeanor/Rapport:    Affect (typically observed):  Accepting, Adaptable, Pleasant Orientation:  Oriented to Self, Oriented to Place, Oriented to  Time, Oriented to Situation Alcohol / Substance use:  Not Applicable Psych involvement (Current and /or in the community):  No (Comment)  Discharge Needs  Concerns to be addressed:  Discharge Planning Concerns Readmission within the last 30 days:  No Current discharge risk:  Dependent with Mobility Barriers to Discharge:  Continued Medical Work up   [employer], Tiffany Beets, LCSW 02/01/2018, 2:43 PM

## 2018-02-01 NOTE — Progress Notes (Signed)
Famotidine renally adjusted to 20mg  daily

## 2018-02-01 NOTE — Progress Notes (Signed)
Feels very tired Poor appetite Did not participate in PT    OBJECTIVE: Patient Vitals for the past 24 hrs:  BP Temp Temp src Pulse Resp SpO2 Weight  02/01/18 0811 (!) 144/77 98.1 F (36.7 C) Oral 83 18 97 % -  02/01/18 0320 (!) 144/83 98 F (36.7 C) Oral 86 18 98 % 62.1 kg  01/31/18 2015 (!) 154/85 98 F (36.7 C) Oral 99 18 97 % -  01/31/18 1636 (!) 139/99 98.3 F (36.8 C) Oral 86 20 96 % -   Physical Exam Constitutional: tired and lethargic, pale CVS- s1s2  RS b/l air entry decreased bases GI- abd soft GU- foley in place MSK-moves all limbs SKIN- ulcer over the left leg laterally - superficial and does not look infected Neurologic-non focal Psychiatric-flat affect Lab Results CBC Latest Ref Rng & Units 02/01/2018 01/31/2018 01/30/2018  WBC 3.6 - 11.0 K/uL 8.6 9.1 11.8(H)  Hemoglobin 12.0 - 16.0 g/dL 8.1(L) 7.9(L) 8.2(L)  Hematocrit 35.0 - 47.0 % 24.4(L) 24.0(L) 25.4(L)  Platelets 150 - 440 K/uL 196 193 215    CMP Latest Ref Rng & Units 02/01/2018 01/31/2018 01/30/2018  Glucose 70 - 99 mg/dL 88 84 161(W)  BUN 8 - 23 mg/dL 96(E) 45(W) 09(W)  Creatinine 0.44 - 1.00 mg/dL 1.19(J) 4.78(G) 9.56(O)  Sodium 135 - 145 mmol/L 140 146(H) 147(H)  Potassium 3.5 - 5.1 mmol/L 2.9(L) 3.4(L) 3.9  Chloride 98 - 111 mmol/L 112(H) 118(H) 120(H)  CO2 22 - 32 mmol/L 20(L) 20(L) 20(L)  Calcium 8.9 - 10.3 mg/dL 7.2(L) 7.6(L) 7.8(L)  Total Protein 6.5 - 8.1 g/dL - - -  Total Bilirubin 0.3 - 1.2 mg/dL - - -  Alkaline Phos 38 - 126 U/L - - -  AST 15 - 41 U/L - - -  ALT 0 - 44 U/L - - -    Microbiology: 9/22 BC- 1 of 2 coag neg staph Urine culture- e.coli pan sensitive except ampicillin-  Radiographs and labs were personally reviewed by me.   Assessment and Plan 77 y.o. female with h/o DM, HTN, is admitted with severe weakness, poor appetite and near fall. As per her husband she has not been feeling well for more than 2 weeks. Has had no appetite, feeling very weak, passing dark  urine, went to her PCP and urine checked and was given antibiotics, yesterday slid to the floor when she tried to get up and husband could not pick her up and called 911. She has been having black vomit for the past 2 days. In the ED Bp 90/45, temp 96.5, HR 130. Cultures sent and started on vanco and cefepime. Was found to have metabolic acidosis, AKI and hyperglycemia and DKA. As per the hospitalist her PCP had mentioned it was e.coli  Pretty sensitive organism She was admitted to ICU. Ct abdomen and pelvis revealed B/l hydroureteronephrosis and she has been seen by urologist and is going for stent placement.  Severe urosepsis -complicated pyelonephritis has b/l hydroureteronephrosis- s/p stent - e.coli in urine pan sensitive- on ceftriaxone- will need until 02/11/18. Will not do PO ciprofloxacin ( because of high risk for side effects)   Coag neg staph in blood is likely a skin contaminant ( 2 different species homina and capitis-l DC vanco)  AKI on ckd- combination of dehydration, sepsis and obstruction- improving   DKA- being corrected  Hypokalemia  Being corrected Anemia  Upper GI bleed-? Stress related-   Non healing wound left leg x 4 yrs after a cat  scratch- may need biopsy?  Discussed the management with her nurse and hospitalist ID will not round on her this weekend- call if needed

## 2018-02-01 NOTE — Progress Notes (Signed)
Inpatient Diabetes Program Recommendations  AACE/ADA: New Consensus Statement on Inpatient Glycemic Control (2019)  Target Ranges:  Prepandial:   less than 140 mg/dL      Peak postprandial:   less than 180 mg/dL (1-2 hours)      Critically ill patients:  140 - 180 mg/dL  Results for LAKASHIA, COLLISON (MRN [id number]) as of 02/01/2018 07:34  Ref. Range 01/31/2018 07:43 01/31/2018 11:37 01/31/2018 16:38 01/31/2018 20:06 02/01/2018 00:02 02/01/2018 00:39 02/01/2018 03:53 02/01/2018 04:27  Glucose-Capillary Latest Ref Range: 70 - 99 mg/dL 71 119 (H) 85 92 72 74 60 (L) 99    Review of Glycemic Control  Diabetes history:DM2 Outpatient Diabetes medications:Metaglip 5-500 mgonce a day every other day Current orders for Inpatient glycemic control:Lantus 14 units QHS, Novolog 0-9 units Q4H  Inpatient Diabetes Program Recommendations: Insulin - Basal:Patient received Lantus 14 units last night at 21:07 and fasting glucose 60 mg/dl today. Please discontinue Lantus. Insulin-Correction: Please change CBGs and Novolog to 0-9 units TID with meals and Novolog 0-5 units QHS. Oral Agents: Please consider ordering Glipizide2.5mg  QPM.Would not start until this evening since patient received Lantus 14 units last night at bedtime. Would also recommend patient be discharged on Glipizide 2.5 mg daily and follow up with PCP. HgbA1C: A1C 7.5% on 9/25/19indicating an average glucose of 169 mg/dl.   Thanks, Orlando Penner, RN, MSN, CDE Diabetes Coordinator Inpatient Diabetes Program [phone] (Team Pager from 8am to 5pm)

## 2018-02-01 NOTE — Progress Notes (Addendum)
Progress Note  Patient Name: Tiffany Manning Date of Encounter: 02/01/2018  Primary Cardiologist: [facility]  Subjective   Patient reports she did not sleep well last night. She reportedly awoke in the middle of the night with hypoglycemia and felt horrible.   Reports very fatigued today d/t bad night's sleep.  Reports symptoms of pre-syncope when attempting to stand today and with ambulation, including darkening of peripheral vision, dizziness, and "foggy glass vision."   She reportedly remains nauseous, though not currently nauseous. She denies emesis today, though she does report an episode of dry heaving. She reportedly ate breakfast this morning but has not yet eaten her lunch. She reports she will eat lunch after I leave the room.  Atrial fibrillation with higher ventricular rates today, 90s-130s, despite yesterday's increase in Metoprolol to 75mg . Denies palpitations or racing heart rate. Denies SOB or CP. BP 144/77 with JVP at level of mandible.   No reported changes in urination or bowel movements associated drop in hemoglobin since admission of Hgb 11.7   8.6. Denies blood in urine or stool. Recommend continue to monitor and further workup per IM. Hypokalemic today with potassium 2.9 and receiving repletion with goal 4.0. Renal function improved significantly since admission with Cr 3.40  1.37.   Inpatient Medications    Scheduled Meds: . apixaban  2.5 mg Oral BID  . chlorhexidine  15 mL Mouth Rinse BID  . dextrose      . insulin aspart  0-9 Units Subcutaneous Q4H  . mouth rinse  15 mL Mouth Rinse q12n4p  . metoprolol tartrate  75 mg Oral BID  . simvastatin  40 mg Oral q1800  . sodium chloride flush  3 mL Intravenous Q12H   Continuous Infusions: . sodium chloride 75 mL/hr at 01/31/18 2111  . [START ON 02/02/2018] cefTRIAXone (ROCEPHIN)  IV    . [START ON 02/02/2018] famotidine (PEPCID) IV    . potassium chloride 10 mEq (02/01/18 1521)   PRN Meds: acetaminophen **OR**  acetaminophen, ondansetron **OR** ondansetron (ZOFRAN) IV, promethazine, traMADol   Vital Signs    Vitals:   01/31/18 1636 01/31/18 2015 02/01/18 0320 02/01/18 0811  BP: (!) 139/99 (!) 154/85 (!) 144/83 (!) 144/77  Pulse: 86 99 86 83  Resp: 20 18 18 18   Temp: 98.3 F (36.8 C) 98 F (36.7 C) 98 F (36.7 C) 98.1 F (36.7 C)  TempSrc: Oral Oral Oral Oral  SpO2: 96% 97% 98% 97%  Weight:   62.1 kg   Height:        Intake/Output Summary (Last 24 hours) at 02/01/2018 1545 Last data filed at 02/01/2018 1428 Gross per 24 hour  Intake 949.9 ml  Output 1525 ml  Net -575.1 ml   Filed Weights   01/27/18 0840 01/27/18 1122 02/01/18 0320  Weight: 54.4 kg 57.4 kg 62.1 kg    Telemetry    IRIR with HR in 90s-130s - Personally Reviewed  Physical Exam   GEN: Sleeping when entered room. Frail elderly woman. Lunch untouched in front of her.  Neck: CVP remains elevated to mandible Cardiac: IRIR, no murmurs, rubs, or gallops.  Respiratory: Reduced breath sounds at b/l lung bases.  GI: Soft, nontender, non-distended  MS: No edema; No deformity. Left leg remains bandaged d/t chronic wound.  Neuro:  Nonfocal  Psych: Normal affect   Labs    Chemistry Recent Labs  Lab 01/27/18 0845  01/29/18 0432 01/30/18 0218 01/31/18 0454 02/01/18 0447  NA 139   < > 146*  147* 146* 140  K 3.7   < > 4.7 3.9 3.4* 2.9*  CL 106   < > 117* 120* 118* 112*  CO2 8*   < > 17* 20* 20* 20*  GLUCOSE 328*   < > 256* 135* 84 88  BUN 96*   < > 59* 48* 38* 30*  CREATININE 3.40*   < > 2.31* 1.96* 1.78* 1.37*  CALCIUM 8.9   < > 7.6* 7.8* 7.6* 7.2*  PROT 7.9  --  5.9*  --   --   --   ALBUMIN 3.8  --  2.7*  --   --   --   AST 21  --  19  --   --   --   ALT 23  --  14  --   --   --   ALKPHOS 104  --  67  --   --   --   BILITOT 1.0  --  0.7  --   --   --   GFRNONAA 12*   < > 19* 23* 26* 36*  GFRAA 14*   < > 22* 27* 30* 42*  ANIONGAP 25*   < > 12 7 8 8    < > = values in this interval not displayed.      Hematology Recent Labs  Lab 01/30/18 0218 01/31/18 0454 02/01/18 0447  WBC 11.8* 9.1 8.6  RBC 2.71* 2.52* 2.57*  HGB 8.2* 7.9* 8.1*  HCT 25.4* 24.0* 24.4*  MCV 93.7 95.1 95.0  MCH 30.3 31.2 31.5  MCHC 32.4 32.9 33.1  RDW 15.9* 15.7* 15.7*  PLT 215 193 196    Cardiac EnzymesNo results for input(s): TROPONINI in the last 168 hours. No results for input(s): TROPIPOC in the last 168 hours.   BNPNo results for input(s): BNP, PROBNP in the last 168 hours.   DDimer No results for input(s): DDIMER in the last 168 hours.   Radiology    No results found.  Cardiac Studies   TTE 01/28/2018 - Left ventricle: The cavity size was normal. Wall thickness was increased in a pattern of mild LVH. Systolic function was normal. The estimated ejection fraction was in the range of 55% to 65%. - Aortic valve: Valve area (Vmax): 2.21 cm^2. - Mitral valve: There was mild regurgitation. - Right atrium: The atrium was mildly dilated. - Tricuspid valve: There was moderate regurgitation.   Patient Profile     77 y.o. female DM2, HLD, HTN who is being seen s/p bilateral ureteral stents by Dr. Annabell Howells w/ b/l nephrosis and possible bladder cancer suspected and acute on chronic kidney injury / chronic UTI for the evaluation of newly diagnosed Atrial Fibrillation with RVR (of unknown onset) and at the request of Dr. Sherryll Burger.  Assessment & Plan    Atrial Fibrillation with Rapid Ventricular Rate, unknown date of onset - No reported CP. No SOB. + fatigue, nausea with dry heaving, near-syncope with ambulation today. - 01/2018 Echo shows hyperdynamic LV contraction. RV appears dilated with mod-severe pulonary HTN: chronic lung disease v. PE. Future workup might include CT angio if Cr returns to baseline. - Afib with RVR of unknown date of onset -rates likely exacerbated by hypotension and and chronic kidney infection. CHA2DS2VASc score of at least  5 (HTN, age x2, DM, Female).  - TSH 1.949 (9/23).  Potassium 3.7  2.9 with goal 4.0 and IV started for repletion - Renal function improving 2.44  1.37 (baseline ~1.3).  - Hgb 11.7  8.6. Monitor given drop since admission. Recommend further workup per IM. - Gentle IVF d/t RV strain as noted in echo.  - Continue to monitor vitals, daily CBC, daily BMET to monitor electrolytes and renal function/ kidney injury.  - Continue Eliquis 2.5mg  BID and at discharge. If biopsy for squamous cell in future, will need to hold Eliquis 2-3 days prior to procedure and resume when safe per treating provider after procedure.  Continue metoprolol tartrate 75mg  and titrate as needed for further BP and HR control.  Acute on Chronic Renal Failure - Renal function improving with IVF.  - Hgb drop as above- patient denies hematuria, hematochezia. ?anemia of chronic disease. Given started anticoagulation with Eliquis 2.5 yesterday, recommend further workup per IM prior to discharge. - Caution with fluids in setting of right heart strain. Monitor I&O's / urinary output / weights.  - Daily BMP.  - Caution with nephrotoxic agents. Renal dosing for any renally cleared medications. Renal dose of Eliquis as above. - Metformin containing medications (Metaglip) held d/t renal function. Progressing patient diet. Caution with restarting d/t poor oral intake and resultant hypoglycemia. Renal function returning to baseline and once patient is able to eat, can consider transition to oral meds and prior to discharge if hypoglycemia resolved with oral intake increased.  - Recommend continue to hold diuretic and reassess restart tomorrow if renal function continues to remain at baseline and patient remains hypertensive. - Continue to monitor electrolytes - Per IM, nephrology  HTN  - BP 144/77. HR 130s-80s  -Continue metoprolol 75mg  BID and titrate as HR allows for further BP control as still hypertensive and HR elevated. Home medications include atenolol and maxide-25, currently held.  Will reassess diuretic tomorrow if renal function still at baseline and persistent HTN, elevated HR.  - Continue to monitor vitals  HLD - Continue statin as labs show normal liver function.  - Per IM  DM2 - Hyperglycemic - SSI - Continue to hold metformin given still progressing diet, hypoglycemic episode - Per IM  Remaining per IM   For questions or updates, please contact [facility] Please consult [email or web address] for contact info under        Signed, Lennon Alstrom, PA-C  02/01/2018, 3:45 PM

## 2018-02-02 ENCOUNTER — Inpatient Hospital Stay: Payer: Self-pay

## 2018-02-02 ENCOUNTER — Inpatient Hospital Stay: Payer: Medicare HMO

## 2018-02-02 LAB — GLUCOSE, CAPILLARY
GLUCOSE-CAPILLARY: 125 mg/dL — AB (ref 70–99)
GLUCOSE-CAPILLARY: 217 mg/dL — AB (ref 70–99)
GLUCOSE-CAPILLARY: 123 mg/dL — AB (ref 70–99)
Glucose-Capillary: 92 mg/dL (ref 70–99)
Glucose-Capillary: 82 mg/dL (ref 70–99)

## 2018-02-02 LAB — CBC
HCT: 24.4 % — ABNORMAL LOW (ref 35.0–47.0)
HEMOGLOBIN: 8.1 g/dL — AB (ref 12.0–16.0)
MCH: 31.6 pg (ref 26.0–34.0)
MCHC: 33 g/dL (ref 32.0–36.0)
MCV: 95.8 fL (ref 80.0–100.0)
Platelets: 196 10*3/uL (ref 150–440)
RBC: 2.55 MIL/uL — AB (ref 3.80–5.20)
RDW: 15.2 % — ABNORMAL HIGH (ref 11.5–14.5)
WBC: 9.6 10*3/uL (ref 3.6–11.0)

## 2018-02-02 LAB — BASIC METABOLIC PANEL
Anion gap: 7 (ref 5–15)
BUN: 23 mg/dL (ref 8–23)
CALCIUM: 7.2 mg/dL — AB (ref 8.9–10.3)
CO2: 20 mmol/L — ABNORMAL LOW (ref 22–32)
CREATININE: 1.5 mg/dL — AB (ref 0.44–1.00)
Chloride: 112 mmol/L — ABNORMAL HIGH (ref 98–111)
GFR calc Af Amer: 37 mL/min — ABNORMAL LOW (ref >60)
GFR, EST NON AFRICAN AMERICAN: 32 mL/min — AB (ref >60)
Glucose, Bld: 98 mg/dL (ref 70–99)
POTASSIUM: 4 mmol/L (ref 3.5–5.1)
SODIUM: 139 mmol/L (ref 135–145)

## 2018-02-02 MED ORDER — METOPROLOL TARTRATE 50 MG PO TABS
75.0000 mg | ORAL_TABLET | Freq: Two times a day (BID) | ORAL | Status: DC
  Administered 2018-02-02 – 2018-02-06 (×8): 75 mg via ORAL
  Filled 2018-02-02 (×8): qty 1

## 2018-02-02 MED ORDER — ATORVASTATIN CALCIUM 20 MG PO TABS
20.0000 mg | ORAL_TABLET | Freq: Every day | ORAL | Status: DC
  Administered 2018-02-04 – 2018-02-05 (×2): 20 mg via ORAL
  Filled 2018-02-02 (×2): qty 1

## 2018-02-02 MED ORDER — INSULIN GLARGINE 100 UNIT/ML ~~LOC~~ SOLN
6.0000 [IU] | Freq: Every day | SUBCUTANEOUS | Status: DC
  Administered 2018-02-02 – 2018-02-04 (×3): 6 [IU] via SUBCUTANEOUS
  Filled 2018-02-02 (×4): qty 0.06

## 2018-02-02 MED ORDER — INSULIN ASPART 100 UNIT/ML ~~LOC~~ SOLN
0.0000 [IU] | Freq: Every day | SUBCUTANEOUS | Status: DC
  Administered 2018-02-05: 2 [IU] via SUBCUTANEOUS
  Filled 2018-02-02: qty 1

## 2018-02-02 MED ORDER — INSULIN ASPART 100 UNIT/ML ~~LOC~~ SOLN
0.0000 [IU] | Freq: Three times a day (TID) | SUBCUTANEOUS | Status: DC
  Administered 2018-02-02: 3 [IU] via SUBCUTANEOUS
  Administered 2018-02-02: 1 [IU] via SUBCUTANEOUS
  Administered 2018-02-03 – 2018-02-04 (×2): 2 [IU] via SUBCUTANEOUS
  Administered 2018-02-04: 3 [IU] via SUBCUTANEOUS
  Administered 2018-02-05: 1 [IU] via SUBCUTANEOUS
  Administered 2018-02-06: 7 [IU] via SUBCUTANEOUS
  Filled 2018-02-02 (×6): qty 1

## 2018-02-02 MED ORDER — METOPROLOL TARTRATE 50 MG PO TABS: 100.0000 mg | ORAL_TABLET | Freq: Two times a day (BID) | ORAL | Status: DC

## 2018-02-02 NOTE — Progress Notes (Signed)
Patient needs Antibiotics for another 10 days OK to have PICC line

## 2018-02-02 NOTE — Progress Notes (Signed)
Spoke with Soumoun RN re PICC order.  RN states pt is not planned for d/c home this weekend.  Notified Soumoun RN that if pt to be d/c home this weekend to notify [facility].  If pt to go home during the week, PICC team will place once approved by Nephrology MD.

## 2018-02-02 NOTE — Progress Notes (Signed)
Patient ID: Tiffany Manning, female   DOB: July 25, 1940, 77 y.o.   MRN: [id number]  [facility] PROGRESS NOTE  Tiffany Manning DOB: [birth year] DOA: 01/27/2018 PCP: Jaclyn Shaggy, MD  HPI/Subjective: Patient feeling a little bit better today.  Ate a little bit.  Last couple days has not walked with physical therapy yet.  Patient wants to go home and does not want to go to the rehab.  Objective: Vitals:   02/02/18 0425 02/02/18 0728  BP: 119/68 131/69  Pulse: 65 72  Resp: 18 20  Temp: 98 F (36.7 C) (!) 97.5 F (36.4 C)  SpO2: 99% 100%    Filed Weights   01/27/18 0840 01/27/18 1122 02/01/18 0320  Weight: 54.4 kg 57.4 kg 62.1 kg    ROS: Review of Systems  Constitutional: Negative for chills and fever.  Eyes: Negative for blurred vision.  Respiratory: Negative for cough and shortness of breath.   Cardiovascular: Negative for chest pain.  Gastrointestinal: Negative for abdominal pain, constipation, diarrhea, nausea and vomiting.  Genitourinary: Negative for dysuria.  Musculoskeletal: Negative for joint pain.  Neurological: Negative for dizziness and headaches.   Exam: Physical Exam  Constitutional: She is oriented to person, place, and time.  HENT:  Nose: No mucosal edema.  Mouth/Throat: No oropharyngeal exudate or posterior oropharyngeal edema.  Eyes: Pupils are equal, round, and reactive to light. Conjunctivae, EOM and lids are normal.  Neck: No JVD present. Carotid bruit is not present. No edema present. No thyroid mass and no thyromegaly present.  Cardiovascular: S1 normal and S2 normal. Exam reveals no gallop.  No murmur heard. Pulses:      Dorsalis pedis pulses are 2+ on the right side, and 2+ on the left side.  Respiratory: No respiratory distress. She has no wheezes. She has no rhonchi. She has no rales.  GI: Soft. Bowel sounds are normal. There is no tenderness.  Musculoskeletal:       Right ankle: She exhibits no swelling.       Left ankle: She  exhibits no swelling.  Lymphadenopathy:    She has no cervical adenopathy.  Neurological: She is alert and oriented to person, place, and time. No cranial nerve deficit.  Skin: Skin is warm. No rash noted. Nails show no clubbing.  Psychiatric: She has a normal mood and affect.      Data Reviewed: Basic Metabolic Panel: Recent Labs  Lab 01/27/18 1905  01/28/18 0347 01/28/18 1313  01/29/18 0432 01/30/18 0218 01/31/18 0454 02/01/18 0447 02/02/18 0549  NA 140   < > 148* 143   < > 146* 147* 146* 140 139  K 3.2*   < > 3.2* 4.3   < > 4.7 3.9 3.4* 2.9* 4.0  CL 117*   < > 114* 115*   < > 117* 120* 118* 112* 112*  CO2 <7*   < > 20* 15*   < > 17* 20* 20* 20* 20*  GLUCOSE 203*   < > 159* 317*   < > 256* 135* 84 88 98  BUN 83*   < > 71* 72*   < > 59* 48* 38* 30* 23  CREATININE 2.60*   < > 2.44* 2.62*   < > 2.31* 1.96* 1.78* 1.37* 1.50*  CALCIUM 8.0*   < > 8.0* 7.6*   < > 7.6* 7.8* 7.6* 7.2* 7.2*  MG 2.1  --  2.0 1.8  --  1.6* 2.0  --   --   --   PHOS  4.7*  --  1.6*  --   --  2.2* 2.4*  --   --   --    < > = values in this interval not displayed.   Liver Function Tests: Recent Labs  Lab 01/27/18 0845 01/29/18 0432  AST 21 19  ALT 23 14  ALKPHOS 104 67  BILITOT 1.0 0.7  PROT 7.9 5.9*  ALBUMIN 3.8 2.7*   Recent Labs  Lab 01/27/18 0845 01/29/18 0432  LIPASE 148* 32   CBC: Recent Labs  Lab 01/27/18 0845  01/29/18 0432 01/30/18 0218 01/31/18 0454 02/01/18 0447 02/02/18 0549  WBC 22.3*   < > 12.0* 11.8* 9.1 8.6 9.6  NEUTROABS 19.5*  --  9.7*  --   --   --   --   HGB 11.7*   < > 8.8* 8.2* 7.9* 8.1* 8.1*  HCT 37.2   < > 27.1* 25.4* 24.0* 24.4* 24.4*  MCV 97.1   < > 93.3 93.7 95.1 95.0 95.8  PLT 491*   < > 271 215 193 196 196   < > = values in this interval not displayed.   Cardiac Enzymes: Recent Labs  Lab 01/27/18 1004 01/29/18 0432  CKTOTAL 49 175    CBG: Recent Labs  Lab 02/01/18 1958 02/01/18 2356 02/02/18 0427 02/02/18 0731 02/02/18 1121  GLUCAP 98  114* 92 82 125*    Recent Results (from the past 240 hour(s))  Blood Culture (routine x 2)     Status: Abnormal   Collection Time: 01/27/18  8:40 AM  Result Value Ref Range Status   Specimen Description   Final    BLOOD RIGHT ANTECUBITAL Performed at [facility], [address] [place], [place] [address]    Special Requests   Final    BOTTLES DRAWN AEROBIC AND ANAEROBIC Blood Culture adequate volume Performed at [facility], [address] [place], [place] [address]    Culture  Setup Time   Final    AEROBIC BOTTLE ONLY GRAM POSITIVE COCCI CRITICAL RESULT CALLED TO, READ BACK BY AND VERIFIED WITH: MATT [facility] 01/28/18 0618 REC Performed at [facility], [address], [place], [place] [address]    Culture STAPHYLOCOCCUS HOMINIS (A)  Final   Report Status 01/30/2018 FINAL  Final   Organism ID, Bacteria STAPHYLOCOCCUS HOMINIS  Final      Susceptibility   Staphylococcus hominis - MIC*    CIPROFLOXACIN >=8 RESISTANT Resistant     ERYTHROMYCIN <=0.25 SENSITIVE Sensitive     GENTAMICIN <=0.5 SENSITIVE Sensitive     OXACILLIN >=4 RESISTANT Resistant     TETRACYCLINE >=16 RESISTANT Resistant     VANCOMYCIN <=0.5 SENSITIVE Sensitive     TRIMETH/SULFA >=320 RESISTANT Resistant     CLINDAMYCIN <=0.25 SENSITIVE Sensitive     RIFAMPIN <=0.5 SENSITIVE Sensitive     Inducible Clindamycin NEGATIVE Sensitive     * STAPHYLOCOCCUS HOMINIS  Blood Culture ID Panel (Reflexed)     Status: Abnormal   Collection Time: 01/27/18  8:40 AM  Result Value Ref Range Status   Enterococcus species NOT DETECTED NOT DETECTED Final   Listeria monocytogenes NOT DETECTED NOT DETECTED Final   Staphylococcus species DETECTED (A) NOT DETECTED Final    Comment: Methicillin (oxacillin) resistant coagulase negative staphylococcus. Possible blood culture contaminant (unless isolated from more than one blood culture draw or clinical case suggests pathogenicity). No antibiotic treatment is  indicated for blood  culture contaminants. CRITICAL RESULT CALLED TO, READ BACK BY AND  VERIFIED WITH: MATT [facility] 01/28/18 0618 REC    Staphylococcus aureus NOT DETECTED NOT DETECTED Final   Methicillin resistance DETECTED (A) NOT DETECTED Final    Comment: CRITICAL RESULT CALLED TO, READ BACK BY AND VERIFIED WITH: MATT MCBANE 01/28/18 0618 REC    Streptococcus species NOT DETECTED NOT DETECTED Final   Streptococcus agalactiae NOT DETECTED NOT DETECTED Final   Streptococcus pneumoniae NOT DETECTED NOT DETECTED Final   Streptococcus pyogenes NOT DETECTED NOT DETECTED Final   Acinetobacter baumannii NOT DETECTED NOT DETECTED Final   Enterobacteriaceae species NOT DETECTED NOT DETECTED Final   Enterobacter cloacae complex NOT DETECTED NOT DETECTED Final   Escherichia coli NOT DETECTED NOT DETECTED Final   Klebsiella oxytoca NOT DETECTED NOT DETECTED Final   Klebsiella pneumoniae NOT DETECTED NOT DETECTED Final   Proteus species NOT DETECTED NOT DETECTED Final   Serratia marcescens NOT DETECTED NOT DETECTED Final   Haemophilus influenzae NOT DETECTED NOT DETECTED Final   Neisseria meningitidis NOT DETECTED NOT DETECTED Final   Pseudomonas aeruginosa NOT DETECTED NOT DETECTED Final   Candida albicans NOT DETECTED NOT DETECTED Final   Candida glabrata NOT DETECTED NOT DETECTED Final   Candida krusei NOT DETECTED NOT DETECTED Final   Candida parapsilosis NOT DETECTED NOT DETECTED Final   Candida tropicalis NOT DETECTED NOT DETECTED Final    Comment: Performed at [facility], [address] [place], [place] [address]  Blood Culture (routine x 2)     Status: Abnormal   Collection Time: 01/27/18  8:46 AM  Result Value Ref Range Status   Specimen Description   Final    BLOOD LEFT ANTECUBITAL Performed at [facility], [address], [place], [place] [address]    Special Requests   Final    BOTTLES DRAWN AEROBIC AND ANAEROBIC Blood Culture adequate volume Performed at  [facility], [address] [place], [place] [address]    Culture  Setup Time   Final    GRAM POSITIVE COCCI AEROBIC BOTTLE ONLY CRITICAL VALUE NOTED.  VALUE IS CONSISTENT WITH PREVIOUSLY REPORTED AND CALLED VALUE. Performed at [facility], [address] [place], [place] [address]    Culture (A)  Final    STAPHYLOCOCCUS CAPITIS THE SIGNIFICANCE OF ISOLATING THIS ORGANISM FROM A SINGLE SET OF BLOOD CULTURES WHEN MULTIPLE SETS ARE DRAWN IS UNCERTAIN. PLEASE NOTIFY THE MICROBIOLOGY DEPARTMENT WITHIN ONE WEEK IF SPECIATION AND SENSITIVITIES ARE REQUIRED. Performed at [facility], [address] [place], [place] [address]    Report Status 01/30/2018 FINAL  Final  Urine Culture     Status: Abnormal   Collection Time: 01/27/18  9:45 AM  Result Value Ref Range Status   Specimen Description   Final    URINE, CATHETERIZED Performed at [facility], [address] [place], [place] [address]    Special Requests   Final    NONE Performed at [facility], [address] [place], [place] [address]    Culture >=100,000 COLONIES/mL ESCHERICHIA COLI (A)  Final   Report Status 01/30/2018 FINAL  Final   Organism ID, Bacteria ESCHERICHIA COLI (A)  Final      Susceptibility   Escherichia coli - MIC*    AMPICILLIN 16 INTERMEDIATE Intermediate     CEFAZOLIN <=4 SENSITIVE Sensitive     CEFTRIAXONE <=1 SENSITIVE Sensitive     CIPROFLOXACIN <=0.25 SENSITIVE Sensitive     GENTAMICIN <=1 SENSITIVE Sensitive     IMIPENEM <=0.25 SENSITIVE Sensitive  NITROFURANTOIN 32 SENSITIVE Sensitive     TRIMETH/SULFA <=20 SENSITIVE Sensitive     AMPICILLIN/SULBACTAM 8 SENSITIVE Sensitive     PIP/TAZO <=4 SENSITIVE Sensitive     Extended ESBL NEGATIVE Sensitive     * >=100,000 COLONIES/mL ESCHERICHIA COLI  MRSA PCR Screening     Status: None   Collection Time: 01/27/18 11:25 AM  Result Value Ref Range Status   MRSA by PCR NEGATIVE NEGATIVE Final    Comment:         The GeneXpert MRSA Assay (FDA approved for NASAL specimens only), is one component of a comprehensive MRSA colonization surveillance program. It is not intended to diagnose MRSA infection nor to guide or monitor treatment for MRSA infections. Performed at [facility], [address] [place], [place] [address]   C difficile quick scan w PCR reflex     Status: None   Collection Time: 01/28/18  1:14 PM  Result Value Ref Range Status   C Diff antigen NEGATIVE NEGATIVE Final   C Diff toxin NEGATIVE NEGATIVE Final   C Diff interpretation No C. difficile detected.  Final    Comment: Performed at [facility], [address] [place], [place] [address]  Urine Culture     Status: None   Collection Time: 01/28/18  6:31 PM  Result Value Ref Range Status   Specimen Description   Final    URINE, RANDOM Performed at [facility], [address], [place], [place] [address]    Special Requests RIGHT RENAL PELVIS  Final   Culture   Final    NO GROWTH Performed at [facility], [address], [place], [place] [address]    Report Status 01/30/2018 FINAL  Final  Urine Culture     Status: Abnormal   Collection Time: 01/28/18  6:33 PM  Result Value Ref Range Status   Specimen Description   Final    URINE, RANDOM Performed at [facility], [address], [place], [place] [address]    Special Requests   Final    LEFT KIDNEY Performed at [facility], [address] [place], [place] [address]    Culture 10,000 COLONIES/mL ESCHERICHIA COLI (A)  Final   Report Status 01/31/2018 FINAL  Final   Organism ID, Bacteria ESCHERICHIA COLI (A)  Final      Susceptibility   Escherichia coli - MIC*    AMPICILLIN <=2 SENSITIVE Sensitive     CEFAZOLIN <=4 SENSITIVE Sensitive     CEFTRIAXONE <=1 SENSITIVE Sensitive     CIPROFLOXACIN <=0.25 SENSITIVE Sensitive     GENTAMICIN 2 SENSITIVE Sensitive     IMIPENEM <=0.25 SENSITIVE Sensitive      NITROFURANTOIN 64 INTERMEDIATE Intermediate     TRIMETH/SULFA <=20 SENSITIVE Sensitive     AMPICILLIN/SULBACTAM <=2 SENSITIVE Sensitive     PIP/TAZO <=4 SENSITIVE Sensitive     Extended ESBL NEGATIVE Sensitive     * 10,000 COLONIES/mL ESCHERICHIA COLI  CULTURE, BLOOD (ROUTINE X 2) w Reflex to ID Panel     Status: None (Preliminary result)   Collection Time: 01/29/18  9:32 AM  Result Value Ref Range Status   Specimen Description BLOOD RIGH HAND  Final   Special Requests   Final    BOTTLES DRAWN AEROBIC AND ANAEROBIC Blood Culture adequate volume   Culture   Final    NO GROWTH 4 DAYS Performed at [facility], [address] [place], [place] [address]    Report  Status PENDING  Incomplete  CULTURE, BLOOD (ROUTINE X 2) w Reflex to ID Panel     Status: None (Preliminary result)   Collection Time: 01/29/18  9:43 AM  Result Value Ref Range Status   Specimen Description BLOOD LEFT HAND  Final   Special Requests   Final    BOTTLES DRAWN AEROBIC AND ANAEROBIC Blood Culture adequate volume   Culture   Final    NO GROWTH 4 DAYS Performed at [facility], [address] [place], [place] [address]    Report Status PENDING  Incomplete     Studies: [place] Ekg Site Rite  Result Date: 02/02/2018 If Site Rite image not attached, placement could not be confirmed due to current cardiac rhythm.   Scheduled Meds: . apixaban  2.5 mg Oral BID  . atorvastatin  20 mg Oral q1800  . chlorhexidine  15 mL Mouth Rinse BID  . diltiazem  60 mg Oral Q12H  . insulin aspart  0-5 Units Subcutaneous QHS  . insulin aspart  0-9 Units Subcutaneous TID WC  . mouth rinse  15 mL Mouth Rinse q12n4p  . metoprolol tartrate  75 mg Oral BID  . sodium chloride flush  3 mL Intravenous Q12H   Continuous Infusions: . sodium chloride 75 mL/hr at 02/02/18 0741  . cefTRIAXone (ROCEPHIN)  IV 2 g (02/02/18 0922)  . famotidine (PEPCID) IV 20 mg (02/02/18 1302)    Assessment/Plan:  1. Severe sepsis  secondary to UTI.  E. coli sepsis.  Continue Rocephin.  Infectious disease wrote antibiotics to be continued through 02/11/2018.  PICC line ordered.  Urology brought to the OR for cystoscopy and stent insertion for bilateral hydronephrosis. 2. Diabetic ketoacidosis has improved.  Placed on low-dose Lantus and sliding scale. 3. Acute kidney injury secondary to severe sepsis and infection.  Creatinine has improved. 4. Atrial fibrillation with rapid ventricular response.  On Eliquis.  On metoprolol for rate control. 5. Leukocytosis secondary to sepsis 6. Hypertension on metoprolol 7. Hyperlipidemia on simvastatin  Code Status:     Code Status Orders  (From admission, onward)         Start     Ordered   01/27/18 1123  Full code  Continuous     01/27/18 1122        Code Status History    Date Active Date Inactive Code Status Order ID Comments User Context   05/28/2017 1637 06/06/2017 2002 Full Code [id number]  Altamese Dilling, MD ED     Disposition Plan: Patient would like to go home but I need her to walk around  Consultants:  Infectious disease  Nephrology  Neurology   Antibiotics:  IV Rocephin  Time spent: 28 minutes  Jullien Granquist [employer]

## 2018-02-02 NOTE — Progress Notes (Signed)
Progress Note  Patient Name: Tiffany Manning Date of Encounter: 02/02/2018  Primary Cardiologist: New - Consult by Ashleynicole Mcclees  Subjective   Patient feels weak but otherwise without complaints.  No chest pain, shortness of breath, or palpitations.  Inpatient Medications    Scheduled Meds: . apixaban  2.5 mg Oral BID  . atorvastatin  20 mg Oral q1800  . chlorhexidine  15 mL Mouth Rinse BID  . diltiazem  60 mg Oral Q12H  . insulin aspart  0-5 Units Subcutaneous QHS  . insulin aspart  0-9 Units Subcutaneous TID WC  . insulin glargine  6 Units Subcutaneous QHS  . mouth rinse  15 mL Mouth Rinse q12n4p  . metoprolol tartrate  75 mg Oral BID  . sodium chloride flush  3 mL Intravenous Q12H   Continuous Infusions: . sodium chloride 75 mL/hr at 02/02/18 0741  . cefTRIAXone (ROCEPHIN)  IV 2 g (02/02/18 0922)  . famotidine (PEPCID) IV 20 mg (02/02/18 1302)   PRN Meds: acetaminophen **OR** acetaminophen, ondansetron **OR** ondansetron (ZOFRAN) IV, promethazine, traMADol   Vital Signs    Vitals:   02/01/18 1956 02/02/18 0425 02/02/18 0728 02/02/18 1600  BP: 130/83 119/68 131/69 106/67  Pulse: (!) 117 65 72 70  Resp: 18 18 20 20   Temp: 98.9 F (37.2 C) 98 F (36.7 C) (!) 97.5 F (36.4 C) 97.8 F (36.6 C)  TempSrc: Oral Oral Oral Oral  SpO2: 100% 99% 100% 100%  Weight:      Height:        Intake/Output Summary (Last 24 hours) at 02/02/2018 1734 Last data filed at 02/02/2018 1037 Gross per 24 hour  Intake 646.2 ml  Output 750 ml  Net -103.8 ml   Filed Weights   01/27/18 0840 01/27/18 1122 02/01/18 0320  Weight: 54.4 kg 57.4 kg 62.1 kg    Telemetry    A-fib with gradually improved heart rate (ventricular response 60-100 over last 18 hours) - Personally Reviewed  ECG    No new tracing.  Physical Exam   GEN: No acute distress.   Neck: No JVD Cardiac: Irregularly irregular.  No murmurs. Respiratory: Mildly diminished breath sounds at both bases. GI: Soft, nontender,  non-distended  MS: No edema; No deformity. Neuro:  Nonfocal  Psych: Normal affect   Labs    Chemistry Recent Labs  Lab 01/27/18 0845  01/29/18 0432  01/31/18 0454 02/01/18 0447 02/02/18 0549  NA 139   < > 146*   < > 146* 140 139  K 3.7   < > 4.7   < > 3.4* 2.9* 4.0  CL 106   < > 117*   < > 118* 112* 112*  CO2 8*   < > 17*   < > 20* 20* 20*  GLUCOSE 328*   < > 256*   < > 84 88 98  BUN 96*   < > 59*   < > 38* 30* 23  CREATININE 3.40*   < > 2.31*   < > 1.78* 1.37* 1.50*  CALCIUM 8.9   < > 7.6*   < > 7.6* 7.2* 7.2*  PROT 7.9  --  5.9*  --   --   --   --   ALBUMIN 3.8  --  2.7*  --   --   --   --   AST 21  --  19  --   --   --   --   ALT 23  --  14  --   --   --   --  ALKPHOS 104  --  67  --   --   --   --   BILITOT 1.0  --  0.7  --   --   --   --   GFRNONAA 12*   < > 19*   < > 26* 36* 32*  GFRAA 14*   < > 22*   < > 30* 42* 37*  ANIONGAP 25*   < > 12   < > 8 8 7    < > = values in this interval not displayed.     Hematology Recent Labs  Lab 01/31/18 0454 02/01/18 0447 02/02/18 0549  WBC 9.1 8.6 9.6  RBC 2.52* 2.57* 2.55*  HGB 7.9* 8.1* 8.1*  HCT 24.0* 24.4* 24.4*  MCV 95.1 95.0 95.8  MCH 31.2 31.5 31.6  MCHC 32.9 33.1 33.0  RDW 15.7* 15.7* 15.2*  PLT 193 196 196    Cardiac EnzymesNo results for input(s): TROPONINI in the last 168 hours. No results for input(s): TROPIPOC in the last 168 hours.   BNPNo results for input(s): BNP, PROBNP in the last 168 hours.   DDimer No results for input(s): DDIMER in the last 168 hours.   Radiology    [place] Ekg Site Rite  Result Date: 02/02/2018 If [facility] image not attached, placement could not be confirmed due to current cardiac rhythm.   Cardiac Studies   Echo (01/29/18): - Left ventricle: The cavity size was normal. Wall thickness was   increased in a pattern of mild LVH. Systolic function was normal.   The estimated ejection fraction was in the range of 55% to 65%. - Aortic valve: Valve area (Vmax): 2.21 cm^2. -  Mitral valve: There was mild regurgitation. - Right atrium: The atrium was mildly dilated. - Tricuspid valve: There was moderate regurgitation.  Patient Profile     77 y.o. female with DM2, HLD, HTNwho is being seen s/p bilateral ureteral stents by Dr. Annabell Howells w/ b/l nephrosis and possible bladder cancer suspected and acute on chronic kidney injury / chronic UTI for the evaluation ofnewly diagnosedAtrial Fibrillationwith RVR (of unknown onset) andat the request of Dr. Sherryll Burger.  Assessment & Plan    Atrial fibrillation Ventricular rate adequately controlled with addition of diltiazem.  Continue current doses of metoprolol and diltiazem.  Continue apixaban.  Based on age, weight, and renal function, she should be on 5 mg BID.  However, given fluctuating renal function and weight (cut-off creatinine 1.5 and wt 60 kg), I will defer making change today.  Severe sepsis Improving.  Continue antibiotics and supportive care per IM and ID.    For questions or updates, please contact [facility] Please consult [email or web address] for contact info under [facility].     Signed, Yvonne Kendall, MD  02/02/2018, 5:34 PM

## 2018-02-02 NOTE — Progress Notes (Signed)
[facility] called for picc placement .

## 2018-02-02 NOTE — Plan of Care (Signed)

## 2018-02-02 NOTE — Progress Notes (Signed)
PHARMACY - PHYSICIAN COMMUNICATION  Assessment: Patients on diltiazem and simvastatin >10 mg/day have reported cases of rhabdomyolysis.  Patient is ordered simvastatin 40mg  daily and Diltiazems SR 60mg  BID.  Plan:  Pharmacy to assess simvastatin dose. If >10 mg, substitute atorvastatin (Lipitor) 1mg  for each 2mg  simvastatin and leave communication form advising MD of change.  Pharmacy has discontinued the order for simvastatin 40mg  daily and ordered atorvastatin 20mg  daily per protocol.   Gardner Candle, PharmD, BCPS Clinical Pharmacist 02/02/2018 8:42 AM

## 2018-02-03 LAB — CULTURE, BLOOD (ROUTINE X 2)
CULTURE: NO GROWTH
CULTURE: NO GROWTH
SPECIAL REQUESTS: ADEQUATE
SPECIAL REQUESTS: ADEQUATE

## 2018-02-03 LAB — BASIC METABOLIC PANEL
ANION GAP: 8 (ref 5–15)
BUN: 19 mg/dL (ref 8–23)
CO2: 20 mmol/L — ABNORMAL LOW (ref 22–32)
Calcium: 6.9 mg/dL — ABNORMAL LOW (ref 8.9–10.3)
Chloride: 108 mmol/L (ref 98–111)
Creatinine, Ser: 1.31 mg/dL — ABNORMAL HIGH (ref 0.44–1.00)
GFR calc Af Amer: 44 mL/min — ABNORMAL LOW (ref >60)
GFR, EST NON AFRICAN AMERICAN: 38 mL/min — AB (ref >60)
GLUCOSE: 185 mg/dL — AB (ref 70–99)
POTASSIUM: 3.8 mmol/L (ref 3.5–5.1)
Sodium: 136 mmol/L (ref 135–145)

## 2018-02-03 LAB — GLUCOSE, CAPILLARY
GLUCOSE-CAPILLARY: 114 mg/dL — AB (ref 70–99)
GLUCOSE-CAPILLARY: 151 mg/dL — AB (ref 70–99)
Glucose-Capillary: 121 mg/dL — ABNORMAL HIGH (ref 70–99)
Glucose-Capillary: 108 mg/dL — ABNORMAL HIGH (ref 70–99)
Glucose-Capillary: 120 mg/dL — ABNORMAL HIGH (ref 70–99)
Glucose-Capillary: 153 mg/dL — ABNORMAL HIGH (ref 70–99)
Glucose-Capillary: 88 mg/dL (ref 70–99)

## 2018-02-03 MED ORDER — SODIUM CHLORIDE 0.9% FLUSH
10.0000 mL | Freq: Two times a day (BID) | INTRAVENOUS | Status: DC
  Administered 2018-02-03 – 2018-02-06 (×5): 10 mL

## 2018-02-03 MED ORDER — SODIUM CHLORIDE 0.9% FLUSH
10.0000 mL | Freq: Two times a day (BID) | INTRAVENOUS | Status: DC
  Administered 2018-02-03 – 2018-02-05 (×3): 10 mL via INTRAVENOUS

## 2018-02-03 MED ORDER — FAMOTIDINE 20 MG PO TABS
20.0000 mg | ORAL_TABLET | Freq: Every day | ORAL | Status: DC
  Administered 2018-02-03 – 2018-02-06 (×4): 20 mg via ORAL
  Filled 2018-02-03 (×4): qty 1

## 2018-02-03 NOTE — Plan of Care (Signed)
  Problem: Education: Goal: Knowledge of General Education information will improve Description Including pain rating scale, medication(s)/side effects and non-pharmacologic comfort measures Outcome: Progressing   Problem: Health Behavior/Discharge Planning: Goal: Ability to manage health-related needs will improve Outcome: Progressing   Problem: Pain Managment: Goal: General experience of comfort will improve Outcome: Progressing   Problem: Safety: Goal: Ability to remain free from injury will improve Outcome: Progressing   Problem: Clinical Measurements: Goal: Signs and symptoms of infection will decrease Outcome: Progressing

## 2018-02-03 NOTE — Progress Notes (Signed)
Patient is not able to walk.  RN sat and discussed rehab and the patient's fears of rehab.  Patient is worried she will be abandoned in a facility.    Patient will think about it but is not sure about rehab at this time.  She wants to go home, but is unable to walk from the bed to the bathroom inside her room.  At home her family works and is not home most of the day; she would be home alone.  Christean Grief, RN

## 2018-02-03 NOTE — Progress Notes (Signed)
Patient to discharge on IV abx. Orders in place for IV rocephin at home. Placed referral with Jermaine from [facility]. Patient to discharge tomorrow after AM rocephin infusion. Tentative start of care date is 10/1 at 11am for antibiotic administration. RNCM will continue to follow to ensure everything is set up at time of discharge.

## 2018-02-03 NOTE — Progress Notes (Signed)
[facility], [place] 02/03/18  Subjective:   Patient feels fair this morning.  Trying to eat a little bit more today.  Appetite remains suboptimal Urine output 1050 cc.    Objective:  Vital signs in last 24 hours:  Temp:  [97.8 F (36.6 C)-98.5 F (36.9 C)] 98.5 F (36.9 C) (09/29 0814) Pulse Rate:  [57-70] 61 (09/29 0814) Resp:  [18-20] 19 (09/29 0814) BP: (106-129)/(55-74) 129/74 (09/29 0814) SpO2:  [99 %-100 %] 100 % (09/29 0814)  Weight change:  Filed Weights   01/27/18 0840 01/27/18 1122 02/01/18 0320  Weight: 54.4 kg 57.4 kg 62.1 kg    Intake/Output:    Intake/Output Summary (Last 24 hours) at 02/03/2018 1354 Last data filed at 02/03/2018 1000 Gross per 24 hour  Intake 1390.6 ml  Output 800 ml  Net 590.6 ml     Physical Exam: General: Ill appearing  HEENT moist oral mucus membranes  Neck supple  Pulm/lungs Decreased breath sounds at bases  CVS/Heart Tachycardic, irregular  Abdomen:  Soft, NT  Extremities: trace edema  Neurologic: Alert, follows few simple commands  Skin: warm   Foley in place       Basic Metabolic Panel:  Recent Labs  Lab 01/27/18 1905  01/28/18 0839 01/28/18 1313  01/29/18 0432 01/30/18 0218 01/31/18 0454 02/01/18 0447 02/02/18 0549 02/03/18 0956  NA 140   < > 148* 143   < > 146* 147* 146* 140 139 136  K 3.2*   < > 3.2* 4.3   < > 4.7 3.9 3.4* 2.9* 4.0 3.8  CL 117*   < > 114* 115*   < > 117* 120* 118* 112* 112* 108  CO2 <7*   < > 20* 15*   < > 17* 20* 20* 20* 20* 20*  GLUCOSE 203*   < > 159* 317*   < > 256* 135* 84 88 98 185*  BUN 83*   < > 71* 72*   < > 59* 48* 38* 30* 23 19  CREATININE 2.60*   < > 2.44* 2.62*   < > 2.31* 1.96* 1.78* 1.37* 1.50* 1.31*  CALCIUM 8.0*   < > 8.0* 7.6*   < > 7.6* 7.8* 7.6* 7.2* 7.2* 6.9*  MG 2.1  --  2.0 1.8  --  1.6* 2.0  --   --   --   --   PHOS 4.7*  --  1.6*  --   --  2.2* 2.4*  --   --   --   --    < > = values in this interval not displayed.      CBC: Recent Labs  Lab 01/29/18 0432 01/30/18 0218 01/31/18 0454 02/01/18 0447 02/02/18 0549  WBC 12.0* 11.8* 9.1 8.6 9.6  NEUTROABS 9.7*  --   --   --   --   HGB 8.8* 8.2* 7.9* 8.1* 8.1*  HCT 27.1* 25.4* 24.0* 24.4* 24.4*  MCV 93.3 93.7 95.1 95.0 95.8  PLT 271 215 193 196 196     No results found for: HEPBSAG, HEPBSAB, HEPBIGM    Microbiology:  Recent Results (from the past 240 hour(s))  Blood Culture (routine x 2)     Status: Abnormal   Collection Time: 01/27/18  8:40 AM  Result Value Ref Range Status   Specimen Description   Final    BLOOD RIGHT ANTECUBITAL Performed at [facility], [address], [place], [place] [address]    Special Requests   Final  BOTTLES DRAWN AEROBIC AND ANAEROBIC Blood Culture adequate volume Performed at [facility], [address] [place], [place] [address]    Culture  Setup Time   Final    AEROBIC BOTTLE ONLY GRAM POSITIVE COCCI CRITICAL RESULT CALLED TO, READ BACK BY AND VERIFIED WITH: MATT [facility] 01/28/18 0618 REC Performed at [facility], [address], [place], [place] [address]    Culture STAPHYLOCOCCUS HOMINIS (A)  Final   Report Status 01/30/2018 FINAL  Final   Organism ID, Bacteria STAPHYLOCOCCUS HOMINIS  Final      Susceptibility   Staphylococcus hominis - MIC*    CIPROFLOXACIN >=8 RESISTANT Resistant     ERYTHROMYCIN <=0.25 SENSITIVE Sensitive     GENTAMICIN <=0.5 SENSITIVE Sensitive     OXACILLIN >=4 RESISTANT Resistant     TETRACYCLINE >=16 RESISTANT Resistant     VANCOMYCIN <=0.5 SENSITIVE Sensitive     TRIMETH/SULFA >=320 RESISTANT Resistant     CLINDAMYCIN <=0.25 SENSITIVE Sensitive     RIFAMPIN <=0.5 SENSITIVE Sensitive     Inducible Clindamycin NEGATIVE Sensitive     * STAPHYLOCOCCUS HOMINIS  Blood Culture ID Panel (Reflexed)     Status: Abnormal   Collection Time: 01/27/18  8:40 AM  Result Value Ref Range Status   Enterococcus species NOT DETECTED NOT DETECTED Final    Listeria monocytogenes NOT DETECTED NOT DETECTED Final   Staphylococcus species DETECTED (A) NOT DETECTED Final    Comment: Methicillin (oxacillin) resistant coagulase negative staphylococcus. Possible blood culture contaminant (unless isolated from more than one blood culture draw or clinical case suggests pathogenicity). No antibiotic treatment is indicated for blood  culture contaminants. CRITICAL RESULT CALLED TO, READ BACK BY AND VERIFIED WITH: MATT [facility] 01/28/18 0618 REC    Staphylococcus aureus NOT DETECTED NOT DETECTED Final   Methicillin resistance DETECTED (A) NOT DETECTED Final    Comment: CRITICAL RESULT CALLED TO, READ BACK BY AND VERIFIED WITH: MATT [facility] 01/28/18 0618 REC    Streptococcus species NOT DETECTED NOT DETECTED Final   Streptococcus agalactiae NOT DETECTED NOT DETECTED Final   Streptococcus pneumoniae NOT DETECTED NOT DETECTED Final   Streptococcus pyogenes NOT DETECTED NOT DETECTED Final   Acinetobacter baumannii NOT DETECTED NOT DETECTED Final   Enterobacteriaceae species NOT DETECTED NOT DETECTED Final   Enterobacter cloacae complex NOT DETECTED NOT DETECTED Final   Escherichia coli NOT DETECTED NOT DETECTED Final   Klebsiella oxytoca NOT DETECTED NOT DETECTED Final   Klebsiella pneumoniae NOT DETECTED NOT DETECTED Final   Proteus species NOT DETECTED NOT DETECTED Final   Serratia marcescens NOT DETECTED NOT DETECTED Final   Haemophilus influenzae NOT DETECTED NOT DETECTED Final   Neisseria meningitidis NOT DETECTED NOT DETECTED Final   Pseudomonas aeruginosa NOT DETECTED NOT DETECTED Final   Candida albicans NOT DETECTED NOT DETECTED Final   Candida glabrata NOT DETECTED NOT DETECTED Final   Candida krusei NOT DETECTED NOT DETECTED Final   Candida parapsilosis NOT DETECTED NOT DETECTED Final   Candida tropicalis NOT DETECTED NOT DETECTED Final    Comment: Performed at [facility], [address] [place], [place] [address]  Blood Culture  (routine x 2)     Status: Abnormal   Collection Time: 01/27/18  8:46 AM  Result Value Ref Range Status   Specimen Description   Final    BLOOD LEFT ANTECUBITAL Performed at [facility], [address], [place], [place] [address]    Special Requests   Final    BOTTLES DRAWN  AEROBIC AND ANAEROBIC Blood Culture adequate volume Performed at [facility], [address] [place], [place] [address]    Culture  Setup Time   Final    GRAM POSITIVE COCCI AEROBIC BOTTLE ONLY CRITICAL VALUE NOTED.  VALUE IS CONSISTENT WITH PREVIOUSLY REPORTED AND CALLED VALUE. Performed at [facility], [address] [place], [place] [address]    Culture (A)  Final    STAPHYLOCOCCUS CAPITIS THE SIGNIFICANCE OF ISOLATING THIS ORGANISM FROM A SINGLE SET OF BLOOD CULTURES WHEN MULTIPLE SETS ARE DRAWN IS UNCERTAIN. PLEASE NOTIFY THE MICROBIOLOGY DEPARTMENT WITHIN ONE WEEK IF SPECIATION AND SENSITIVITIES ARE REQUIRED. Performed at [facility], [address], [place], [place] [address]    Report Status 01/30/2018 FINAL  Final  Urine Culture     Status: Abnormal   Collection Time: 01/27/18  9:45 AM  Result Value Ref Range Status   Specimen Description   Final    URINE, CATHETERIZED Performed at [facility], [address] [place], [place] [address]    Special Requests   Final    NONE Performed at [facility], [address] [place], [place] [address]    Culture >=100,000 COLONIES/mL ESCHERICHIA COLI (A)  Final   Report Status 01/30/2018 FINAL  Final   Organism ID, Bacteria ESCHERICHIA COLI (A)  Final      Susceptibility   Escherichia coli - MIC*    AMPICILLIN 16 INTERMEDIATE Intermediate     CEFAZOLIN <=4 SENSITIVE Sensitive     CEFTRIAXONE <=1 SENSITIVE Sensitive     CIPROFLOXACIN <=0.25 SENSITIVE Sensitive     GENTAMICIN <=1 SENSITIVE Sensitive     IMIPENEM <=0.25 SENSITIVE Sensitive     NITROFURANTOIN 32 SENSITIVE Sensitive      TRIMETH/SULFA <=20 SENSITIVE Sensitive     AMPICILLIN/SULBACTAM 8 SENSITIVE Sensitive     PIP/TAZO <=4 SENSITIVE Sensitive     Extended ESBL NEGATIVE Sensitive     * >=100,000 COLONIES/mL ESCHERICHIA COLI  MRSA PCR Screening     Status: None   Collection Time: 01/27/18 11:25 AM  Result Value Ref Range Status   MRSA by PCR NEGATIVE NEGATIVE Final    Comment:        The GeneXpert MRSA Assay (FDA approved for NASAL specimens only), is one component of a comprehensive MRSA colonization surveillance program. It is not intended to diagnose MRSA infection nor to guide or monitor treatment for MRSA infections. Performed at [facility], [address] [place], [place] [address]   C difficile quick scan w PCR reflex     Status: None   Collection Time: 01/28/18  1:14 PM  Result Value Ref Range Status   C Diff antigen NEGATIVE NEGATIVE Final   C Diff toxin NEGATIVE NEGATIVE Final   C Diff interpretation No C. difficile detected.  Final    Comment: Performed at [facility], [address] [place], [place] [address]  Urine Culture     Status: None   Collection Time: 01/28/18  6:31 PM  Result Value Ref Range Status   Specimen Description   Final    URINE, RANDOM Performed at [facility], [address], [place], [place] [address]    Special Requests RIGHT RENAL PELVIS  Final   Culture   Final    NO GROWTH Performed at [facility], [address] [place], [place] [address]    Report Status 01/30/2018 FINAL  Final  Urine Culture  Status: Abnormal   Collection Time: 01/28/18  6:33 PM  Result Value Ref Range Status   Specimen Description   Final    URINE, RANDOM Performed at [facility], [address] [place], [place] [address]    Special Requests   Final    LEFT KIDNEY Performed at [facility], [address], [place], [place] [address]    Culture 10,000 COLONIES/mL ESCHERICHIA COLI (A)  Final   Report Status  01/31/2018 FINAL  Final   Organism ID, Bacteria ESCHERICHIA COLI (A)  Final      Susceptibility   Escherichia coli - MIC*    AMPICILLIN <=2 SENSITIVE Sensitive     CEFAZOLIN <=4 SENSITIVE Sensitive     CEFTRIAXONE <=1 SENSITIVE Sensitive     CIPROFLOXACIN <=0.25 SENSITIVE Sensitive     GENTAMICIN 2 SENSITIVE Sensitive     IMIPENEM <=0.25 SENSITIVE Sensitive     NITROFURANTOIN 64 INTERMEDIATE Intermediate     TRIMETH/SULFA <=20 SENSITIVE Sensitive     AMPICILLIN/SULBACTAM <=2 SENSITIVE Sensitive     PIP/TAZO <=4 SENSITIVE Sensitive     Extended ESBL NEGATIVE Sensitive     * 10,000 COLONIES/mL ESCHERICHIA COLI  CULTURE, BLOOD (ROUTINE X 2) w Reflex to ID Panel     Status: None   Collection Time: 01/29/18  9:32 AM  Result Value Ref Range Status   Specimen Description BLOOD RIGH HAND  Final   Special Requests   Final    BOTTLES DRAWN AEROBIC AND ANAEROBIC Blood Culture adequate volume   Culture   Final    NO GROWTH 5 DAYS Performed at [facility], [address] [place], [place] [address]    Report Status 02/03/2018 FINAL  Final  CULTURE, BLOOD (ROUTINE X 2) w Reflex to ID Panel     Status: None   Collection Time: 01/29/18  9:43 AM  Result Value Ref Range Status   Specimen Description BLOOD LEFT HAND  Final   Special Requests   Final    BOTTLES DRAWN AEROBIC AND ANAEROBIC Blood Culture adequate volume   Culture   Final    NO GROWTH 5 DAYS Performed at [facility], [address] [place], [place] [address]    Report Status 02/03/2018 FINAL  Final    Coagulation Studies: No results for input(s): LABPROT, INR in the last 72 hours.  Urinalysis: No results for input(s): COLORURINE, LABSPEC, PHURINE, GLUCOSEU, HGBUR, BILIRUBINUR, KETONESUR, PROTEINUR, UROBILINOGEN, NITRITE, LEUKOCYTESUR in the last 72 hours.  Invalid input(s): APPERANCEUR    Imaging: Dg Chest 1 View  Result Date: 02/02/2018 CLINICAL DATA:  PICC line placement EXAM: CHEST  1 VIEW  COMPARISON:  Chest x-rays from January 27, 2018 and January 29, 2018 FINDINGS: The nodularity in the left upper lobe seen on a CT scan of the neck in January of 2019 remains. The right PICC line terminates in the central SVC. No pneumothorax. Mild atelectasis in the left base. Cardiomegaly. The hila and mediastinum are stable. No other acute abnormalities. Blunting of left costophrenic angle could represent a tiny effusion versus thickening. IMPRESSION: 1. The nodule worrisome for malignancy in the left upper lobe seen on the CT scan from January of 2019 is again identified. 2. The PICC line is in good position. 3. Probable small effusion with underlying atelectasis in the left base. Electronically Signed   By: Gerome Sam III M.D   On: 02/02/2018 19:53   [place] Ekg Site Rite  Result Date: 02/02/2018 If [facility]  image not attached, placement could not be confirmed due to current cardiac rhythm.    Medications:   . sodium chloride 75 mL/hr at 02/02/18 2340  . cefTRIAXone (ROCEPHIN)  IV 2 g (02/03/18 1122)   . apixaban  2.5 mg Oral BID  . atorvastatin  20 mg Oral q1800  . chlorhexidine  15 mL Mouth Rinse BID  . diltiazem  60 mg Oral Q12H  . famotidine  20 mg Oral Daily  . insulin aspart  0-5 Units Subcutaneous QHS  . insulin aspart  0-9 Units Subcutaneous TID WC  . insulin glargine  6 Units Subcutaneous QHS  . mouth rinse  15 mL Mouth Rinse q12n4p  . metoprolol tartrate  75 mg Oral BID  . sodium chloride flush  10 mL Intracatheter Q12H  . sodium chloride flush  10 mL Intravenous Q12H   acetaminophen **OR** acetaminophen, ondansetron **OR** ondansetron (ZOFRAN) IV, promethazine, traMADol  Assessment/ Plan:  77 y.o. caucasian female with medical problems of Diabetes, HTN, HLD, salivary gland stones who was admitted to [facility] on 01/27/2018 for evaluation of generalized fatigue and is Dx with severe sepsis, UTI and ARF.  1. ARF, multifactorial 2. B/l hydronephrosis- b/l ureteral stent  placed 9/23 by Dr Annabell Howells 3. Sepsis, Gram positive bacteremia  9/22, negative cultures 9/24 4. Acidosis, Hypernatremia, Hypokalemia 5. UTI- E Coli 6. CKD st 3, Baseline Cr 1.38/ GFR 36 from jan 2019 7. DM-2 with CKD 8. New onset A Fib 9. Enlarging LUL nodule 10. 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 Patient is s/p b/l ureteral stent placement.  S Creatinine and urine output continue to improve.  Serum creatinine is now at baseline. Iv ceftriaxone for urinary tract infection.  Keeping the Foley in because of small contracted bladder, possibly causing hydronephrosis and multiple UTIs.  Outpatient urology follow-up is planned.  Disposition- rehab versus home with home health but patient not strong enough to be at home yet. avoid nephrotoxins, hypotension   LOS: 7 Tiffany Manning Thedore Mins 9/29/20191:54 [facility] [place], [place] [phone]  Note: This note was prepared with Dragon dictation. Any transcription errors are unintentional

## 2018-02-03 NOTE — Progress Notes (Signed)
Patient ID: Tiffany Manning, female   DOB: [birth year], 77 y.o.   MRN: [id number]  [employer] PROGRESS NOTE  Tiffany Manning WUJ:[id number] DOB: 06-21-1940 DOA: 01/27/2018 PCP: Tiffany Shaggy, MD  HPI/Subjective: Patient feeling that she wants to go home rather than rehab.  Still very weak.  Otherwise feeling okay.  Objective: Vitals:   02/03/18 0603 02/03/18 0814  BP:  129/74  Pulse: 67 61  Resp:  19  Temp:  98.5 F (36.9 C)  SpO2:  100%    Filed Weights   01/27/18 0840 01/27/18 1122 02/01/18 0320  Weight: 54.4 kg 57.4 kg 62.1 kg    ROS: Review of Systems  Constitutional: Positive for malaise/fatigue. Negative for chills and fever.  Eyes: Negative for blurred vision.  Respiratory: Negative for cough and shortness of breath.   Cardiovascular: Negative for chest pain.  Gastrointestinal: Negative for abdominal pain, constipation, diarrhea, nausea and vomiting.  Genitourinary: Negative for dysuria.  Musculoskeletal: Negative for joint pain.  Neurological: Negative for dizziness and headaches.   Exam: Physical Exam  Constitutional: She is oriented to person, place, and time.  HENT:  Nose: No mucosal edema.  Mouth/Throat: No oropharyngeal exudate or posterior oropharyngeal edema.  Eyes: Pupils are equal, round, and reactive to light. Conjunctivae, EOM and lids are normal.  Neck: No JVD present. Carotid bruit is not present. No edema present. No thyroid mass and no thyromegaly present.  Cardiovascular: S1 normal and S2 normal. Exam reveals no gallop.  No murmur heard. Pulses:      Dorsalis pedis pulses are 2+ on the right side, and 2+ on the left side.  Respiratory: No respiratory distress. She has no wheezes. She has no rhonchi. She has no rales.  GI: Soft. Bowel sounds are normal. There is no tenderness.  Musculoskeletal:       Right ankle: She exhibits no swelling.       Left ankle: She exhibits no swelling.  Lymphadenopathy:    She has no cervical adenopathy.   Neurological: She is alert and oriented to person, place, and time. No cranial nerve deficit.  Skin: Skin is warm. No rash noted. Nails show no clubbing.  Psychiatric: She has a normal mood and affect.      Data Reviewed: Basic Metabolic Panel: Recent Labs  Lab 01/27/18 1905  01/28/18 9562 01/28/18 1313  01/29/18 1308 01/30/18 6578 01/31/18 0454 02/01/18 0447 02/02/18 0549 02/03/18 0956  NA 140   < > 148* 143   < > 146* 147* 146* 140 139 136  K 3.2*   < > 3.2* 4.3   < > 4.7 3.9 3.4* 2.9* 4.0 3.8  CL 117*   < > 114* 115*   < > 117* 120* 118* 112* 112* 108  CO2 <7*   < > 20* 15*   < > 17* 20* 20* 20* 20* 20*  GLUCOSE 203*   < > 159* 317*   < > 256* 135* 84 88 98 185*  BUN 83*   < > 71* 72*   < > 59* 48* 38* 30* 23 19  CREATININE 2.60*   < > 2.44* 2.62*   < > 2.31* 1.96* 1.78* 1.37* 1.50* 1.31*  CALCIUM 8.0*   < > 8.0* 7.6*   < > 7.6* 7.8* 7.6* 7.2* 7.2* 6.9*  MG 2.1  --  2.0 1.8  --  1.6* 2.0  --   --   --   --   PHOS 4.7*  --  1.6*  --   --  2.2* 2.4*  --   --   --   --    < > = values in this interval not displayed.   Liver Function Tests: Recent Labs  Lab 01/29/18 0432  AST 19  ALT 14  ALKPHOS 67  BILITOT 0.7  PROT 5.9*  ALBUMIN 2.7*   Recent Labs  Lab 01/29/18 0432  LIPASE 32   CBC: Recent Labs  Lab 01/29/18 0432 01/30/18 0218 01/31/18 0454 02/01/18 0447 02/02/18 0549  WBC 12.0* 11.8* 9.1 8.6 9.6  NEUTROABS 9.7*  --   --   --   --   HGB 8.8* 8.2* 7.9* 8.1* 8.1*  HCT 27.1* 25.4* 24.0* 24.4* 24.4*  MCV 93.3 93.7 95.1 95.0 95.8  PLT 271 215 193 196 196   Cardiac Enzymes: Recent Labs  Lab 01/29/18 0432  CKTOTAL 175    CBG: Recent Labs  Lab 02/03/18 0252 02/03/18 0433 02/03/18 0624 02/03/18 0815 02/03/18 1137  GLUCAP 121* 108* 120* 114* 153*    Recent Results (from the past 240 hour(s))  Blood Culture (routine x 2)     Status: Abnormal   Collection Time: 01/27/18  8:40 AM  Result Value Ref Range Status   Specimen Description   Final     BLOOD RIGHT ANTECUBITAL Performed at [facility], [address] [place], [place] [address]    Special Requests   Final    BOTTLES DRAWN AEROBIC AND ANAEROBIC Blood Culture adequate volume Performed at [facility], [address] [place], [place] [address]    Culture  Setup Time   Final    AEROBIC BOTTLE ONLY GRAM POSITIVE COCCI CRITICAL RESULT CALLED TO, READ BACK BY AND VERIFIED WITH: MATT [facility] 01/28/18 0618 REC Performed at [facility], [address], [place], [place] [address]    Culture STAPHYLOCOCCUS HOMINIS (A)  Final   Report Status 01/30/2018 FINAL  Final   Organism ID, Bacteria STAPHYLOCOCCUS HOMINIS  Final      Susceptibility   Staphylococcus hominis - MIC*    CIPROFLOXACIN >=8 RESISTANT Resistant     ERYTHROMYCIN <=0.25 SENSITIVE Sensitive     GENTAMICIN <=0.5 SENSITIVE Sensitive     OXACILLIN >=4 RESISTANT Resistant     TETRACYCLINE >=16 RESISTANT Resistant     VANCOMYCIN <=0.5 SENSITIVE Sensitive     TRIMETH/SULFA >=320 RESISTANT Resistant     CLINDAMYCIN <=0.25 SENSITIVE Sensitive     RIFAMPIN <=0.5 SENSITIVE Sensitive     Inducible Clindamycin NEGATIVE Sensitive     * STAPHYLOCOCCUS HOMINIS  Blood Culture ID Panel (Reflexed)     Status: Abnormal   Collection Time: 01/27/18  8:40 AM  Result Value Ref Range Status   Enterococcus species NOT DETECTED NOT DETECTED Final   Listeria monocytogenes NOT DETECTED NOT DETECTED Final   Staphylococcus species DETECTED (A) NOT DETECTED Final    Comment: Methicillin (oxacillin) resistant coagulase negative staphylococcus. Possible blood culture contaminant (unless isolated from more than one blood culture draw or clinical case suggests pathogenicity). No antibiotic treatment is indicated for blood  culture contaminants. CRITICAL RESULT CALLED TO, READ BACK BY AND VERIFIED WITH: MATT [facility] 01/28/18 0618 REC    Staphylococcus aureus NOT DETECTED NOT DETECTED Final   Methicillin resistance  DETECTED (A) NOT DETECTED Final    Comment: CRITICAL RESULT CALLED TO, READ BACK BY AND VERIFIED WITH: MATT [facility] 01/28/18 0618 REC    Streptococcus species NOT DETECTED NOT DETECTED Final   Streptococcus agalactiae NOT DETECTED NOT DETECTED Final  Streptococcus pneumoniae NOT DETECTED NOT DETECTED Final   Streptococcus pyogenes NOT DETECTED NOT DETECTED Final   Acinetobacter baumannii NOT DETECTED NOT DETECTED Final   Enterobacteriaceae species NOT DETECTED NOT DETECTED Final   Enterobacter cloacae complex NOT DETECTED NOT DETECTED Final   Escherichia coli NOT DETECTED NOT DETECTED Final   Klebsiella oxytoca NOT DETECTED NOT DETECTED Final   Klebsiella pneumoniae NOT DETECTED NOT DETECTED Final   Proteus species NOT DETECTED NOT DETECTED Final   Serratia marcescens NOT DETECTED NOT DETECTED Final   Haemophilus influenzae NOT DETECTED NOT DETECTED Final   Neisseria meningitidis NOT DETECTED NOT DETECTED Final   Pseudomonas aeruginosa NOT DETECTED NOT DETECTED Final   Candida albicans NOT DETECTED NOT DETECTED Final   Candida glabrata NOT DETECTED NOT DETECTED Final   Candida krusei NOT DETECTED NOT DETECTED Final   Candida parapsilosis NOT DETECTED NOT DETECTED Final   Candida tropicalis NOT DETECTED NOT DETECTED Final    Comment: Performed at [facility], [address] [place], [place] [address]  Blood Culture (routine x 2)     Status: Abnormal   Collection Time: 01/27/18  8:46 AM  Result Value Ref Range Status   Specimen Description   Final    BLOOD LEFT ANTECUBITAL Performed at [facility], [address] [place], [place] [address]    Special Requests   Final    BOTTLES DRAWN AEROBIC AND ANAEROBIC Blood Culture adequate volume Performed at [facility], [address] [place], [place] [address]    Culture  Setup Time   Final    GRAM POSITIVE COCCI AEROBIC BOTTLE ONLY CRITICAL VALUE NOTED.  VALUE IS CONSISTENT WITH PREVIOUSLY REPORTED AND  CALLED VALUE. Performed at [facility], [address] [place], [place] [address]    Culture (A)  Final    STAPHYLOCOCCUS CAPITIS THE SIGNIFICANCE OF ISOLATING THIS ORGANISM FROM A SINGLE SET OF BLOOD CULTURES WHEN MULTIPLE SETS ARE DRAWN IS UNCERTAIN. PLEASE NOTIFY THE MICROBIOLOGY DEPARTMENT WITHIN ONE WEEK IF SPECIATION AND SENSITIVITIES ARE REQUIRED. Performed at [facility], [address] [place], [place] [address]    Report Status 01/30/2018 FINAL  Final  Urine Culture     Status: Abnormal   Collection Time: 01/27/18  9:45 AM  Result Value Ref Range Status   Specimen Description   Final    URINE, CATHETERIZED Performed at [facility], [address] [place], [place] [address]    Special Requests   Final    NONE Performed at [facility], [address] [place], [place] [address]    Culture >=100,000 COLONIES/mL ESCHERICHIA COLI (A)  Final   Report Status 01/30/2018 FINAL  Final   Organism ID, Bacteria ESCHERICHIA COLI (A)  Final      Susceptibility   Escherichia coli - MIC*    AMPICILLIN 16 INTERMEDIATE Intermediate     CEFAZOLIN <=4 SENSITIVE Sensitive     CEFTRIAXONE <=1 SENSITIVE Sensitive     CIPROFLOXACIN <=0.25 SENSITIVE Sensitive     GENTAMICIN <=1 SENSITIVE Sensitive     IMIPENEM <=0.25 SENSITIVE Sensitive     NITROFURANTOIN 32 SENSITIVE Sensitive     TRIMETH/SULFA <=20 SENSITIVE Sensitive     AMPICILLIN/SULBACTAM 8 SENSITIVE Sensitive     PIP/TAZO <=4 SENSITIVE Sensitive     Extended ESBL NEGATIVE Sensitive     * >=100,000 COLONIES/mL ESCHERICHIA COLI  MRSA PCR Screening     Status: None   Collection Time: 01/27/18 11:25 AM  Result  Value Ref Range Status   MRSA by PCR NEGATIVE NEGATIVE Final    Comment:        The GeneXpert MRSA Assay (FDA approved for NASAL specimens only), is one component of a comprehensive MRSA colonization surveillance program. It is not intended to diagnose MRSA infection nor to guide  or monitor treatment for MRSA infections. Performed at [facility], [address] [place], [place] [address]   C difficile quick scan w PCR reflex     Status: None   Collection Time: 01/28/18  1:14 PM  Result Value Ref Range Status   C Diff antigen NEGATIVE NEGATIVE Final   C Diff toxin NEGATIVE NEGATIVE Final   C Diff interpretation No C. difficile detected.  Final    Comment: Performed at [facility], [address], [place], [place] [address]  Urine Culture     Status: None   Collection Time: 01/28/18  6:31 PM  Result Value Ref Range Status   Specimen Description   Final    URINE, RANDOM Performed at [facility], [address], [place], [place] [address]    Special Requests RIGHT RENAL PELVIS  Final   Culture   Final    NO GROWTH Performed at [facility], [address], [place], [place] [address]    Report Status 01/30/2018 FINAL  Final  Urine Culture     Status: Abnormal   Collection Time: 01/28/18  6:33 PM  Result Value Ref Range Status   Specimen Description   Final    URINE, RANDOM Performed at [facility], [address], [place], [place] [address]    Special Requests   Final    LEFT KIDNEY Performed at [facility], [address], [place], [place] [address]    Culture 10,000 COLONIES/mL ESCHERICHIA COLI (A)  Final   Report Status 01/31/2018 FINAL  Final   Organism ID, Bacteria ESCHERICHIA COLI (A)  Final      Susceptibility   Escherichia coli - MIC*    AMPICILLIN <=2 SENSITIVE Sensitive     CEFAZOLIN <=4 SENSITIVE Sensitive     CEFTRIAXONE <=1 SENSITIVE Sensitive     CIPROFLOXACIN <=0.25 SENSITIVE Sensitive     GENTAMICIN 2 SENSITIVE Sensitive     IMIPENEM <=0.25 SENSITIVE Sensitive     NITROFURANTOIN 64 INTERMEDIATE Intermediate     TRIMETH/SULFA <=20 SENSITIVE Sensitive     AMPICILLIN/SULBACTAM <=2 SENSITIVE Sensitive     PIP/TAZO <=4 SENSITIVE Sensitive     Extended ESBL NEGATIVE Sensitive      * 10,000 COLONIES/mL ESCHERICHIA COLI  CULTURE, BLOOD (ROUTINE X 2) w Reflex to ID Panel     Status: None   Collection Time: 01/29/18  9:32 AM  Result Value Ref Range Status   Specimen Description BLOOD RIGH HAND  Final   Special Requests   Final    BOTTLES DRAWN AEROBIC AND ANAEROBIC Blood Culture adequate volume   Culture   Final    NO GROWTH 5 DAYS Performed at [facility], [address] [place], [place] [address]    Report Status 02/03/2018 FINAL  Final  CULTURE, BLOOD (ROUTINE X 2) w Reflex to ID Panel     Status: None   Collection Time: 01/29/18  9:43 AM  Result Value Ref Range Status   Specimen Description BLOOD LEFT HAND  Final   Special Requests   Final    BOTTLES DRAWN AEROBIC AND ANAEROBIC Blood Culture adequate volume  Culture   Final    NO GROWTH 5 DAYS Performed at [facility], [address] [place], [place] [address]    Report Status 02/03/2018 FINAL  Final     Studies: Dg Chest 1 View  Result Date: 02/02/2018 CLINICAL DATA:  PICC line placement EXAM: CHEST  1 VIEW COMPARISON:  Chest x-rays from January 27, 2018 and January 29, 2018 FINDINGS: The nodularity in the left upper lobe seen on a CT scan of the neck in January of 2019 remains. The right PICC line terminates in the central SVC. No pneumothorax. Mild atelectasis in the left base. Cardiomegaly. The hila and mediastinum are stable. No other acute abnormalities. Blunting of left costophrenic angle could represent a tiny effusion versus thickening. IMPRESSION: 1. The nodule worrisome for malignancy in the left upper lobe seen on the CT scan from January of 2019 is again identified. 2. The PICC line is in good position. 3. Probable small effusion with underlying atelectasis in the left base. Electronically Signed   By: Gerome Sam III M.D   On: 02/02/2018 19:53   [place] Ekg Site Rite  Result Date: 02/02/2018 If Site Rite image not attached, placement could not be confirmed due to  current cardiac rhythm.   Scheduled Meds: . apixaban  2.5 mg Oral BID  . atorvastatin  20 mg Oral q1800  . chlorhexidine  15 mL Mouth Rinse BID  . diltiazem  60 mg Oral Q12H  . famotidine  20 mg Oral Daily  . insulin aspart  0-5 Units Subcutaneous QHS  . insulin aspart  0-9 Units Subcutaneous TID WC  . insulin glargine  6 Units Subcutaneous QHS  . mouth rinse  15 mL Mouth Rinse q12n4p  . metoprolol tartrate  75 mg Oral BID  . sodium chloride flush  10 mL Intracatheter Q12H  . sodium chloride flush  10 mL Intravenous Q12H   Continuous Infusions: . sodium chloride 75 mL/hr at 02/03/18 1501  . cefTRIAXone (ROCEPHIN)  IV 2 g (02/03/18 1122)    Assessment/Plan:  1. Severe sepsis secondary to UTI.  E. coli sepsis.  Continue Rocephin.  Infectious disease wrote antibiotics to be continued through 02/11/2018.  PICC line placed.  Urology brought to the OR for cystoscopy and stent insertion for bilateral hydronephrosis. 2. Diabetic ketoacidosis has improved.  Placed on low-dose Lantus and sliding scale. 3. Acute kidney injury secondary to severe sepsis and infection.  Creatinine has improved.  Patient has underlying chronic kidney disease stage III. 4. Atrial fibrillation with rapid ventricular response.  On Eliquis.  On metoprolol and Cardizem for rate control 5. Leukocytosis has improved 6. Hypertension on metoprolol 7. Hyperlipidemia on simvastatin  Code Status:     Code Status Orders  (From admission, onward)         Start     Ordered   01/27/18 1123  Full code  Continuous     01/27/18 1122        Code Status History    Date Active Date Inactive Code Status Order ID Comments User Context   05/28/2017 1637 06/06/2017 2002 Full Code [id number]  Altamese Dilling, MD ED     Disposition Plan: Patient would like to go home, physical therapy still recommending rehab.  Consultants:  Infectious disease  Nephrology  Neurology   Antibiotics:  IV Rocephin  Time spent:  27 minutes Spoke with husband at the bedside.  Vencil Basnett [employer]

## 2018-02-03 NOTE — Progress Notes (Signed)
RN who took care of the patient a few days ago noticed her urine is "a lot darker today" in color than it was Thursday & Friday.  Nephrology and the hospitalist both saw the urine today; RN thought it might be important to note. The patient has not produced much urine this shift.  Christean Grief, RN

## 2018-02-03 NOTE — Progress Notes (Signed)
   Progress Note  Patient Name: Tiffany Manning Date of Encounter: 02/03/2018  Primary Cardiologist: New - consult by Gwyn Hieronymus  Telemetry reviewed.  Patient remains in a-fib with excellent ventricular rate control.  Recommend continuation of current doses of metoprolol and diltiazem.  Creatinine should be rechecked.  Unless significant rise, would recommend increasing apixaban to 5 mg daily for therapeutic anticoagulation, given age < 80, wt > 60 kg.  [facility] will sign off.   Medication Recommendations:  See above. Other recommendations (labs, testing, etc):  BMP today. Follow up as an outpatient:  1-2 weeks after discharge with me or APP.  For questions or updates, please contact [facility] Please consult [email or web address] for contact info under   Signed, Yvonne Kendall, MD  02/03/2018, 9:08 AM

## 2018-02-03 NOTE — Evaluation (Signed)
Physical Therapy Evaluation Patient Details Name: Tiffany Manning MRN: [id number] DOB: [birth year] Today's Date: 02/03/2018   History of Present Illness  77 yo female was admitted with UTI and AKI along with sepsis after being at home with increased weakness for the last week.  Has been hypotensive, tachycardic and had leukocytosis.  Has been referred to PT for evaluation of mobility but had not been able to be seen since early last week.  Clinical Impression  Pt was seen for evaluation of mobility but has been in bed for many days.  Pt was dizzy upon standing, but noted O2 sat was 100% and pulse 83, but also BP was 149/84 with standing.  Pt did ask to sit despite the values, and became quite nauseated with the effort.  Requesting SNF stay as she is too weak to mobilize herself and typically is home alone during the day.  Follow up with acute therapy as tolerated to strengthen and increase time OOB for transition to rehab setting.    Follow Up Recommendations SNF    Equipment Recommendations  None recommended by PT    Recommendations for Other Services       Precautions / Restrictions Precautions Precautions: Fall(telemetry) Precaution Comments: has a-fib  Restrictions Weight Bearing Restrictions: No      Mobility  Bed Mobility Overal bed mobility: Needs Assistance Bed Mobility: Supine to Sit;Sit to Supine     Supine to sit: Min assist Sit to supine: Min assist;Mod assist   General bed mobility comments: min to assist initially back to bed then mod for LE's  Transfers Overall transfer level: Needs assistance Equipment used: Rolling walker (2 wheeled);1 person hand held assist Transfers: Sit to/from Stand Sit to Stand: Mod assist         General transfer comment: pt can lift her hips after practices to partially stand  Ambulation/Gait Ambulation/Gait assistance: Min assist Gait Distance (Feet): 6 Feet(2+2+2) Assistive device: Rolling walker (2 wheeled);1 person hand  held assist Gait Pattern/deviations: Step-to pattern;Decreased stride length;Wide base of support;Trunk flexed Gait velocity: reduced   General Gait Details: pt is struggling with tendency to sit down suddenly  Stairs            Wheelchair Mobility    Modified Rankin (Stroke Patients Only)       Balance Overall balance assessment: Needs assistance Sitting-balance support: Bilateral upper extremity supported;Feet supported Sitting balance-Leahy Scale: Fair     Standing balance support: Bilateral upper extremity supported;During functional activity Standing balance-Leahy Scale: Poor                               Pertinent Vitals/Pain Pain Assessment: No/denies pain    Home Living Family/patient expects to be discharged to:: Private residence Living Arrangements: Spouse/significant other Available Help at Discharge: Family;Available PRN/intermittently Type of Home: House Home Access: Stairs to enter Entrance Stairs-Rails: Doctor, general practice of Steps: 3 Home Layout: One level Home Equipment: Cane - single point Additional Comments: pt is not typically needing much with AD during the day, much more dependent now    Prior Function Level of Independence: Independent with assistive device(s)         Comments: has been using [facility] and able to care for herself but does not drive     Hand Dominance   Dominant Hand: Right    Extremity/Trunk Assessment   Upper Extremity Assessment Upper Extremity Assessment: Overall WFL for tasks assessed    Lower  Extremity Assessment Lower Extremity Assessment: Generalized weakness    Cervical / Trunk Assessment Cervical / Trunk Assessment: Kyphotic  Communication   Communication: No difficulties  Cognition Arousal/Alertness: Awake/alert Behavior During Therapy: WFL for tasks assessed/performed Overall Cognitive Status: Within Functional Limits for tasks assessed                                  General Comments: husband is present and both are displeased with her care      General Comments General comments (skin integrity, edema, etc.): ck of BP pulse and O2 sats were not enlightening for reason pt is light headed on standing    Exercises     Assessment/Plan    PT Assessment Patient needs continued PT services  PT Problem List Decreased strength;Decreased range of motion;Decreased activity tolerance;Decreased balance;Decreased mobility;Decreased coordination;Decreased knowledge of use of DME;Decreased safety awareness;Decreased skin integrity(perineal skin is irritated)       PT Treatment Interventions DME instruction;Gait training;Stair training;Functional mobility training;Therapeutic activities;Therapeutic exercise;Balance training;Neuromuscular re-education;Patient/family education    PT Goals (Current goals can be found in the Care Plan section)  Acute Rehab PT Goals Patient Stated Goal: pt wants to go home PT Goal Formulation: With patient/family Time For Goal Achievement: 02/17/18 Potential to Achieve Goals: Good    Frequency Min 2X/week   Barriers to discharge Inaccessible home environment has stairs with no rails at home    Co-evaluation               AM-PAC PT "6 Clicks" Daily Activity  Outcome Measure Difficulty turning over in bed (including adjusting bedclothes, sheets and blankets)?: Unable Difficulty moving from lying on back to sitting on the side of the bed? : Unable Difficulty sitting down on and standing up from a chair with arms (e.g., wheelchair, bedside commode, etc,.)?: Unable Help needed moving to and from a bed to chair (including a wheelchair)?: A Lot Help needed walking in hospital room?: A Lot Help needed climbing 3-5 steps with a railing? : A Lot 6 Click Score: 9    End of Session Equipment Utilized During Treatment: Gait belt Activity Tolerance: Patient limited by fatigue;Other (comment)(became light headed  with every bout of standing) Patient left: in bed;with call bell/phone within reach;with bed alarm set;with nursing/sitter in room(complaints of nausea) Nurse Communication: Mobility status;Other (comment)(reported normal vitals) PT Visit Diagnosis: Unsteadiness on feet (R26.81);Muscle weakness (generalized) (M62.81);Difficulty in walking, not elsewhere classified (R26.2)    Time: 1610-9604 PT Time Calculation (min) (ACUTE ONLY): 34 min   Charges:   PT Evaluation $PT Eval Moderate Complexity: 1 Mod PT Treatments $Gait Training: 8-22 mins       Ivar Drape 02/03/2018, 2:41 PM  Samul Dada, PT MS Acute Rehab Dept. Number: [facility] [id number] and [facility] [phone]

## 2018-02-04 LAB — CBC
HCT: 22.3 % — ABNORMAL LOW (ref 35.0–47.0)
Hemoglobin: 7.4 g/dL — ABNORMAL LOW (ref 12.0–16.0)
MCH: 32.2 pg (ref 26.0–34.0)
MCHC: 33.3 g/dL (ref 32.0–36.0)
MCV: 96.8 fL (ref 80.0–100.0)
PLATELETS: 163 10*3/uL (ref 150–440)
RBC: 2.31 MIL/uL — ABNORMAL LOW (ref 3.80–5.20)
RDW: 15.7 % — ABNORMAL HIGH (ref 11.5–14.5)
WBC: 7 10*3/uL (ref 3.6–11.0)

## 2018-02-04 LAB — GLUCOSE, CAPILLARY
GLUCOSE-CAPILLARY: 114 mg/dL — AB (ref 70–99)
GLUCOSE-CAPILLARY: 166 mg/dL — AB (ref 70–99)
GLUCOSE-CAPILLARY: 101 mg/dL — AB (ref 70–99)
Glucose-Capillary: 113 mg/dL — ABNORMAL HIGH (ref 70–99)
Glucose-Capillary: 201 mg/dL — ABNORMAL HIGH (ref 70–99)

## 2018-02-04 MED ORDER — CARBAMIDE PEROXIDE 6.5 % OT SOLN
5.0000 [drp] | Freq: Two times a day (BID) | OTIC | Status: DC
  Administered 2018-02-04 – 2018-02-05 (×2): 5 [drp] via OTIC
  Filled 2018-02-04: qty 15

## 2018-02-04 MED ORDER — VITAMIN C 500 MG PO TABS
250.0000 mg | ORAL_TABLET | Freq: Two times a day (BID) | ORAL | Status: DC
  Administered 2018-02-04 – 2018-02-06 (×5): 250 mg via ORAL
  Filled 2018-02-04 (×5): qty 1

## 2018-02-04 MED ORDER — OCUVITE-LUTEIN PO CAPS
1.0000 | ORAL_CAPSULE | Freq: Every day | ORAL | Status: DC
  Administered 2018-02-04 – 2018-02-05 (×2): 1 via ORAL
  Filled 2018-02-04 (×3): qty 1

## 2018-02-04 MED ORDER — ADULT MULTIVITAMIN W/MINERALS CH
1.0000 | ORAL_TABLET | Freq: Every day | ORAL | Status: DC
  Administered 2018-02-04 – 2018-02-05 (×2): 1 via ORAL
  Filled 2018-02-04 (×2): qty 1

## 2018-02-04 MED ORDER — PREMIER PROTEIN SHAKE: 11.0000 [oz_av] | Freq: Two times a day (BID) | ORAL | Status: DC

## 2018-02-04 NOTE — Progress Notes (Signed)
Occupational Therapy Treatment Patient Details Name: Tiffany Manning MRN: [id number] DOB: [birth year] Today's Date: 02/04/2018    History of present illness 77 yo female was admitted with UTI and AKI along with sepsis after being at home with increased weakness for the last week.  Has been hypotensive, tachycardic and had leukocytosis.    OT comments  Pt seen for OT tx this date. Pt agreeable to functional mobility training in preparation for advancing toileting to [facility]. Pt denies dizziness at start of session. BP 116/59 per nurse tech in room at start of session following bed level toileting. Pt able to perform sup>sit EOB with HOB elevated and additional time/effort to perform at supervision level. Pt instructed in hand/foot placement to improve transfer technique requiring min assist for lift off during transfer with RW. Pt endorsed feeling weak during mobility but denied dizziness. Pt able to perform side stepping EOB with CGA, after approx 3 steps, pt reported need to sit back down. Pt endorsed becoming slightly dizzy at end of mobility/once sitting. LUE noted during session to be edematous. Repositioned on pillow at end of session. Pt progressing towards goals, able to do more this session than previous evaluation. Pt eager to work with therapy and continue to get stronger to return to PLOF. STR remains appropriate. Will continue to progress.   Follow Up Recommendations  SNF    Equipment Recommendations  Other (comment)(TBD at next venue of care)    Recommendations for Other Services      Precautions / Restrictions Precautions Precautions: Fall Precaution Comments: has a-fib, monitor HR, per cardio want <110 resting HR Restrictions Weight Bearing Restrictions: No       Mobility Bed Mobility Overal bed mobility: Needs Assistance Bed Mobility: Supine to Sit;Sit to Supine     Supine to sit: Supervision;HOB elevated Sit to supine: Min assist   General bed mobility comments: min A  for BLE mgt back to bed  Transfers Overall transfer level: Needs assistance Equipment used: Rolling walker (2 wheeled) Transfers: Sit to/from Stand;Lateral/Scoot Transfers Sit to Stand: Min assist        Lateral/Scoot Transfers: Min guard General transfer comment: cues for hand/foot placement to improve technique, min assist to initiate standing/lift off, CGA for lateral steps EOB with RW    Balance Overall balance assessment: Needs assistance Sitting-balance support: Feet unsupported;Single extremity supported Sitting balance-Leahy Scale: Fair     Standing balance support: Bilateral upper extremity supported;During functional activity Standing balance-Leahy Scale: Poor                             ADL either performed or assessed with clinical judgement   ADL Overall ADL's : Needs assistance/impaired                         Toilet Transfer: RW;Stand-pivot;Minimal assistance                   Vision Baseline Vision/History: Wears glasses Wears Glasses: Reading only Patient Visual Report: No change from baseline     Perception     Praxis      Cognition Arousal/Alertness: Awake/alert Behavior During Therapy: WFL for tasks assessed/performed Overall Cognitive Status: Within Functional Limits for tasks assessed  Exercises Other Exercises Other Exercises: pt instructed in functional mobility training in preparation for toilet transfers to [facility]   Shoulder Instructions       General Comments LUE edema noted, positioned on pillow at end of session, pt noted RN is aware. Pt endorses slight dizziness after side stepping EOB once sitting. HR remained 68-81 resting to mobility tasks    Pertinent Vitals/ Pain       Pain Assessment: No/denies pain Pain Intervention(s): Monitored during session  Home Living                                          Prior  Functioning/Environment              Frequency  Min 1X/week        Progress Toward Goals  OT Goals(current goals can now be found in the care plan section)  Progress towards OT goals: Progressing toward goals  Acute Rehab OT Goals Patient Stated Goal: "I want to be able to go to the bathroom myself at home" OT Goal Formulation: With patient Time For Goal Achievement: 02/14/18 Potential to Achieve Goals: Good  Plan Discharge plan remains appropriate;Frequency remains appropriate    Co-evaluation                 AM-PAC PT "6 Clicks" Daily Activity     Outcome Measure   Help from another person eating meals?: None Help from another person taking care of personal grooming?: None Help from another person toileting, which includes using toliet, bedpan, or urinal?: A Lot Help from another person bathing (including washing, rinsing, drying)?: A Lot Help from another person to put on and taking off regular upper body clothing?: A Little Help from another person to put on and taking off regular lower body clothing?: A Lot 6 Click Score: 17    End of Session Equipment Utilized During Treatment: Gait belt;Rolling walker  OT Visit Diagnosis: Other abnormalities of gait and mobility (R26.89);Muscle weakness (generalized) (M62.81);Dizziness and giddiness (R42)   Activity Tolerance Patient tolerated treatment well   Patient Left in bed;with call bell/phone within reach;with bed alarm set;with family/visitor present;Other (comment)(LUE positioned on pillow to elevate, floated heels)   Nurse Communication          Time: 1610-9604 OT Time Calculation (min): 25 min  Charges: OT General Charges $OT Visit: 1 Visit OT Treatments $Self Care/Home Management : 23-37 mins  Richrd Prime, MPH, MS, OTR/L ascom [phone] 02/04/18, 5:29 PM

## 2018-02-04 NOTE — Progress Notes (Signed)
Nutrition Follow Up Note   DOCUMENTATION CODES:   Not applicable  INTERVENTION:   Pt at high refeeding risk; recommend monitor K, Mg and P labs   Vitamin C 293m po BID  Ocuvite daily for wound healing (provides zinc, vitamin A, vitamin C, Vitamin E, copper, and selenium)  MVI daily   Magic cup TID with meals, each supplement provides 290 kcal and 9 grams of protein  Snacks (yogurt, peanut butter snacks, cottage cheese)  NUTRITION DIAGNOSIS:   Inadequate oral intake related to acute illness as evidenced by per patient/family report.  GOAL:   Patient will meet greater than or equal to 90% of their needs  -not met   MONITOR:   PO intake, Labs, Weight trends, Skin, I & O's  ASSESSMENT:   77y.o. caucasian female with medical problems of Diabetes, HTN, HLD, salivary gland stones [facility] generalized fatigue and is Dx with severe sepsis, UTI and ARF.   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  Pt with poor appetite and oral intake since admit. Pt did have a couple of days in which her po intake was fair but only eating sips and bites of her breakfast this morning. Pt does not like supplements per her husband's report; will try to offer patient Premier Protein instead of Ensure. Pt with multiple wounds and likely scurvy given her poor po intake; this can cause depression and weakness. Will initiate vitamin C and ocuvite for wound healing. Pt likely at high refeeding risk; recommend check M and P labs as low phosphorus can cause weakness as well. Pt noted to have low P on 9/25 which has not been rechecked. RD will add snacks and supplements to encourage wound healing.    Medications reviewed and include: insulin, NaCl @75ml /hr, ceftriaxone, pepcid  Labs reviewed: K 3.8 wnl, creat 1.31(H), Ca 6.9(L)- 9/29 Hgb 7.4(L), Hct 22.3(L)-  9/30  NUTRITION - FOCUSED PHYSICAL EXAM:    Most Recent Value  Orbital Region  No depletion  Upper Arm Region  Mild depletion  Thoracic and Lumbar Region  Mild depletion  Buccal Region  No depletion  Temple Region  No depletion  Clavicle Bone Region  Mild depletion  Clavicle and Acromion Bone Region  Mild depletion  Scapular Bone Region  Unable to assess  Dorsal Hand  Mild depletion  Patellar Region  Mild depletion  Anterior Thigh Region  Mild depletion  Posterior Calf Region  Mild depletion  Edema (RD Assessment)  None  Hair  Reviewed  Eyes  Reviewed  Mouth  Reviewed  Skin  Reviewed  Nails  Reviewed     Diet Order:   Diet Order            Diet regular Room service appropriate? Yes; Fluid consistency: Thin  Diet effective now             EDUCATION NEEDS:   No education needs have been identified at this time  Skin:  Skin Assessment: Reviewed RN Assessment(non pressure injury leg ( 9cm x 5.5cm x 0.2cm ), Stage I coccyx, incision perineum )  Last BM:  9/30- type 6  Height:   Ht Readings from Last 1 Encounters:  01/27/18 5' (1.524 m)    Weight:   Wt Readings from Last 1 Encounters:  02/01/18 62.1 kg    Ideal Body Weight:  45.4 kg  BMI:  Body mass index is 26.76 kg/m.  Estimated Nutritional Needs:   Kcal:  1400-1600kcal/day   Protein:  74-86g/day   Fluid:  >1.4L/day   Koleen Distance MS, RD, LDN Pager #- [phone] Office#- [phone] After Hours Pager: [phone]

## 2018-02-04 NOTE — Progress Notes (Signed)
PHARMACY CONSULT NOTE FOR:  OUTPATIENT  PARENTERAL ANTIBIOTIC THERAPY (OPAT)  Indication: complicated pyelonephritis Regimen: ceftriaxone 2gm IV q24h End date: 02/11/2018  IV antibiotic discharge orders are pended. To discharging provider:  please sign these orders via discharge navigator,  Select New Orders & click on the button choice - Manage This Unsigned Work.     Thank you for allowing pharmacy to be a part of this patient's care.  Juliette Alcide, PharmD, BCPS.   Work Cell: [phone] 02/04/2018 10:25 AM

## 2018-02-04 NOTE — Progress Notes (Signed)
[facility], [place] 02/04/18  Subjective:  Creatinine currently 1.3. Patient resting comfortably in bed this a.m. Overall feeling better as compared to admission.  Objective:  Vital signs in last 24 hours:  Temp:  [97.6 F (36.4 C)-98.4 F (36.9 C)] 97.6 F (36.4 C) (09/30 0756) Pulse Rate:  [54-73] 55 (09/30 0756) Resp:  [18-19] 19 (09/30 0756) BP: (112-130)/(55-67) 117/67 (09/30 0756) SpO2:  [100 %] 100 % (09/30 0756)  Weight change:  Filed Weights   01/27/18 0840 01/27/18 1122 02/01/18 0320  Weight: 54.4 kg 57.4 kg 62.1 kg    Intake/Output:    Intake/Output Summary (Last 24 hours) at 02/04/2018 1226 Last data filed at 02/04/2018 0425 Gross per 24 hour  Intake 1907.66 ml  Output 300 ml  Net 1607.66 ml     Physical Exam: General: Ill appearing  HEENT moist oral mucus membranes  Neck supple  Pulm/lungs Decreased breath sounds at bases  CVS/Heart Irregular  Abdomen:  Soft, NTND, BS present  Extremities: trace edema  Neurologic: Alert, follows few simple commands  Skin: Warm, dry  GU: Foley in place       Basic Metabolic Panel:  Recent Labs  Lab 01/28/18 1313  01/29/18 0432 01/30/18 0218 01/31/18 0454 02/01/18 0447 02/02/18 0549 02/03/18 0956  NA 143   < > 146* 147* 146* 140 139 136  K 4.3   < > 4.7 3.9 3.4* 2.9* 4.0 3.8  CL 115*   < > 117* 120* 118* 112* 112* 108  CO2 15*   < > 17* 20* 20* 20* 20* 20*  GLUCOSE 317*   < > 256* 135* 84 88 98 185*  BUN 72*   < > 59* 48* 38* 30* 23 19  CREATININE 2.62*   < > 2.31* 1.96* 1.78* 1.37* 1.50* 1.31*  CALCIUM 7.6*   < > 7.6* 7.8* 7.6* 7.2* 7.2* 6.9*  MG 1.8  --  1.6* 2.0  --   --   --   --   PHOS  --   --  2.2* 2.4*  --   --   --   --    < > = values in this interval not displayed.     CBC: Recent Labs  Lab 01/29/18 0432 01/30/18 0218 01/31/18 0454 02/01/18 0447 02/02/18 0549 02/04/18 0614  WBC 12.0* 11.8* 9.1 8.6 9.6 7.0  NEUTROABS 9.7*  --   --   --   --   --    HGB 8.8* 8.2* 7.9* 8.1* 8.1* 7.4*  HCT 27.1* 25.4* 24.0* 24.4* 24.4* 22.3*  MCV 93.3 93.7 95.1 95.0 95.8 96.8  PLT 271 215 193 196 196 163     No results found for: HEPBSAG, HEPBSAB, HEPBIGM    Microbiology:  Recent Results (from the past 240 hour(s))  Blood Culture (routine x 2)     Status: Abnormal   Collection Time: 01/27/18  8:40 AM  Result Value Ref Range Status   Specimen Description   Final    BLOOD RIGHT ANTECUBITAL Performed at [facility], [address] [place], [place] [address]    Special Requests   Final    BOTTLES DRAWN AEROBIC AND ANAEROBIC Blood Culture adequate volume Performed at [facility], [address] [place], [place] [address]    Culture  Setup Time   Final    AEROBIC BOTTLE ONLY GRAM POSITIVE COCCI CRITICAL RESULT CALLED TO, READ BACK BY AND VERIFIED WITH: MATT [facility] 01/28/18 0618 REC Performed at  [facility], [address], [place], [place] [address]    Culture STAPHYLOCOCCUS HOMINIS (A)  Final   Report Status 01/30/2018 FINAL  Final   Organism ID, Bacteria STAPHYLOCOCCUS HOMINIS  Final      Susceptibility   Staphylococcus hominis - MIC*    CIPROFLOXACIN >=8 RESISTANT Resistant     ERYTHROMYCIN <=0.25 SENSITIVE Sensitive     GENTAMICIN <=0.5 SENSITIVE Sensitive     OXACILLIN >=4 RESISTANT Resistant     TETRACYCLINE >=16 RESISTANT Resistant     VANCOMYCIN <=0.5 SENSITIVE Sensitive     TRIMETH/SULFA >=320 RESISTANT Resistant     CLINDAMYCIN <=0.25 SENSITIVE Sensitive     RIFAMPIN <=0.5 SENSITIVE Sensitive     Inducible Clindamycin NEGATIVE Sensitive     * STAPHYLOCOCCUS HOMINIS  Blood Culture ID Panel (Reflexed)     Status: Abnormal   Collection Time: 01/27/18  8:40 AM  Result Value Ref Range Status   Enterococcus species NOT DETECTED NOT DETECTED Final   Listeria monocytogenes NOT DETECTED NOT DETECTED Final   Staphylococcus species DETECTED (A) NOT DETECTED Final    Comment: Methicillin (oxacillin)  resistant coagulase negative staphylococcus. Possible blood culture contaminant (unless isolated from more than one blood culture draw or clinical case suggests pathogenicity). No antibiotic treatment is indicated for blood  culture contaminants. CRITICAL RESULT CALLED TO, READ BACK BY AND VERIFIED WITH: MATT MCBANE 01/28/18 0618 REC    Staphylococcus aureus NOT DETECTED NOT DETECTED Final   Methicillin resistance DETECTED (A) NOT DETECTED Final    Comment: CRITICAL RESULT CALLED TO, READ BACK BY AND VERIFIED WITH: MATT MCBANE 01/28/18 0618 REC    Streptococcus species NOT DETECTED NOT DETECTED Final   Streptococcus agalactiae NOT DETECTED NOT DETECTED Final   Streptococcus pneumoniae NOT DETECTED NOT DETECTED Final   Streptococcus pyogenes NOT DETECTED NOT DETECTED Final   Acinetobacter baumannii NOT DETECTED NOT DETECTED Final   Enterobacteriaceae species NOT DETECTED NOT DETECTED Final   Enterobacter cloacae complex NOT DETECTED NOT DETECTED Final   Escherichia coli NOT DETECTED NOT DETECTED Final   Klebsiella oxytoca NOT DETECTED NOT DETECTED Final   Klebsiella pneumoniae NOT DETECTED NOT DETECTED Final   Proteus species NOT DETECTED NOT DETECTED Final   Serratia marcescens NOT DETECTED NOT DETECTED Final   Haemophilus influenzae NOT DETECTED NOT DETECTED Final   Neisseria meningitidis NOT DETECTED NOT DETECTED Final   Pseudomonas aeruginosa NOT DETECTED NOT DETECTED Final   Candida albicans NOT DETECTED NOT DETECTED Final   Candida glabrata NOT DETECTED NOT DETECTED Final   Candida krusei NOT DETECTED NOT DETECTED Final   Candida parapsilosis NOT DETECTED NOT DETECTED Final   Candida tropicalis NOT DETECTED NOT DETECTED Final    Comment: Performed at [facility], [address] [place], [place] [address]  Blood Culture (routine x 2)     Status: Abnormal   Collection Time: 01/27/18  8:46 AM  Result Value Ref Range Status   Specimen Description   Final    BLOOD LEFT  ANTECUBITAL Performed at [facility], [address], [place], [place] [address]    Special Requests   Final    BOTTLES DRAWN AEROBIC AND ANAEROBIC Blood Culture adequate volume Performed at [facility], [address] [place], [place] [address]    Culture  Setup Time   Final    GRAM POSITIVE COCCI AEROBIC BOTTLE ONLY CRITICAL VALUE NOTED.  VALUE IS CONSISTENT WITH PREVIOUSLY REPORTED AND CALLED VALUE. Performed at [facility], [phone]  [address] [place], [place] [address]    Culture (A)  Final    STAPHYLOCOCCUS CAPITIS THE SIGNIFICANCE OF ISOLATING THIS ORGANISM FROM A SINGLE SET OF BLOOD CULTURES WHEN MULTIPLE SETS ARE DRAWN IS UNCERTAIN. PLEASE NOTIFY THE MICROBIOLOGY DEPARTMENT WITHIN ONE WEEK IF SPECIATION AND SENSITIVITIES ARE REQUIRED. Performed at [facility], [address] [place], [place] [address]    Report Status 01/30/2018 FINAL  Final  Urine Culture     Status: Abnormal   Collection Time: 01/27/18  9:45 AM  Result Value Ref Range Status   Specimen Description   Final    URINE, CATHETERIZED Performed at [facility], [address] [place], [place] [address]    Special Requests   Final    NONE Performed at [facility], [address] [place], [place] [address]    Culture >=100,000 COLONIES/mL ESCHERICHIA COLI (A)  Final   Report Status 01/30/2018 FINAL  Final   Organism ID, Bacteria ESCHERICHIA COLI (A)  Final      Susceptibility   Escherichia coli - MIC*    AMPICILLIN 16 INTERMEDIATE Intermediate     CEFAZOLIN <=4 SENSITIVE Sensitive     CEFTRIAXONE <=1 SENSITIVE Sensitive     CIPROFLOXACIN <=0.25 SENSITIVE Sensitive     GENTAMICIN <=1 SENSITIVE Sensitive     IMIPENEM <=0.25 SENSITIVE Sensitive     NITROFURANTOIN 32 SENSITIVE Sensitive     TRIMETH/SULFA <=20 SENSITIVE Sensitive     AMPICILLIN/SULBACTAM 8 SENSITIVE Sensitive     PIP/TAZO <=4 SENSITIVE Sensitive     Extended ESBL NEGATIVE Sensitive      * >=100,000 COLONIES/mL ESCHERICHIA COLI  MRSA PCR Screening     Status: None   Collection Time: 01/27/18 11:25 AM  Result Value Ref Range Status   MRSA by PCR NEGATIVE NEGATIVE Final    Comment:        The GeneXpert MRSA Assay (FDA approved for NASAL specimens only), is one component of a comprehensive MRSA colonization surveillance program. It is not intended to diagnose MRSA infection nor to guide or monitor treatment for MRSA infections. Performed at [facility], [address] [place], [place] [address]   C difficile quick scan w PCR reflex     Status: None   Collection Time: 01/28/18  1:14 PM  Result Value Ref Range Status   C Diff antigen NEGATIVE NEGATIVE Final   C Diff toxin NEGATIVE NEGATIVE Final   C Diff interpretation No C. difficile detected.  Final    Comment: Performed at [facility], [address], [place], [place] [address]  Urine Culture     Status: None   Collection Time: 01/28/18  6:31 PM  Result Value Ref Range Status   Specimen Description   Final    URINE, RANDOM Performed at [facility], [address] [place], [place] [address]    Special Requests RIGHT RENAL PELVIS  Final   Culture   Final    NO GROWTH Performed at [facility], [address], [place], [place] [address]    Report Status 01/30/2018 FINAL  Final  Urine Culture     Status: Abnormal   Collection Time: 01/28/18  6:33 PM  Result Value Ref Range Status   Specimen Description   Final    URINE, RANDOM Performed at [facility], [address] [place], [place] [address]    Special Requests   Final    LEFT KIDNEY Performed at [facility], [address] [place], [place] [address]    Culture 10,000 COLONIES/mL ESCHERICHIA COLI (A)  Final   Report Status 01/31/2018 FINAL  Final   Organism ID, Bacteria ESCHERICHIA COLI (A)  Final      Susceptibility   Escherichia coli - MIC*    AMPICILLIN <=2 SENSITIVE Sensitive      CEFAZOLIN <=4 SENSITIVE Sensitive     CEFTRIAXONE <=1 SENSITIVE Sensitive     CIPROFLOXACIN <=0.25 SENSITIVE Sensitive     GENTAMICIN 2 SENSITIVE Sensitive     IMIPENEM <=0.25 SENSITIVE Sensitive     NITROFURANTOIN 64 INTERMEDIATE Intermediate     TRIMETH/SULFA <=20 SENSITIVE Sensitive     AMPICILLIN/SULBACTAM <=2 SENSITIVE Sensitive     PIP/TAZO <=4 SENSITIVE Sensitive     Extended ESBL NEGATIVE Sensitive     * 10,000 COLONIES/mL ESCHERICHIA COLI  CULTURE, BLOOD (ROUTINE X 2) w Reflex to ID Panel     Status: None   Collection Time: 01/29/18  9:32 AM  Result Value Ref Range Status   Specimen Description BLOOD RIGH HAND  Final   Special Requests   Final    BOTTLES DRAWN AEROBIC AND ANAEROBIC Blood Culture adequate volume   Culture   Final    NO GROWTH 5 DAYS Performed at [facility], [address] [place], [place] [address]    Report Status 02/03/2018 FINAL  Final  CULTURE, BLOOD (ROUTINE X 2) w Reflex to ID Panel     Status: None   Collection Time: 01/29/18  9:43 AM  Result Value Ref Range Status   Specimen Description BLOOD LEFT HAND  Final   Special Requests   Final    BOTTLES DRAWN AEROBIC AND ANAEROBIC Blood Culture adequate volume   Culture   Final    NO GROWTH 5 DAYS Performed at [facility], [address] [place], [place] [address]    Report Status 02/03/2018 FINAL  Final    Coagulation Studies: No results for input(s): LABPROT, INR in the last 72 hours.  Urinalysis: No results for input(s): COLORURINE, LABSPEC, PHURINE, GLUCOSEU, HGBUR, BILIRUBINUR, KETONESUR, PROTEINUR, UROBILINOGEN, NITRITE, LEUKOCYTESUR in the last 72 hours.  Invalid input(s): APPERANCEUR    Imaging: Dg Chest 1 View  Result Date: 02/02/2018 CLINICAL DATA:  PICC line placement EXAM: CHEST  1 VIEW COMPARISON:  Chest x-rays from January 27, 2018 and January 29, 2018 FINDINGS: The nodularity in the left upper lobe seen on a CT scan of the neck in January of 2019  remains. The right PICC line terminates in the central SVC. No pneumothorax. Mild atelectasis in the left base. Cardiomegaly. The hila and mediastinum are stable. No other acute abnormalities. Blunting of left costophrenic angle could represent a tiny effusion versus thickening. IMPRESSION: 1. The nodule worrisome for malignancy in the left upper lobe seen on the CT scan from January of 2019 is again identified. 2. The PICC line is in good position. 3. Probable small effusion with underlying atelectasis in the left base. Electronically Signed   By: Gerome Sam III M.D   On: 02/02/2018 19:53     Medications:   . sodium chloride 75 mL/hr at 02/04/18 0506  . cefTRIAXone (ROCEPHIN)  IV 2 g (02/04/18 1106)   . apixaban  2.5 mg Oral BID  . atorvastatin  20 mg Oral q1800  . carbamide peroxide  5 drop Both EARS BID  . chlorhexidine  15 mL Mouth Rinse BID  . diltiazem  60 mg Oral Q12H  .  famotidine  20 mg Oral Daily  . insulin aspart  0-5 Units Subcutaneous QHS  . insulin aspart  0-9 Units Subcutaneous TID WC  . insulin glargine  6 Units Subcutaneous QHS  . mouth rinse  15 mL Mouth Rinse q12n4p  . metoprolol tartrate  75 mg Oral BID  . multivitamin with minerals  1 tablet Oral Daily  . multivitamin-lutein  1 capsule Oral Daily  . protein supplement shake  11 oz Oral BID BM  . sodium chloride flush  10 mL Intracatheter Q12H  . sodium chloride flush  10 mL Intravenous Q12H  . vitamin C  250 mg Oral BID   acetaminophen **OR** acetaminophen, ondansetron **OR** ondansetron (ZOFRAN) IV, promethazine, traMADol  Assessment/ Plan:  77 y.o. caucasian female with medical problems of Diabetes, HTN, HLD, salivary gland stones who was admitted to [facility] on 01/27/2018 for evaluation of generalized fatigue and is Dx with severe sepsis, UTI and ARF.  1. ARF, multifactorial 2. B/l hydronephrosis- b/l ureteral stent placed 9/23 by Dr Annabell Howells 3. Sepsis, Gram positive bacteremia  9/22, negative cultures 9/24 4.  Acidosis,  Hypernatremia, Hypokalemia 5. UTI- E Coli 6. CKD st 3, Baseline Cr 1.38/ GFR 36 from jan 2019 7. DM-2 with CKD 8. New onset A Fib 9. Enlarging LUL nodule 10. 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 Patient is s/p b/l ureteral stent placement.  Foley kept in place to avoid hydronephrosis as well.   Plan:  enal function continues to improve.  Creatinine currently 1.31 which appears to be her baseline.  Avoid nephrotoxins in the renal recovery period.  She will need outpatient urology followup. Otherwise disposition as per hospitalist.  LOS: 8 Tomoya Ringwald 9/30/201912:26 PM  [facility] [place], [place] [phone]  Note: This note was prepared with Dragon dictation. Any transcription errors are unintentional

## 2018-02-04 NOTE — Care Management Important Message (Signed)
Copy of signed IM left with patient in room.  

## 2018-02-04 NOTE — Progress Notes (Signed)
Patient ID: Tiffany Manning, female   DOB: [birth year], 77 y.o.   MRN: [id number]  [facility] PROGRESS NOTE  Tiffany Manning YNW:[id number] DOB: 1940-08-09 DOA: 01/27/2018 PCP: Jaclyn Shaggy, MD  HPI/Subjective: Patient feeling that she wants to go home rather than rehab.  Still very weak.  Otherwise feeling okay.  Objective: Vitals:   02/04/18 0425 02/04/18 0756  BP: 123/63 117/67  Pulse: (!) 54 (!) 55  Resp: 18 19  Temp: 98 F (36.7 C) 97.6 F (36.4 C)  SpO2: 100% 100%    Filed Weights   01/27/18 0840 01/27/18 1122 02/01/18 0320  Weight: 54.4 kg 57.4 kg 62.1 kg    ROS: Review of Systems  Constitutional: Positive for malaise/fatigue. Negative for chills and fever.  Eyes: Negative for blurred vision.  Respiratory: Negative for cough and shortness of breath.   Cardiovascular: Negative for chest pain.  Gastrointestinal: Negative for abdominal pain, constipation, diarrhea, nausea and vomiting.  Genitourinary: Negative for dysuria.  Musculoskeletal: Negative for joint pain.  Neurological: Negative for dizziness and headaches.   Exam: Physical Exam  Constitutional: She is oriented to person, place, and time.  HENT:  Nose: No mucosal edema.  Mouth/Throat: No oropharyngeal exudate or posterior oropharyngeal edema.  Eyes: Pupils are equal, round, and reactive to light. Conjunctivae, EOM and lids are normal.  Neck: No JVD present. Carotid bruit is not present. No edema present. No thyroid mass and no thyromegaly present.  Cardiovascular: S1 normal and S2 normal. Exam reveals no gallop.  No murmur heard. Pulses:      Dorsalis pedis pulses are 2+ on the right side, and 2+ on the left side.  Respiratory: No respiratory distress. She has no wheezes. She has no rhonchi. She has no rales.  GI: Soft. Bowel sounds are normal. There is no tenderness.  Musculoskeletal:       Right ankle: She exhibits no swelling.       Left ankle: She exhibits no swelling.  Lymphadenopathy:    She  has no cervical adenopathy.  Neurological: She is alert and oriented to person, place, and time. No cranial nerve deficit.  Skin: Skin is warm. No rash noted. Nails show no clubbing.  Psychiatric: She has a normal mood and affect.      Data Reviewed: Basic Metabolic Panel: Recent Labs  Lab 01/29/18 0432 01/30/18 0218 01/31/18 0454 02/01/18 0447 02/02/18 0549 02/03/18 0956  NA 146* 147* 146* 140 139 136  K 4.7 3.9 3.4* 2.9* 4.0 3.8  CL 117* 120* 118* 112* 112* 108  CO2 17* 20* 20* 20* 20* 20*  GLUCOSE 256* 135* 84 88 98 185*  BUN 59* 48* 38* 30* 23 19  CREATININE 2.31* 1.96* 1.78* 1.37* 1.50* 1.31*  CALCIUM 7.6* 7.8* 7.6* 7.2* 7.2* 6.9*  MG 1.6* 2.0  --   --   --   --   PHOS 2.2* 2.4*  --   --   --   --    Liver Function Tests: Recent Labs  Lab 01/29/18 0432  AST 19  ALT 14  ALKPHOS 67  BILITOT 0.7  PROT 5.9*  ALBUMIN 2.7*   Recent Labs  Lab 01/29/18 0432  LIPASE 32   CBC: Recent Labs  Lab 01/29/18 0432 01/30/18 0218 01/31/18 0454 02/01/18 0447 02/02/18 0549 02/04/18 0614  WBC 12.0* 11.8* 9.1 8.6 9.6 7.0  NEUTROABS 9.7*  --   --   --   --   --   HGB 8.8* 8.2* 7.9* 8.1* 8.1* 7.4*  HCT 27.1* 25.4* 24.0* 24.4* 24.4* 22.3*  MCV 93.3 93.7 95.1 95.0 95.8 96.8  PLT 271 215 193 196 196 163   Cardiac Enzymes: Recent Labs  Lab 01/29/18 0432  CKTOTAL 175    CBG: Recent Labs  Lab 02/03/18 1652 02/03/18 2053 02/04/18 0232 02/04/18 0758 02/04/18 1207  GLUCAP 88 151* 114* 113* 166*    Recent Results (from the past 240 hour(s))  Blood Culture (routine x 2)     Status: Abnormal   Collection Time: 01/27/18  8:40 AM  Result Value Ref Range Status   Specimen Description   Final    BLOOD RIGHT ANTECUBITAL Performed at [facility], [address], [place], [place] [address]    Special Requests   Final    BOTTLES DRAWN AEROBIC AND ANAEROBIC Blood Culture adequate volume Performed at [facility], [address] [place],  [place] [address]    Culture  Setup Time   Final    AEROBIC BOTTLE ONLY GRAM POSITIVE COCCI CRITICAL RESULT CALLED TO, READ BACK BY AND VERIFIED WITH: MATT [facility] 01/28/18 0618 REC Performed at [facility], [address], [place], [place] [address]    Culture STAPHYLOCOCCUS HOMINIS (A)  Final   Report Status 01/30/2018 FINAL  Final   Organism ID, Bacteria STAPHYLOCOCCUS HOMINIS  Final      Susceptibility   Staphylococcus hominis - MIC*    CIPROFLOXACIN >=8 RESISTANT Resistant     ERYTHROMYCIN <=0.25 SENSITIVE Sensitive     GENTAMICIN <=0.5 SENSITIVE Sensitive     OXACILLIN >=4 RESISTANT Resistant     TETRACYCLINE >=16 RESISTANT Resistant     VANCOMYCIN <=0.5 SENSITIVE Sensitive     TRIMETH/SULFA >=320 RESISTANT Resistant     CLINDAMYCIN <=0.25 SENSITIVE Sensitive     RIFAMPIN <=0.5 SENSITIVE Sensitive     Inducible Clindamycin NEGATIVE Sensitive     * STAPHYLOCOCCUS HOMINIS  Blood Culture ID Panel (Reflexed)     Status: Abnormal   Collection Time: 01/27/18  8:40 AM  Result Value Ref Range Status   Enterococcus species NOT DETECTED NOT DETECTED Final   Listeria monocytogenes NOT DETECTED NOT DETECTED Final   Staphylococcus species DETECTED (A) NOT DETECTED Final    Comment: Methicillin (oxacillin) resistant coagulase negative staphylococcus. Possible blood culture contaminant (unless isolated from more than one blood culture draw or clinical case suggests pathogenicity). No antibiotic treatment is indicated for blood  culture contaminants. CRITICAL RESULT CALLED TO, READ BACK BY AND VERIFIED WITH: MATT [facility] 01/28/18 0618 REC    Staphylococcus aureus NOT DETECTED NOT DETECTED Final   Methicillin resistance DETECTED (A) NOT DETECTED Final    Comment: CRITICAL RESULT CALLED TO, READ BACK BY AND VERIFIED WITH: MATT [facility] 01/28/18 0618 REC    Streptococcus species NOT DETECTED NOT DETECTED Final   Streptococcus agalactiae NOT DETECTED NOT DETECTED Final   Streptococcus pneumoniae  NOT DETECTED NOT DETECTED Final   Streptococcus pyogenes NOT DETECTED NOT DETECTED Final   Acinetobacter baumannii NOT DETECTED NOT DETECTED Final   Enterobacteriaceae species NOT DETECTED NOT DETECTED Final   Enterobacter cloacae complex NOT DETECTED NOT DETECTED Final   Escherichia coli NOT DETECTED NOT DETECTED Final   Klebsiella oxytoca NOT DETECTED NOT DETECTED Final   Klebsiella pneumoniae NOT DETECTED NOT DETECTED Final   Proteus species NOT DETECTED NOT DETECTED Final   Serratia marcescens NOT DETECTED NOT DETECTED Final   Haemophilus influenzae NOT DETECTED NOT DETECTED Final   Neisseria meningitidis NOT DETECTED NOT DETECTED Final  Pseudomonas aeruginosa NOT DETECTED NOT DETECTED Final   Candida albicans NOT DETECTED NOT DETECTED Final   Candida glabrata NOT DETECTED NOT DETECTED Final   Candida krusei NOT DETECTED NOT DETECTED Final   Candida parapsilosis NOT DETECTED NOT DETECTED Final   Candida tropicalis NOT DETECTED NOT DETECTED Final    Comment: Performed at [facility], [address] [place], [place] [address]  Blood Culture (routine x 2)     Status: Abnormal   Collection Time: 01/27/18  8:46 AM  Result Value Ref Range Status   Specimen Description   Final    BLOOD LEFT ANTECUBITAL Performed at [facility], [address] [place], [place] [address]    Special Requests   Final    BOTTLES DRAWN AEROBIC AND ANAEROBIC Blood Culture adequate volume Performed at [facility], [address] [place], [place] [address]    Culture  Setup Time   Final    GRAM POSITIVE COCCI AEROBIC BOTTLE ONLY CRITICAL VALUE NOTED.  VALUE IS CONSISTENT WITH PREVIOUSLY REPORTED AND CALLED VALUE. Performed at [facility], [address] [place], [place] [address]    Culture (A)  Final    STAPHYLOCOCCUS CAPITIS THE SIGNIFICANCE OF ISOLATING THIS ORGANISM FROM A SINGLE SET OF BLOOD CULTURES WHEN MULTIPLE SETS ARE DRAWN IS UNCERTAIN. PLEASE  NOTIFY THE MICROBIOLOGY DEPARTMENT WITHIN ONE WEEK IF SPECIATION AND SENSITIVITIES ARE REQUIRED. Performed at [facility], [address], [place], [place] [address]    Report Status 01/30/2018 FINAL  Final  Urine Culture     Status: Abnormal   Collection Time: 01/27/18  9:45 AM  Result Value Ref Range Status   Specimen Description   Final    URINE, CATHETERIZED Performed at [facility], [address] [place], [place] [address]    Special Requests   Final    NONE Performed at [facility], [address] [place], [place] [address]    Culture >=100,000 COLONIES/mL ESCHERICHIA COLI (A)  Final   Report Status 01/30/2018 FINAL  Final   Organism ID, Bacteria ESCHERICHIA COLI (A)  Final      Susceptibility   Escherichia coli - MIC*    AMPICILLIN 16 INTERMEDIATE Intermediate     CEFAZOLIN <=4 SENSITIVE Sensitive     CEFTRIAXONE <=1 SENSITIVE Sensitive     CIPROFLOXACIN <=0.25 SENSITIVE Sensitive     GENTAMICIN <=1 SENSITIVE Sensitive     IMIPENEM <=0.25 SENSITIVE Sensitive     NITROFURANTOIN 32 SENSITIVE Sensitive     TRIMETH/SULFA <=20 SENSITIVE Sensitive     AMPICILLIN/SULBACTAM 8 SENSITIVE Sensitive     PIP/TAZO <=4 SENSITIVE Sensitive     Extended ESBL NEGATIVE Sensitive     * >=100,000 COLONIES/mL ESCHERICHIA COLI  MRSA PCR Screening     Status: None   Collection Time: 01/27/18 11:25 AM  Result Value Ref Range Status   MRSA by PCR NEGATIVE NEGATIVE Final    Comment:        The GeneXpert MRSA Assay (FDA approved for NASAL specimens only), is one component of a comprehensive MRSA colonization surveillance program. It is not intended to diagnose MRSA infection nor to guide or monitor treatment for MRSA infections. Performed at [facility], [address] [place], [place] [address]   C difficile quick scan w PCR reflex     Status: None   Collection Time: 01/28/18  1:14 PM  Result Value Ref Range Status   C Diff antigen NEGATIVE  NEGATIVE  Final   C Diff toxin NEGATIVE NEGATIVE Final   C Diff interpretation No C. difficile detected.  Final    Comment: Performed at [facility], [address], [place], [place] [address]  Urine Culture     Status: None   Collection Time: 01/28/18  6:31 PM  Result Value Ref Range Status   Specimen Description   Final    URINE, RANDOM Performed at [facility], [address] [place], [place] [address]    Special Requests RIGHT RENAL PELVIS  Final   Culture   Final    NO GROWTH Performed at [facility], [address], [place], [place] [address]    Report Status 01/30/2018 FINAL  Final  Urine Culture     Status: Abnormal   Collection Time: 01/28/18  6:33 PM  Result Value Ref Range Status   Specimen Description   Final    URINE, RANDOM Performed at [facility], [address], [place], [place] [address]    Special Requests   Final    LEFT KIDNEY Performed at [facility], [address], [place], [place] [address]    Culture 10,000 COLONIES/mL ESCHERICHIA COLI (A)  Final   Report Status 01/31/2018 FINAL  Final   Organism ID, Bacteria ESCHERICHIA COLI (A)  Final      Susceptibility   Escherichia coli - MIC*    AMPICILLIN <=2 SENSITIVE Sensitive     CEFAZOLIN <=4 SENSITIVE Sensitive     CEFTRIAXONE <=1 SENSITIVE Sensitive     CIPROFLOXACIN <=0.25 SENSITIVE Sensitive     GENTAMICIN 2 SENSITIVE Sensitive     IMIPENEM <=0.25 SENSITIVE Sensitive     NITROFURANTOIN 64 INTERMEDIATE Intermediate     TRIMETH/SULFA <=20 SENSITIVE Sensitive     AMPICILLIN/SULBACTAM <=2 SENSITIVE Sensitive     PIP/TAZO <=4 SENSITIVE Sensitive     Extended ESBL NEGATIVE Sensitive     * 10,000 COLONIES/mL ESCHERICHIA COLI  CULTURE, BLOOD (ROUTINE X 2) w Reflex to ID Panel     Status: None   Collection Time: 01/29/18  9:32 AM  Result Value Ref Range Status   Specimen Description BLOOD RIGH HAND  Final   Special Requests   Final    BOTTLES DRAWN AEROBIC  AND ANAEROBIC Blood Culture adequate volume   Culture   Final    NO GROWTH 5 DAYS Performed at [facility], [address] [place], [place] [address]    Report Status 02/03/2018 FINAL  Final  CULTURE, BLOOD (ROUTINE X 2) w Reflex to ID Panel     Status: None   Collection Time: 01/29/18  9:43 AM  Result Value Ref Range Status   Specimen Description BLOOD LEFT HAND  Final   Special Requests   Final    BOTTLES DRAWN AEROBIC AND ANAEROBIC Blood Culture adequate volume   Culture   Final    NO GROWTH 5 DAYS Performed at [facility], [address], [place], [place] [address]    Report Status 02/03/2018 FINAL  Final     Studies: Dg Chest 1 View  Result Date: 02/02/2018 CLINICAL DATA:  PICC line placement EXAM: CHEST  1 VIEW COMPARISON:  Chest x-rays from January 27, 2018 and January 29, 2018 FINDINGS: The nodularity in the left upper lobe seen on a CT scan of the neck in January of 2019 remains. The right PICC line terminates in the central SVC. No pneumothorax. Mild atelectasis in the left base. Cardiomegaly. The hila  and mediastinum are stable. No other acute abnormalities. Blunting of left costophrenic angle could represent a tiny effusion versus thickening. IMPRESSION: 1. The nodule worrisome for malignancy in the left upper lobe seen on the CT scan from January of 2019 is again identified. 2. The PICC line is in good position. 3. Probable small effusion with underlying atelectasis in the left base. Electronically Signed   By: Gerome Sam III M.D   On: 02/02/2018 19:53    Scheduled Meds: . apixaban  2.5 mg Oral BID  . atorvastatin  20 mg Oral q1800  . carbamide peroxide  5 drop Both EARS BID  . chlorhexidine  15 mL Mouth Rinse BID  . diltiazem  60 mg Oral Q12H  . famotidine  20 mg Oral Daily  . insulin aspart  0-5 Units Subcutaneous QHS  . insulin aspart  0-9 Units Subcutaneous TID WC  . insulin glargine  6 Units Subcutaneous QHS  . mouth rinse  15 mL  Mouth Rinse q12n4p  . metoprolol tartrate  75 mg Oral BID  . multivitamin with minerals  1 tablet Oral Daily  . multivitamin-lutein  1 capsule Oral Daily  . protein supplement shake  11 oz Oral BID BM  . sodium chloride flush  10 mL Intracatheter Q12H  . sodium chloride flush  10 mL Intravenous Q12H  . vitamin C  250 mg Oral BID   Continuous Infusions: . sodium chloride 75 mL/hr at 02/04/18 0506  . cefTRIAXone (ROCEPHIN)  IV 2 g (02/04/18 1106)    Assessment/Plan:  1. Severe sepsis secondary to UTI.  E. coli sepsis.  Continue Rocephin.  Infectious disease wrote antibiotics to be continued through 02/11/2018.  PICC line placed.  Urology brought to the OR for cystoscopy and stent insertion for bilateral hydronephrosis, urine still dark. 2. Anemia.  Today's hemoglobin down to 7.4.  Continue to monitor closely.  Will get a type and cross for tomorrow just in case transfusion needed. 3. Diabetic ketoacidosis has improved.  Placed on low-dose Lantus and sliding scale. 4. Acute kidney injury secondary to severe sepsis and infection.  Creatinine has improved.  Patient has underlying chronic kidney disease stage III. 5. Atrial fibrillation with rapid ventricular response.  On Eliquis.  On metoprolol and Cardizem for rate control as 6. Leukocytosis has improved 7. Hypertension on metoprolol 8. Hyperlipidemia on simvastatin  Code Status:     Code Status Orders  (From admission, onward)         Start     Ordered   01/27/18 1123  Full code  Continuous     01/27/18 1122        Code Status History    Date Active Date Inactive Code Status Order ID Comments User Context   05/28/2017 1637 06/06/2017 2002 Full Code [id number]  Altamese Dilling, MD ED     Disposition Plan: Patient now agreeable to rehab.  Needs insurance authorization for this.  Consultants:  Infectious disease  Nephrology  Urology   Antibiotics:  IV Rocephin  Time spent: 27 minutes   Tiffany Manning  [employer]

## 2018-02-04 NOTE — Clinical Social Work Note (Addendum)
Patient will need insurance authorization for SNF placement, CSW faxed required clinical information to insurance company, awaiting insurance authorization.  CSW presented bed offers to patient and she chose [facility] SNF, CSW contacted [facility], and they can accept patient once insurance authorization has been received.  CSW updated patient's husband, and he is in agreement, but would like to discuss with CSW tomorrow when he is in hospital.  CSW spoke with patient and her daughter, they would like to go to [facility] in [place] now.  CSW informed patient and family that CSW will have to speak with insurance company in the morning to see if they can change the facility.  Insurance approval for SNF has been received.    Ervin Knack. Adalberto Metzgar, MSW, Theresia Majors [phone]  02/04/2018 12:59 PM

## 2018-02-04 NOTE — Progress Notes (Addendum)
Per pt and husband at bedside, patient is willing to be dc to a facility for rehab instead of home.

## 2018-02-05 ENCOUNTER — Inpatient Hospital Stay: Payer: Medicare HMO

## 2018-02-05 LAB — CBC
HEMATOCRIT: 22.6 % — AB (ref 35.0–47.0)
HEMOGLOBIN: 7.6 g/dL — AB (ref 12.0–16.0)
MCH: 32.5 pg (ref 26.0–34.0)
MCHC: 33.5 g/dL (ref 32.0–36.0)
MCV: 97 fL (ref 80.0–100.0)
Platelets: 185 10*3/uL (ref 150–440)
RBC: 2.33 MIL/uL — ABNORMAL LOW (ref 3.80–5.20)
RDW: 15.8 % — ABNORMAL HIGH (ref 11.5–14.5)
WBC: 8.4 10*3/uL (ref 3.6–11.0)

## 2018-02-05 LAB — GLUCOSE, CAPILLARY
GLUCOSE-CAPILLARY: 76 mg/dL (ref 70–99)
GLUCOSE-CAPILLARY: 205 mg/dL — AB (ref 70–99)
Glucose-Capillary: 64 mg/dL — ABNORMAL LOW (ref 70–99)
Glucose-Capillary: 66 mg/dL — ABNORMAL LOW (ref 70–99)
Glucose-Capillary: 80 mg/dL (ref 70–99)
Glucose-Capillary: 83 mg/dL (ref 70–99)
Glucose-Capillary: 121 mg/dL — ABNORMAL HIGH (ref 70–99)
Glucose-Capillary: 206 mg/dL — ABNORMAL HIGH (ref 70–99)

## 2018-02-05 LAB — BASIC METABOLIC PANEL
ANION GAP: 8 (ref 5–15)
BUN: 12 mg/dL (ref 8–23)
CHLORIDE: 109 mmol/L (ref 98–111)
CO2: 20 mmol/L — AB (ref 22–32)
Calcium: 6.7 mg/dL — ABNORMAL LOW (ref 8.9–10.3)
Creatinine, Ser: 1.29 mg/dL — ABNORMAL HIGH (ref 0.44–1.00)
GFR calc Af Amer: 45 mL/min — ABNORMAL LOW (ref >60)
GFR calc non Af Amer: 39 mL/min — ABNORMAL LOW (ref >60)
GLUCOSE: 74 mg/dL (ref 70–99)
POTASSIUM: 3.4 mmol/L — AB (ref 3.5–5.1)
Sodium: 137 mmol/L (ref 135–145)

## 2018-02-05 LAB — TYPE AND SCREEN
ABO/RH(D): A POS
Antibody Screen: NEGATIVE

## 2018-02-05 LAB — PHOSPHORUS: PHOSPHORUS: 3 mg/dL (ref 2.5–4.6)

## 2018-02-05 LAB — MAGNESIUM: MAGNESIUM: 1.3 mg/dL — AB (ref 1.7–2.4)

## 2018-02-05 MED ORDER — BUDESONIDE 0.5 MG/2ML IN SUSP
0.5000 mg | Freq: Two times a day (BID) | RESPIRATORY_TRACT | Status: DC
  Administered 2018-02-05 – 2018-02-06 (×2): 0.5 mg via RESPIRATORY_TRACT
  Filled 2018-02-05 (×3): qty 2

## 2018-02-05 MED ORDER — IPRATROPIUM-ALBUTEROL 0.5-2.5 (3) MG/3ML IN SOLN
3.0000 mL | Freq: Four times a day (QID) | RESPIRATORY_TRACT | Status: DC
  Administered 2018-02-05 – 2018-02-06 (×2): 3 mL via RESPIRATORY_TRACT
  Filled 2018-02-05 (×5): qty 3

## 2018-02-05 MED ORDER — FUROSEMIDE 10 MG/ML IJ SOLN
40.0000 mg | Freq: Once | INTRAMUSCULAR | Status: AC
  Administered 2018-02-05: 40 mg via INTRAVENOUS
  Filled 2018-02-05: qty 4

## 2018-02-05 MED ORDER — SODIUM CHLORIDE 0.9% FLUSH: 10.0000 mL | INTRAVENOUS | Status: DC | PRN

## 2018-02-05 MED ORDER — POTASSIUM CHLORIDE CRYS ER 20 MEQ PO TBCR
40.0000 meq | EXTENDED_RELEASE_TABLET | Freq: Once | ORAL | Status: AC
  Administered 2018-02-05: 40 meq via ORAL
  Filled 2018-02-05: qty 2

## 2018-02-05 MED ORDER — SODIUM CHLORIDE 0.9% FLUSH
10.0000 mL | Freq: Two times a day (BID) | INTRAVENOUS | Status: DC
  Administered 2018-02-05 – 2018-02-06 (×2): 10 mL

## 2018-02-05 MED ORDER — APIXABAN 5 MG PO TABS
5.0000 mg | ORAL_TABLET | Freq: Two times a day (BID) | ORAL | Status: DC
  Administered 2018-02-05: 5 mg via ORAL
  Filled 2018-02-05: qty 1

## 2018-02-05 MED ORDER — MAGNESIUM SULFATE 2 GM/50ML IV SOLN
2.0000 g | Freq: Once | INTRAVENOUS | Status: AC
  Administered 2018-02-05: 2 g via INTRAVENOUS
  Filled 2018-02-05: qty 50

## 2018-02-05 MED ORDER — METHYLPREDNISOLONE SODIUM SUCC 40 MG IJ SOLR
40.0000 mg | Freq: Every day | INTRAMUSCULAR | Status: DC
  Administered 2018-02-05: 40 mg via INTRAVENOUS
  Filled 2018-02-05: qty 1

## 2018-02-05 MED ORDER — ALBUTEROL SULFATE (2.5 MG/3ML) 0.083% IN NEBU: 2.5000 mg | INHALATION_SOLUTION | RESPIRATORY_TRACT | Status: DC | PRN

## 2018-02-05 NOTE — Progress Notes (Signed)
PT Cancellation Note  Patient Details Name: Little Bashore MRN: [id number] DOB: June 15, 1940   Cancelled Treatment:    Reason Eval/Treat Not Completed: Medical issues which prohibited therapy. Treatment attempted, pt sleeping and per family was given medication for nausea. Family agreeable to attempt; however, with awakening attempts pt with coughing and some emesis. Pt returns to sleep after episode/care over. Unable to participate in any meaningful PT at this time. Re attempt tomorrow.    Scot Dock, PTA 02/05/2018, 3:53 PM

## 2018-02-05 NOTE — Progress Notes (Addendum)
Inpatient Diabetes Program Recommendations  AACE/ADA: New Consensus Statement on Inpatient Glycemic Control (2015)  Target Ranges:  Prepandial:   less than 140 mg/dL      Peak postprandial:   less than 180 mg/dL (1-2 hours)      Critically ill patients:  140 - 180 mg/dL   Results for AIREAL, SLATER (MRN [id number]) as of 02/05/2018 08:02  Ref. Range 02/04/2018 07:58 02/04/2018 12:07 02/04/2018 17:29 02/04/2018 21:03  Glucose-Capillary Latest Ref Range: 70 - 99 mg/dL 409 (H)   0 units NOVOLOG  166 (H)   2 units NOVOLOG  201 (H)  3 units NOVOLOG  101 (H)    6 units LANTUS   Results for DIEGO, DELANCEY (MRN [id number]) as of 02/05/2018 08:02  Ref. Range 02/05/2018 07:56  Glucose-Capillary Latest Ref Range: 70 - 99 mg/dL 64 (L)   Home DM Meds: Metaglip 5-500 mgonce a day every other day  Current Orders: Lantus 6 units QHS        Novolog Sensitive Correction Scale/ SSI (0-9 units) TID AC + HS     MD- Patient with Hypoglycemia this AM after getting 6 units Lantus last PM  Only takes oral DM medications at home  May consider d/c of Lantus for now    --Will follow patient during hospitalization--  Ambrose Finland RN, MSN, CDE Diabetes Coordinator Inpatient Glycemic Control Team Team Pager: [phone] (8a-5p)

## 2018-02-05 NOTE — Progress Notes (Signed)
[facility], [place] 02/05/18  Subjective:  Patient has been having some nausea and dry heaves today. Also has ongoing hematuria. Creatinine this a.m. Was 1.29.  Objective:  Vital signs in last 24 hours:  Temp:  [97.9 F (36.6 C)-98.5 F (36.9 C)] 97.9 F (36.6 C) (10/01 0423) Pulse Rate:  [56-80] 80 (10/01 1440) Resp:  [16-19] 16 (10/01 1440) BP: (101-116)/(58-60) 101/60 (10/01 0423) SpO2:  [97 %-100 %] 97 % (10/01 1440)  Weight change:  Filed Weights   01/27/18 0840 01/27/18 1122 02/01/18 0320  Weight: 54.4 kg 57.4 kg 62.1 kg    Intake/Output:    Intake/Output Summary (Last 24 hours) at 02/05/2018 1541 Last data filed at 02/05/2018 0539 Gross per 24 hour  Intake 786.64 ml  Output 350 ml  Net 436.64 ml     Physical Exam: General: Ill appearing  HEENT moist oral mucus membranes  Neck supple  Pulm/lungs Decreased breath sounds at bases, normal effort  CVS/Heart Irregular  Abdomen:  Soft, NTND, BS present  Extremities: trace edema  Neurologic: Awake, alert, follows commands  Skin: Warm, dry  GU: Foley in place, hematuria noted       Basic Metabolic Panel:  Recent Labs  Lab 01/30/18 0218 01/31/18 0454 02/01/18 0447 02/02/18 0549 02/03/18 0956 02/05/18 0632  NA 147* 146* 140 139 136 137  K 3.9 3.4* 2.9* 4.0 3.8 3.4*  CL 120* 118* 112* 112* 108 109  CO2 20* 20* 20* 20* 20* 20*  GLUCOSE 135* 84 88 98 185* 74  BUN 48* 38* 30* _0 CREATININE 1.96* 1.78* 1.37* 1.50* 1.31* 1.29*  CALCIUM 7.8* 7.6* 7.2* 7.2* 6.9* 6.7*  MG 2.0  --   --   --   --  1.3*  PHOS 2.4*  --   --   --   --  3.0     CBC: Recent Labs  Lab 01/31/18 0454 02/01/18 0447 02/02/18 0549 02/04/18 0614 02/05/18 0632  WBC 9.1 8.6 9.6 7.0 8.4  HGB 7.9* 8.1* 8.1* 7.4* 7.6*  HCT 24.0* 24.4* 24.4* 22.3* 22.6*  MCV 95.1 95.0 95.8 96.8 97.0  PLT 193 196 196 163 185     No results found for: HEPBSAG, HEPBSAB, HEPBIGM    Microbiology:  Recent  Results (from the past 240 hour(s))  Blood Culture (routine x 2)     Status: Abnormal   Collection Time: 01/27/18  8:40 AM  Result Value Ref Range Status   Specimen Description   Final    BLOOD RIGHT ANTECUBITAL Performed at [facility], [address], [place], [place] [address]    Special Requests   Final    BOTTLES DRAWN AEROBIC AND ANAEROBIC Blood Culture adequate volume Performed at [facility], [address] [place], [place] [address]    Culture  Setup Time   Final    AEROBIC BOTTLE ONLY GRAM POSITIVE COCCI CRITICAL RESULT CALLED TO, READ BACK BY AND VERIFIED WITH: MATT [facility] 01/28/18 0618 REC Performed at [facility], [address], [place], [place] [address]    Culture STAPHYLOCOCCUS HOMINIS (A)  Final   Report Status 01/30/2018 FINAL  Final   Organism ID, Bacteria STAPHYLOCOCCUS HOMINIS  Final      Susceptibility   Staphylococcus hominis - MIC*    CIPROFLOXACIN >=8 RESISTANT Resistant     ERYTHROMYCIN <=0.25 SENSITIVE Sensitive     GENTAMICIN <=0.5 SENSITIVE Sensitive     OXACILLIN >=4 RESISTANT Resistant  TETRACYCLINE >=16 RESISTANT Resistant     VANCOMYCIN <=0.5 SENSITIVE Sensitive     TRIMETH/SULFA >=320 RESISTANT Resistant     CLINDAMYCIN <=0.25 SENSITIVE Sensitive     RIFAMPIN <=0.5 SENSITIVE Sensitive     Inducible Clindamycin NEGATIVE Sensitive     * STAPHYLOCOCCUS HOMINIS  Blood Culture ID Panel (Reflexed)     Status: Abnormal   Collection Time: 01/27/18  8:40 AM  Result Value Ref Range Status   Enterococcus species NOT DETECTED NOT DETECTED Final   Listeria monocytogenes NOT DETECTED NOT DETECTED Final   Staphylococcus species DETECTED (A) NOT DETECTED Final    Comment: Methicillin (oxacillin) resistant coagulase negative staphylococcus. Possible blood culture contaminant (unless isolated from more than one blood culture draw or clinical case suggests pathogenicity). No antibiotic treatment is indicated for blood  culture  contaminants. CRITICAL RESULT CALLED TO, READ BACK BY AND VERIFIED WITH: MATT [facility]CB[facility] 01/28/18 0618 REC    Staphylococcus aureus NOT DETECTED NOT DETECTED Final   Methicillin resistance DETECTED (A) NOT DETECTED Final    Comment: CRITICAL RESULT CALLED TO, READ BACK BY AND VERIFIED WITH: MATT MCBANE 01/28/18 0618 REC    Streptococcus species NOT DETECTED NOT DETECTED Final   Streptococcus agalactiae NOT DETECTED NOT DETECTED Final   Streptococcus pneumoniae NOT DETECTED NOT DETECTED Final   Streptococcus pyogenes NOT DETECTED NOT DETECTED Final   Acinetobacter baumannii NOT DETECTED NOT DETECTED Final   Enterobacteriaceae species NOT DETECTED NOT DETECTED Final   Enterobacter cloacae complex NOT DETECTED NOT DETECTED Final   Escherichia coli NOT DETECTED NOT DETECTED Final   Klebsiella oxytoca NOT DETECTED NOT DETECTED Final   Klebsiella pneumoniae NOT DETECTED NOT DETECTED Final   Proteus species NOT DETECTED NOT DETECTED Final   Serratia marcescens NOT DETECTED NOT DETECTED Final   Haemophilus influenzae NOT DETECTED NOT DETECTED Final   Neisseria meningitidis NOT DETECTED NOT DETECTED Final   Pseudomonas aeruginosa NOT DETECTED NOT DETECTED Final   Candida albicans NOT DETECTED NOT DETECTED Final   Candida glabrata NOT DETECTED NOT DETECTED Final   Candida krusei NOT DETECTED NOT DETECTED Final   Candida parapsilosis NOT DETECTED NOT DETECTED Final   Candida tropicalis NOT DETECTED NOT DETECTED Final    Comment: Performed at [facility], [address] [place], [place] [address]  Blood Culture (routine x 2)     Status: Abnormal   Collection Time: 01/27/18  8:46 AM  Result Value Ref Range Status   Specimen Description   Final    BLOOD LEFT ANTECUBITAL Performed at [facility], [address], [place], [place] [address]    Special Requests   Final    BOTTLES DRAWN AEROBIC AND ANAEROBIC Blood Culture adequate volume Performed at [facility], [address] [place], [place] [address]    Culture  Setup Time   Final    GRAM POSITIVE COCCI AEROBIC BOTTLE ONLY CRITICAL VALUE NOTED.  VALUE IS CONSISTENT WITH PREVIOUSLY REPORTED AND CALLED VALUE. Performed at [facility], [address] [place], [place] [address]    Culture (A)  Final    STAPHYLOCOCCUS CAPITIS THE SIGNIFICANCE OF ISOLATING THIS ORGANISM FROM A SINGLE SET OF BLOOD CULTURES WHEN MULTIPLE SETS ARE DRAWN IS UNCERTAIN. PLEASE NOTIFY THE MICROBIOLOGY DEPARTMENT WITHIN ONE WEEK IF SPECIATION AND SENSITIVITIES ARE REQUIRED. Performed at [facility], [place] [address], [place], [place] [address]    Report Status 01/30/2018 FINAL  Final  Urine Culture     Status: Abnormal  Collection Time: 01/27/18  9:45 AM  Result Value Ref Range Status   Specimen Description   Final    URINE, CATHETERIZED Performed at [facility], [address] [place], [place] [address]    Special Requests   Final    NONE Performed at [facility], [address] [place], [place] [address]    Culture >=100,000 COLONIES/mL ESCHERICHIA COLI (A)  Final   Report Status 01/30/2018 FINAL  Final   Organism ID, Bacteria ESCHERICHIA COLI (A)  Final      Susceptibility   Escherichia coli - MIC*    AMPICILLIN 16 INTERMEDIATE Intermediate     CEFAZOLIN <=4 SENSITIVE Sensitive     CEFTRIAXONE <=1 SENSITIVE Sensitive     CIPROFLOXACIN <=0.25 SENSITIVE Sensitive     GENTAMICIN <=1 SENSITIVE Sensitive     IMIPENEM <=0.25 SENSITIVE Sensitive     NITROFURANTOIN 32 SENSITIVE Sensitive     TRIMETH/SULFA <=20 SENSITIVE Sensitive     AMPICILLIN/SULBACTAM 8 SENSITIVE Sensitive     PIP/TAZO <=4 SENSITIVE Sensitive     Extended ESBL NEGATIVE Sensitive     * >=100,000 COLONIES/mL ESCHERICHIA COLI  MRSA PCR Screening     Status: None   Collection Time: 01/27/18 11:25 AM  Result Value Ref Range Status   MRSA by PCR NEGATIVE NEGATIVE Final    Comment:        The GeneXpert MRSA  Assay (FDA approved for NASAL specimens only), is one component of a comprehensive MRSA colonization surveillance program. It is not intended to diagnose MRSA infection nor to guide or monitor treatment for MRSA infections. Performed at [facility], [address] [place], [place] [address]   C difficile quick scan w PCR reflex     Status: None   Collection Time: 01/28/18  1:14 PM  Result Value Ref Range Status   C Diff antigen NEGATIVE NEGATIVE Final   C Diff toxin NEGATIVE NEGATIVE Final   C Diff interpretation No C. difficile detected.  Final    Comment: Performed at [facility], [address], [place], [place] [address]  Urine Culture     Status: None   Collection Time: 01/28/18  6:31 PM  Result Value Ref Range Status   Specimen Description   Final    URINE, RANDOM Performed at [facility], [address] [place], [place] [address]    Special Requests RIGHT RENAL PELVIS  Final   Culture   Final    NO GROWTH Performed at [facility], [place] [address], [place], [place] [address]    Report Status 01/30/2018 FINAL  Final  Urine Culture     Status: Abnormal   Collection Time: 01/28/18  6:33 PM  Result Value Ref Range Status   Specimen Description   Final    URINE, RANDOM Performed at [facility], [address] [place], [place] [address]    Special Requests   Final    LEFT KIDNEY Performed at [facility], [place] [address] [place], [place] [address]    Culture 10,000 COLONIES/mL ESCHERICHIA COLI (A)  Final   Report Status 01/31/2018 FINAL  Final   Organism ID, Bacteria ESCHERICHIA COLI (A)  Final      Susceptibility   Escherichia coli - MIC*    AMPICILLIN <=2 SENSITIVE Sensitive     CEFAZOLIN <=4 SENSITIVE Sensitive     CEFTRIAXONE <=1 SENSITIVE Sensitive     CIPROFLOXACIN <=0.25 SENSITIVE Sensitive  GENTAMICIN 2 SENSITIVE Sensitive     IMIPENEM <=0.25 SENSITIVE Sensitive     NITROFURANTOIN 64  INTERMEDIATE Intermediate     TRIMETH/SULFA <=20 SENSITIVE Sensitive     AMPICILLIN/SULBACTAM <=2 SENSITIVE Sensitive     PIP/TAZO <=4 SENSITIVE Sensitive     Extended ESBL NEGATIVE Sensitive     * 10,000 COLONIES/mL ESCHERICHIA COLI  CULTURE, BLOOD (ROUTINE X 2) w Reflex to ID Panel     Status: None   Collection Time: 01/29/18  9:32 AM  Result Value Ref Range Status   Specimen Description BLOOD RIGH HAND  Final   Special Requests   Final    BOTTLES DRAWN AEROBIC AND ANAEROBIC Blood Culture adequate volume   Culture   Final    NO GROWTH 5 DAYS Performed at [facility], [address] [place], [place] [address]    Report Status 02/03/2018 FINAL  Final  CULTURE, BLOOD (ROUTINE X 2) w Reflex to ID Panel     Status: None   Collection Time: 01/29/18  9:43 AM  Result Value Ref Range Status   Specimen Description BLOOD LEFT HAND  Final   Special Requests   Final    BOTTLES DRAWN AEROBIC AND ANAEROBIC Blood Culture adequate volume   Culture   Final    NO GROWTH 5 DAYS Performed at [facility], [address] [place], [place] [address]    Report Status 02/03/2018 FINAL  Final    Coagulation Studies: No results for input(s): LABPROT, INR in the last 72 hours.  Urinalysis: No results for input(s): COLORURINE, LABSPEC, PHURINE, GLUCOSEU, HGBUR, BILIRUBINUR, KETONESUR, PROTEINUR, UROBILINOGEN, NITRITE, LEUKOCYTESUR in the last 72 hours.  Invalid input(s): APPERANCEUR    Imaging: Dg Chest Port 1 View  Result Date: 02/05/2018 CLINICAL DATA:  Cough EXAM: PORTABLE CHEST 1 VIEW COMPARISON:  02/02/2018 FINDINGS: Right PICC line tip at the cavoatrial junction. Cardiomegaly with vascular congestion. Increasing left lower lobe atelectasis or infiltrate and small left effusion. IMPRESSION: Increasing left basilar atelectasis or consolidation/pneumonia. Small left effusion. Cardiomegaly, vascular congestion. Electronically Signed   By: Rolm Baptise M.D.   On: 02/05/2018  10:16     Medications:   . cefTRIAXone (ROCEPHIN)  IV 2 g (02/05/18 1109)  . magnesium sulfate 1 - 4 g bolus IVPB     . atorvastatin  20 mg Oral q1800  . budesonide (PULMICORT) nebulizer solution  0.5 mg Nebulization BID  . carbamide peroxide  5 drop Both EARS BID  . chlorhexidine  15 mL Mouth Rinse BID  . diltiazem  60 mg Oral Q12H  . famotidine  20 mg Oral Daily  . insulin aspart  0-5 Units Subcutaneous QHS  . insulin aspart  0-9 Units Subcutaneous TID WC  . insulin glargine  6 Units Subcutaneous QHS  . ipratropium-albuterol  3 mL Nebulization Q6H  . mouth rinse  15 mL Mouth Rinse q12n4p  . methylPREDNISolone (SOLU-MEDROL) injection  40 mg Intravenous Daily  . metoprolol tartrate  75 mg Oral BID  . multivitamin with minerals  1 tablet Oral Daily  . multivitamin-lutein  1 capsule Oral Daily  . potassium chloride  40 mEq Oral Once  . protein supplement shake  11 oz Oral BID BM  . sodium chloride flush  10 mL Intracatheter Q12H  . sodium chloride flush  10 mL Intravenous Q12H  . sodium chloride flush  10-40 mL Intracatheter Q12H  . vitamin C  250 mg Oral BID   acetaminophen **OR** acetaminophen, albuterol, ondansetron **OR**  ondansetron (ZOFRAN) IV, promethazine, sodium chloride flush, traMADol  Assessment/ Plan:  77 y.o. caucasian female with medical problems of Diabetes, HTN, HLD, salivary gland stones who was admitted to [facility] on 01/27/2018 for evaluation of generalized fatigue and is Dx with severe sepsis, UTI and ARF.  1. ARF, multifactorial 2. B/l hydronephrosis- b/l ureteral stent placed 9/23 by Dr Jeffie Pollock 3. Sepsis, Gram positive bacteremia  9/22, negative cultures 9/24 4. Acidosis,  Hypernatremia, Hypokalemia 5. UTI- E Coli 6. CKD st 3, Baseline Cr 1.38/ GFR 36 from Jan 2019 7. DM-2 with CKD 8. New onset A Fib 9. Enlarging LUL nodule 10. 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 Patient is s/p b/l ureteral stent placement.  Foley kept in place to avoid hydronephrosis as well.   Plan:  Renal function continues to improve.  Creatinine currently 1.29 with an EGFR of 39.  She still has some ongoing hematuria.  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  Patient taken off of anticoagulation.  Continue periodic to monitor renal parameters.  She will need outpatient urology followup as well.  If hemoglobin worsens consider blood transfusion.    LOS: 9 Maryhelen Lindler 10/1/20193:41 PM  [place], [place]  Note: This note was prepared with Dragon dictation. Any transcription errors are unintentional

## 2018-02-05 NOTE — Progress Notes (Signed)
Pt refused 2000 hr tx

## 2018-02-05 NOTE — Progress Notes (Signed)
Patient ID: Tiffany Manning, female   DOB: 1941-04-22, 77 y.o.   MRN: [id number]  [employer] PROGRESS NOTE  Tiffany Manning WUJ:[id number] DOB: 12-17-1940 DOA: 01/27/2018 PCP: Jaclyn Shaggy, MD  HPI/Subjective: Patient not feeling good today.  Complains of a dry heaving and nausea vomiting.  Objective: Vitals:   02/04/18 1928 02/05/18 0423  BP: (!) 113/58 101/60  Pulse: (!) 56 75  Resp:    Temp: 98.3 F (36.8 C) 97.9 F (36.6 C)  SpO2: 100% 99%    Filed Weights   01/27/18 0840 01/27/18 1122 02/01/18 0320  Weight: 54.4 kg 57.4 kg 62.1 kg    ROS: Review of Systems  Constitutional: Positive for malaise/fatigue. Negative for chills and fever.  Eyes: Negative for blurred vision.  Respiratory: Positive for cough and shortness of breath.   Cardiovascular: Negative for chest pain.  Gastrointestinal: Positive for nausea and vomiting. Negative for abdominal pain, constipation and diarrhea.  Genitourinary: Positive for hematuria. Negative for dysuria.  Musculoskeletal: Negative for joint pain.  Neurological: Negative for dizziness and headaches.   Exam: Physical Exam  Constitutional: She is oriented to person, place, and time.  HENT:  Nose: No mucosal edema.  Mouth/Throat: No oropharyngeal exudate or posterior oropharyngeal edema.  Eyes: Pupils are equal, round, and reactive to light. Conjunctivae, EOM and lids are normal.  Neck: No JVD present. Carotid bruit is not present. No edema present. No thyroid mass and no thyromegaly present.  Cardiovascular: S1 normal and S2 normal. Exam reveals no gallop.  No murmur heard. Pulses:      Dorsalis pedis pulses are 2+ on the right side, and 2+ on the left side.  Respiratory: No respiratory distress. She has no wheezes. She has no rhonchi. She has no rales.  GI: Soft. Bowel sounds are normal. There is no tenderness.  Musculoskeletal:       Right ankle: She exhibits no swelling.       Left ankle: She exhibits no swelling.   Lymphadenopathy:    She has no cervical adenopathy.  Neurological: She is alert and oriented to person, place, and time. No cranial nerve deficit.  Skin: Skin is warm. No rash noted. Nails show no clubbing.  Psychiatric: She has a normal mood and affect.      Data Reviewed: Basic Metabolic Panel: Recent Labs  Lab 01/30/18 0218 01/31/18 0454 02/01/18 0447 02/02/18 0549 02/03/18 0956 02/05/18 0632  NA 147* 146* 140 139 136 137  K 3.9 3.4* 2.9* 4.0 3.8 3.4*  CL 120* 118* 112* 112* 108 109  CO2 20* 20* 20* 20* 20* 20*  GLUCOSE 135* 84 88 98 185* 74  BUN 48* 38* 30* 23 19 12   CREATININE 1.96* 1.78* 1.37* 1.50* 1.31* 1.29*  CALCIUM 7.8* 7.6* 7.2* 7.2* 6.9* 6.7*  MG 2.0  --   --   --   --  1.3*  PHOS 2.4*  --   --   --   --  3.0   CBC: Recent Labs  Lab 01/31/18 0454 02/01/18 0447 02/02/18 0549 02/04/18 0614 02/05/18 0632  WBC 9.1 8.6 9.6 7.0 8.4  HGB 7.9* 8.1* 8.1* 7.4* 7.6*  HCT 24.0* 24.4* 24.4* 22.3* 22.6*  MCV 95.1 95.0 95.8 96.8 97.0  PLT 193 196 196 163 185   CBG: Recent Labs  Lab 02/05/18 0248 02/05/18 0756 02/05/18 0820 02/05/18 0846 02/05/18 1201  GLUCAP 76 64* 66* 80 83    Recent Results (from the past 240 hour(s))  Blood Culture (routine x 2)  Status: Abnormal   Collection Time: 01/27/18  8:40 AM  Result Value Ref Range Status   Specimen Description   Final    BLOOD RIGHT ANTECUBITAL Performed at [facility], [address], [place], [place] [address]    Special Requests   Final    BOTTLES DRAWN AEROBIC AND ANAEROBIC Blood Culture adequate volume Performed at [facility], [address] [place], [place] [address]    Culture  Setup Time   Final    AEROBIC BOTTLE ONLY GRAM POSITIVE COCCI CRITICAL RESULT CALLED TO, READ BACK BY AND VERIFIED WITH: MATT [facility] 01/28/18 0618 REC Performed at [facility], [address] [place], [place] [address]    Culture STAPHYLOCOCCUS HOMINIS (A)  Final   Report Status  01/30/2018 FINAL  Final   Organism ID, Bacteria STAPHYLOCOCCUS HOMINIS  Final      Susceptibility   Staphylococcus hominis - MIC*    CIPROFLOXACIN >=8 RESISTANT Resistant     ERYTHROMYCIN <=0.25 SENSITIVE Sensitive     GENTAMICIN <=0.5 SENSITIVE Sensitive     OXACILLIN >=4 RESISTANT Resistant     TETRACYCLINE >=16 RESISTANT Resistant     VANCOMYCIN <=0.5 SENSITIVE Sensitive     TRIMETH/SULFA >=320 RESISTANT Resistant     CLINDAMYCIN <=0.25 SENSITIVE Sensitive     RIFAMPIN <=0.5 SENSITIVE Sensitive     Inducible Clindamycin NEGATIVE Sensitive     * STAPHYLOCOCCUS HOMINIS  Blood Culture ID Panel (Reflexed)     Status: Abnormal   Collection Time: 01/27/18  8:40 AM  Result Value Ref Range Status   Enterococcus species NOT DETECTED NOT DETECTED Final   Listeria monocytogenes NOT DETECTED NOT DETECTED Final   Staphylococcus species DETECTED (A) NOT DETECTED Final    Comment: Methicillin (oxacillin) resistant coagulase negative staphylococcus. Possible blood culture contaminant (unless isolated from more than one blood culture draw or clinical case suggests pathogenicity). No antibiotic treatment is indicated for blood  culture contaminants. CRITICAL RESULT CALLED TO, READ BACK BY AND VERIFIED WITH: MATT [facility] 01/28/18 0618 REC    Staphylococcus aureus NOT DETECTED NOT DETECTED Final   Methicillin resistance DETECTED (A) NOT DETECTED Final    Comment: CRITICAL RESULT CALLED TO, READ BACK BY AND VERIFIED WITH: MATT [facility] 01/28/18 0618 REC    Streptococcus species NOT DETECTED NOT DETECTED Final   Streptococcus agalactiae NOT DETECTED NOT DETECTED Final   Streptococcus pneumoniae NOT DETECTED NOT DETECTED Final   Streptococcus pyogenes NOT DETECTED NOT DETECTED Final   Acinetobacter baumannii NOT DETECTED NOT DETECTED Final   Enterobacteriaceae species NOT DETECTED NOT DETECTED Final   Enterobacter cloacae complex NOT DETECTED NOT DETECTED Final   Escherichia coli NOT DETECTED NOT  DETECTED Final   Klebsiella oxytoca NOT DETECTED NOT DETECTED Final   Klebsiella pneumoniae NOT DETECTED NOT DETECTED Final   Proteus species NOT DETECTED NOT DETECTED Final   Serratia marcescens NOT DETECTED NOT DETECTED Final   Haemophilus influenzae NOT DETECTED NOT DETECTED Final   Neisseria meningitidis NOT DETECTED NOT DETECTED Final   Pseudomonas aeruginosa NOT DETECTED NOT DETECTED Final   Candida albicans NOT DETECTED NOT DETECTED Final   Candida glabrata NOT DETECTED NOT DETECTED Final   Candida krusei NOT DETECTED NOT DETECTED Final   Candida parapsilosis NOT DETECTED NOT DETECTED Final   Candida tropicalis NOT DETECTED NOT DETECTED Final    Comment: Performed at [facility], [address] [place], [place] [address]  Blood Culture (routine x 2)     Status: Abnormal  Collection Time: 01/27/18  8:46 AM  Result Value Ref Range Status   Specimen Description   Final    BLOOD LEFT ANTECUBITAL Performed at [facility], [address], [place], [place] [address]    Special Requests   Final    BOTTLES DRAWN AEROBIC AND ANAEROBIC Blood Culture adequate volume Performed at [facility], [address] [place], [place] [address]    Culture  Setup Time   Final    GRAM POSITIVE COCCI AEROBIC BOTTLE ONLY CRITICAL VALUE NOTED.  VALUE IS CONSISTENT WITH PREVIOUSLY REPORTED AND CALLED VALUE. Performed at [facility], [address] [place], [place] [address]    Culture (A)  Final    STAPHYLOCOCCUS CAPITIS THE SIGNIFICANCE OF ISOLATING THIS ORGANISM FROM A SINGLE SET OF BLOOD CULTURES WHEN MULTIPLE SETS ARE DRAWN IS UNCERTAIN. PLEASE NOTIFY THE MICROBIOLOGY DEPARTMENT WITHIN ONE WEEK IF SPECIATION AND SENSITIVITIES ARE REQUIRED. Performed at [facility], [address] [place], [place] [address]    Report Status 01/30/2018 FINAL  Final  Urine Culture     Status: Abnormal   Collection Time: 01/27/18  9:45 AM  Result Value Ref Range  Status   Specimen Description   Final    URINE, CATHETERIZED Performed at [facility], [address] [place], [place] [address]    Special Requests   Final    NONE Performed at [facility], [address] [place], [place] [address]    Culture >=100,000 COLONIES/mL ESCHERICHIA COLI (A)  Final   Report Status 01/30/2018 FINAL  Final   Organism ID, Bacteria ESCHERICHIA COLI (A)  Final      Susceptibility   Escherichia coli - MIC*    AMPICILLIN 16 INTERMEDIATE Intermediate     CEFAZOLIN <=4 SENSITIVE Sensitive     CEFTRIAXONE <=1 SENSITIVE Sensitive     CIPROFLOXACIN <=0.25 SENSITIVE Sensitive     GENTAMICIN <=1 SENSITIVE Sensitive     IMIPENEM <=0.25 SENSITIVE Sensitive     NITROFURANTOIN 32 SENSITIVE Sensitive     TRIMETH/SULFA <=20 SENSITIVE Sensitive     AMPICILLIN/SULBACTAM 8 SENSITIVE Sensitive     PIP/TAZO <=4 SENSITIVE Sensitive     Extended ESBL NEGATIVE Sensitive     * >=100,000 COLONIES/mL ESCHERICHIA COLI  MRSA PCR Screening     Status: None   Collection Time: 01/27/18 11:25 AM  Result Value Ref Range Status   MRSA by PCR NEGATIVE NEGATIVE Final    Comment:        The GeneXpert MRSA Assay (FDA approved for NASAL specimens only), is one component of a comprehensive MRSA colonization surveillance program. It is not intended to diagnose MRSA infection nor to guide or monitor treatment for MRSA infections. Performed at [facility], [address] [place], [place] [address]   C difficile quick scan w PCR reflex     Status: None   Collection Time: 01/28/18  1:14 PM  Result Value Ref Range Status   C Diff antigen NEGATIVE NEGATIVE Final   C Diff toxin NEGATIVE NEGATIVE Final   C Diff interpretation No C. difficile detected.  Final    Comment: Performed at [facility], [address] [place], [place] [address]  Urine Culture     Status: None   Collection Time: 01/28/18  6:31 PM  Result Value Ref Range Status    Specimen Description   Final    URINE, RANDOM Performed at [facility], [address], [place], [place]  [address]    Special Requests RIGHT RENAL PELVIS  Final   Culture   Final    NO GROWTH Performed at [facility], [address] [place], [place] [address]    Report Status 01/30/2018 FINAL  Final  Urine Culture     Status: Abnormal   Collection Time: 01/28/18  6:33 PM  Result Value Ref Range Status   Specimen Description   Final    URINE, RANDOM Performed at [facility], [address], [place], [place] [address]    Special Requests   Final    LEFT KIDNEY Performed at [facility], [address], [place], [place] [address]    Culture 10,000 COLONIES/mL ESCHERICHIA COLI (A)  Final   Report Status 01/31/2018 FINAL  Final   Organism ID, Bacteria ESCHERICHIA COLI (A)  Final      Susceptibility   Escherichia coli - MIC*    AMPICILLIN <=2 SENSITIVE Sensitive     CEFAZOLIN <=4 SENSITIVE Sensitive     CEFTRIAXONE <=1 SENSITIVE Sensitive     CIPROFLOXACIN <=0.25 SENSITIVE Sensitive     GENTAMICIN 2 SENSITIVE Sensitive     IMIPENEM <=0.25 SENSITIVE Sensitive     NITROFURANTOIN 64 INTERMEDIATE Intermediate     TRIMETH/SULFA <=20 SENSITIVE Sensitive     AMPICILLIN/SULBACTAM <=2 SENSITIVE Sensitive     PIP/TAZO <=4 SENSITIVE Sensitive     Extended ESBL NEGATIVE Sensitive     * 10,000 COLONIES/mL ESCHERICHIA COLI  CULTURE, BLOOD (ROUTINE X 2) w Reflex to ID Panel     Status: None   Collection Time: 01/29/18  9:32 AM  Result Value Ref Range Status   Specimen Description BLOOD RIGH HAND  Final   Special Requests   Final    BOTTLES DRAWN AEROBIC AND ANAEROBIC Blood Culture adequate volume   Culture   Final    NO GROWTH 5 DAYS Performed at [facility], [address] [place], [place] [address]    Report Status 02/03/2018 FINAL  Final  CULTURE, BLOOD (ROUTINE X 2) w Reflex to ID Panel     Status: None   Collection Time: 01/29/18  9:43  AM  Result Value Ref Range Status   Specimen Description BLOOD LEFT HAND  Final   Special Requests   Final    BOTTLES DRAWN AEROBIC AND ANAEROBIC Blood Culture adequate volume   Culture   Final    NO GROWTH 5 DAYS Performed at [facility], [address], [place], [place] [address]    Report Status 02/03/2018 FINAL  Final     Studies: Dg Chest Port 1 View  Result Date: 02/05/2018 CLINICAL DATA:  Cough EXAM: PORTABLE CHEST 1 VIEW COMPARISON:  02/02/2018 FINDINGS: Right PICC line tip at the cavoatrial junction. Cardiomegaly with vascular congestion. Increasing left lower lobe atelectasis or infiltrate and small left effusion. IMPRESSION: Increasing left basilar atelectasis or consolidation/pneumonia. Small left effusion. Cardiomegaly, vascular congestion. Electronically Signed   By: Charlett Nose M.D.   On: 02/05/2018 10:16    Scheduled Meds: . atorvastatin  20 mg Oral q1800  . budesonide (PULMICORT) nebulizer solution  0.5 mg Nebulization BID  . carbamide peroxide  5 drop Both EARS BID  . chlorhexidine  15 mL Mouth Rinse BID  . diltiazem  60 mg Oral Q12H  . famotidine  20 mg Oral Daily  . insulin aspart  0-5 Units Subcutaneous QHS  . insulin aspart  0-9 Units Subcutaneous TID WC  . insulin glargine  6 Units Subcutaneous QHS  . ipratropium-albuterol  3 mL Nebulization Q6H  . mouth rinse  15 mL Mouth Rinse q12n4p  . methylPREDNISolone (SOLU-MEDROL) injection  40 mg Intravenous Daily  . metoprolol tartrate  75 mg Oral BID  . multivitamin with minerals  1 tablet Oral Daily  . multivitamin-lutein  1 capsule Oral Daily  . protein supplement shake  11 oz Oral BID BM  . sodium chloride flush  10 mL Intracatheter Q12H  . sodium chloride flush  10 mL Intravenous Q12H  . sodium chloride flush  10-40 mL Intracatheter Q12H  . vitamin C  250 mg Oral BID   Continuous Infusions: . cefTRIAXone (ROCEPHIN)  IV 2 g (02/05/18 1109)    Assessment/Plan:  1. Severe sepsis secondary to  UTI.  E. coli sepsis.  Continue Rocephin.  Infectious disease wrote antibiotics to be continued through 02/11/2018.  PICC line placed.  Urology brought to the OR for cystoscopy and stent insertion for bilateral hydronephrosis, urine still dark. 2. Nausea vomiting and dry heaving.  Conservative management with nausea medications. 3. Cough and shortness of breath.  Start Solu-Medrol and nebulizer treatments.  Give a dose of Lasix for fluid overload. 4. Anemia.  Today's hemoglobin down to 7.6, with the patient's dark looking urine I will get rid of the Eliquis for now.  Continue to monitor. 5. Hypoglycemia with diabetes with the patient's nausea vomiting.  Discontinue Lantus and just put on sliding scale.   6. Acute kidney injury secondary to severe sepsis and infection.  Creatinine has improved.  Patient has underlying chronic kidney disease stage III. 7. Atrial fibrillation with rapid ventricular response.  Stopping Eliquis.  Risk of stroke higher being off Eliquis but since the patient has drop in hemoglobin I will hold on the Eliquis at this point..  On metoprolol and Cardizem for rate control. 8. Leukocytosis has improved 9. Hypertension on metoprolol 10. Hyperlipidemia on simvastatin  Code Status:     Code Status Orders  (From admission, onward)         Start     Ordered   01/27/18 1123  Full code  Continuous     01/27/18 1122        Code Status History    Date Active Date Inactive Code Status Order ID Comments User Context   05/28/2017 1637 06/06/2017 2002 Full Code [id number]  Altamese Dilling, MD ED     Disposition Plan: Continue to monitor closely  Consultants:  Infectious disease  Nephrology  Urology   Antibiotics:  IV Rocephin  Time spent: 28 minutes Husband at bedside   [employer]

## 2018-02-05 NOTE — Progress Notes (Signed)
IS education attempted, pt sleeping. Spouse asked to come back later.

## 2018-02-05 NOTE — Progress Notes (Signed)
IS education attempted, pt sleeping. Spouse asked to try again later

## 2018-02-05 NOTE — Care Management (Signed)
Patient began dry heaving and coughing this morning.  CXR ordered and will begin steroids per Dr. Renae Gloss.

## 2018-02-06 LAB — BASIC METABOLIC PANEL
Anion gap: 9 (ref 5–15)
BUN: 12 mg/dL (ref 8–23)
CALCIUM: 7.3 mg/dL — AB (ref 8.9–10.3)
CO2: 22 mmol/L (ref 22–32)
CREATININE: 1.22 mg/dL — AB (ref 0.44–1.00)
Chloride: 105 mmol/L (ref 98–111)
GFR calc Af Amer: 48 mL/min — ABNORMAL LOW (ref >60)
GFR calc non Af Amer: 41 mL/min — ABNORMAL LOW (ref >60)
Glucose, Bld: 197 mg/dL — ABNORMAL HIGH (ref 70–99)
Potassium: 4.1 mmol/L (ref 3.5–5.1)
SODIUM: 136 mmol/L (ref 135–145)

## 2018-02-06 LAB — CBC
HCT: 24.9 % — ABNORMAL LOW (ref 35.0–47.0)
Hemoglobin: 8.4 g/dL — ABNORMAL LOW (ref 12.0–16.0)
MCH: 32.3 pg (ref 26.0–34.0)
MCHC: 33.5 g/dL (ref 32.0–36.0)
MCV: 96.2 fL (ref 80.0–100.0)
PLATELETS: 196 10*3/uL (ref 150–440)
RBC: 2.59 MIL/uL — ABNORMAL LOW (ref 3.80–5.20)
RDW: 15.5 % — AB (ref 11.5–14.5)
WBC: 5.2 10*3/uL (ref 3.6–11.0)

## 2018-02-06 LAB — GLUCOSE, CAPILLARY
Glucose-Capillary: 183 mg/dL — ABNORMAL HIGH (ref 70–99)
Glucose-Capillary: 221 mg/dL — ABNORMAL HIGH (ref 70–99)
Glucose-Capillary: 343 mg/dL — ABNORMAL HIGH (ref 70–99)

## 2018-02-06 MED ORDER — ACETAMINOPHEN 325 MG PO TABS: 650.0000 mg | ORAL_TABLET | Freq: Four times a day (QID) | ORAL | Status: AC | PRN

## 2018-02-06 MED ORDER — FUROSEMIDE 10 MG/ML IJ SOLN
40.0000 mg | Freq: Once | INTRAMUSCULAR | Status: AC
  Administered 2018-02-06: 40 mg via INTRAVENOUS
  Filled 2018-02-06: qty 4

## 2018-02-06 MED ORDER — ATORVASTATIN CALCIUM 20 MG PO TABS: 20.0000 mg | ORAL_TABLET | Freq: Every day | ORAL | 0 refills | Status: DC

## 2018-02-06 MED ORDER — CARBAMIDE PEROXIDE 6.5 % OT SOLN: 5.0000 [drp] | Freq: Two times a day (BID) | OTIC | 0 refills | Status: AC

## 2018-02-06 MED ORDER — CEFTRIAXONE IV (FOR PTA / DISCHARGE USE ONLY): 2.0000 g | INTRAVENOUS | 0 refills | Status: AC

## 2018-02-06 MED ORDER — ONDANSETRON HCL 4 MG PO TABS: 4.0000 mg | ORAL_TABLET | Freq: Three times a day (TID) | ORAL | 0 refills | Status: DC | PRN

## 2018-02-06 MED ORDER — GLIPIZIDE 5 MG PO TABS: 2.5000 mg | ORAL_TABLET | Freq: Every day | ORAL | 11 refills | Status: DC

## 2018-02-06 MED ORDER — ASCORBIC ACID 250 MG PO TABS: 250.0000 mg | ORAL_TABLET | Freq: Two times a day (BID) | ORAL | 0 refills | Status: DC

## 2018-02-06 MED ORDER — IPRATROPIUM-ALBUTEROL 0.5-2.5 (3) MG/3ML IN SOLN: 3.0000 mL | Freq: Four times a day (QID) | RESPIRATORY_TRACT | 0 refills | Status: DC

## 2018-02-06 MED ORDER — DILTIAZEM HCL ER 60 MG PO CP12: 60.0000 mg | ORAL_CAPSULE | Freq: Two times a day (BID) | ORAL | 0 refills | Status: DC

## 2018-02-06 MED ORDER — PREMIER PROTEIN SHAKE: 11.0000 [oz_av] | Freq: Two times a day (BID) | ORAL | 0 refills | Status: DC

## 2018-02-06 MED ORDER — FAMOTIDINE 20 MG PO TABS: 20.0000 mg | ORAL_TABLET | Freq: Every day | ORAL | 0 refills | Status: DC

## 2018-02-06 MED ORDER — SODIUM CHLORIDE 0.9% FLUSH: 10.0000 mL | Freq: Two times a day (BID) | INTRAVENOUS | Status: DC

## 2018-02-06 MED ORDER — METOPROLOL TARTRATE 75 MG PO TABS: 75.0000 mg | ORAL_TABLET | Freq: Two times a day (BID) | ORAL | 0 refills | Status: DC

## 2018-02-06 MED ORDER — INSULIN ASPART 100 UNIT/ML ~~LOC~~ SOLN: 0.0000 [IU] | Freq: Every day | SUBCUTANEOUS | 11 refills | Status: DC

## 2018-02-06 MED ORDER — INSULIN ASPART 100 UNIT/ML ~~LOC~~ SOLN: 0.0000 [IU] | Freq: Three times a day (TID) | SUBCUTANEOUS | 11 refills | Status: DC

## 2018-02-06 MED ORDER — SODIUM CHLORIDE 0.9 % IV SOLN: 2.0000 g | INTRAVENOUS | 0 refills | Status: DC

## 2018-02-06 NOTE — Clinical Social Work Placement (Signed)
   CLINICAL SOCIAL WORK PLACEMENT  NOTE  Date:  02/06/2018  Patient Details  Name: Tiffany Manning MRN: [id number] Date of Birth: [birth year]  Clinical Social Work is seeking post-discharge placement for this patient at the Skilled  Nursing Facility level of care (*CSW will initial, date and re-position this form in  chart as items are completed):  Yes   Patient/family provided with [facility] Clinical Social Work Department's list of facilities offering this level of care within the geographic area requested by the patient (or if unable, by the patient's family).  Yes   Patient/family informed of their freedom to choose among providers that offer the needed level of care, that participate in Medicare, Medicaid or managed care program needed by the patient, have an available bed and are willing to accept the patient.  Yes   Patient/family informed of [facility]'s ownership interest in [facility] and [facility], as well as of the fact that they are under no obligation to receive care at these facilities.  PASRR submitted to EDS on 02/06/18     PASRR number received on 02/01/18     Existing PASRR number confirmed on       FL2 transmitted to all facilities in geographic area requested by pt/family on 02/02/18     FL2 transmitted to all facilities within larger geographic area on       Patient informed that his/her managed care company has contracts with or will negotiate with certain facilities, including the following:        Yes   Patient/family informed of bed offers received.  Patient chooses bed at [facility]     Physician recommends and patient chooses bed at      Patient to be transferred to [facility] on 02/06/18.  Patient to be transferred to facility by [facility]     Patient family notified on 02/06/18 of transfer.  Name of family member notified:  Patient's husband Park Breed is at bedside and aware of discharge.      PHYSICIAN Please sign FL2     Additional Comment:    _______________________________________________ Darleene Cleaver, LCSWA 02/06/2018, 12:25 PM

## 2018-02-06 NOTE — Care Management Important Message (Signed)
Copy of signed IM left with patient in room.  

## 2018-02-06 NOTE — Progress Notes (Signed)
Discharge report called to Sabrina at [facility]/ verbalized an understanding/ pt to be discharged with foley and PICC line/ EMS called to transport.

## 2018-02-06 NOTE — Progress Notes (Signed)
[facility] [place], [place] 02/06/18  Subjective:  Patient seen at bedside. Creatinine down to 1.2. Improved appetite today.  Objective:  Vital signs in last 24 hours:  Temp:  [98 F (36.7 C)-98.2 F (36.8 C)] 98.2 F (36.8 C) (10/02 0759) Pulse Rate:  [74-93] 74 (10/02 0759) Resp:  [16-18] 18 (10/02 0759) BP: (129-145)/(58-79) 145/79 (10/02 0759) SpO2:  [95 %-100 %] 100 % (10/02 0759)  Weight change:  Filed Weights   01/27/18 0840 01/27/18 1122 02/01/18 0320  Weight: 54.4 kg 57.4 kg 62.1 kg    Intake/Output:    Intake/Output Summary (Last 24 hours) at 02/06/2018 1103 Last data filed at 02/06/2018 0032 Gross per 24 hour  Intake -  Output 400 ml  Net -400 ml     Physical Exam: General: Ill appearing  HEENT moist oral mucus membranes  Neck supple  Pulm/lungs Decreased breath sounds at bases, normal effort  CVS/Heart Irregular  Abdomen:  Soft, NTND, BS present  Extremities: trace edema  Neurologic: Awake, alert, follows commands  Skin: Warm, dry  GU: Foley in place, hematuria has cleared       Basic Metabolic Panel:  Recent Labs  Lab 02/01/18 0447 02/02/18 0549 02/03/18 0956 02/05/18 0632 02/06/18 0500  NA 140 139 136 137 136  K 2.9* 4.0 3.8 3.4* 4.1  CL 112* 112* 108 109 105  CO2 20* 20* 20* 20* 22  GLUCOSE 88 98 185* 74 197*  BUN 30* 23 19 12 12   CREATININE 1.37* 1.50* 1.31* 1.29* 1.22*  CALCIUM 7.2* 7.2* 6.9* 6.7* 7.3*  MG  --   --   --  1.3*  --   PHOS  --   --   --  3.0  --      CBC: Recent Labs  Lab 02/01/18 0447 02/02/18 0549 02/04/18 0614 02/05/18 0632 02/06/18 0500  WBC 8.6 9.6 7.0 8.4 5.2  HGB 8.1* 8.1* 7.4* 7.6* 8.4*  HCT 24.4* 24.4* 22.3* 22.6* 24.9*  MCV 95.0 95.8 96.8 97.0 96.2  PLT 196 196 163 185 196     No results found for: HEPBSAG, HEPBSAB, HEPBIGM    Microbiology:  Recent Results (from the past 240 hour(s))  MRSA PCR Screening     Status: None   Collection Time: 01/27/18 11:25 AM   Result Value Ref Range Status   MRSA by PCR NEGATIVE NEGATIVE Final    Comment:        The GeneXpert MRSA Assay (FDA approved for NASAL specimens only), is one component of a comprehensive MRSA colonization surveillance program. It is not intended to diagnose MRSA infection nor to guide or monitor treatment for MRSA infections. Performed at [facility], [address] [place], [place] [address]   C difficile quick scan w PCR reflex     Status: None   Collection Time: 01/28/18  1:14 PM  Result Value Ref Range Status   C Diff antigen NEGATIVE NEGATIVE Final   C Diff toxin NEGATIVE NEGATIVE Final   C Diff interpretation No C. difficile detected.  Final    Comment: Performed at [facility], [address] [place], [place] [address]  Urine Culture     Status: None   Collection Time: 01/28/18  6:31 PM  Result Value Ref Range Status   Specimen Description   Final    URINE, RANDOM Performed at [facility], [address] [place], [place] [address]    Special Requests RIGHT RENAL PELVIS  Final  Culture   Final    NO GROWTH Performed at [facility], [address] [place], [place] [address]    Report Status 01/30/2018 FINAL  Final  Urine Culture     Status: Abnormal   Collection Time: 01/28/18  6:33 PM  Result Value Ref Range Status   Specimen Description   Final    URINE, RANDOM Performed at [facility], [address], [place], [place] [address]    Special Requests   Final    LEFT KIDNEY Performed at [facility], [address] [place], [place] [address]    Culture 10,000 COLONIES/mL ESCHERICHIA COLI (A)  Final   Report Status 01/31/2018 FINAL  Final   Organism ID, Bacteria ESCHERICHIA COLI (A)  Final      Susceptibility   Escherichia coli - MIC*    AMPICILLIN <=2 SENSITIVE Sensitive     CEFAZOLIN <=4 SENSITIVE Sensitive     CEFTRIAXONE <=1 SENSITIVE Sensitive     CIPROFLOXACIN <=0.25 SENSITIVE Sensitive      GENTAMICIN 2 SENSITIVE Sensitive     IMIPENEM <=0.25 SENSITIVE Sensitive     NITROFURANTOIN 64 INTERMEDIATE Intermediate     TRIMETH/SULFA <=20 SENSITIVE Sensitive     AMPICILLIN/SULBACTAM <=2 SENSITIVE Sensitive     PIP/TAZO <=4 SENSITIVE Sensitive     Extended ESBL NEGATIVE Sensitive     * 10,000 COLONIES/mL ESCHERICHIA COLI  CULTURE, BLOOD (ROUTINE X 2) w Reflex to ID Panel     Status: None   Collection Time: 01/29/18  9:32 AM  Result Value Ref Range Status   Specimen Description BLOOD RIGH HAND  Final   Special Requests   Final    BOTTLES DRAWN AEROBIC AND ANAEROBIC Blood Culture adequate volume   Culture   Final    NO GROWTH 5 DAYS Performed at [facility], [address] [place], [place] [address]    Report Status 02/03/2018 FINAL  Final  CULTURE, BLOOD (ROUTINE X 2) w Reflex to ID Panel     Status: None   Collection Time: 01/29/18  9:43 AM  Result Value Ref Range Status   Specimen Description BLOOD LEFT HAND  Final   Special Requests   Final    BOTTLES DRAWN AEROBIC AND ANAEROBIC Blood Culture adequate volume   Culture   Final    NO GROWTH 5 DAYS Performed at [facility], [address] [place], [place] [address]    Report Status 02/03/2018 FINAL  Final    Coagulation Studies: No results for input(s): LABPROT, INR in the last 72 hours.  Urinalysis: No results for input(s): COLORURINE, LABSPEC, PHURINE, GLUCOSEU, HGBUR, BILIRUBINUR, KETONESUR, PROTEINUR, UROBILINOGEN, NITRITE, LEUKOCYTESUR in the last 72 hours.  Invalid input(s): APPERANCEUR    Imaging: Dg Chest Port 1 View  Result Date: 02/05/2018 CLINICAL DATA:  Cough EXAM: PORTABLE CHEST 1 VIEW COMPARISON:  02/02/2018 FINDINGS: Right PICC line tip at the cavoatrial junction. Cardiomegaly with vascular congestion. Increasing left lower lobe atelectasis or infiltrate and small left effusion. IMPRESSION: Increasing left basilar atelectasis or consolidation/pneumonia. Small left effusion.  Cardiomegaly, vascular congestion. Electronically Signed   By: Charlett Nose M.D.   On: 02/05/2018 10:16     Medications:   . cefTRIAXone (ROCEPHIN)  IV 2 g (02/05/18 1109)   . atorvastatin  20 mg Oral q1800  . budesonide (PULMICORT) nebulizer solution  0.5 mg Nebulization BID  . carbamide peroxide  5 drop Both EARS BID  . chlorhexidine  15 mL Mouth  Rinse BID  . diltiazem  60 mg Oral Q12H  . famotidine  20 mg Oral Daily  . furosemide  40 mg Intravenous Once  . insulin aspart  0-5 Units Subcutaneous QHS  . insulin aspart  0-9 Units Subcutaneous TID WC  . ipratropium-albuterol  3 mL Nebulization Q6H  . mouth rinse  15 mL Mouth Rinse q12n4p  . metoprolol tartrate  75 mg Oral BID  . multivitamin with minerals  1 tablet Oral Daily  . multivitamin-lutein  1 capsule Oral Daily  . protein supplement shake  11 oz Oral BID BM  . sodium chloride flush  10 mL Intracatheter Q12H  . sodium chloride flush  10 mL Intravenous Q12H  . sodium chloride flush  10-40 mL Intracatheter Q12H  . vitamin C  250 mg Oral BID   acetaminophen **OR** acetaminophen, albuterol, ondansetron **OR** ondansetron (ZOFRAN) IV, promethazine, sodium chloride flush, traMADol  Assessment/ Plan:  77 y.o. caucasian female with medical problems of Diabetes, HTN, HLD, salivary gland stones who was admitted to [facility] on 01/27/2018 for evaluation of generalized fatigue and is Dx with severe sepsis, UTI and ARF.  1. ARF, multifactorial 2. B/l hydronephrosis- b/l ureteral stent placed 9/23 by Dr Annabell Howells 3. Sepsis, Gram positive bacteremia  9/22, negative cultures 9/24 4. Acidosis,  Hypernatremia, Hypokalemia 5. UTI- E Coli 6. CKD st 3, Baseline Cr 1.38/ GFR 36 from Jan 2019 7. DM-2 with CKD 8. New onset A Fib 9. Enlarging LUL nodule 10. 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 Patient is s/p b/l ureteral stent  placement.  Foley kept in place to avoid hydronephrosis as well.   Plan:  Creatinine down to 1.22.  This is a good sign.  Hematuria has also cleared at the moment.  Recommend continued periodic monitoring of renal function as an outpatient.  In addition she will need close urologic follow-up given her underlying bladder lesions.  Disposition as per hospitalist.    LOS: 10 Eupha Lobb 10/2/201911:03 AM  [facility] [place], [place] [phone]  Note: This note was prepared with Dragon dictation. Any transcription errors are unintentional

## 2018-02-06 NOTE — Progress Notes (Signed)
Pt refused 0200 hr tx 

## 2018-02-06 NOTE — Clinical Social Work Note (Signed)
Patient to be d/c'ed today to [facility] room 505.  Patient and family agreeable to plans will transport via ems RN to call report [phone].  Patient's husband is at bedside and is aware of patient discharging today.  Windell Moulding, MSW, Theresia Majors [phone]

## 2018-02-06 NOTE — Plan of Care (Signed)
  Problem: Clinical Measurements: Goal: Will remain free from infection Outcome: Progressing   Problem: Elimination: Goal: Will not experience complications related to urinary retention Outcome: Progressing  Foley in place Problem: Skin Integrity: Goal: Risk for impaired skin integrity will decrease Outcome: Progressing  Prophylactic foam to coccyx

## 2018-02-06 NOTE — Discharge Summary (Signed)
[facility] [place] at [facility]   PATIENT NAME: Tiffany Manning    MR#:  [id number]  DATE OF BIRTH:  04/15/1941  DATE OF ADMISSION:  01/27/2018 ADMITTING PHYSICIAN: Houston Siren, MD  DATE OF DISCHARGE: 02/06/2018  PRIMARY CARE PHYSICIAN: Jaclyn Shaggy, MD    ADMISSION DIAGNOSIS:  Atrial fibrillation with rapid ventricular response (HCC) [I48.91] AKI (acute kidney injury) (HCC) [N17.9] Diabetic ketoacidosis without coma associated with other specified diabetes mellitus (HCC) [E13.10] Severe sepsis with septic shock (CODE) (HCC) [R65.21]  DISCHARGE DIAGNOSIS:  Active Problems:   Severe sepsis (HCC)   Diabetic acidosis without coma (HCC)   Pressure injury of skin   Atrial fibrillation with rapid ventricular response (HCC)   SECONDARY DIAGNOSIS:   Past Medical History:  Diagnosis Date  . Diabetes mellitus without complication (HCC)   . Hyperlipidemia   . Hypertension     HOSPITAL COURSE:   1.  Severe sepsis secondary to UTI with E. coli and blood cultures with staph hominis.  Infectious disease recommended course of treatment.  Rocephin will be continued through 02/11/2018 dosing.  PICC line can be removed after that dose.  Neurology did see the patient for Foley catheter placement cystoscopy and stent insertion for bilateral hydronephrosis.  Urine was dark for the last few days secondary to blood.  Eliquis was stopped.  Follow-up with urology already scheduled.  PICC line care as per protocol. 2.  Nausea vomiting and dry heaving.  This has resolved.  PRN nausea medication. 3.  Cough and shortness of breath.  This was likely fluid overload.  I gave a dose of Lasix yesterday and today.  Can give Lasix as needed if still having issues with shortness of breath. 4.  Anemia.  Hemoglobin up to 8.4.  After giving Lasix dosing.  Eliquis held.  Recommend checking weekly CBC for now. 5.  Patient had diabetic ketoacidosis on admission was placed on insulin drip.   Hypoglycemia with diabetes and the patient's nausea vomiting.  I discontinued Lantus.  Since I ended up giving Solu-Medrol the sugars are actually high.  We will get rid of the Solu-Medrol since the chest x-ray showed fluid overload.  Can go back on glipizide 2.5 mg daily as outpatient.  Check fingersticks q. before meals and nightly.  Sliding scale insulin can be used.    For sugars q. before meals, can give NovoLog 1 unit for sugars 1 21-1 50, 2 units for sugars 1 51-200, 3 units for sugars 201 to 250, 5 units for sugars 2 51-300, 7 units for sugars 301 to 350, greater than 351 can give 9 units.  For nighttime sugars can give NovoLog 2 units for sugars 201 to 250, 3 units for sugars 2 51-300, 4 units for sugars 301 to 350, 5 units for sugars greater than 351  6.  Acute kidney injury secondary to severe sepsis and infection.  Creatinine has improved.  Patient has underlying chronic kidney disease stage III. 7.  Atrial fibrillation with rapid ventricular response.  I stopped Eliquis secondary to hematuria and anemia.  Risk of stroke is higher being off Eliquis.  Urine has cleared up once I stopped the Eliquis.  Patient is on metoprolol and Cardizem for rate control.  Once urology work-up is completed can consider starting Eliquis as outpatient. 8.  Leukocytosis has improved. 9.  Hypertension on metoprolol 10.  Hyperlipidemia unspecified on Lipitor     DISCHARGE CONDITIONS:   Satisfactory  CONSULTS OBTAINED:  Treatment Team:  Mosetta Pigeon, MD Bjorn Pippin, MD Lynn Ito, MD  DRUG ALLERGIES:   Allergies  Allergen Reactions  . Sulfa Antibiotics Nausea And Vomiting    DISCHARGE MEDICATIONS:   Allergies as of 02/06/2018      Reactions   Sulfa Antibiotics Nausea And Vomiting      Medication List    STOP taking these medications   atenolol 50 MG tablet Commonly known as:  TENORMIN   glipiZIDE-metformin 5-500 MG tablet Commonly known as:  METAGLIP   nitrofurantoin  (macrocrystal-monohydrate) 100 MG capsule Commonly known as:  MACROBID   simvastatin 40 MG tablet Commonly known as:  ZOCOR   triamterene-hydrochlorothiazide 37.5-25 MG tablet Commonly known as:  MAXZIDE-25     TAKE these medications   acetaminophen 325 MG tablet Commonly known as:  TYLENOL Take 2 tablets (650 mg total) by mouth every 6 (six) hours as needed for mild pain (or Fever >/= 101).   ascorbic acid 250 MG tablet Commonly known as:  VITAMIN C Take 1 tablet (250 mg total) by mouth 2 (two) times daily.   atorvastatin 20 MG tablet Commonly known as:  LIPITOR Take 1 tablet (20 mg total) by mouth daily at 6 PM.   carbamide peroxide 6.5 % OTIC solution Commonly known as:  DEBROX Place 5 drops into both ears 2 (two) times daily for 5 days.   cefTRIAXone  IVPB Commonly known as:  ROCEPHIN Inject 2 g into the vein daily for 6 days. Indication: complicated pyelonephritis Last Day of Therapy:  02/11/2018 Labs weekly while on IV antibiotics: CBC with differential CMP   cefTRIAXone 2 g in sodium chloride 0.9 % 100 mL Inject 2 g into the vein daily. Start taking on:  02/07/2018   diltiazem 60 MG 12 hr capsule Commonly known as:  CARDIZEM SR Take 1 capsule (60 mg total) by mouth every 12 (twelve) hours.   famotidine 20 MG tablet Commonly known as:  PEPCID Take 1 tablet (20 mg total) by mouth daily.   glipiZIDE 5 MG tablet Commonly known as:  GLUCOTROL Take 0.5 tablets (2.5 mg total) by mouth daily before breakfast.   insulin aspart 100 UNIT/ML injection Commonly known as:  novoLOG Inject 0-5 Units into the skin at bedtime.   insulin aspart 100 UNIT/ML injection Commonly known as:  novoLOG Inject 0-9 Units into the skin 3 (three) times daily with meals.   ipratropium-albuterol 0.5-2.5 (3) MG/3ML Soln Commonly known as:  DUONEB Take 3 mLs by nebulization every 6 (six) hours.   Metoprolol Tartrate 75 MG Tabs Take 75 mg by mouth 2 (two) times daily.   multivitamin  with minerals Tabs tablet Take 1 tablet by mouth daily.   ondansetron 4 MG tablet Commonly known as:  ZOFRAN Take 1 tablet (4 mg total) by mouth every 8 (eight) hours as needed for nausea or vomiting.   protein supplement shake Liqd Commonly known as:  PREMIER PROTEIN Take 325 mLs (11 oz total) by mouth 2 (two) times daily between meals.   sodium chloride flush 0.9 % Soln Commonly known as:  NS 10 mLs by Intracatheter route every 12 (twelve) hours.            Home Infusion Instuctions  (From admission, onward)         Start     Ordered   02/06/18 0000  Home infusion instructions [facility] May follow [facility] Dosing Protocol; May administer Cathflo as needed to maintain patency of vascular access device.; Flushing of vascular  access device: per [facility] Protocol: 0.9% NaCl pre/post medica...    Question Answer Comment  Instructions May follow [facility] Dosing Protocol   Instructions May administer Cathflo as needed to maintain patency of vascular access device.   Instructions Flushing of vascular access device: per [facility] Protocol: 0.9% NaCl pre/post medication administration and prn patency; Heparin 100 u/ml, 5ml for implanted ports and Heparin 10u/ml, 5ml for all other central venous catheters.   Instructions May follow [facility] Anaphylaxis Protocol for First Dose Administration in the home: 0.9% NaCl at 25-50 ml/hr to maintain IV access for protocol meds. Epinephrine 0.3 ml IV/IM PRN and Benadryl 25-50 IV/IM PRN s/s of anaphylaxis.   Instructions [facility] Infusion Coordinator (RN) to assist per patient IV care needs in the home PRN.      02/06/18 1000           DISCHARGE INSTRUCTIONS:   Follow-up with Dr. at rehab 1 day Follow-up with urology as scheduled Follow-up cardiology 2 weeks  If you experience worsening of your admission symptoms, develop shortness of breath, life threatening emergency, suicidal or homicidal thoughts you must seek medical  attention immediately by calling 911 or calling your MD immediately  if symptoms less severe.  You Must read complete instructions/literature along with all the possible adverse reactions/side effects for all the Medicines you take and that have been prescribed to you. Take any new Medicines after you have completely understood and accept all the possible adverse reactions/side effects.   Please note  You were cared for by a hospitalist during your hospital stay. If you have any questions about your discharge medications or the care you received while you were in the hospital after you are discharged, you can call the unit and asked to speak with the hospitalist on call if the hospitalist that took care of you is not available. Once you are discharged, your primary care physician will handle any further medical issues. Please note that NO REFILLS for any discharge medications will be authorized once you are discharged, as it is imperative that you return to your primary care physician (or establish a relationship with a primary care physician if you do not have one) for your aftercare needs so that they can reassess your need for medications and monitor your lab values.    Today   CHIEF COMPLAINT:   Chief Complaint  Patient presents with  . Weakness  . GI Bleeding  . Fall    HISTORY OF PRESENT ILLNESS:  Shanigua Gibb  is a 78 y.o. female brought in with weakness   VITAL SIGNS:  Blood pressure (!) 145/79, pulse 74, temperature 98.2 F (36.8 C), temperature source Oral, resp. rate 18, height 5' (1.524 m), weight 62.1 kg, SpO2 100 %.   PHYSICAL EXAMINATION:  GENERAL:  77 y.o.-year-old patient lying in the bed with no acute distress.  EYES: Pupils equal, round, reactive to light and accommodation. No scleral icterus. Extraocular muscles intact.  HEENT: Head atraumatic, normocephalic. Oropharynx and nasopharynx clear.  NECK:  Supple, no jugular venous distention. No thyroid enlargement,  no tenderness.  LUNGS: Decreased breath sounds bilateral bases, no wheezing, rales,rhonchi or crepitation. No use of accessory muscles of respiration.  CARDIOVASCULAR: S1, S2 regular regular. No murmurs, rubs, or gallops.  ABDOMEN: Soft, non-tender, non-distended. Bowel sounds present. No organomegaly or mass.  EXTREMITIES: Trace pedal edema, no cyanosis, or clubbing.  NEUROLOGIC: Cranial nerves II through XII are intact. Muscle strength 5/5 in all extremities. Sensation intact. Gait not  checked.  PSYCHIATRIC: The patient is alert and oriented x 3.  SKIN: No obvious rash, lesion, or ulcer.   DATA REVIEW:   CBC Recent Labs  Lab 02/06/18 0500  WBC 5.2  HGB 8.4*  HCT 24.9*  PLT 196    Chemistries  Recent Labs  Lab 02/05/18 0632 02/06/18 0500  NA 137 136  K 3.4* 4.1  CL 109 105  CO2 20* 22  GLUCOSE 74 197*  BUN 12 12  CREATININE 1.29* 1.22*  CALCIUM 6.7* 7.3*  MG 1.3*  --      Microbiology Results  Results for orders placed or performed during the hospital encounter of 01/27/18  Blood Culture (routine x 2)     Status: Abnormal   Collection Time: 01/27/18  8:40 AM  Result Value Ref Range Status   Specimen Description   Final    BLOOD RIGHT ANTECUBITAL Performed at [facility], [address] [place], [place] [address]    Special Requests   Final    BOTTLES DRAWN AEROBIC AND ANAEROBIC Blood Culture adequate volume Performed at [facility], [address], [place], [place] [address]    Culture  Setup Time   Final    AEROBIC BOTTLE ONLY GRAM POSITIVE COCCI CRITICAL RESULT CALLED TO, READ BACK BY AND VERIFIED WITH: MATT [facility] 01/28/18 0618 REC Performed at [facility], [address] [place], [place] [address]    Culture STAPHYLOCOCCUS HOMINIS (A)  Final   Report Status 01/30/2018 FINAL  Final   Organism ID, Bacteria STAPHYLOCOCCUS HOMINIS  Final      Susceptibility   Staphylococcus hominis - MIC*    CIPROFLOXACIN >=8 RESISTANT  Resistant     ERYTHROMYCIN <=0.25 SENSITIVE Sensitive     GENTAMICIN <=0.5 SENSITIVE Sensitive     OXACILLIN >=4 RESISTANT Resistant     TETRACYCLINE >=16 RESISTANT Resistant     VANCOMYCIN <=0.5 SENSITIVE Sensitive     TRIMETH/SULFA >=320 RESISTANT Resistant     CLINDAMYCIN <=0.25 SENSITIVE Sensitive     RIFAMPIN <=0.5 SENSITIVE Sensitive     Inducible Clindamycin NEGATIVE Sensitive     * STAPHYLOCOCCUS HOMINIS  Blood Culture ID Panel (Reflexed)     Status: Abnormal   Collection Time: 01/27/18  8:40 AM  Result Value Ref Range Status   Enterococcus species NOT DETECTED NOT DETECTED Final   Listeria monocytogenes NOT DETECTED NOT DETECTED Final   Staphylococcus species DETECTED (A) NOT DETECTED Final    Comment: Methicillin (oxacillin) resistant coagulase negative staphylococcus. Possible blood culture contaminant (unless isolated from more than one blood culture draw or clinical case suggests pathogenicity). No antibiotic treatment is indicated for blood  culture contaminants. CRITICAL RESULT CALLED TO, READ BACK BY AND VERIFIED WITH: MATT [facility] 01/28/18 0618 REC    Staphylococcus aureus NOT DETECTED NOT DETECTED Final   Methicillin resistance DETECTED (A) NOT DETECTED Final    Comment: CRITICAL RESULT CALLED TO, READ BACK BY AND VERIFIED WITH: MATT [facility] 01/28/18 0618 REC    Streptococcus species NOT DETECTED NOT DETECTED Final   Streptococcus agalactiae NOT DETECTED NOT DETECTED Final   Streptococcus pneumoniae NOT DETECTED NOT DETECTED Final   Streptococcus pyogenes NOT DETECTED NOT DETECTED Final   Acinetobacter baumannii NOT DETECTED NOT DETECTED Final   Enterobacteriaceae species NOT DETECTED NOT DETECTED Final   Enterobacter cloacae complex NOT DETECTED NOT DETECTED Final   Escherichia coli NOT DETECTED NOT DETECTED Final   Klebsiella oxytoca NOT DETECTED NOT DETECTED Final   Klebsiella pneumoniae  NOT DETECTED NOT DETECTED Final   Proteus species NOT DETECTED NOT DETECTED  Final   Serratia marcescens NOT DETECTED NOT DETECTED Final   Haemophilus influenzae NOT DETECTED NOT DETECTED Final   Neisseria meningitidis NOT DETECTED NOT DETECTED Final   Pseudomonas aeruginosa NOT DETECTED NOT DETECTED Final   Candida albicans NOT DETECTED NOT DETECTED Final   Candida glabrata NOT DETECTED NOT DETECTED Final   Candida krusei NOT DETECTED NOT DETECTED Final   Candida parapsilosis NOT DETECTED NOT DETECTED Final   Candida tropicalis NOT DETECTED NOT DETECTED Final    Comment: Performed at [facility], [address] [place], [place] [address]  Blood Culture (routine x 2)     Status: Abnormal   Collection Time: 01/27/18  8:46 AM  Result Value Ref Range Status   Specimen Description   Final    BLOOD LEFT ANTECUBITAL Performed at [facility], [address], [place], [place] [address]    Special Requests   Final    BOTTLES DRAWN AEROBIC AND ANAEROBIC Blood Culture adequate volume Performed at [facility], [address] [place], [place] [address]    Culture  Setup Time   Final    GRAM POSITIVE COCCI AEROBIC BOTTLE ONLY CRITICAL VALUE NOTED.  VALUE IS CONSISTENT WITH PREVIOUSLY REPORTED AND CALLED VALUE. Performed at [facility], [address] [place], [place] [address]    Culture (A)  Final    STAPHYLOCOCCUS CAPITIS THE SIGNIFICANCE OF ISOLATING THIS ORGANISM FROM A SINGLE SET OF BLOOD CULTURES WHEN MULTIPLE SETS ARE DRAWN IS UNCERTAIN. PLEASE NOTIFY THE MICROBIOLOGY DEPARTMENT WITHIN ONE WEEK IF SPECIATION AND SENSITIVITIES ARE REQUIRED. Performed at [facility], [address], [place], [place] [address]    Report Status 01/30/2018 FINAL  Final  Urine Culture     Status: Abnormal   Collection Time: 01/27/18  9:45 AM  Result Value Ref Range Status   Specimen Description   Final    URINE, CATHETERIZED Performed at [facility], [address] [place], [place] [address]    Special Requests    Final    NONE Performed at [facility], [address] [place], [place] [address]    Culture >=100,000 COLONIES/mL ESCHERICHIA COLI (A)  Final   Report Status 01/30/2018 FINAL  Final   Organism ID, Bacteria ESCHERICHIA COLI (A)  Final      Susceptibility   Escherichia coli - MIC*    AMPICILLIN 16 INTERMEDIATE Intermediate     CEFAZOLIN <=4 SENSITIVE Sensitive     CEFTRIAXONE <=1 SENSITIVE Sensitive     CIPROFLOXACIN <=0.25 SENSITIVE Sensitive     GENTAMICIN <=1 SENSITIVE Sensitive     IMIPENEM <=0.25 SENSITIVE Sensitive     NITROFURANTOIN 32 SENSITIVE Sensitive     TRIMETH/SULFA <=20 SENSITIVE Sensitive     AMPICILLIN/SULBACTAM 8 SENSITIVE Sensitive     PIP/TAZO <=4 SENSITIVE Sensitive     Extended ESBL NEGATIVE Sensitive     * >=100,000 COLONIES/mL ESCHERICHIA COLI  MRSA PCR Screening     Status: None   Collection Time: 01/27/18 11:25 AM  Result Value Ref Range Status   MRSA by PCR NEGATIVE NEGATIVE Final    Comment:        The GeneXpert MRSA Assay (FDA approved for NASAL specimens only), is one component of a comprehensive MRSA colonization surveillance program. It is not intended to diagnose MRSA infection nor to guide or monitor treatment for MRSA infections. Performed at [facility],  [address], [place], [place] [address]   C difficile quick scan w PCR reflex     Status: None   Collection Time: 01/28/18  1:14 PM  Result Value Ref Range Status   C Diff antigen NEGATIVE NEGATIVE Final   C Diff toxin NEGATIVE NEGATIVE Final   C Diff interpretation No C. difficile detected.  Final    Comment: Performed at [facility], [address] [place], [place] [address]  Urine Culture     Status: None   Collection Time: 01/28/18  6:31 PM  Result Value Ref Range Status   Specimen Description   Final    URINE, RANDOM Performed at [facility], [address] [place], [place] [address]    Special Requests RIGHT RENAL PELVIS   Final   Culture   Final    NO GROWTH Performed at [facility], [address], [place], [place] [address]    Report Status 01/30/2018 FINAL  Final  Urine Culture     Status: Abnormal   Collection Time: 01/28/18  6:33 PM  Result Value Ref Range Status   Specimen Description   Final    URINE, RANDOM Performed at [facility], [address] [place], [place] [address]    Special Requests   Final    LEFT KIDNEY Performed at [facility], [address] [place], [place] [address]    Culture 10,000 COLONIES/mL ESCHERICHIA COLI (A)  Final   Report Status 01/31/2018 FINAL  Final   Organism ID, Bacteria ESCHERICHIA COLI (A)  Final      Susceptibility   Escherichia coli - MIC*    AMPICILLIN <=2 SENSITIVE Sensitive     CEFAZOLIN <=4 SENSITIVE Sensitive     CEFTRIAXONE <=1 SENSITIVE Sensitive     CIPROFLOXACIN <=0.25 SENSITIVE Sensitive     GENTAMICIN 2 SENSITIVE Sensitive     IMIPENEM <=0.25 SENSITIVE Sensitive     NITROFURANTOIN 64 INTERMEDIATE Intermediate     TRIMETH/SULFA <=20 SENSITIVE Sensitive     AMPICILLIN/SULBACTAM <=2 SENSITIVE Sensitive     PIP/TAZO <=4 SENSITIVE Sensitive     Extended ESBL NEGATIVE Sensitive     * 10,000 COLONIES/mL ESCHERICHIA COLI  CULTURE, BLOOD (ROUTINE X 2) w Reflex to ID Panel     Status: None   Collection Time: 01/29/18  9:32 AM  Result Value Ref Range Status   Specimen Description BLOOD RIGH HAND  Final   Special Requests   Final    BOTTLES DRAWN AEROBIC AND ANAEROBIC Blood Culture adequate volume   Culture   Final    NO GROWTH 5 DAYS Performed at [facility], [address] [place], [place] [address]    Report Status 02/03/2018 FINAL  Final  CULTURE, BLOOD (ROUTINE X 2) w Reflex to ID Panel     Status: None   Collection Time: 01/29/18  9:43 AM  Result Value Ref Range Status   Specimen Description BLOOD LEFT HAND  Final   Special Requests   Final    BOTTLES DRAWN AEROBIC AND ANAEROBIC Blood Culture  adequate volume   Culture   Final    NO GROWTH 5 DAYS Performed at [facility], [address], [place], [place] [address]    Report Status 02/03/2018 FINAL  Final    RADIOLOGY:  Dg Chest Port 1 View  Result Date: 02/05/2018 CLINICAL DATA:  Cough EXAM: PORTABLE CHEST 1 VIEW COMPARISON:  02/02/2018 FINDINGS: Right PICC line tip at the  cavoatrial junction. Cardiomegaly with vascular congestion. Increasing left lower lobe atelectasis or infiltrate and small left effusion. IMPRESSION: Increasing left basilar atelectasis or consolidation/pneumonia. Small left effusion. Cardiomegaly, vascular congestion. Electronically Signed   By: Charlett Nose M.D.   On: 02/05/2018 10:16      Management plans discussed with the patient, family and they are in agreement.  CODE STATUS:     Code Status Orders  (From admission, onward)         Start     Ordered   01/27/18 1123  Full code  Continuous     01/27/18 1122        Code Status History    Date Active Date Inactive Code Status Order ID Comments User Context   05/28/2017 1637 06/06/2017 2002 Full Code [id number]  Altamese Dilling, MD ED      TOTAL TIME TAKING CARE OF THIS PATIENT: 35 minutes.    Alford Highland M.D on 02/06/2018 at 10:08 AM  Between 7am to 6pm - Pager - [phone]  After 6pm go to [email or web address] - password [email or web address]  [facility] Office  [phone]  CC: Primary care physician; Jaclyn Shaggy, MD

## 2018-02-20 ENCOUNTER — Ambulatory Visit: Payer: Medicare HMO | Admitting: Urology

## 2018-02-20 ENCOUNTER — Encounter: Payer: Self-pay | Admitting: Urology

## 2018-02-20 ENCOUNTER — Other Ambulatory Visit: Payer: Self-pay

## 2018-02-20 ENCOUNTER — Encounter: Payer: Self-pay | Admitting: Radiology

## 2018-02-20 VITALS — BP 142/78 | HR 82 | Ht 60.0 in | Wt 120.0 lb

## 2018-02-20 DIAGNOSIS — N1339 Other hydronephrosis: Secondary | ICD-10-CM | POA: Diagnosis not present

## 2018-02-20 NOTE — Progress Notes (Signed)
02/20/2018 3:58 PM   Charlann Lange [birth year] [id number]  Referring provider: Jaclyn Shaggy, MD [address]   [place], [place] [address]  CC: Hospital follow-up bilateral hydronephrosis  HPI: I saw Ms. Jetter in urology clinic today for discussion of post hospital follow-up for bilateral hydronephrosis.  She is a 77 year old female with diabetes that was admitted to the hospital 01/27/2018 with general malaise and fatigue and ultimately diagnosed with sepsis from a urinary source.  She was hypotensive and tachycardic with significant leukocytosis, and acute kidney injury with creatinine of 2.6.  CT scan without contrast demonstrated bilateral moderate hydroureteronephrosis, and bladder was decompressed with a Foley catheter.  On review of old imaging, in 2013 she had a CT scan without contrast that did show mild left hydronephrosis and hydroureter without any obvious obstructing stones.  On 01/28/2018 she underwent urgent cystoscopy and bilateral ureteral stent placement with Dr. Annabell Howells here at [facility].  Intra-Op, he noted a very small volume bladder with keratinizing changes worrisome for possible malignancy.  She improved with ureteral stents and Foley decompression, and was discharged on antibiotics.  She is currently at rehab and doing well and is completed her course of antibiotics for E. coli UTI.  Prior to this hospitalization, she does report 2-3 UTIs per year which usually are fully with left-sided flank pain.  These do improved typically with antibiotics.  She denies any hematuria.  She does have urgency and frequency at baseline with voiding every 30 minutes to an hour during the day.  She does feel like she voids with a strong stream.  She denies any smoking history.  Her Foley catheter from previous hospitalization remains in place.   PMH: Past Medical History:  Diagnosis Date  . Diabetes mellitus without complication (HCC)   . Hyperlipidemia   . Hypertension     Surgical  History: Past Surgical History:  Procedure Laterality Date  . ABDOMINAL HYSTERECTOMY    . CYSTOSCOPY WITH STENT PLACEMENT Bilateral 01/28/2018   Procedure: CYSTOSCOPY WITH STENT PLACEMENT;  Surgeon: Bjorn Pippin, MD;  Location: [name] ORS;  Service: Urology;  Laterality: Bilateral;    Allergies:  Allergies  Allergen Reactions  . Sulfa Antibiotics Nausea And Vomiting    Family History: Family History  Problem Relation Age of Onset  . Diabetes Sister     Social History:  reports that she has never smoked. She has never used smokeless tobacco. She reports that she does not drink alcohol or use drugs.  ROS: Please see flowsheet from today's date for complete review of systems.  Physical Exam: BP (!) 142/78   Pulse 82   Ht 5' (1.524 m)   Wt 120 lb (54.4 kg)   BMI 23.44 kg/m    Constitutional:  Alert and oriented, No acute distress. Cardiovascular: No clubbing, cyanosis, or edema. Respiratory: Normal respiratory effort, no increased work of breathing. GI: Abdomen is soft, nontender, nondistended, no abdominal masses GU: No CVA tenderness, Foley with yellow urine Lymph: No cervical or inguinal lymphadenopathy. Skin: No rashes, bruises or suspicious lesions. Neurologic: Grossly intact, no focal deficits, moving all 4 extremities. Psychiatric: Normal mood and affect.  Pertinent Imaging: I have personally reviewed the CT abdomen pelvis without contrast.  Decompressed bladder with bilateral hydroureteronephrosis.  Assessment & Plan:   In summary, Ms. 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  She was recently admitted 9/22 through 10/2 with sepsis  from urinary source and bilateral hydronephrosis with a decompressed bladder on CT scan.  She underwent bilateral ureteral stent placement with Dr. Annabell Howells, and cystoscopy showed keratinizing changes in the bladder worrisome for possible  malignancy.  At baseline she does have severe urinary urgency and frequency, but no hematuria.  -Foley removal and voiding trial next week in clinic -Etiology of her upper tract obstruction is unclear at this time, will perform retrograde pyelograms and possible ureteroscopy in the operating room for better evaluation.  We will also perform a bladder biopsy to rule out underlying malignancy. -If above negative, she may benefit from urodynamics to evaluate for reflux versus poor compliance/high pressure voiding -Continue to hold anticoagulation until above complete  Sondra Come, MD  [facility] [address], [address] [place], [place] [address] [phone]

## 2018-02-22 ENCOUNTER — Other Ambulatory Visit: Payer: Self-pay | Admitting: Radiology

## 2018-02-22 DIAGNOSIS — N329 Bladder disorder, unspecified: Secondary | ICD-10-CM

## 2018-02-27 ENCOUNTER — Ambulatory Visit (INDEPENDENT_AMBULATORY_CARE_PROVIDER_SITE_OTHER): Payer: Medicare HMO | Admitting: Family Medicine

## 2018-02-27 DIAGNOSIS — N1339 Other hydronephrosis: Secondary | ICD-10-CM

## 2018-02-27 LAB — BLADDER SCAN AMB NON-IMAGING: SCAN RESULT: 76

## 2018-02-27 NOTE — Progress Notes (Signed)
Catheter Removal  Patient is present today for a catheter removal.  10ml of water was drained from the balloon. A 16FR foley cath was removed from the bladder no complications were noted . Patient tolerated well.  Preformed by: Teressa Lower, CMA  Follow up/ Additional notes: Today after 2:00PM for PVR  Bladder Scan Patient can void: 76 ml Performed By: Teressa Lower, CMA

## 2018-02-28 ENCOUNTER — Ambulatory Visit: Payer: Medicare HMO

## 2018-03-05 ENCOUNTER — Encounter
Admission: RE | Admit: 2018-03-05 | Discharge: 2018-03-05 | Disposition: A | Discharge status: Home / Self Care | Payer: Medicare HMO | Source: Ambulatory Visit | Attending: Urology | Admitting: Urology

## 2018-03-05 DIAGNOSIS — Z01812 Encounter for preprocedural laboratory examination: Secondary | ICD-10-CM | POA: Diagnosis present

## 2018-03-05 HISTORY — DX: Unspecified osteoarthritis, unspecified site: M19.90

## 2018-03-05 HISTORY — DX: Personal history of urinary calculi: Z87.442

## 2018-03-05 HISTORY — DX: Unspecified atrial fibrillation: I48.91

## 2018-03-05 HISTORY — DX: Nausea with vomiting, unspecified: R11.2

## 2018-03-05 HISTORY — DX: Sepsis, unspecified organism: A41.9

## 2018-03-05 HISTORY — DX: Other specified postprocedural states: Z98.890

## 2018-03-05 LAB — BASIC METABOLIC PANEL
Anion gap: 12 (ref 5–15)
BUN: 26 mg/dL — ABNORMAL HIGH (ref 8–23)
CALCIUM: 8.7 mg/dL — AB (ref 8.9–10.3)
CO2: 21 mmol/L — ABNORMAL LOW (ref 22–32)
Chloride: 104 mmol/L (ref 98–111)
Creatinine, Ser: 1.32 mg/dL — ABNORMAL HIGH (ref 0.44–1.00)
GFR, EST AFRICAN AMERICAN: 44 mL/min — AB (ref >60)
GFR, EST NON AFRICAN AMERICAN: 38 mL/min — AB (ref >60)
Glucose, Bld: 115 mg/dL — ABNORMAL HIGH (ref 70–99)
Potassium: 4.2 mmol/L (ref 3.5–5.1)
Sodium: 137 mmol/L (ref 135–145)

## 2018-03-05 LAB — CBC
HEMATOCRIT: 32.1 % — AB (ref 36.0–46.0)
Hemoglobin: 9.6 g/dL — ABNORMAL LOW (ref 12.0–15.0)
MCH: 29.1 pg (ref 26.0–34.0)
MCHC: 29.9 g/dL — ABNORMAL LOW (ref 30.0–36.0)
MCV: 97.3 fL (ref 80.0–100.0)
NRBC: 0 % (ref 0.0–0.2)
PLATELETS: 320 10*3/uL (ref 150–400)
RBC: 3.3 MIL/uL — AB (ref 3.87–5.11)
RDW: 13.7 % (ref 11.5–15.5)
WBC: 9 10*3/uL (ref 4.0–10.5)

## 2018-03-05 LAB — URINALYSIS, ROUTINE W REFLEX MICROSCOPIC
BILIRUBIN URINE: NEGATIVE
Glucose, UA: NEGATIVE mg/dL
Ketones, ur: NEGATIVE mg/dL
Nitrite: NEGATIVE
PH: 6 (ref 5.0–8.0)
Protein, ur: 100 mg/dL — AB
RBC / HPF: >50 RBC/hpf — ABNORMAL HIGH (ref 0–5)
SPECIFIC GRAVITY, URINE: 1.016 (ref 1.005–1.030)

## 2018-03-05 NOTE — Patient Instructions (Signed)
Your procedure is scheduled on: March 15, 2018 FRIDAY Report to Day Surgery on the 2nd floor of the [facility]. To find out your arrival time, please call [phone] between 1PM - 3PM on: Thursday March 14, 2018  REMEMBER: Instructions that are not followed completely may result in serious medical risk, up to and including death; or upon the discretion of your surgeon and anesthesiologist your surgery may need to be rescheduled.  Do not eat food after midnight the night before surgery.  No gum chewing, lozengers or hard candies.  You may however, drink CLEAR liquids up to 2 hours before you are scheduled to arrive for your surgery. Do not drink anything within 2 hours of the start of your surgery.  Clear liquids include: - water  Type 1 and Type 2 diabetics should only drink water.    No Smoking including e-cigarettes for 24 hours prior to surgery.  No chewable tobacco products for at least 6 hours prior to surgery.  No nicotine patches on the day of surgery.  On the morning of surgery brush your teeth with toothpaste and water, you may rinse your mouth with mouthwash if you wish. Do not swallow any toothpaste or mouthwash.  Notify your doctor if there is any change in your medical condition (cold, fever, infection).  Do not wear jewelry, make-up, hairpins, clips or nail polish.  Do not wear lotions, powders, or perfumes.   Do not shave 48 hours prior to surgery.   Contacts and dentures may not be worn into surgery.  Do not bring valuables to the hospital, including drivers license, insurance or credit cards.  [facility] is not responsible for any belongings or valuables.   TAKE THESE MEDICATIONS THE MORNING OF SURGERY: DILITIAZEM METOPROLOL  Follow recommendations from Cardiologist, Pulmonologist or PCP regarding stopping Aspirin, Coumadin, Plavix, Eliquis, Pradaxa, or Pletal.  Stop Anti-inflammatories (NSAIDS) such as Advil, Aleve, Ibuprofen, Motrin, Naproxen,  Naprosyn and Aspirin based products such as Excedrin, Goodys Powder, BC Powder. (May take Tylenol or Acetaminophen if needed.)  Stop ANY OVER THE COUNTER supplements until after surgery. (May continue Vitamin D, Vitamin B, and multivitamin.)  Wear comfortable clothing (specific to your surgery type) to the hospital.  Plan for stool softeners for home use.   If you are being discharged the day of surgery, you will not be allowed to drive home. You will need a responsible adult to drive you home and stay with you that night.   If you are taking public transportation, you will need to have a responsible adult with you. Please confirm with your physician that it is acceptable to use public transportation.   Please call [phone] if you have any questions about these instructions.

## 2018-03-06 LAB — URINE CULTURE

## 2018-03-07 ENCOUNTER — Ambulatory Visit (INDEPENDENT_AMBULATORY_CARE_PROVIDER_SITE_OTHER): Payer: Medicare HMO | Admitting: Family Medicine

## 2018-03-07 ENCOUNTER — Telehealth: Payer: Self-pay | Admitting: Family Medicine

## 2018-03-07 DIAGNOSIS — R339 Retention of urine, unspecified: Secondary | ICD-10-CM | POA: Diagnosis not present

## 2018-03-07 LAB — BLADDER SCAN AMB NON-IMAGING

## 2018-03-07 MED ORDER — CIPROFLOXACIN HCL 500 MG PO TABS: ORAL_TABLET | ORAL | 0 refills | Status: DC

## 2018-03-07 NOTE — Progress Notes (Signed)
Cath placement    Patient was cleaned and prepped in a sterile fashion with betadine and 2% lidocaine jelly was instilled into the urethra. A 16 FR foley cath was replaced into the bladder no complications were noted Urine return was noted and urine was yellow in color. The balloon was filled with 10ml of sterile water. A leg bag was attached for drainage.  A night bag was also given to the patient and patient was given instruction on how to change from one bag to another. Patient was given proper instruction on catheter care.    Preformed by: Teressa Lower, CMA  Follow up: as scheduled

## 2018-03-07 NOTE — Telephone Encounter (Signed)
-----   Message from Sondra Come, MD sent at 03/07/2018  7:38 AM EDT ----- Please prescribe her 3 days cipro 500mg  BID, should start abx 3 days prior to surgery (surgery is Nov 8th, so start abx Nov 5th.) Thanks  Legrand Rams, MD 03/07/2018

## 2018-03-07 NOTE — Telephone Encounter (Signed)
Patient notified and voiced understanding.

## 2018-03-12 ENCOUNTER — Ambulatory Visit: Payer: Medicare HMO | Admitting: Physician Assistant

## 2018-03-14 MED ORDER — CIPROFLOXACIN IN D5W 400 MG/200ML IV SOLN
400.0000 mg | INTRAVENOUS | Status: AC
  Administered 2018-03-15: 400 mg via INTRAVENOUS

## 2018-03-15 ENCOUNTER — Other Ambulatory Visit: Payer: Self-pay

## 2018-03-15 ENCOUNTER — Encounter
Admission: RE | Disposition: A | Discharge status: Home / Self Care | Payer: Self-pay | Source: Ambulatory Visit | Attending: Urology

## 2018-03-15 ENCOUNTER — Ambulatory Visit
Admission: RE | Admit: 2018-03-15 | Discharge: 2018-03-15 | Disposition: A | Discharge status: Home / Self Care | Payer: Medicare HMO | Source: Ambulatory Visit | Attending: Urology | Admitting: Urology

## 2018-03-15 ENCOUNTER — Encounter: Payer: Self-pay | Admitting: *Deleted

## 2018-03-15 ENCOUNTER — Ambulatory Visit: Payer: Medicare HMO | Admitting: Anesthesiology

## 2018-03-15 ENCOUNTER — Telehealth: Payer: Self-pay | Admitting: Urology

## 2018-03-15 DIAGNOSIS — N1330 Unspecified hydronephrosis: Secondary | ICD-10-CM

## 2018-03-15 DIAGNOSIS — N3289 Other specified disorders of bladder: Secondary | ICD-10-CM | POA: Diagnosis not present

## 2018-03-15 DIAGNOSIS — K219 Gastro-esophageal reflux disease without esophagitis: Secondary | ICD-10-CM | POA: Insufficient documentation

## 2018-03-15 DIAGNOSIS — R54 Age-related physical debility: Secondary | ICD-10-CM | POA: Diagnosis not present

## 2018-03-15 DIAGNOSIS — M1990 Unspecified osteoarthritis, unspecified site: Secondary | ICD-10-CM | POA: Diagnosis not present

## 2018-03-15 DIAGNOSIS — N111 Chronic obstructive pyelonephritis: Secondary | ICD-10-CM | POA: Insufficient documentation

## 2018-03-15 DIAGNOSIS — R338 Other retention of urine: Secondary | ICD-10-CM

## 2018-03-15 DIAGNOSIS — Z87440 Personal history of urinary (tract) infections: Secondary | ICD-10-CM | POA: Diagnosis not present

## 2018-03-15 DIAGNOSIS — I10 Essential (primary) hypertension: Secondary | ICD-10-CM | POA: Diagnosis not present

## 2018-03-15 DIAGNOSIS — E119 Type 2 diabetes mellitus without complications: Secondary | ICD-10-CM | POA: Insufficient documentation

## 2018-03-15 DIAGNOSIS — N329 Bladder disorder, unspecified: Secondary | ICD-10-CM

## 2018-03-15 DIAGNOSIS — E785 Hyperlipidemia, unspecified: Secondary | ICD-10-CM | POA: Insufficient documentation

## 2018-03-15 HISTORY — PX: CYSTOGRAM: SHX6285

## 2018-03-15 HISTORY — PX: CYSTOSCOPY WITH URETEROSCOPY: SHX5123

## 2018-03-15 HISTORY — PX: CYSTOSCOPY WITH BIOPSY: SHX5122

## 2018-03-15 HISTORY — PX: CYSTOSCOPY WITH FULGERATION: SHX6638

## 2018-03-15 HISTORY — PX: CYSTOSCOPY W/ RETROGRADES: SHX1426

## 2018-03-15 HISTORY — PX: CYSTOSCOPY W/ URETERAL STENT REMOVAL: SHX1430

## 2018-03-15 LAB — GLUCOSE, CAPILLARY
Glucose-Capillary: 188 mg/dL — ABNORMAL HIGH (ref 70–99)
Glucose-Capillary: 188 mg/dL — ABNORMAL HIGH (ref 70–99)

## 2018-03-15 SURGERY — CYSTOSCOPY, WITH BIOPSY
Anesthesia: General | Site: Ureter

## 2018-03-15 MED ORDER — IOPAMIDOL (ISOVUE-200) INJECTION 41%
INTRAVENOUS | Status: DC | PRN
  Administered 2018-03-15: 200 mL

## 2018-03-15 MED ORDER — ONDANSETRON HCL 4 MG/2ML IJ SOLN
INTRAMUSCULAR | Status: DC | PRN
  Administered 2018-03-15: 4 mg via INTRAVENOUS

## 2018-03-15 MED ORDER — LIDOCAINE HCL (PF) 2 % IJ SOLN
INTRAMUSCULAR | Status: AC
  Filled 2018-03-15: qty 10

## 2018-03-15 MED ORDER — ONDANSETRON HCL 4 MG/2ML IJ SOLN: 4.0000 mg | Freq: Once | INTRAMUSCULAR | Status: DC | PRN

## 2018-03-15 MED ORDER — PROPOFOL 500 MG/50ML IV EMUL
INTRAVENOUS | Status: AC
  Filled 2018-03-15: qty 50

## 2018-03-15 MED ORDER — DEXAMETHASONE SODIUM PHOSPHATE 10 MG/ML IJ SOLN
INTRAMUSCULAR | Status: AC
  Filled 2018-03-15: qty 1

## 2018-03-15 MED ORDER — PROPOFOL 10 MG/ML IV BOLUS
INTRAVENOUS | Status: DC | PRN
  Administered 2018-03-15: 100 mg via INTRAVENOUS

## 2018-03-15 MED ORDER — ONDANSETRON HCL 4 MG/2ML IJ SOLN
INTRAMUSCULAR | Status: AC
  Filled 2018-03-15: qty 2

## 2018-03-15 MED ORDER — FAMOTIDINE 20 MG PO TABS
20.0000 mg | ORAL_TABLET | Freq: Once | ORAL | Status: AC
  Administered 2018-03-15: 20 mg via ORAL

## 2018-03-15 MED ORDER — ROCURONIUM BROMIDE 50 MG/5ML IV SOLN
INTRAVENOUS | Status: AC
  Filled 2018-03-15: qty 1

## 2018-03-15 MED ORDER — PHENYLEPHRINE HCL 10 MG/ML IJ SOLN
INTRAMUSCULAR | Status: DC | PRN
  Administered 2018-03-15: 100 ug via INTRAVENOUS

## 2018-03-15 MED ORDER — SUGAMMADEX SODIUM 200 MG/2ML IV SOLN
INTRAVENOUS | Status: DC | PRN
  Administered 2018-03-15: 120 mg via INTRAVENOUS

## 2018-03-15 MED ORDER — PROPOFOL 10 MG/ML IV BOLUS
INTRAVENOUS | Status: AC
  Filled 2018-03-15: qty 20

## 2018-03-15 MED ORDER — FENTANYL CITRATE (PF) 100 MCG/2ML IJ SOLN: 25.0000 ug | INTRAMUSCULAR | Status: DC | PRN

## 2018-03-15 MED ORDER — SODIUM CHLORIDE 0.9 % IV SOLN
INTRAVENOUS | Status: DC
  Administered 2018-03-15: 10:00:00 via INTRAVENOUS

## 2018-03-15 MED ORDER — PHENYLEPHRINE HCL 10 MG/ML IJ SOLN
INTRAMUSCULAR | Status: AC
  Filled 2018-03-15: qty 1

## 2018-03-15 MED ORDER — FENTANYL CITRATE (PF) 100 MCG/2ML IJ SOLN
INTRAMUSCULAR | Status: DC | PRN
  Administered 2018-03-15: 25 ug via INTRAVENOUS
  Administered 2018-03-15: 50 ug via INTRAVENOUS
  Administered 2018-03-15: 25 ug via INTRAVENOUS

## 2018-03-15 MED ORDER — LIDOCAINE HCL (PF) 2 % IJ SOLN
INTRAMUSCULAR | Status: DC | PRN
  Administered 2018-03-15: 100 mg via INTRADERMAL

## 2018-03-15 MED ORDER — MIDAZOLAM HCL 2 MG/2ML IJ SOLN
INTRAMUSCULAR | Status: AC
  Filled 2018-03-15: qty 2

## 2018-03-15 MED ORDER — SUGAMMADEX SODIUM 200 MG/2ML IV SOLN
INTRAVENOUS | Status: AC
  Filled 2018-03-15: qty 2

## 2018-03-15 MED ORDER — MIDAZOLAM HCL 2 MG/2ML IJ SOLN
INTRAMUSCULAR | Status: DC | PRN
  Administered 2018-03-15: 1 mg via INTRAVENOUS

## 2018-03-15 MED ORDER — FENTANYL CITRATE (PF) 100 MCG/2ML IJ SOLN
INTRAMUSCULAR | Status: AC
  Filled 2018-03-15: qty 2

## 2018-03-15 MED ORDER — ROCURONIUM BROMIDE 100 MG/10ML IV SOLN
INTRAVENOUS | Status: DC | PRN
  Administered 2018-03-15: 10 mg via INTRAVENOUS
  Administered 2018-03-15: 30 mg via INTRAVENOUS

## 2018-03-15 MED ORDER — PROPOFOL 500 MG/50ML IV EMUL
INTRAVENOUS | Status: DC | PRN
  Administered 2018-03-15: 100 ug/kg/min via INTRAVENOUS

## 2018-03-15 MED ORDER — DEXAMETHASONE SODIUM PHOSPHATE 10 MG/ML IJ SOLN
INTRAMUSCULAR | Status: DC | PRN
  Administered 2018-03-15: 5 mg via INTRAVENOUS

## 2018-03-15 MED ORDER — CEFAZOLIN SODIUM-DEXTROSE 2-4 GM/100ML-% IV SOLN
INTRAVENOUS | Status: AC
  Filled 2018-03-15: qty 100

## 2018-03-15 MED ORDER — CIPROFLOXACIN IN D5W 400 MG/200ML IV SOLN
INTRAVENOUS | Status: AC
  Filled 2018-03-15: qty 200

## 2018-03-15 MED ORDER — FAMOTIDINE 20 MG PO TABS
ORAL_TABLET | ORAL | Status: AC
  Filled 2018-03-15: qty 1

## 2018-03-15 SURGICAL SUPPLY — 32 items
BAG DRAIN CYSTO-URO LG1000N (MISCELLANEOUS) ×5 IMPLANT
BAG URINE DRAINAGE (UROLOGICAL SUPPLIES) ×5 IMPLANT
BRUSH SCRUB EZ  4% CHG (MISCELLANEOUS) ×2
BRUSH SCRUB EZ 4% CHG (MISCELLANEOUS) ×3 IMPLANT
CATH FOL 2WAY LX 16X30 (CATHETERS) ×5 IMPLANT
CATH FOL 2WAY LX 18X30 (CATHETERS) ×5 IMPLANT
CATH URETL 5X70 OPEN END (CATHETERS) IMPLANT
CONRAY 43 FOR UROLOGY 50M (MISCELLANEOUS) ×5 IMPLANT
DRAPE UTILITY 15X26 TOWEL STRL (DRAPES) ×5 IMPLANT
DRSG TELFA 4X3 1S NADH ST (GAUZE/BANDAGES/DRESSINGS) ×5 IMPLANT
ELECT REM PT RETURN 9FT ADLT (ELECTROSURGICAL) ×5
ELECTRODE REM PT RTRN 9FT ADLT (ELECTROSURGICAL) ×3 IMPLANT
FIBER LASER LITHO 273 (Laser) IMPLANT
GLOVE BIOGEL PI IND STRL 7.5 (GLOVE) ×3 IMPLANT
GLOVE BIOGEL PI INDICATOR 7.5 (GLOVE) ×2
GOWN STRL REUS W/ TWL LRG LVL3 (GOWN DISPOSABLE) ×3 IMPLANT
GOWN STRL REUS W/ TWL XL LVL3 (GOWN DISPOSABLE) ×3 IMPLANT
GOWN STRL REUS W/TWL LRG LVL3 (GOWN DISPOSABLE) ×2
GOWN STRL REUS W/TWL XL LVL3 (GOWN DISPOSABLE) ×2
KIT TURNOVER CYSTO (KITS) ×5 IMPLANT
PACK CYSTO AR (MISCELLANEOUS) ×5 IMPLANT
SENSORWIRE 0.038 NOT ANGLED (WIRE) ×5
SET CYSTO W/LG BORE CLAMP LF (SET/KITS/TRAYS/PACK) ×5 IMPLANT
SOL .9 NS 3000ML IRR  AL (IV SOLUTION) ×2
SOL .9 NS 3000ML IRR UROMATIC (IV SOLUTION) ×3 IMPLANT
STENT URET 6FRX24 CONTOUR (STENTS) IMPLANT
STENT URET 6FRX26 CONTOUR (STENTS) IMPLANT
SURGILUBE 2OZ TUBE FLIPTOP (MISCELLANEOUS) ×5 IMPLANT
SYRINGE IRR TOOMEY STRL 70CC (SYRINGE) ×5 IMPLANT
WATER STERILE IRR 1000ML POUR (IV SOLUTION) ×5 IMPLANT
WATER STERILE IRR 3000ML UROMA (IV SOLUTION) ×5 IMPLANT
WIRE SENSOR 0.038 NOT ANGLED (WIRE) ×3 IMPLANT

## 2018-03-15 NOTE — Telephone Encounter (Signed)
App has been made and patient is aware   ° °Tiffany Manning  °

## 2018-03-15 NOTE — Anesthesia Preprocedure Evaluation (Signed)
Anesthesia Evaluation  Patient identified by MRN, date of birth, ID band Patient awake    Reviewed: Allergy & Precautions, H&P , NPO status , Patient's Chart, lab work & pertinent test results, reviewed documented beta blocker date and time   History of Anesthesia Complications (+) PONV and history of anesthetic complications  Airway Mallampati: II  TM Distance: >3 FB Neck ROM: full    Dental  (+) Upper Dentures, Lower Dentures, Edentulous Upper, Edentulous Lower, Dental Advidsory Given   Pulmonary neg pulmonary ROS,           Cardiovascular Exercise Tolerance: Good hypertension, On Medications (-) angina(-) CAD, (-) Past MI, (-) Cardiac Stents and (-) CABG + dysrhythmias Atrial Fibrillation (-) Valvular Problems/Murmurs     Neuro/Psych negative neurological ROS  negative psych ROS   GI/Hepatic Neg liver ROS, GERD  ,  Endo/Other  diabetes  Renal/GU Renal diseasenegative Renal ROS  negative genitourinary   Musculoskeletal   Abdominal   Peds  Hematology negative hematology ROS (+)   Anesthesia Other Findings Past Medical History: No date: A-fib (HCC) No date: Arthritis No date: Diabetes mellitus without complication (HCC) No date: History of kidney stones No date: Hyperlipidemia No date: Hypertension No date: PONV (postoperative nausea and vomiting) No date: Sepsis (HCC)   Reproductive/Obstetrics negative OB ROS                             Anesthesia Physical  Anesthesia Plan  ASA: III  Anesthesia Plan: General   Post-op Pain Management:    Induction: Intravenous  PONV Risk Score and Plan: 4 or greater and Propofol infusion, TIVA, Ondansetron, Dexamethasone and Treatment may vary due to age or medical condition  Airway Management Planned: LMA  Additional Equipment:   Intra-op Plan:   Post-operative Plan: Extubation in OR  Informed Consent: I have reviewed the patients  History and Physical, chart, labs and discussed the procedure including the risks, benefits and alternatives for the proposed anesthesia with the patient or authorized representative who has indicated his/her understanding and acceptance.   Dental Advisory Given  Plan Discussed with: CRNA  Anesthesia Plan Comments:         Anesthesia Quick Evaluation

## 2018-03-15 NOTE — Anesthesia Procedure Notes (Signed)
Procedure Name: Intubation Date/Time: 03/15/2018 11:14 AM Performed by: Lavone Orn, CRNA Pre-anesthesia Checklist: Patient identified, Emergency Drugs available, Suction available, Patient being monitored and Timeout performed Patient Re-evaluated:Patient Re-evaluated prior to induction Oxygen Delivery Method: Circle system utilized Preoxygenation: Pre-oxygenation with 100% oxygen Induction Type: IV induction Ventilation: Mask ventilation without difficulty Laryngoscope Size: Mac and 3 Grade View: Grade I Tube type: Oral Tube size: 7.0 mm Number of attempts: 1 Airway Equipment and Method: Stylet Placement Confirmation: ETT inserted through vocal cords under direct vision,  positive ETCO2 and breath sounds checked- equal and bilateral Secured at: 20 cm Tube secured with: Tape Dental Injury: Teeth and Oropharynx as per pre-operative assessment

## 2018-03-15 NOTE — OR Nursing (Signed)
Discharge instructionsmdiscussed with pt and husband. Both voice understanding.

## 2018-03-15 NOTE — H&P (Signed)
UROLOGY H&P UPDATE  Agree with prior H&P dated 02/20/2018.  Unique urologic history with recent episode of sepsis with left-sided pyelonephrosis and acute on chronic hydronephrosis managed with bilateral ureteral stent placement by Dr. Annabell Howells.  He was also concerned for possible bladder malignancy at that time.  She also developed idiopathic urinary retention over the last week requiring Foley placement.  She has been afebrile and denies any chest pain or shortness of breath.  Cardiac: RRR Lungs: CTA bilaterally  Laterality: Bilateral Procedure: Cystoscopy, bladder biopsy, bilateral retrograde pyelograms, possible cystogram, possible stent exchange versus removal  Urinalysis: 03/05/2018 greater than 50 RBCs, 20-50 WBCs, rare bacteria, nitrite negative.  Culture grew contaminants only.  She was started on 3 days of Bactrim preoperatively.  Informed consent obtained, we specifically discussed the risk of bleeding, infection/sepsis, possible need for additional procedures, stent related symptoms, and possible recurrence of left pyelonephrosis.   Sondra Come, MD 03/15/2018

## 2018-03-15 NOTE — Anesthesia Post-op Follow-up Note (Signed)
Anesthesia QCDR form completed.        

## 2018-03-15 NOTE — Telephone Encounter (Signed)
-----   Message from Sondra Come, MD sent at 03/15/2018  3:02 PM EST ----- Regarding: follow up Please schedule nurse visit either Wednesday or Thursday next week for foley removal. I will call with pathology results.  Legrand Rams, MD 03/15/2018

## 2018-03-15 NOTE — Discharge Instructions (Signed)

## 2018-03-15 NOTE — Op Note (Signed)
Date of procedure: 03/15/18  Preoperative diagnosis:  1. Bilateral hydronephrosis 2. Urinary retention 3. Recurrent UTIs  Postoperative diagnosis:  1. Same  Procedure: 1. Cystoscopy, bladder biopsy and fulguration 2. Cystogram 3. Bilateral diagnostic ureteroscopy 4. Bilateral retrograde pyelograms with intraoperative interpretation 5. Bilateral stent removal  Surgeon: Legrand Rams, MD  Anesthesia: General  Complications: None  Intraoperative findings:  1.  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  Biopsy and fulguration of white patch at posterior bladder wall. 2.  Small capacity 70 cc bladder, no reflux 3.  Dilated collecting systems bilaterally, no discrete filling defects or strictures 4.  No masses on bilateral diagnostic ureteroscopy 5.  Good efflux from ureters bilaterally at the conclusion of the case, stents not replaced  EBL: Minimal  Specimens: Bladder biopsy  Drains: 18 French two-way Foley  Indication: Tiffany Manning is a 77 y.o. patient with complex urologic history.  She was previously seen by my partner Dr. Annabell Howells when she presented with acute sepsis and bilateral hydronephrosis despite Foley catheter placement.  On prior imaging her left sided hydronephrosis was chronic.  She does have a history of recurrent UTIs.  Dr. and placed bilateral ureteral stents for decompression and she improved.  She also has had episodes of idiopathic urinary retention over the last few months.  After reviewing the management options for treatment, they elected to proceed with the above surgical procedure(s). We have discussed the potential benefits and risks of the procedure, side effects of the proposed treatment, the likelihood of the patient achieving the goals of the procedure, and any potential problems that might occur during the procedure or recuperation. Informed consent has been obtained.  Description of  procedure:  The patient was taken to the operating room and general anesthesia was induced.  The patient was placed in the dorsal lithotomy position, prepped and draped in the usual sterile fashion, and preoperative antibiotics were administered. A preoperative time-out was performed.   A 21 French cystoscope was used to intubate the bladder.  The urethra was grossly normal aside from a very small benign-appearing polyp.  Thorough inspection of the bladder was performed.  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  There were no discrete bladder tumors.  The ureteral orifices were quite lateral bilaterally, and there was a good distal curl in the bilateral ureteral stents.  First, the left ureteral stent was grasped and pulled to the meatus and a sensor wire was advanced to the stent and the stent removed.  A semirigid ureteroscope was then used to perform diagnostic ureteroscopy of the distal and mid ureter.  The scope passed easily into the distal ureter and there were no tumors or concerning findings noted.  The identical procedure was performed on the right, and the semirigid ureteroscope also passed easily into the distal and mid ureter with no concerning findings under visualization.  I then removed the wires bilaterally.    A 16 French Foley was passed into the bladder and 5 cc placed in the balloon.  Under gravity a 50-50 mixture of saline and contrast were allowed to drain into the bladder via a Toomey syringe.  The bladder capacity was only 70 cc.  On fluoroscopy there was no reflux noted.  I then reentered the bladder with a rigid cystoscope and performed bilateral retrograde pyelograms using an access catheter.  There were no filling defects noted on either side.  I then used a cold cup biopsy  forceps to biopsy the posterior bladder wall with there was the white patch noted.  This was then fulgurated with the Bugbee until there is excellent  hemostasis.  In a decompressed state, there was no bleeding noted from the biopsy site.  Close examination of the bilateral ureteral orifices showed excellent efflux of urine bilaterally, and I elected to not replace either ureteral stent.  In the setting of her prior episodes of retention and bladder biopsy, I elected to leave a Foley catheter for 3 to 5 days for bladder healing.  Urine was clear.   Disposition: Stable to PACU  Plan: Etiology of bilateral hydronephrosis from prior hospitalization unclear.  Possibly was secondary to severe urinary tract infection with cystitis and bladder wall thickening.  No obvious findings of malignancy.  We will follow-up pathology.  Follow-up in clinic for void trial in 1 week.  We will discuss strategies for UTI prevention at length including cranberry prophylaxis, likely low-dose of trimethoprim v. Bactrim, and bladder management strategies.  She reportedly has performed CIC in the past, but she is becoming too frail to reliably perform CIC.  We will have to monitor her PVR closely when her Foley is removed.  Legrand Rams, MD 03/15/2018

## 2018-03-15 NOTE — Transfer of Care (Signed)
Immediate Anesthesia Transfer of Care Note  Patient: Tiffany Manning  Procedure(s) Performed: CYSTOSCOPY WITH Bladder BIOPSY (N/A Bladder) CYSTOSCOPY WITH FULGERATION (N/A Bladder) CYSTOSCOPY WITH RETROGRADE PYELOGRAM (Bilateral Ureter) CYSTOGRAM (N/A Bladder) CYSTOSCOPY WITH STENT REMOVAL (Bilateral Ureter) CYSTOSCOPY WITH URETEROSCOPY (Bilateral )  Patient Location: PACU  Anesthesia Type:General  Level of Consciousness: awake  Airway & Oxygen Therapy: Patient Spontanous Breathing and Patient connected to face mask oxygen  Post-op Assessment: Report given to RN and Post -op Vital signs reviewed and stable  Post vital signs: stable  Last Vitals:  Vitals Value Taken Time  BP    Temp    Pulse    Resp    SpO2      Last Pain:  Vitals:   03/15/18 1013  TempSrc: Oral  PainSc: 0-No pain         Complications: No apparent anesthesia complications

## 2018-03-16 NOTE — Anesthesia Postprocedure Evaluation (Signed)
Anesthesia Post Note  Patient: Tiffany Manning  Procedure(s) Performed: CYSTOSCOPY WITH Bladder BIOPSY (N/A Bladder) CYSTOSCOPY WITH FULGERATION (N/A Bladder) CYSTOSCOPY WITH RETROGRADE PYELOGRAM (Bilateral Ureter) CYSTOGRAM (N/A Bladder) CYSTOSCOPY WITH STENT REMOVAL (Bilateral Ureter) CYSTOSCOPY WITH URETEROSCOPY (Bilateral )  Patient location during evaluation: PACU Anesthesia Type: General Level of consciousness: awake and alert Pain management: pain level controlled Vital Signs Assessment: post-procedure vital signs reviewed and stable Respiratory status: spontaneous breathing, nonlabored ventilation, respiratory function stable and patient connected to nasal cannula oxygen Cardiovascular status: blood pressure returned to baseline and stable Postop Assessment: no apparent nausea or vomiting Anesthetic complications: no     Last Vitals:  Vitals:   03/15/18 1334 03/15/18 1419  BP: (!) 155/75 (!) 150/90  Pulse: 85 87  Resp:    Temp: (!) 36.3 C   SpO2: 100% 100%    Last Pain:  Vitals:   03/15/18 1334  TempSrc:   PainSc: 0-No pain                 Lenard Simmer

## 2018-03-17 ENCOUNTER — Encounter: Payer: Self-pay | Admitting: Urology

## 2018-03-18 LAB — SURGICAL PATHOLOGY

## 2018-03-19 ENCOUNTER — Telehealth: Payer: Self-pay

## 2018-03-19 DIAGNOSIS — Z792 Long term (current) use of antibiotics: Secondary | ICD-10-CM

## 2018-03-19 DIAGNOSIS — R339 Retention of urine, unspecified: Secondary | ICD-10-CM

## 2018-03-19 MED ORDER — TRIMETHOPRIM 100 MG PO TABS: 100.0000 mg | ORAL_TABLET | Freq: Every day | ORAL | 11 refills | Status: DC

## 2018-03-19 MED ORDER — TAMSULOSIN HCL 0.4 MG PO CAPS: 0.4000 mg | ORAL_CAPSULE | Freq: Every day | ORAL | 11 refills | Status: DC

## 2018-03-19 NOTE — Telephone Encounter (Signed)
Called pt informed her of the information below. Pt gave verbal understanding. RX for trimethoprim and flomax sent in.

## 2018-03-19 NOTE — Telephone Encounter (Signed)
-----   Message from Sondra Come, MD sent at 03/19/2018  9:31 AM EST ----- Please let her know her bladder biopsy showed inflammation but no cancer. This is good news. She is scheduled for foley removal and void trial Wednesday. Please start her on Trimethoprim 100mg  daily for 30 days with 11 refills for UTI prophylaxis. She should also be on flomax 0.4mg  daily.  Schedule follow up with me in 3-4 weeks with PVR.   Thanks Legrand Rams, MD 03/19/2018

## 2018-03-20 ENCOUNTER — Ambulatory Visit (INDEPENDENT_AMBULATORY_CARE_PROVIDER_SITE_OTHER): Payer: Medicare HMO | Admitting: Family Medicine

## 2018-03-20 DIAGNOSIS — R339 Retention of urine, unspecified: Secondary | ICD-10-CM

## 2018-03-20 NOTE — Progress Notes (Signed)
Catheter Removal  Patient is present today for a catheter removal.  10ml of water was drained from the balloon. A 18FR foley cath was removed from the bladder no complications were noted . Patient tolerated well.  Preformed by: Teressa Lowerarrie Jaegar Croft, CMA  Follow up/ Additional notes: Return by 2:30 for PVR Patient did not return as requested.

## 2018-04-15 ENCOUNTER — Encounter: Payer: Self-pay | Admitting: Urology

## 2018-04-15 ENCOUNTER — Ambulatory Visit: Payer: Medicare HMO | Admitting: Urology

## 2018-04-15 VITALS — BP 145/84 | HR 109 | Ht 60.0 in | Wt 120.0 lb

## 2018-04-15 DIAGNOSIS — N390 Urinary tract infection, site not specified: Secondary | ICD-10-CM

## 2018-04-15 DIAGNOSIS — Z87448 Personal history of other diseases of urinary system: Secondary | ICD-10-CM | POA: Diagnosis not present

## 2018-04-15 DIAGNOSIS — Z87898 Personal history of other specified conditions: Secondary | ICD-10-CM

## 2018-04-15 LAB — BLADDER SCAN AMB NON-IMAGING

## 2018-04-15 NOTE — Progress Notes (Signed)
   04/15/2018 5:14 PM   Tiffany Manning 09/13/1940 [id number]  Reason for visit: Follow up LUTS, hydro, retention, UTI  HPI: I saw [name] back in urology clinic for discussion of the above.  She is a very complex 77 year old female who initially presented in the fall with bilateral hydronephrosis, urinary retention, and urosepsis.  She underwent bilateral ureteral stent placement with Dr. Annabell HowellsWrenn and improved.  She had previously performed occasional intermittent catheterization as an outpatient in the past, however she has become too frail to perform catheterization herself.  She strongly does not want a chronic indwelling Foley catheter.  She underwent surgery with me on November 8 of 2019 for evaluation for possible malignancy as Dr. Annabell HowellsWrenn had suggested from his initial stent placement.  Bilateral ureteroscopy was negative for any tumor, and biopsies of the distal ureters and bladder showed inflammation but no leukoplakia, dysplasia, or malignancy.  Bilateral retrograde pyelogram showed no distinct filling defects or narrowing, and her stents were removed with good efflux bilaterally seen cystoscopically.  She was also started on daily trimethoprim at that time for UTI prophylaxis.  She was also started on Flomax to help with her incomplete emptying.  Since surgery she reports she has been doing very well.  She reports no difficulty with urination, and no episodes of retention.  She also has had no urinary tract infections or symptoms.  PVR in clinic 110 cc.  Assessment & Plan:   In summary, [name] is a 77 year old frail female who previously presented in the fall with bilateral hydronephrosis, retention, and urosepsis that ultimately required bilateral ureteral stent placement by Dr. Annabell HowellsWrenn.  She underwent surgery with me in November 2019 for bladder biopsies and bilateral retrograde pyelograms and diagnostic ureteroscopy which were all negative.  Since starting low-dose trimethoprim  prophylaxis and Flomax, she is done very well and denies any complaints today and is emptying her bladder well.  We discussed that if she developed recurrent retention she would likely require an indwelling catheter either with a urethral Foley or suprapubic tube.  Again she is very resistant to either of those options, however is amenable if she does develop recurrent episodes of idiopathic retention.  Follow-up in 4 months with BMP, renal ultrasound, and PVR  A total of 15 minutes were spent face-to-face with the patient, greater than 50% was spent in patient education, counseling, and coordination of care regarding recurrent UTIs, urinary retention, urinary symptoms, and surgical pathology results.   Sondra ComeBrian C , MD  [facility] [address], [address] [place], [place] [address] [phone]

## 2018-04-16 ENCOUNTER — Ambulatory Visit: Payer: Medicare HMO | Admitting: Urology

## 2018-08-19 ENCOUNTER — Ambulatory Visit: Payer: Medicare HMO | Admitting: Urology

## 2018-09-13 ENCOUNTER — Ambulatory Visit: Payer: Medicare HMO

## 2018-09-25 ENCOUNTER — Ambulatory Visit: Payer: Medicare HMO | Admitting: Urology

## 2018-10-18 ENCOUNTER — Ambulatory Visit: Payer: Medicare HMO

## 2018-10-24 ENCOUNTER — Ambulatory Visit: Payer: Medicare HMO | Admitting: Urology

## 2018-11-20 ENCOUNTER — Ambulatory Visit: Payer: Medicare HMO

## 2018-11-21 ENCOUNTER — Ambulatory Visit: Payer: Medicare HMO | Admitting: Urology

## 2018-11-27 ENCOUNTER — Ambulatory Visit: Payer: Medicare HMO

## 2018-12-02 ENCOUNTER — Ambulatory Visit: Admission: RE | Admit: 2018-12-02 | Payer: Medicare HMO | Source: Ambulatory Visit

## 2018-12-04 ENCOUNTER — Ambulatory Visit: Payer: Medicare HMO | Admitting: Urology

## 2018-12-24 ENCOUNTER — Ambulatory Visit
Admission: RE | Admit: 2018-12-24 | Discharge: 2018-12-24 | Disposition: A | Discharge status: Home / Self Care | Payer: Medicare HMO | Source: Ambulatory Visit | Attending: Urology | Admitting: Urology

## 2018-12-24 ENCOUNTER — Other Ambulatory Visit: Payer: Self-pay

## 2018-12-24 DIAGNOSIS — Z87448 Personal history of other diseases of urinary system: Secondary | ICD-10-CM | POA: Insufficient documentation

## 2018-12-24 DIAGNOSIS — N1330 Unspecified hydronephrosis: Secondary | ICD-10-CM | POA: Diagnosis not present

## 2018-12-24 DIAGNOSIS — R39198 Other difficulties with micturition: Secondary | ICD-10-CM | POA: Insufficient documentation

## 2018-12-24 DIAGNOSIS — Z87898 Personal history of other specified conditions: Secondary | ICD-10-CM

## 2018-12-26 ENCOUNTER — Encounter: Payer: Self-pay | Admitting: Urology

## 2018-12-26 ENCOUNTER — Other Ambulatory Visit: Payer: Self-pay

## 2018-12-26 ENCOUNTER — Ambulatory Visit: Payer: Medicare HMO | Admitting: Urology

## 2018-12-26 VITALS — BP 145/105 | HR 108 | Ht 60.0 in | Wt 135.0 lb

## 2018-12-26 DIAGNOSIS — Z87898 Personal history of other specified conditions: Secondary | ICD-10-CM

## 2018-12-26 DIAGNOSIS — R339 Retention of urine, unspecified: Secondary | ICD-10-CM | POA: Diagnosis not present

## 2018-12-26 DIAGNOSIS — Z87448 Personal history of other diseases of urinary system: Secondary | ICD-10-CM | POA: Diagnosis not present

## 2018-12-26 DIAGNOSIS — N1339 Other hydronephrosis: Secondary | ICD-10-CM | POA: Diagnosis not present

## 2018-12-26 DIAGNOSIS — Z792 Long term (current) use of antibiotics: Secondary | ICD-10-CM | POA: Diagnosis not present

## 2018-12-26 LAB — BLADDER SCAN AMB NON-IMAGING

## 2018-12-26 MED ORDER — TRIMETHOPRIM 100 MG PO TABS: 100.0000 mg | ORAL_TABLET | Freq: Every day | ORAL | 11 refills | Status: DC

## 2018-12-26 MED ORDER — TAMSULOSIN HCL 0.4 MG PO CAPS: 0.4000 mg | ORAL_CAPSULE | Freq: Every day | ORAL | 11 refills | Status: DC

## 2018-12-26 NOTE — Progress Notes (Signed)
   12/26/2018 2:38 PM   [name] [birth year] [id number]  Reason for visit: Follow up chronic left hydro, history of rUTIs  HPI: I saw [name] back in urology clinic today for follow-up of the above.  She is a complex and comorbid 78 year old female.  She has had left-sided hydroureteronephrosis down to the bladder since at least 2013 on review of old imaging.  This is of unclear etiology.  She was admitted with sepsis from urinary source, renal failure, and bilateral hydronephrosis in September 2019 and treated with bilateral ureteral stent placement by my partner Dr. Jeffie Pollock urgently at that time.    She ultimately underwent diagnostic cystoscopy and ureteroscopy with me in follow-up on 03/15/2018.  This showed numerous white patches throughout the bladder but no discrete bladder masses.  Biopsy of the bladder showed inflammation but no dysplasia or malignancy.  The bladder was quite small with only a 70 cc capacity, no reflux seen.  There were no filling defects or strictures on retrograde pyelograms bilaterally.  Bilateral diagnostic ureteroscopy showed no masses.  At our last visit she was doing very well on Flomax and trimethoprim prophylaxis with no complaints or flank pain.  Her baseline renal function is creatinine of 1.3, eGFR 40.  She continues to do well and is voiding with a good stream.  She has only had to perform CIC 1 time in the last 8 months.  She denies any hematuria or flank pain.  Renal ultrasound shows stable left hydro-and atrophic left kidney, and right pelviecaliectasis.  She has not had any UTIs since starting prophylaxis.  She did not have a BMP performed prior to today's visit.  PVR today is 10 mL.   ROS: Please see flowsheet from today's date for complete review of systems.  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  She is currently emptying well with a PVR of 10 mL's.  She has not had any UTIs since starting trimethoprim daily prophylaxis.  She is voiding well on Flomax.  She denies any complaints or flank pain.  We discussed management options including ongoing observation, stent placement, or nephrectomy.  With her co-morbidities, I agree that observation is the best strategy moving forward, and she will continue her trimethoprim prophylaxis and Flomax.  Continue Flomax and trimethoprim RTC 1 year with BMP  A total of 15 minutes were spent face-to-face with the patient, greater than 50% was spent in patient education, counseling, and coordination of care regarding hydronephrosis and recurrent UTIs.   Billey Co, [facility] [address], [place], [place] [address] [phone]

## 2018-12-31 ENCOUNTER — Telehealth: Payer: Self-pay | Admitting: Urology

## 2018-12-31 NOTE — Telephone Encounter (Signed)
Patient was notified per Dr. Doristine Counter note patient is to continue medicaiton

## 2018-12-31 NOTE — Telephone Encounter (Signed)
Pt called and requests a call back, she is unsure about if she is to continue Flomax and Trimpex. Please advise.

## 2019-03-24 ENCOUNTER — Encounter: Payer: Self-pay | Admitting: Emergency Medicine

## 2019-03-24 ENCOUNTER — Inpatient Hospital Stay
Admission: EM | Admit: 2019-03-24 | Discharge: 2019-03-28 | DRG: 683 | Disposition: A | Discharge status: Home / Self Care | Payer: Medicare HMO | Attending: Internal Medicine | Admitting: Internal Medicine

## 2019-03-24 ENCOUNTER — Observation Stay: Payer: Medicare HMO

## 2019-03-24 ENCOUNTER — Other Ambulatory Visit: Payer: Self-pay

## 2019-03-24 DIAGNOSIS — E860 Dehydration: Secondary | ICD-10-CM | POA: Diagnosis present

## 2019-03-24 DIAGNOSIS — E872 Acidosis: Secondary | ICD-10-CM | POA: Diagnosis present

## 2019-03-24 DIAGNOSIS — N136 Pyonephrosis: Secondary | ICD-10-CM | POA: Diagnosis present

## 2019-03-24 DIAGNOSIS — I10 Essential (primary) hypertension: Secondary | ICD-10-CM | POA: Diagnosis present

## 2019-03-24 DIAGNOSIS — Z7984 Long term (current) use of oral hypoglycemic drugs: Secondary | ICD-10-CM

## 2019-03-24 DIAGNOSIS — N390 Urinary tract infection, site not specified: Secondary | ICD-10-CM | POA: Diagnosis not present

## 2019-03-24 DIAGNOSIS — N179 Acute kidney failure, unspecified: Secondary | ICD-10-CM | POA: Diagnosis not present

## 2019-03-24 DIAGNOSIS — E440 Moderate protein-calorie malnutrition: Secondary | ICD-10-CM | POA: Diagnosis present

## 2019-03-24 DIAGNOSIS — Z20828 Contact with and (suspected) exposure to other viral communicable diseases: Secondary | ICD-10-CM | POA: Diagnosis present

## 2019-03-24 DIAGNOSIS — Z66 Do not resuscitate: Secondary | ICD-10-CM | POA: Diagnosis present

## 2019-03-24 DIAGNOSIS — E1129 Type 2 diabetes mellitus with other diabetic kidney complication: Secondary | ICD-10-CM | POA: Diagnosis not present

## 2019-03-24 DIAGNOSIS — E876 Hypokalemia: Secondary | ICD-10-CM | POA: Diagnosis present

## 2019-03-24 DIAGNOSIS — R32 Unspecified urinary incontinence: Secondary | ICD-10-CM | POA: Diagnosis present

## 2019-03-24 DIAGNOSIS — Z87440 Personal history of urinary (tract) infections: Secondary | ICD-10-CM

## 2019-03-24 DIAGNOSIS — M1990 Unspecified osteoarthritis, unspecified site: Secondary | ICD-10-CM | POA: Diagnosis present

## 2019-03-24 DIAGNOSIS — E785 Hyperlipidemia, unspecified: Secondary | ICD-10-CM | POA: Diagnosis present

## 2019-03-24 DIAGNOSIS — Z792 Long term (current) use of antibiotics: Secondary | ICD-10-CM

## 2019-03-24 DIAGNOSIS — Z833 Family history of diabetes mellitus: Secondary | ICD-10-CM

## 2019-03-24 DIAGNOSIS — E119 Type 2 diabetes mellitus without complications: Secondary | ICD-10-CM | POA: Diagnosis present

## 2019-03-24 DIAGNOSIS — Z6824 Body mass index (BMI) 24.0-24.9, adult: Secondary | ICD-10-CM

## 2019-03-24 DIAGNOSIS — I4891 Unspecified atrial fibrillation: Secondary | ICD-10-CM

## 2019-03-24 LAB — URINALYSIS, COMPLETE (UACMP) WITH MICROSCOPIC
Bilirubin Urine: NEGATIVE
Glucose, UA: NEGATIVE mg/dL
Ketones, ur: 5 mg/dL — AB
Nitrite: NEGATIVE
Protein, ur: 100 mg/dL — AB
Specific Gravity, Urine: 1.02 (ref 1.005–1.030)
WBC, UA: >50 WBC/hpf — ABNORMAL HIGH (ref 0–5)
pH: 7 (ref 5.0–8.0)

## 2019-03-24 LAB — CBC WITH DIFFERENTIAL/PLATELET
Abs Immature Granulocytes: 0.1 10*3/uL — ABNORMAL HIGH (ref 0.00–0.07)
Basophils Absolute: 0 10*3/uL (ref 0.0–0.1)
Basophils Relative: 0 %
Eosinophils Absolute: 0 10*3/uL (ref 0.0–0.5)
Eosinophils Relative: 0 %
HCT: 43 % (ref 36.0–46.0)
Hemoglobin: 13.7 g/dL (ref 12.0–15.0)
Immature Granulocytes: 1 %
Lymphocytes Relative: 17 %
Lymphs Abs: 1.9 10*3/uL (ref 0.7–4.0)
MCH: 33.4 pg (ref 26.0–34.0)
MCHC: 31.9 g/dL (ref 30.0–36.0)
MCV: 104.9 fL — ABNORMAL HIGH (ref 80.0–100.0)
Monocytes Absolute: 0.3 10*3/uL (ref 0.1–1.0)
Monocytes Relative: 2 %
Neutro Abs: 8.9 10*3/uL — ABNORMAL HIGH (ref 1.7–7.7)
Neutrophils Relative %: 80 %
Platelets: 244 10*3/uL (ref 150–400)
RBC: 4.1 MIL/uL (ref 3.87–5.11)
RDW: 13.9 % (ref 11.5–15.5)
WBC: 11.2 10*3/uL — ABNORMAL HIGH (ref 4.0–10.5)
nRBC: 0 % (ref 0.0–0.2)

## 2019-03-24 LAB — PROTIME-INR
INR: 1.1 (ref 0.8–1.2)
Prothrombin Time: 13.7 seconds (ref 11.4–15.2)

## 2019-03-24 LAB — APTT: aPTT: 32 seconds (ref 24–36)

## 2019-03-24 LAB — BASIC METABOLIC PANEL
Anion gap: 22 — ABNORMAL HIGH (ref 5–15)
BUN: 43 mg/dL — ABNORMAL HIGH (ref 8–23)
CO2: 12 mmol/L — ABNORMAL LOW (ref 22–32)
Calcium: 9.3 mg/dL (ref 8.9–10.3)
Chloride: 109 mmol/L (ref 98–111)
Creatinine, Ser: 2.33 mg/dL — ABNORMAL HIGH (ref 0.44–1.00)
GFR calc Af Amer: 22 mL/min — ABNORMAL LOW (ref >60)
GFR calc non Af Amer: 19 mL/min — ABNORMAL LOW (ref >60)
Glucose, Bld: 144 mg/dL — ABNORMAL HIGH (ref 70–99)
Potassium: 3.3 mmol/L — ABNORMAL LOW (ref 3.5–5.1)
Sodium: 143 mmol/L (ref 135–145)

## 2019-03-24 LAB — LACTIC ACID, PLASMA: Lactic Acid, Venous: 1.7 mmol/L (ref 0.5–1.9)

## 2019-03-24 LAB — TROPONIN I (HIGH SENSITIVITY): Troponin I (High Sensitivity): 16 ng/L (ref <18)

## 2019-03-24 MED ORDER — ACETAMINOPHEN 650 MG RE SUPP: 650.0000 mg | Freq: Four times a day (QID) | RECTAL | Status: DC | PRN

## 2019-03-24 MED ORDER — DILTIAZEM HCL 25 MG/5ML IV SOLN
15.0000 mg | Freq: Once | INTRAVENOUS | Status: AC
  Administered 2019-03-24: 15 mg via INTRAVENOUS
  Filled 2019-03-24: qty 5

## 2019-03-24 MED ORDER — ACETAMINOPHEN 325 MG PO TABS
650.0000 mg | ORAL_TABLET | Freq: Four times a day (QID) | ORAL | Status: DC | PRN
  Administered 2019-03-25 – 2019-03-28 (×3): 650 mg via ORAL
  Filled 2019-03-24 (×3): qty 2

## 2019-03-24 MED ORDER — SIMVASTATIN 40 MG PO TABS
40.0000 mg | ORAL_TABLET | Freq: Every day | ORAL | Status: DC
  Administered 2019-03-25 – 2019-03-27 (×3): 40 mg via ORAL
  Filled 2019-03-24 (×5): qty 1
  Filled 2019-03-24: qty 4

## 2019-03-24 MED ORDER — SODIUM CHLORIDE 0.9 % IV SOLN
Freq: Once | INTRAVENOUS | Status: AC
  Administered 2019-03-24: 21:00:00 via INTRAVENOUS

## 2019-03-24 MED ORDER — SODIUM CHLORIDE 0.9 % IV SOLN
1.0000 g | Freq: Once | INTRAVENOUS | Status: AC
  Administered 2019-03-24: 1 g via INTRAVENOUS
  Filled 2019-03-24: qty 10

## 2019-03-24 MED ORDER — SODIUM CHLORIDE 0.9 % IV SOLN
2.0000 g | INTRAVENOUS | Status: DC
  Administered 2019-03-25 – 2019-03-26 (×2): 2 g via INTRAVENOUS
  Filled 2019-03-24 (×2): qty 20
  Filled 2019-03-24: qty 2
  Filled 2019-03-24 (×2): qty 20

## 2019-03-24 MED ORDER — ONDANSETRON HCL 4 MG PO TABS
4.0000 mg | ORAL_TABLET | Freq: Four times a day (QID) | ORAL | Status: DC | PRN
  Filled 2019-03-24: qty 1

## 2019-03-24 MED ORDER — HEPARIN BOLUS VIA INFUSION
3000.0000 [IU] | Freq: Once | INTRAVENOUS | Status: AC
  Administered 2019-03-25: 3000 [IU] via INTRAVENOUS
  Filled 2019-03-24: qty 3000

## 2019-03-24 MED ORDER — INSULIN ASPART 100 UNIT/ML ~~LOC~~ SOLN
0.0000 [IU] | Freq: Three times a day (TID) | SUBCUTANEOUS | Status: DC
  Administered 2019-03-26 (×3): 2 [IU] via SUBCUTANEOUS
  Administered 2019-03-27: 1 [IU] via SUBCUTANEOUS
  Administered 2019-03-27: 3 [IU] via SUBCUTANEOUS
  Filled 2019-03-24 (×6): qty 1

## 2019-03-24 MED ORDER — METOPROLOL TARTRATE 50 MG PO TABS
75.0000 mg | ORAL_TABLET | Freq: Two times a day (BID) | ORAL | Status: DC
  Administered 2019-03-24 – 2019-03-28 (×7): 75 mg via ORAL
  Filled 2019-03-24 (×2): qty 1.5
  Filled 2019-03-24 (×5): qty 1
  Filled 2019-03-24: qty 1.5
  Filled 2019-03-24: qty 1
  Filled 2019-03-24: qty 3

## 2019-03-24 MED ORDER — ONDANSETRON HCL 4 MG/2ML IJ SOLN: 4.0000 mg | Freq: Four times a day (QID) | INTRAMUSCULAR | Status: DC | PRN

## 2019-03-24 MED ORDER — TAMSULOSIN HCL 0.4 MG PO CAPS: 0.4000 mg | ORAL_CAPSULE | Freq: Every day | ORAL | Status: DC

## 2019-03-24 MED ORDER — HEPARIN (PORCINE) 25000 UT/250ML-% IV SOLN
850.0000 [IU]/h | INTRAVENOUS | Status: DC
  Administered 2019-03-25: 650 [IU]/h via INTRAVENOUS
  Administered 2019-03-27: 750 [IU]/h via INTRAVENOUS
  Filled 2019-03-24 (×3): qty 250

## 2019-03-24 MED ORDER — METOPROLOL TARTRATE 5 MG/5ML IV SOLN
INTRAVENOUS | Status: AC
  Filled 2019-03-24: qty 5

## 2019-03-24 NOTE — ED Notes (Signed)
Pt stated she felt the need to have a BM, pt  placed on bedpan.

## 2019-03-24 NOTE — ED Notes (Signed)
MD Goodman at bedside. 

## 2019-03-24 NOTE — ED Notes (Signed)
Blue top hemolyzed. This RN asked lab to straight stick pt for blue top recollect.

## 2019-03-24 NOTE — ED Triage Notes (Signed)
Pt presents via acems from doctors office with c/o a-fib with uncontrolled rate. Pt currently 130-140s on ekg monitor. Pt has known hx of afib, but is unsure the last time she took her medication. Pt states she has also had weakness for the last week. Pt currently alert and oriented x4.

## 2019-03-24 NOTE — ED Notes (Signed)
Pt to CT

## 2019-03-24 NOTE — ED Provider Notes (Signed)
[facility] Emergency Department Provider Note   ____________________________________________   I have reviewed the triage vital signs and the nursing notes.   HISTORY  Chief Complaint Weakness   History limited by: Not Limited   HPI Tiffany Manning is a 78 y.o. female who presents to the emergency department today with primary complaint of weakness. The patient states she has felt weak over the past month. With this weakness has come decreased appetite. The patient says that she thinks that her weakness is due to her medications so she stopped taking them 2 days ago. The patient has not had any fevers. Has had occasional episodes of diarrhea but they have been non bloody.   Records reviewed. Per medical record review patient has a history of a-fib. DM.   Past Medical History:  Diagnosis Date  . A-fib ([place])   . Arthritis   . Diabetes mellitus without complication ([place])   . History of kidney stones   . Hyperlipidemia   . Hypertension   . PONV (postoperative nausea and vomiting)   . Sepsis [facility])     Patient Active Problem List   Diagnosis Date Noted  . Atrial fibrillation with rapid ventricular response ([place])   . Severe sepsis ([place]) 01/27/2018  . Pressure injury of skin 01/27/2018  . Diabetic acidosis without coma ([place])   . Leg wound, left 07/10/2017  . Diabetes ([place]) 06/18/2017  . Hypercholesteremia 06/18/2017  . Hypertension 06/18/2017  . Neck abscess 06/18/2017  . Protein-calorie malnutrition, severe 05/29/2017  . Acute renal failure ([place]) 05/28/2017  . Acute renal failure (ARF) ([place]) 05/28/2017  . Sialolithiasis of submandibular gland 11/20/2013    Past Surgical History:  Procedure Laterality Date  . ABDOMINAL HYSTERECTOMY    . BREAST SURGERY Left    lumpectomy  . CATARACT EXTRACTION W/ INTRAOCULAR LENS  IMPLANT, BILATERAL    . CHOLECYSTECTOMY    . CYSTOGRAM N/A 03/15/2018   Procedure: CYSTOGRAM;  Surgeon: Billey Co, MD;   Location: [name] ORS;  Service: Urology;  Laterality: N/A;  . CYSTOSCOPY W/ RETROGRADES Bilateral 03/15/2018   Procedure: CYSTOSCOPY WITH RETROGRADE PYELOGRAM;  Surgeon: Billey Co, MD;  Location: [name] ORS;  Service: Urology;  Laterality: Bilateral;  . CYSTOSCOPY W/ URETERAL STENT REMOVAL Bilateral 03/15/2018   Procedure: CYSTOSCOPY WITH STENT REMOVAL;  Surgeon: Billey Co, MD;  Location: [name] ORS;  Service: Urology;  Laterality: Bilateral;  . CYSTOSCOPY WITH BIOPSY N/A 03/15/2018   Procedure: CYSTOSCOPY WITH Bladder BIOPSY;  Surgeon: Billey Co, MD;  Location: [name] ORS;  Service: Urology;  Laterality: N/A;  . CYSTOSCOPY WITH FULGERATION N/A 03/15/2018   Procedure: CYSTOSCOPY WITH FULGERATION;  Surgeon: Billey Co, MD;  Location: [name] ORS;  Service: Urology;  Laterality: N/A;  . CYSTOSCOPY WITH STENT PLACEMENT Bilateral 01/28/2018   Procedure: CYSTOSCOPY WITH STENT PLACEMENT;  Surgeon: Irine Seal, MD;  Location: [name] ORS;  Service: Urology;  Laterality: Bilateral;  . CYSTOSCOPY WITH URETEROSCOPY Bilateral 03/15/2018   Procedure: CYSTOSCOPY WITH URETEROSCOPY;  Surgeon: Billey Co, MD;  Location: [name] ORS;  Service: Urology;  Laterality: Bilateral;  . SALIVARY STONE REMOVAL    . TONSILLECTOMY      Prior to Admission medications   Medication Sig Start Date End Date Taking? Authorizing Provider  acetaminophen (TYLENOL) 325 MG tablet Take 2 tablets (650 mg total) by mouth every 6 (six) hours as needed for mild pain (or Fever >/= 101). 02/06/18   Loletha Grayer, MD  atorvastatin (LIPITOR) 20 MG tablet  Take 1 tablet (20 mg total) by mouth daily at 6 PM. 02/06/18   Alford HighlandWieting, Richard, MD  diltiazem (CARDIZEM SR) 60 MG 12 hr capsule Take 1 capsule (60 mg total) by mouth every 12 (twelve) hours. 02/06/18   Alford HighlandWieting, Richard, MD  famotidine (PEPCID) 20 MG tablet Take 1 tablet (20 mg total) by mouth daily. 02/06/18   Alford HighlandWieting, Richard, MD  glipiZIDE (GLUCOTROL) 5 MG tablet Take 0.5  tablets (2.5 mg total) by mouth daily before breakfast. 02/06/18 02/06/19  Alford HighlandWieting, Richard, MD  insulin aspart (NOVOLOG) 100 UNIT/ML injection Inject 0-5 Units into the skin at bedtime. 02/06/18   Alford HighlandWieting, Richard, MD  insulin aspart (NOVOLOG) 100 UNIT/ML injection Inject 0-9 Units into the skin 3 (three) times daily with meals. 02/06/18   Wieting, Richard, MD  ipratropium-albuterol (DUONEB) 0.5-2.5 (3) MG/3ML SOLN Take 3 mLs by nebulization every 6 (six) hours. 02/06/18   Alford HighlandWieting, Richard, MD  Metoprolol Tartrate 75 MG TABS Take 75 mg by mouth 2 (two) times daily. 02/06/18   Alford HighlandWieting, Richard, MD  Multiple Vitamin (MULTIVITAMIN WITH MINERALS) TABS tablet Take 1 tablet by mouth daily. 06/07/17   Gouru, Deanna ArtisAruna, MD  ondansetron (ZOFRAN) 4 MG tablet Take 1 tablet (4 mg total) by mouth every 8 (eight) hours as needed for nausea or vomiting. 02/06/18   Alford HighlandWieting, Richard, MD  protein supplement shake (PREMIER PROTEIN) LIQD Take 325 mLs (11 oz total) by mouth 2 (two) times daily between meals. 02/06/18   Alford HighlandWieting, Richard, MD  simvastatin (ZOCOR) 40 MG tablet  04/01/18   [provider]  sodium chloride flush (NS) 0.9 % SOLN 10 mLs by Intracatheter route every 12 (twelve) hours. 02/06/18   Alford HighlandWieting, Richard, MD  tamsulosin (FLOMAX) 0.4 MG CAPS capsule Take 1 capsule (0.4 mg total) by mouth daily. 12/26/18   Sondra ComeSninsky, Brian C, MD  triamterene-hydrochlorothiazide (DYAZIDE) 37.5-25 MG capsule Take 1 capsule by mouth daily. Pt take it 2 times a day    [provider]  trimethoprim (TRIMPEX) 100 MG tablet Take 1 tablet (100 mg total) by mouth daily. 12/26/18   Sondra ComeSninsky, Brian C, MD  TRUE METRIX BLOOD GLUCOSE TEST test strip  03/14/18   [provider]  vitamin C (VITAMIN C) 250 MG tablet Take 1 tablet (250 mg total) by mouth 2 (two) times daily. 02/06/18   Alford HighlandWieting, Richard, MD    Allergies Sulfa antibiotics  Family History  Problem Relation Age of Onset  . Diabetes Sister     Social  History Social History   Tobacco Use  . Smoking status: Never Smoker  . Smokeless tobacco: Never Used  Substance Use Topics  . Alcohol use: No    Frequency: Never  . Drug use: No    Review of Systems Constitutional: No fever/chills Eyes: No visual changes. ENT: No sore throat. Cardiovascular: Denies chest pain. Respiratory: Denies shortness of breath. Gastrointestinal: No abdominal pain.  Positive for occasional diarrhea.  Genitourinary: Negative for dysuria. Musculoskeletal: Negative for back pain. Skin: Negative for rash. Neurological: Negative for headaches, focal weakness or numbness.  ____________________________________________   PHYSICAL EXAM:  VITAL SIGNS: ED Triage Vitals [03/24/19 1744]  Enc Vitals Group     BP      Pulse      Resp      Temp (!) 97.3 F (36.3 C)     Temp Source Oral     SpO2      Weight 125 lb (56.7 kg)     Height 5' (1.524 m)  Head Circumference      Peak Flow      Pain Score 0   Constitutional: Alert and oriented.  Eyes: Conjunctivae are normal.  ENT      Head: Normocephalic and atraumatic.      Nose: No congestion/rhinnorhea.      Mouth/Throat: Mucous membranes are moist.      Neck: No stridor. Hematological/Lymphatic/Immunilogical: No cervical lymphadenopathy. Cardiovascular: Irregularly irregular rhythm.  No murmurs, rubs, or gallops.  Respiratory: Normal respiratory effort without tachypnea nor retractions. Breath sounds are clear and equal bilaterally. No wheezes/rales/rhonchi. Gastrointestinal: Soft and non tender. No rebound. No guarding.  Genitourinary: Deferred Musculoskeletal: Normal range of motion in all extremities. No lower extremity edema. Neurologic:  Normal speech and language. No gross focal neurologic deficits are appreciated.  Skin:  Skin is warm, dry and intact. No rash noted. Psychiatric: Mood and affect are normal. Speech and behavior are normal. Patient exhibits appropriate insight and  judgment.  ____________________________________________    LABS (pertinent positives/negatives)  BMP na 143, k 3.3, glu 144, cr 2.33 Trop hs 16 UA turbid, large leukocytes, >50 wbc, wbc clumps present CBC wbc 11.2, hgb 13.7, plt 244  ____________________________________________   EKG  I, Phineas Semen, attending physician, personally viewed and interpreted this EKG  EKG Time: 1740 Rate: 152 Rhythm: atrial fibrillation with RVR Axis: right axis deviation Intervals: qtc 483 QRS: narrow ST changes: no st elevation Impression: abnormal ekg  ____________________________________________    RADIOLOGY  None  ____________________________________________   PROCEDURES  Procedures  ____________________________________________   INITIAL IMPRESSION / ASSESSMENT AND PLAN / ED COURSE  Pertinent labs & imaging results that were available during my care of the patient were reviewed by me and considered in my medical decision making (see chart for details).   Patient presented to the emergency department today because of concerns for weakness.  Upon initial presentation patient was in A. fib with RVR.  Patient does states she has been taking her medicine for the past 2 days.  She was given 1 dose of diltiazem which did result in good rate control.  Additionally the patient was found to have acute kidney injury as well as findings consistent with urinary tract infection.  Will start IV fluids and antibiotics.  Will plan on admission.  Discussed findings with patient and family.  ____________________________________________   FINAL CLINICAL IMPRESSION(S) / ED DIAGNOSES  Final diagnoses:  Lower urinary tract infectious disease  AKI (acute kidney injury) (HCC)     Note: This dictation was prepared with Dragon dictation. Any transcriptional errors that result from this process are unintentional    Phineas Semen, MD 03/24/19 2031

## 2019-03-24 NOTE — Progress Notes (Signed)
ANTICOAGULATION CONSULT NOTE - Initial Consult  Pharmacy Consult for heparin Indication: atrial fibrillation  Allergies  Allergen Reactions  . Sulfa Antibiotics Nausea And Vomiting    Patient Measurements: Height: 5' (152.4 cm) Weight: 125 lb (56.7 kg) IBW/kg (Calculated) : 45.5 Heparin Dosing Weight: 50 kg  Vital Signs: Temp: 97.3 F (36.3 C) (11/16 1744) Temp Source: Oral (11/16 1744) BP: 142/73 (11/16 2142) Pulse Rate: 108 (11/16 2142)  Labs: Recent Labs    03/24/19 1747  HGB 13.7  HCT 43.0  PLT 244  CREATININE 2.33*  TROPONINIHS 16    Estimated Creatinine Clearance: 15.5 mL/min (A) (by C-G formula based on SCr of 2.33 mg/dL (H)).   Medical History: Past Medical History:  Diagnosis Date  . A-fib ([place])   . Arthritis   . Diabetes mellitus without complication ([place])   . History of kidney stones   . Hyperlipidemia   . Hypertension   . PONV (postoperative nausea and vomiting)   . Sepsis ([place])     Medications:  Scheduled:  . heparin  3,000 Units Intravenous Once  . [START ON 03/25/2019] insulin aspart  0-9 Units Subcutaneous TID WC  . metoprolol tartrate      . metoprolol tartrate  75 mg Oral BID  . [START ON 03/25/2019] simvastatin  40 mg Oral Daily  . tamsulosin  0.4 mg Oral Daily    Assessment: Patient admitted for weakness found to have hydronephrosis s/t to pyelonephritis and is in afib w/ RVR apparently new onset no anticoagulation PTA. Patient is being started on heparin drip for anticoagulation for afib.  Goal of Therapy:  Heparin level 0.3-0.7 units/ml Monitor platelets by anticoagulation protocol: Yes   Plan:  Will bolus w/ heparin 3000 units IV x 1 Will start rate at 650 units/hr. Baseline labs WNL. APTT, INR pending. Will check anti-Xa @ 0700 and monitor daily CBC's and adjust per anti-Xa levels.  Tobie Lords, PharmD, BCPS Clinical Pharmacist 03/24/2019,10:22 PM

## 2019-03-24 NOTE — ED Notes (Signed)
Pharmacy informed this RN to collect blue top before initiating heparin therapy. This RN sent blue top to lab at this time.

## 2019-03-24 NOTE — H&P (Addendum)
History and Physical    SELDA JALBERT FBP:[id number] DOB: [birth year] DOA: 03/24/2019  PCP: Albina Billet, MD  Patient coming from: Home.  Chief Complaint: Weakness.  HPI: Tiffany Manning is a 78 y.o. female with history of hypertension, hyperlipidemia, recurrent urinary tract infection on chronic suppressive antibiotic, diabetes mellitus, A. fib was brought to the ER after patient was found to be increasingly weak over the last 2 weeks with poor appetite and at times having nausea vomiting.  Patient did not have any abdominal pain had some occasional diarrhea sometimes feels short of breath.  Has not been eating well for last 2 weeks.  Has been able to take her medications.  Has recently visited her urologist.  ED Course: In the ER patient was found to be in A. fib with RVR was given 1 dose of IV Cardizem following which heart rate improved.  Patient lab work show elevated creatinine from 1.3 in October last year and is around 2.3 with sodium of 143 anion gap of 22 blood glucose 144 WBC 11.3 hemoglobin 13.7 platelets 244 UA is concerning for UTI.  CT renal study was done which shows features concerning for left-sided hydroureteronephrosis.  No obstructing stone seen.  Patient started on ceftriaxone IV fluids admitted for A. fib with RVR with acute renal failure and UTI.  Review of Systems: As per HPI, rest all negative.   Past Medical History:  Diagnosis Date  . A-fib ([place])   . Arthritis   . Diabetes mellitus without complication ([place])   . History of kidney stones   . Hyperlipidemia   . Hypertension   . PONV (postoperative nausea and vomiting)   . Sepsis [facility])     Past Surgical History:  Procedure Laterality Date  . ABDOMINAL HYSTERECTOMY    . BREAST SURGERY Left    lumpectomy  . CATARACT EXTRACTION W/ INTRAOCULAR LENS  IMPLANT, BILATERAL    . CHOLECYSTECTOMY    . CYSTOGRAM N/A 03/15/2018   Procedure: CYSTOGRAM;  Surgeon: Billey Co, MD;  Location: [name] ORS;  Service:  Urology;  Laterality: N/A;  . CYSTOSCOPY W/ RETROGRADES Bilateral 03/15/2018   Procedure: CYSTOSCOPY WITH RETROGRADE PYELOGRAM;  Surgeon: Billey Co, MD;  Location: [name] ORS;  Service: Urology;  Laterality: Bilateral;  . CYSTOSCOPY W/ URETERAL STENT REMOVAL Bilateral 03/15/2018   Procedure: CYSTOSCOPY WITH STENT REMOVAL;  Surgeon: Billey Co, MD;  Location: [name] ORS;  Service: Urology;  Laterality: Bilateral;  . CYSTOSCOPY WITH BIOPSY N/A 03/15/2018   Procedure: CYSTOSCOPY WITH Bladder BIOPSY;  Surgeon: Billey Co, MD;  Location: [name] ORS;  Service: Urology;  Laterality: N/A;  . CYSTOSCOPY WITH FULGERATION N/A 03/15/2018   Procedure: CYSTOSCOPY WITH FULGERATION;  Surgeon: Billey Co, MD;  Location: [name] ORS;  Service: Urology;  Laterality: N/A;  . CYSTOSCOPY WITH STENT PLACEMENT Bilateral 01/28/2018   Procedure: CYSTOSCOPY WITH STENT PLACEMENT;  Surgeon: Irine Seal, MD;  Location: [name] ORS;  Service: Urology;  Laterality: Bilateral;  . CYSTOSCOPY WITH URETEROSCOPY Bilateral 03/15/2018   Procedure: CYSTOSCOPY WITH URETEROSCOPY;  Surgeon: Billey Co, MD;  Location: [name] ORS;  Service: Urology;  Laterality: Bilateral;  . SALIVARY STONE REMOVAL    . TONSILLECTOMY       reports that she has never smoked. She has never used smokeless tobacco. She reports that she does not drink alcohol or use drugs.  Allergies  Allergen Reactions  . Sulfa Antibiotics Nausea And Vomiting    Family History  Problem Relation Age  of Onset  . Diabetes Sister     Prior to Admission medications   Medication Sig Start Date End Date Taking? Authorizing Provider  acetaminophen (TYLENOL) 325 MG tablet Take 2 tablets (650 mg total) by mouth every 6 (six) hours as needed for mild pain (or Fever >/= 101). 02/06/18  Yes Alford HighlandWieting, Richard, MD  glipiZIDE (GLUCOTROL) 5 MG tablet Take 0.5 tablets (2.5 mg total) by mouth daily before breakfast. 02/06/18 03/24/19 Yes Wieting, Richard, MD  Metoprolol  Tartrate 75 MG TABS Take 75 mg by mouth 2 (two) times daily. 02/06/18  Yes Wieting, Richard, MD  ranitidine (ZANTAC) 150 MG capsule Take 150 mg by mouth 2 (two) times daily as needed for heartburn.   Yes [provider]  simvastatin (ZOCOR) 40 MG tablet Take 40 mg by mouth daily.  04/01/18  Yes [provider]  tamsulosin (FLOMAX) 0.4 MG CAPS capsule Take 1 capsule (0.4 mg total) by mouth daily. 12/26/18  Yes Sondra ComeSninsky, Brian C, MD  triamterene-hydrochlorothiazide (DYAZIDE) 37.5-25 MG capsule Take 1 capsule by mouth 2 (two) times daily.    Yes [provider]  trimethoprim (TRIMPEX) 100 MG tablet Take 1 tablet (100 mg total) by mouth daily. 12/26/18  Yes Sondra ComeSninsky, Brian C, MD    Physical Exam: Constitutional: Moderately built and nourished. Vitals:   03/24/19 2030 03/24/19 2045 03/24/19 2100 03/24/19 2142  BP: 130/73  (!) 142/73 (!) 142/73  Pulse: (!) 266   (!) 108  Resp: 19 15 (!) 26   Temp:      TempSrc:      SpO2: (!) 59%     Weight:      Height:       Eyes: Anicteric no pallor. ENMT: No discharge from the ears eyes nose or mouth. Neck: No mass felt.  No neck rigidity. Respiratory: No rhonchi or crepitations. Cardiovascular: S1-S2 heard. Abdomen: Soft nontender bowel sounds present. Musculoskeletal: No edema. Skin: No rash. Neurologic: Alert awake oriented to time place and person.  Moves all extremities. Psychiatric: Appears normal per normal affect.   Labs on Admission: I have personally reviewed following labs and imaging studies  CBC: Recent Labs  Lab 03/24/19 1747  WBC 11.2*  NEUTROABS 8.9*  HGB 13.7  HCT 43.0  MCV 104.9*  PLT 244   Basic Metabolic Panel: Recent Labs  Lab 03/24/19 1747  NA 143  K 3.3*  CL 109  CO2 12*  GLUCOSE 144*  BUN 43*  CREATININE 2.33*  CALCIUM 9.3   GFR: Estimated Creatinine Clearance: 15.5 mL/min (A) (by C-G formula based on SCr of 2.33 mg/dL (H)). Liver Function Tests: No results for input(s): AST,  ALT, ALKPHOS, BILITOT, PROT, ALBUMIN in the last 168 hours. No results for input(s): LIPASE, AMYLASE in the last 168 hours. No results for input(s): AMMONIA in the last 168 hours. Coagulation Profile: No results for input(s): INR, PROTIME in the last 168 hours. Cardiac Enzymes: No results for input(s): CKTOTAL, CKMB, CKMBINDEX, TROPONINI in the last 168 hours. BNP (last 3 results) No results for input(s): PROBNP in the last 8760 hours. HbA1C: No results for input(s): HGBA1C in the last 72 hours. CBG: No results for input(s): GLUCAP in the last 168 hours. Lipid Profile: No results for input(s): CHOL, HDL, LDLCALC, TRIG, CHOLHDL, LDLDIRECT in the last 72 hours. Thyroid Function Tests: No results for input(s): TSH, T4TOTAL, FREET4, T3FREE, THYROIDAB in the last 72 hours. Anemia Panel: No results for input(s): VITAMINB12, FOLATE, FERRITIN, TIBC, IRON, RETICCTPCT in the last 72  hours. Urine analysis:    Component Value Date/Time   COLORURINE AMBER (A) 03/24/2019 1747   APPEARANCEUR TURBID (A) 03/24/2019 1747   APPEARANCEUR Cloudy 10/18/2011 1616   LABSPEC 1.020 03/24/2019 1747   LABSPEC 1.009 10/18/2011 1616   PHURINE 7.0 03/24/2019 1747   GLUCOSEU NEGATIVE 03/24/2019 1747   GLUCOSEU Negative 10/18/2011 1616   HGBUR SMALL (A) 03/24/2019 1747   BILIRUBINUR NEGATIVE 03/24/2019 1747   BILIRUBINUR Negative 10/18/2011 1616   KETONESUR 5 (A) 03/24/2019 1747   PROTEINUR 100 (A) 03/24/2019 1747   NITRITE NEGATIVE 03/24/2019 1747   LEUKOCYTESUR LARGE (A) 03/24/2019 1747   LEUKOCYTESUR 2+ 10/18/2011 1616   Sepsis Labs: (procalcitonin:4,lacticidven:4) )No results found for this or any previous visit (from the past 240 hour(s)).   Radiological Exams on Admission: Ct Renal Stone Study  Result Date: 03/24/2019 CLINICAL DATA:  Flank pain EXAM: CT ABDOMEN AND PELVIS WITHOUT CONTRAST TECHNIQUE: Multidetector CT imaging of the abdomen and pelvis was performed following the standard  protocol without IV contrast. COMPARISON:  Ultrasound 12/24/2018, CT 01/28/2018, 09/19/2011 FINDINGS: Lower chest: Stable nodularity in the right middle lobe. 5 mm right middle lobe pulmonary nodule, decreased compared to prior measurement of 7 mm. No acute consolidation or effusion. Coronary vascular calcification. Heart size within normal limits. Small hiatal hernia. Hepatobiliary: No focal hepatic abnormality. Status post cholecystectomy. No significant biliary dilatation Pancreas: No acute inflammatory changes. Pancreas is atrophic and contains a few parenchymal calcifications. Fifteen mm hypodense nodule adjacent to or exophytic to the pancreas body, stable to slight increase in size. Spleen: Normal in size without focal abnormality. Adrenals/Urinary Tract: Adrenal glands are normal. Lobulated atrophic left kidney. Moderate left and mild right hydronephrosis, decreased compared to prior study. Left hydroureter with stranding about the distal left ureter. No stones. Mild increased density within the posterior bladder. Small cyst lower pole left kidney. Stomach/Bowel: The stomach is nonenlarged. No dilated small bowel. Collapsed appearance of transverse colon. Possible wall thickening of the ascending colon. Vascular/Lymphatic: Moderate aortic atherosclerosis without aneurysm. No significantly enlarged lymph nodes. Reproductive: Status post hysterectomy. No adnexal masses. Other: Negative for free air or free fluid. Moderate fat containing infraumbilical hernia. Musculoskeletal: No acute or suspicious osseous abnormality. Similar scattered foci of sclerosis within the hips and pelvis. IMPRESSION: 1. Moderate left and mild right hydronephrosis with left hydroureter, though decreased compared to the prior CT. No ureteral stone is seen. Left ureter is dilated down to the level of the bladder. Cortical scarring and atrophy of the left kidney. 2. Mild increased density at the posterior bladder, possible small stones.  3. 15 mm hypodense nodule possibly arising from the pancreatic body. Nonemergent MRI recommended for further evaluation given increasing size as compared with prior exams. 4. 5 mm right middle lobe pulmonary nodule, decreased in size 5. Possible wall thickening of the ascending colon though without significant surrounding inflammatory change. Correlation with colonoscopy suggested to exclude mass in the region. 6. Moderate fat containing infraumbilical ventral hernia Electronically Signed   By: Jasmine Pang M.D.   On: 03/24/2019 21:31    EKG: Independently reviewed.  A. fib with RVR.  Assessment/Plan Active Problems:   Atrial fibrillation with rapid ventricular response (H[facility])   ARF (acute renal failure) (HCC)   Acute lower UTI   DM (diabetes mellitus), type 2 with renal complications (H[facility])    1. A. fib with RVR -patient's heart rate improved with 1 dose of IV Cardizem bolus and fluids.  We will continue metoprolol 75 p.o. twice daily  for which I have ordered ordered 1 dose now.  Patient has not been on anticoagulation will start on heparin for now and further assess if patient needs to be on long-term anticoagulation.  Chads 2 vasc score is at least 4. 2. Acute renal failure likely from poor oral intake and dehydration.  Hydrate and recheck metabolic panel.  Hold triamterene hydrochlorothiazide.  Follow intake output metabolic panel. 3. History of recurrent UTI with acute lower urinary tract infection likely causing patient's nausea vomiting.  On ceftriaxone follow cultures. 4. Chronic left-sided hydroureteronephrosis being followed by urologist.  Per urology note planning for stent or other procedure.  At this time CT scan does not show any acute obstruction.  Will get opinion from urologist. 5. Diabetes mellitus type 2 we will keep patient on sliding scale coverage. 6. Hypertension on metoprolol holding triamterene/HCTZ due to dehydration and renal failure.  Follow blood pressure trends. 7.  Hyperlipidemia on statins. 8. Chronic left lower extremity wound -we will consult wound team.  COVID-19 test is pending.   DVT prophylaxis: Heparin. Code Status: DNR confirmed with patient's husband. Family Communication: Patient's husband. Disposition Plan: Home. Consults called: We will request urology consult for morning. Admission status: Observation.   Eduard Clos MD [facility] Pager [phone].  If 7PM-7AM, please contact night-coverage [email or web address] Password [facility]  03/24/2019, 9:57 PM

## 2019-03-25 DIAGNOSIS — Z66 Do not resuscitate: Secondary | ICD-10-CM | POA: Diagnosis present

## 2019-03-25 DIAGNOSIS — E119 Type 2 diabetes mellitus without complications: Secondary | ICD-10-CM | POA: Diagnosis present

## 2019-03-25 DIAGNOSIS — I4891 Unspecified atrial fibrillation: Secondary | ICD-10-CM | POA: Diagnosis present

## 2019-03-25 DIAGNOSIS — E860 Dehydration: Secondary | ICD-10-CM | POA: Diagnosis present

## 2019-03-25 DIAGNOSIS — E872 Acidosis, unspecified: Secondary | ICD-10-CM | POA: Insufficient documentation

## 2019-03-25 DIAGNOSIS — Z6824 Body mass index (BMI) 24.0-24.9, adult: Secondary | ICD-10-CM | POA: Diagnosis not present

## 2019-03-25 DIAGNOSIS — E876 Hypokalemia: Secondary | ICD-10-CM | POA: Diagnosis present

## 2019-03-25 DIAGNOSIS — Z20828 Contact with and (suspected) exposure to other viral communicable diseases: Secondary | ICD-10-CM | POA: Diagnosis present

## 2019-03-25 DIAGNOSIS — N179 Acute kidney failure, unspecified: Secondary | ICD-10-CM | POA: Diagnosis present

## 2019-03-25 DIAGNOSIS — E1129 Type 2 diabetes mellitus with other diabetic kidney complication: Secondary | ICD-10-CM | POA: Diagnosis not present

## 2019-03-25 DIAGNOSIS — Z7984 Long term (current) use of oral hypoglycemic drugs: Secondary | ICD-10-CM | POA: Diagnosis not present

## 2019-03-25 DIAGNOSIS — R32 Unspecified urinary incontinence: Secondary | ICD-10-CM | POA: Diagnosis present

## 2019-03-25 DIAGNOSIS — M1990 Unspecified osteoarthritis, unspecified site: Secondary | ICD-10-CM | POA: Diagnosis present

## 2019-03-25 DIAGNOSIS — Z833 Family history of diabetes mellitus: Secondary | ICD-10-CM | POA: Diagnosis not present

## 2019-03-25 DIAGNOSIS — N390 Urinary tract infection, site not specified: Secondary | ICD-10-CM | POA: Diagnosis present

## 2019-03-25 DIAGNOSIS — N1330 Unspecified hydronephrosis: Secondary | ICD-10-CM

## 2019-03-25 DIAGNOSIS — Z792 Long term (current) use of antibiotics: Secondary | ICD-10-CM | POA: Diagnosis not present

## 2019-03-25 DIAGNOSIS — E440 Moderate protein-calorie malnutrition: Secondary | ICD-10-CM | POA: Diagnosis present

## 2019-03-25 DIAGNOSIS — E785 Hyperlipidemia, unspecified: Secondary | ICD-10-CM | POA: Diagnosis present

## 2019-03-25 DIAGNOSIS — N136 Pyonephrosis: Secondary | ICD-10-CM | POA: Diagnosis present

## 2019-03-25 DIAGNOSIS — I10 Essential (primary) hypertension: Secondary | ICD-10-CM | POA: Diagnosis present

## 2019-03-25 DIAGNOSIS — Z87440 Personal history of urinary (tract) infections: Secondary | ICD-10-CM | POA: Diagnosis not present

## 2019-03-25 LAB — COMPREHENSIVE METABOLIC PANEL
ALT: 45 U/L — ABNORMAL HIGH (ref 0–44)
ALT: 51 U/L — ABNORMAL HIGH (ref 0–44)
AST: 25 U/L (ref 15–41)
AST: 26 U/L (ref 15–41)
Albumin: 3 g/dL — ABNORMAL LOW (ref 3.5–5.0)
Albumin: 3.2 g/dL — ABNORMAL LOW (ref 3.5–5.0)
Alkaline Phosphatase: 103 U/L (ref 38–126)
Alkaline Phosphatase: 92 U/L (ref 38–126)
Anion gap: 13 (ref 5–15)
Anion gap: 17 — ABNORMAL HIGH (ref 5–15)
BUN: 35 mg/dL — ABNORMAL HIGH (ref 8–23)
BUN: 40 mg/dL — ABNORMAL HIGH (ref 8–23)
CO2: 12 mmol/L — ABNORMAL LOW (ref 22–32)
CO2: 16 mmol/L — ABNORMAL LOW (ref 22–32)
Calcium: 8.3 mg/dL — ABNORMAL LOW (ref 8.9–10.3)
Calcium: 8.3 mg/dL — ABNORMAL LOW (ref 8.9–10.3)
Chloride: 112 mmol/L — ABNORMAL HIGH (ref 98–111)
Chloride: 112 mmol/L — ABNORMAL HIGH (ref 98–111)
Creatinine, Ser: 1.73 mg/dL — ABNORMAL HIGH (ref 0.44–1.00)
Creatinine, Ser: 1.93 mg/dL — ABNORMAL HIGH (ref 0.44–1.00)
GFR calc Af Amer: 28 mL/min — ABNORMAL LOW (ref >60)
GFR calc Af Amer: 32 mL/min — ABNORMAL LOW (ref >60)
GFR calc non Af Amer: 24 mL/min — ABNORMAL LOW (ref >60)
GFR calc non Af Amer: 28 mL/min — ABNORMAL LOW (ref >60)
Glucose, Bld: 159 mg/dL — ABNORMAL HIGH (ref 70–99)
Glucose, Bld: 96 mg/dL (ref 70–99)
Potassium: 2.8 mmol/L — ABNORMAL LOW (ref 3.5–5.1)
Potassium: 2.9 mmol/L — ABNORMAL LOW (ref 3.5–5.1)
Sodium: 141 mmol/L (ref 135–145)
Sodium: 141 mmol/L (ref 135–145)
Total Bilirubin: 0.9 mg/dL (ref 0.3–1.2)
Total Bilirubin: 1 mg/dL (ref 0.3–1.2)
Total Protein: 5.8 g/dL — ABNORMAL LOW (ref 6.5–8.1)
Total Protein: 6.3 g/dL — ABNORMAL LOW (ref 6.5–8.1)

## 2019-03-25 LAB — CBC WITH DIFFERENTIAL/PLATELET
Abs Immature Granulocytes: 0.06 10*3/uL (ref 0.00–0.07)
Abs Immature Granulocytes: 0.08 10*3/uL — ABNORMAL HIGH (ref 0.00–0.07)
Basophils Absolute: 0.1 10*3/uL (ref 0.0–0.1)
Basophils Absolute: 0.1 10*3/uL (ref 0.0–0.1)
Basophils Relative: 1 %
Basophils Relative: 1 %
Eosinophils Absolute: 0 10*3/uL (ref 0.0–0.5)
Eosinophils Absolute: 0 10*3/uL (ref 0.0–0.5)
Eosinophils Relative: 0 %
Eosinophils Relative: 0 %
HCT: 36.4 % (ref 36.0–46.0)
HCT: 38.6 % (ref 36.0–46.0)
Hemoglobin: 11.8 g/dL — ABNORMAL LOW (ref 12.0–15.0)
Hemoglobin: 12.3 g/dL (ref 12.0–15.0)
Immature Granulocytes: 1 %
Immature Granulocytes: 1 %
Lymphocytes Relative: 20 %
Lymphocytes Relative: 25 %
Lymphs Abs: 2.1 10*3/uL (ref 0.7–4.0)
Lymphs Abs: 2.9 10*3/uL (ref 0.7–4.0)
MCH: 34 pg (ref 26.0–34.0)
MCH: 34.1 pg — ABNORMAL HIGH (ref 26.0–34.0)
MCHC: 31.9 g/dL (ref 30.0–36.0)
MCHC: 32.4 g/dL (ref 30.0–36.0)
MCV: 105.2 fL — ABNORMAL HIGH (ref 80.0–100.0)
MCV: 106.6 fL — ABNORMAL HIGH (ref 80.0–100.0)
Monocytes Absolute: 0.4 10*3/uL (ref 0.1–1.0)
Monocytes Absolute: 0.5 10*3/uL (ref 0.1–1.0)
Monocytes Relative: 4 %
Monocytes Relative: 4 %
Neutro Abs: 7.7 10*3/uL (ref 1.7–7.7)
Neutro Abs: 7.8 10*3/uL — ABNORMAL HIGH (ref 1.7–7.7)
Neutrophils Relative %: 69 %
Neutrophils Relative %: 74 %
Platelets: 210 10*3/uL (ref 150–400)
Platelets: 233 10*3/uL (ref 150–400)
RBC: 3.46 MIL/uL — ABNORMAL LOW (ref 3.87–5.11)
RBC: 3.62 MIL/uL — ABNORMAL LOW (ref 3.87–5.11)
RDW: 13.9 % (ref 11.5–15.5)
RDW: 14.1 % (ref 11.5–15.5)
WBC: 10.5 10*3/uL (ref 4.0–10.5)
WBC: 11.3 10*3/uL — ABNORMAL HIGH (ref 4.0–10.5)
nRBC: 0 % (ref 0.0–0.2)
nRBC: 0 % (ref 0.0–0.2)

## 2019-03-25 LAB — TSH: TSH: 1.964 u[IU]/mL (ref 0.350–4.500)

## 2019-03-25 LAB — GLUCOSE, CAPILLARY
Glucose-Capillary: 89 mg/dL (ref 70–99)
Glucose-Capillary: 65 mg/dL — ABNORMAL LOW (ref 70–99)
Glucose-Capillary: 147 mg/dL — ABNORMAL HIGH (ref 70–99)
Glucose-Capillary: 137 mg/dL — ABNORMAL HIGH (ref 70–99)

## 2019-03-25 LAB — HEPARIN LEVEL (UNFRACTIONATED)
Heparin Unfractionated: 0.59 IU/mL (ref 0.30–0.70)
Heparin Unfractionated: 0.48 IU/mL (ref 0.30–0.70)

## 2019-03-25 LAB — URINE CULTURE

## 2019-03-25 LAB — TROPONIN I (HIGH SENSITIVITY): Troponin I (High Sensitivity): 19 ng/L — ABNORMAL HIGH (ref <18)

## 2019-03-25 LAB — SARS CORONAVIRUS 2 (TAT 6-24 HRS): SARS Coronavirus 2: NEGATIVE

## 2019-03-25 LAB — MAGNESIUM: Magnesium: 1.9 mg/dL (ref 1.7–2.4)

## 2019-03-25 MED ORDER — LOPERAMIDE HCL 2 MG PO CAPS
2.0000 mg | ORAL_CAPSULE | ORAL | Status: DC | PRN
  Administered 2019-03-25: 2 mg via ORAL
  Filled 2019-03-25: qty 1

## 2019-03-25 MED ORDER — SODIUM BICARBONATE 8.4 % IV SOLN: INTRAVENOUS | Status: DC

## 2019-03-25 MED ORDER — SODIUM BICARBONATE-DEXTROSE 150-5 MEQ/L-% IV SOLN
150.0000 meq | INTRAVENOUS | Status: DC
  Administered 2019-03-25 – 2019-03-26 (×2): 150 meq via INTRAVENOUS
  Filled 2019-03-25 (×7): qty 1000

## 2019-03-25 MED ORDER — POTASSIUM CHLORIDE 20 MEQ PO PACK
40.0000 meq | PACK | ORAL | Status: AC
  Administered 2019-03-25: 40 meq via ORAL
  Filled 2019-03-25: qty 2

## 2019-03-25 MED ORDER — TAMSULOSIN HCL 0.4 MG PO CAPS
0.4000 mg | ORAL_CAPSULE | Freq: Every day | ORAL | Status: DC
  Administered 2019-03-25 – 2019-03-27 (×3): 0.4 mg via ORAL
  Filled 2019-03-25 (×5): qty 1

## 2019-03-25 NOTE — Progress Notes (Signed)
[place] for heparin Indication: atrial fibrillation  Allergies  Allergen Reactions  . Sulfa Antibiotics Nausea And Vomiting    Patient Measurements: Height: 5' (152.4 cm) Weight: 125 lb (56.7 kg) IBW/kg (Calculated) : 45.5 Heparin Dosing Weight:   Vital Signs: BP: 110/64 (11/17 0700) Pulse Rate: 86 (11/17 0700)  Labs: Recent Labs    03/24/19 1747 03/24/19 2331 03/24/19 2332 03/25/19 0020 03/25/19 0559  HGB 13.7  --   --  12.3  --   HCT 43.0  --   --  38.6  --   PLT 244  --   --  233  --   APTT  --   --  32  --   --   LABPROT  --   --  13.7  --   --   INR  --   --  1.1  --   --   HEPARINUNFRC  --   --   --   --  0.59  CREATININE 2.33*  --   --  1.93*  --   TROPONINIHS 16 19*  --   --   --     Estimated Creatinine Clearance: 18.7 mL/min (A) (by C-G formula based on SCr of 1.93 mg/dL (H)).   Medical History: Past Medical History:  Diagnosis Date  . A-fib ([place])   . Arthritis   . Diabetes mellitus without complication ([place])   . History of kidney stones   . Hyperlipidemia   . Hypertension   . PONV (postoperative nausea and vomiting)   . Sepsis ([place])     Medications:  Scheduled:  . insulin aspart  0-9 Units Subcutaneous TID WC  . metoprolol tartrate      . metoprolol tartrate  75 mg Oral BID  . simvastatin  40 mg Oral Daily  . tamsulosin  0.4 mg Oral Daily    Assessment: Patient admitted for weakness found to have hydronephrosis s/t to pyelonephritis and is in afib w/ RVR apparently new onset no anticoagulation PTA. Patient is being started on heparin drip for anticoagulation for afib.  11/16: Will bolus w/ heparin 3000 units IV x 1 Will start rate at 650 units/hr. 11/17 0559 HL=0.59. Will continue current rate  Goal of Therapy:  Heparin level 0.3-0.7 units/ml Monitor platelets by anticoagulation protocol: Yes   Plan:  11/17 0559 HL=0.59. Will continue current rate of 650 units/hr and check confirmatory level  in 8 hrs. Will monitor daily CBC's and adjust per anti-Xa levels.  Chinita Greenland PharmD Clinical Pharmacist 03/25/2019

## 2019-03-25 NOTE — Consult Note (Signed)
I have placed a request via Secure Chat to Dr.Thompson  requesting photos of the wound areas of concern to be placed in the EMR.   Paulette [place] MSN,RN,CWOCN, CNS, CWON-AP [phone]  

## 2019-03-25 NOTE — ED Notes (Signed)
Lab at bedside

## 2019-03-25 NOTE — Consult Note (Signed)
Urology Consult  I have been asked to see the patient by Dr. Janee Morn, for evaluation and management of possible UTI.  Chief Complaint: Weakness  History of Present Illness: Tiffany Manning is a 78 y.o. year old female who presented to the ED yesterday with a 2-week history of weakness and anorexia.  She was subsequently found to have A. fib with RVR and elevated creatinine.  UA with 11-20 RBCs/hpf, >50 WBCs/hpf, and 11-20 squamous epithelial cells.  CT stone study with moderate left and mild right hydronephrosis without obstructing stones.  Creatinine 1.73 this afternoon, down from 1.93 at admission.  WBC count 10.5 this afternoon, down from 11.3 at admission.  Urine and blood cultures pending.  On antibiotics as below.  She is an established [facility] patient with chronic hydronephrosis and recurrent UTIs, managed by Dr. Richardo Hanks.  She reports dysuria since her arrival in the hospital, however she denies dysuria prior to this.  She denies flank pain.  She denies other voiding symptoms.  Anti-infectives (From admission, onward)   Start     Dose/Rate Route Frequency Ordered Stop   03/25/19 2100  cefTRIAXone (ROCEPHIN) 2 g in sodium chloride 0.9 % 100 mL IVPB     2 g 200 mL/hr over 30 Minutes Intravenous Every 24 hours 03/24/19 2157     03/24/19 2015  cefTRIAXone (ROCEPHIN) 1 g in sodium chloride 0.9 % 100 mL IVPB     1 g 200 mL/hr over 30 Minutes Intravenous  Once 03/24/19 2000 03/24/19 2142     Past Medical History:  Diagnosis Date   A-fib (HCC)    Arthritis    Diabetes mellitus without complication (HCC)    History of kidney stones    Hyperlipidemia    Hypertension    PONV (postoperative nausea and vomiting)    Sepsis (HCC)     Past Surgical History:  Procedure Laterality Date   ABDOMINAL HYSTERECTOMY     BREAST SURGERY Left    lumpectomy   CATARACT EXTRACTION W/ INTRAOCULAR LENS  IMPLANT, BILATERAL     CHOLECYSTECTOMY     CYSTOGRAM N/A 03/15/2018   Procedure: CYSTOGRAM;  Surgeon: Sondra Come, MD;  Location: [name];  Service: Urology;  Laterality: N/A;   CYSTOSCOPY W/ RETROGRADES Bilateral 03/15/2018   Procedure: CYSTOSCOPY WITH RETROGRADE PYELOGRAM;  Surgeon: Sondra Come, MD;  Location: [name];  Service: Urology;  Laterality: Bilateral;   CYSTOSCOPY W/ URETERAL STENT REMOVAL Bilateral 03/15/2018   Procedure: CYSTOSCOPY WITH STENT REMOVAL;  Surgeon: Sondra Come, MD;  Location: [name];  Service: Urology;  Laterality: Bilateral;   CYSTOSCOPY WITH BIOPSY N/A 03/15/2018   Procedure: CYSTOSCOPY WITH Bladder BIOPSY;  Surgeon: Sondra Come, MD;  Location: [name];  Service: Urology;  Laterality: N/A;   CYSTOSCOPY WITH FULGERATION N/A 03/15/2018   Procedure: CYSTOSCOPY WITH FULGERATION;  Surgeon: Sondra Come, MD;  Location: [name];  Service: Urology;  Laterality: N/A;   CYSTOSCOPY WITH STENT PLACEMENT Bilateral 01/28/2018   Procedure: CYSTOSCOPY WITH STENT PLACEMENT;  Surgeon: Bjorn Pippin, MD;  Location: [name];  Service: Urology;  Laterality: Bilateral;   CYSTOSCOPY WITH URETEROSCOPY Bilateral 03/15/2018   Procedure: CYSTOSCOPY WITH URETEROSCOPY;  Surgeon: Sondra Come, MD;  Location: [name];  Service: Urology;  Laterality: Bilateral;   SALIVARY STONE REMOVAL     TONSILLECTOMY      Home Medications:  Current Meds  Medication Sig   acetaminophen (TYLENOL) 325 MG tablet Take 2 tablets (650 mg total)  by mouth every 6 (six) hours as needed for mild pain (or Fever >/= 101).   glipiZIDE (GLUCOTROL) 5 MG tablet Take 0.5 tablets (2.5 mg total) by mouth daily before breakfast.   Metoprolol Tartrate 75 MG TABS Take 75 mg by mouth 2 (two) times daily.   ranitidine (ZANTAC) 150 MG capsule Take 150 mg by mouth 2 (two) times daily as needed for heartburn.   simvastatin (ZOCOR) 40 MG tablet Take 40 mg by mouth daily.    tamsulosin (FLOMAX) 0.4 MG CAPS capsule Take 1 capsule (0.4 mg total) by mouth daily.    triamterene-hydrochlorothiazide (DYAZIDE) 37.5-25 MG capsule Take 1 capsule by mouth 2 (two) times daily.    trimethoprim (TRIMPEX) 100 MG tablet Take 1 tablet (100 mg total) by mouth daily.    Allergies:  Allergies  Allergen Reactions   Sulfa Antibiotics Nausea And Vomiting    Family History  Problem Relation Age of Onset   Diabetes Sister     Social History:  reports that she has never smoked. She has never used smokeless tobacco. She reports that she does not drink alcohol or use drugs.  ROS: A complete review of systems was performed.  All systems are negative except for pertinent findings as noted.  Physical Exam:  Vital signs in last 24 hours: Temp:  [97.3 F (36.3 C)] 97.3 F (36.3 C) (11/16 1744) Pulse Rate:  [78-266] 85 (11/17 1230) Resp:  [14-27] 17 (11/17 1230) BP: (95-142)/(54-86) 113/61 (11/17 1230) SpO2:  [59 %-100 %] 100 % (11/17 1230) Weight:  [56.7 kg] 56.7 kg (11/16 1744) Constitutional: Awake and alert, no acute distress, fatigued appearing HEENT: [place] AT, moist mucus membranes Cardiovascular: No clubbing, cyanosis, or edema. Respiratory: Normal respiratory effort GI: Abdomen is soft, nontender.  Suprapubic fullness noted. Skin: No rashes, bruises or suspicious lesions Neurologic: Lying in bed, unable to assess Psychiatric: Normal mood and affect  Laboratory Data:  Recent Labs    03/24/19 1747 03/25/19 0020 03/25/19 1132  WBC 11.2* 11.3* 10.5  HGB 13.7 12.3 11.8*  HCT 43.0 38.6 36.4   Recent Labs    03/24/19 1747 03/25/19 0020 03/25/19 1132  NA 143 141 141  K 3.3* 2.8* 2.9*  CL 109 112* 112*  CO2 12* 12* 16*  GLUCOSE 144* 96 159*  BUN 43* 40* 35*  CREATININE 2.33* 1.93* 1.73*  CALCIUM 9.3 8.3* 8.3*   Recent Labs    03/24/19 2332  INR 1.1   Urinalysis    Component Value Date/Time   COLORURINE AMBER (A) 03/24/2019 1747   APPEARANCEUR TURBID (A) 03/24/2019 1747   APPEARANCEUR Cloudy 10/18/2011 1616   LABSPEC 1.020 03/24/2019  1747   LABSPEC 1.009 10/18/2011 1616   PHURINE 7.0 03/24/2019 1747   GLUCOSEU NEGATIVE 03/24/2019 1747   GLUCOSEU Negative 10/18/2011 1616   HGBUR SMALL (A) 03/24/2019 1747   BILIRUBINUR NEGATIVE 03/24/2019 1747   BILIRUBINUR Negative 10/18/2011 1616   KETONESUR 5 (A) 03/24/2019 1747   PROTEINUR 100 (A) 03/24/2019 1747   NITRITE NEGATIVE 03/24/2019 1747   LEUKOCYTESUR LARGE (A) 03/24/2019 1747   LEUKOCYTESUR 2+ 10/18/2011 1616   Results for orders placed or performed during the hospital encounter of 03/24/19  SARS CORONAVIRUS 2 (TAT 6-24 HRS) Nasopharyngeal Nasopharyngeal Swab     Status: None   Collection Time: 03/24/19  8:57 PM   Specimen: Nasopharyngeal Swab  Result Value Ref Range Status   SARS Coronavirus 2 NEGATIVE NEGATIVE Final    Comment: (NOTE) SARS-CoV-2 target nucleic acids are NOT  DETECTED. The SARS-CoV-2 RNA is generally detectable in upper and lower respiratory specimens during the acute phase of infection. Negative results do not preclude SARS-CoV-2 infection, do not rule out co-infections with other pathogens, and should not be used as the sole basis for treatment or other patient management decisions. Negative results must be combined with clinical observations, patient history, and epidemiological information. The expected result is Negative. Fact Sheet for Patients: SugarRoll.be Fact Sheet for Healthcare Providers: https://www.woods-mathews.com/ This test is not yet approved or cleared by the [place] FDA and  has been authorized for detection and/or diagnosis of SARS-CoV-2 by FDA under an Emergency Use Authorization (EUA). This EUA will remain  in effect (meaning this test can be used) for the duration of the COVID-19 declaration under Section 56 4(b)(1) of the Act, 21 U.S.C. section 360bbb-3(b)(1), unless the authorization is terminated or revoked sooner. Performed at [facility], [place] [address]  [place], [place] [address]     Radiologic Imaging: Ct Renal Stone Study  Result Date: 03/24/2019 CLINICAL DATA:  Flank pain EXAM: CT ABDOMEN AND PELVIS WITHOUT CONTRAST TECHNIQUE: Multidetector CT imaging of the abdomen and pelvis was performed following the standard protocol without IV contrast. COMPARISON:  Ultrasound 12/24/2018, CT 01/28/2018, 09/19/2011 FINDINGS: Lower chest: Stable nodularity in the right middle lobe. 5 mm right middle lobe pulmonary nodule, decreased compared to prior measurement of 7 mm. No acute consolidation or effusion. Coronary vascular calcification. Heart size within normal limits. Small hiatal hernia. Hepatobiliary: No focal hepatic abnormality. Status post cholecystectomy. No significant biliary dilatation Pancreas: No acute inflammatory changes. Pancreas is atrophic and contains a few parenchymal calcifications. Fifteen mm hypodense nodule adjacent to or exophytic to the pancreas body, stable to slight increase in size. Spleen: Normal in size without focal abnormality. Adrenals/Urinary Tract: Adrenal glands are normal. Lobulated atrophic left kidney. Moderate left and mild right hydronephrosis, decreased compared to prior study. Left hydroureter with stranding about the distal left ureter. No stones. Mild increased density within the posterior bladder. Small cyst lower pole left kidney. Stomach/Bowel: The stomach is nonenlarged. No dilated small bowel. Collapsed appearance of transverse colon. Possible wall thickening of the ascending colon. Vascular/Lymphatic: Moderate aortic atherosclerosis without aneurysm. No significantly enlarged lymph nodes. Reproductive: Status post hysterectomy. No adnexal masses. Other: Negative for free air or free fluid. Moderate fat containing infraumbilical hernia. Musculoskeletal: No acute or suspicious osseous abnormality. Similar scattered foci of sclerosis within the hips and pelvis. IMPRESSION: 1. Moderate left and mild right hydronephrosis with  left hydroureter, though decreased compared to the prior CT. No ureteral stone is seen. Left ureter is dilated down to the level of the bladder. Cortical scarring and atrophy of the left kidney. 2. Mild increased density at the posterior bladder, possible small stones. 3. 15 mm hypodense nodule possibly arising from the pancreatic body. Nonemergent MRI recommended for further evaluation given increasing size as compared with prior exams. 4. 5 mm right middle lobe pulmonary nodule, decreased in size 5. Possible wall thickening of the ascending colon though without significant surrounding inflammatory change. Correlation with colonoscopy suggested to exclude mass in the region. 6. Moderate fat containing infraumbilical ventral hernia Electronically Signed   By: Donavan Foil M.D.   On: 03/24/2019 21:31   I personally reviewed the imaging above and note bilateral hydronephrosis.  Assessment & Plan:  78 year old comorbid female admitted with a 2-week history of weakness and anorexia.  Subsequently found to have A. fib with RVR.  UA concerning for infection, however  with contaminated sample.  Urine and blood cultures pending.  Patient has hydronephrosis at baseline.  Creatinine normalized with IV fluids.  She is asymptomatic, no urgent intervention or stenting required at this point.  Recommend treating her as a possible UTI in light of her medical history.   Recommendations: -PVR, with Foley placed for volume >25000mL or difficulty urinating -Follow urine culture, tx with appropriate antibiotics -Contact us if she develops fever or other voiding symptoms -Outpatient follow-up in 4-6 weeks  Thank you for involving me in this patient's care, please page with any further questions or concerns.  Tiffany ChingSamantha Sindy Mccune, PA-C 03/25/2019 2:31 PM

## 2019-03-25 NOTE — Consult Note (Signed)
Will update with conservative wound care orders until in person assessment can be completed.  Bedside nurse report minimal oozing and chronic in nature. Not clear if this patient is followed outpatient for this wound.   [place], [place], [place]

## 2019-03-25 NOTE — ED Notes (Signed)
CBG 65; pt provided cup of Orange Juice.

## 2019-03-25 NOTE — ED Notes (Signed)
Pt noted to have diarrhea. Pt cleaned and peri-care performed by this RN. New brief and bed sheet applied. Purewick removed by this RN due to pt having diarrhea.

## 2019-03-25 NOTE — Progress Notes (Signed)
[place] for heparin Indication: atrial fibrillation  Allergies  Allergen Reactions  . Sulfa Antibiotics Nausea And Vomiting    Patient Measurements: Height: 5' (152.4 cm) Weight: 125 lb (56.7 kg) IBW/kg (Calculated) : 45.5 Heparin Dosing Weight:   Vital Signs: BP: 113/61 (11/17 1230) Pulse Rate: 85 (11/17 1230)  Labs: Recent Labs    03/24/19 1747 03/24/19 2331 03/24/19 2332 03/25/19 0020 03/25/19 0559 03/25/19 1132 03/25/19 1529  HGB 13.7  --   --  12.3  --  11.8*  --   HCT 43.0  --   --  38.6  --  36.4  --   PLT 244  --   --  233  --  210  --   APTT  --   --  32  --   --   --   --   LABPROT  --   --  13.7  --   --   --   --   INR  --   --  1.1  --   --   --   --   HEPARINUNFRC  --   --   --   --  0.59  --  0.48  CREATININE 2.33*  --   --  1.93*  --  1.73*  --   TROPONINIHS 16 19*  --   --   --   --   --     Estimated Creatinine Clearance: 20.8 mL/min (A) (by C-G formula based on SCr of 1.73 mg/dL (H)).   Medical History: Past Medical History:  Diagnosis Date  . A-fib ([place])   . Arthritis   . Diabetes mellitus without complication ([place])   . History of kidney stones   . Hyperlipidemia   . Hypertension   . PONV (postoperative nausea and vomiting)   . Sepsis ([place])     Medications:  Scheduled:  . insulin aspart  0-9 Units Subcutaneous TID WC  . metoprolol tartrate  75 mg Oral BID  . potassium chloride  40 mEq Oral Q4H  . simvastatin  40 mg Oral Daily  . tamsulosin  0.4 mg Oral Daily    Assessment: Patient admitted for weakness found to have hydronephrosis s/t to pyelonephritis and is in afib w/ RVR apparently new onset no anticoagulation PTA. Patient is being started on heparin drip for anticoagulation for afib.  11/16: Will bolus w/ heparin 3000 units IV x 1 Will start rate at 650 units/hr. 11/17 0559 HL=0.59. Will continue current rate 11/17 1529 HL=0.48. Level was scheduled for 1400 today and was drawn 1.5  hours late. HL is therapeutic x2.   Goal of Therapy:  Heparin level 0.3-0.7 units/ml Monitor platelets by anticoagulation protocol: Yes   Plan:  Will continue current rate of 650 units/hr and check with AM labs.  Will monitor daily CBC's and adjust per anti-Xa levels.  Kristeen Miss PharmD Clinical Pharmacist 03/25/2019

## 2019-03-25 NOTE — ED Notes (Signed)
Pt given lunch tray and water to drink. 

## 2019-03-25 NOTE — ED Notes (Signed)
Lab notified to collect additional blood specimens.

## 2019-03-25 NOTE — Progress Notes (Signed)
PROGRESS NOTE    Tiffany Manning  NID:[id number] DOB: 09/30/1940 DOA: 03/24/2019 PCP: Albina Billet, MD    Brief Narrative:  HPI per Dr. Marlou Sa is a 78 y.o. female with history of hypertension, hyperlipidemia, recurrent urinary tract infection on chronic suppressive antibiotic, diabetes mellitus, A. fib was brought to the ER after patient was found to be increasingly weak over the last 2 weeks with poor appetite and at times having nausea vomiting.  Patient did not have any abdominal pain had some occasional diarrhea sometimes feels short of breath.  Has not been eating well for last 2 weeks.  Has been able to take her medications.  Has recently visited her urologist.  ED Course: In the ER patient was found to be in A. fib with RVR was given 1 dose of IV Cardizem following which heart rate improved.  Patient lab work show elevated creatinine from 1.3 in October last year and is around 2.3 with sodium of 143 anion gap of 22 blood glucose 144 WBC 11.3 hemoglobin 13.7 platelets 244 UA is concerning for UTI.  CT renal study was done which shows features concerning for left-sided hydroureteronephrosis.  No obstructing stone seen.  Patient started on ceftriaxone IV fluids admitted for A. fib with RVR with acute renal failure and UTI.  Assessment & Plan:   Active Problems:   Atrial fibrillation with rapid ventricular response (HCC)   ARF (acute renal failure) (HCC)   Acute lower UTI   DM (diabetes mellitus), type 2 with renal complications (HCC)  1 A. fib with RVR Likely secondary to acute infectious etiology.  Patient with history of A. fib.  Patient heart rate noted to improve after dose of IV Cardizem bolus.  Patient denies any chest pain.  Continue home regimen metoprolol 75 mg twice daily for rate control.  Patient noted to not be on anticoagulation long-term.  CHA2DS2-VASc of at least 4.  Patient started on heparin for now will need to be assessed further  2.  Acute  renal failure/metabolic acidosis Likely multifactorial secondary to prerenal azotemia from poor oral intake, nausea vomiting dehydration in the setting of chronic left-sided hydroureteronephrosis and mild right hydronephrosis.  Urinalysis concerning for UTI.  Urinalysis with 100 protein.  Urine electrolytes not done.  Check a comprehensive metabolic profile as creatinine was 2.33 on admission.  Creatinine trending down.  Patient was given IV fluids which have currently been discontinued.  Will place on a bicarb drip as patient noted with metabolic acidosis likely secondary to worsening renal function.  Follow.  3.  Hypokalemia Magnesium level at 1.9.  K. Dur 40 mEq p.o. every 4 hours x2 doses.  Follow.  4.  Recurrent UTI with acute lower urinary tract infection Likely etiology of patient's nausea and vomiting.  Urine cultures ordered and are pending.  Continue IV Rocephin.  Follow.  5.  Chronic left-sided hydrouteronephrosis Being followed by urology in the outpatient setting.  Per urology note plan was for stent or other procedure.  CT with no acute obstruction noted however patient acute renal failure.  Consult with urology for further evaluation and management.  6.  Diabetes mellitus type 2 Hemoglobin A1c was 7.8 on 01/30/2018.  Repeat hemoglobin A1c.  CBG of 85 this morning.  Place on IV fluids.  Sliding scale insulin.  7.  Hypertension Triamterene HCTZ on hold secondary to dehydration and acute renal failure.  Continue metoprolol.  8.  Hyperlipidemia Continue statin.  9.  Chronic left lower extremity  wound Wound care consulted.   DVT prophylaxis: Heparin Code Status: DNR Family Communication: Updated patient and husband at bedside. Disposition Plan: To be determined.   Consultants:   Urology pending  Procedures:   CT stone protocol 03/24/2019  Antimicrobials:   IV Rocephin 03/24/2019   Subjective: Patient laying on gurney.  Husband at bedside.  Patient denies any  further emesis.  States feeling a little bit better than on admission.  Denies any chest pain.  No shortness of breath.  States she is making urine.  Denies any abdominal pain.  Objective: Vitals:   03/25/19 0400 03/25/19 0500 03/25/19 0600 03/25/19 0700  BP: 117/64 (!) 95/54 104/73 110/64  Pulse: 79 78 82 86  Resp: 19 18 17 17   Temp:      TempSrc:      SpO2: 100% 100% 100% 99%  Weight:      Height:        Intake/Output Summary (Last 24 hours) at 03/25/2019 1017 Last data filed at 03/25/2019 0700 Gross per 24 hour  Intake 1100 ml  Output -  Net 1100 ml   Filed Weights   03/24/19 1744  Weight: 56.7 kg    Examination:  General exam: Appears calm and comfortable.  Dry mucous membranes. Respiratory system: Clear to auscultation. Respiratory effort normal. Cardiovascular system: Irregularly irregular.  No JVD, murmurs, rubs, gallops or clicks. No pedal edema. Gastrointestinal system: Abdomen is nondistended, soft and nontender. No organomegaly or masses felt. Normal bowel sounds heard. Central nervous system: Alert and oriented. No focal neurological deficits. Extremities: Symmetric 5 x 5 power. Skin: Left lower extremity superficial wound.  Psychiatry: Judgement and insight appear normal. Mood & affect appropriate.     Data Reviewed: I have personally reviewed following labs and imaging studies  CBC: Recent Labs  Lab 03/24/19 1747 03/25/19 0020  WBC 11.2* 11.3*  NEUTROABS 8.9* 7.8*  HGB 13.7 12.3  HCT 43.0 38.6  MCV 104.9* 106.6*  PLT 244 233   Basic Metabolic Panel: Recent Labs  Lab 03/24/19 1747 03/25/19 0020  NA 143 141  K 3.3* 2.8*  CL 109 112*  CO2 12* 12*  GLUCOSE 144* 96  BUN 43* 40*  CREATININE 2.33* 1.93*  CALCIUM 9.3 8.3*   GFR: Estimated Creatinine Clearance: 18.7 mL/min (A) (by C-G formula based on SCr of 1.93 mg/dL (H)). Liver Function Tests: Recent Labs  Lab 03/25/19 0020  AST 25  ALT 51*  ALKPHOS 103  BILITOT 0.9  PROT 6.3*   ALBUMIN 3.2*   No results for input(s): LIPASE, AMYLASE in the last 168 hours. No results for input(s): AMMONIA in the last 168 hours. Coagulation Profile: Recent Labs  Lab 03/24/19 2332  INR 1.1   Cardiac Enzymes: No results for input(s): CKTOTAL, CKMB, CKMBINDEX, TROPONINI in the last 168 hours. BNP (last 3 results) No results for input(s): PROBNP in the last 8760 hours. HbA1C: No results for input(s): HGBA1C in the last 72 hours. CBG: Recent Labs  Lab 03/25/19 0223 03/25/19 0751  GLUCAP 89 65*   Lipid Profile: No results for input(s): CHOL, HDL, LDLCALC, TRIG, CHOLHDL, LDLDIRECT in the last 72 hours. Thyroid Function Tests: Recent Labs    03/24/19 2332  TSH 1.964   Anemia Panel: No results for input(s): VITAMINB12, FOLATE, FERRITIN, TIBC, IRON, RETICCTPCT in the last 72 hours. Sepsis Labs: Recent Labs  Lab 03/24/19 2017  LATICACIDVEN 1.7    No results found for this or any previous visit (from the past 240 hour(s)).  Radiology Studies: Ct Renal Stone Study  Result Date: 03/24/2019 CLINICAL DATA:  Flank pain EXAM: CT ABDOMEN AND PELVIS WITHOUT CONTRAST TECHNIQUE: Multidetector CT imaging of the abdomen and pelvis was performed following the standard protocol without IV contrast. COMPARISON:  Ultrasound 12/24/2018, CT 01/28/2018, 09/19/2011 FINDINGS: Lower chest: Stable nodularity in the right middle lobe. 5 mm right middle lobe pulmonary nodule, decreased compared to prior measurement of 7 mm. No acute consolidation or effusion. Coronary vascular calcification. Heart size within normal limits. Small hiatal hernia. Hepatobiliary: No focal hepatic abnormality. Status post cholecystectomy. No significant biliary dilatation Pancreas: No acute inflammatory changes. Pancreas is atrophic and contains a few parenchymal calcifications. Fifteen mm hypodense nodule adjacent to or exophytic to the pancreas body, stable to slight increase in size. Spleen: Normal in size  without focal abnormality. Adrenals/Urinary Tract: Adrenal glands are normal. Lobulated atrophic left kidney. Moderate left and mild right hydronephrosis, decreased compared to prior study. Left hydroureter with stranding about the distal left ureter. No stones. Mild increased density within the posterior bladder. Small cyst lower pole left kidney. Stomach/Bowel: The stomach is nonenlarged. No dilated small bowel. Collapsed appearance of transverse colon. Possible wall thickening of the ascending colon. Vascular/Lymphatic: Moderate aortic atherosclerosis without aneurysm. No significantly enlarged lymph nodes. Reproductive: Status post hysterectomy. No adnexal masses. Other: Negative for free air or free fluid. Moderate fat containing infraumbilical hernia. Musculoskeletal: No acute or suspicious osseous abnormality. Similar scattered foci of sclerosis within the hips and pelvis. IMPRESSION: 1. Moderate left and mild right hydronephrosis with left hydroureter, though decreased compared to the prior CT. No ureteral stone is seen. Left ureter is dilated down to the level of the bladder. Cortical scarring and atrophy of the left kidney. 2. Mild increased density at the posterior bladder, possible small stones. 3. 15 mm hypodense nodule possibly arising from the pancreatic body. Nonemergent MRI recommended for further evaluation given increasing size as compared with prior exams. 4. 5 mm right middle lobe pulmonary nodule, decreased in size 5. Possible wall thickening of the ascending colon though without significant surrounding inflammatory change. Correlation with colonoscopy suggested to exclude mass in the region. 6. Moderate fat containing infraumbilical ventral hernia Electronically Signed   By: Jasmine Pang M.D.   On: 03/24/2019 21:31        Scheduled Meds: . insulin aspart  0-9 Units Subcutaneous TID WC  . metoprolol tartrate  75 mg Oral BID  . simvastatin  40 mg Oral Daily  . tamsulosin  0.4 mg Oral  Daily   Continuous Infusions: . cefTRIAXone (ROCEPHIN)  IV    . heparin 650 Units/hr (03/25/19 0135)     LOS: 0 days    Time spent: 40 minutes    Ramiro Harvest, MD [facility]  If 7PM-7AM, please contact night-coverage [email or web address] 03/25/2019, 10:17 AM

## 2019-03-25 NOTE — ED Notes (Signed)
Pt had liquid stool x2. Pt cleaned and new brief applied. Pt repositioned in bed.

## 2019-03-26 LAB — GLUCOSE, CAPILLARY
Glucose-Capillary: 185 mg/dL — ABNORMAL HIGH (ref 70–99)
Glucose-Capillary: 168 mg/dL — ABNORMAL HIGH (ref 70–99)
Glucose-Capillary: 168 mg/dL — ABNORMAL HIGH (ref 70–99)
Glucose-Capillary: 91 mg/dL (ref 70–99)

## 2019-03-26 LAB — COMPREHENSIVE METABOLIC PANEL
ALT: 26 U/L (ref 0–44)
AST: 23 U/L (ref 15–41)
Albumin: 2.3 g/dL — ABNORMAL LOW (ref 3.5–5.0)
Alkaline Phosphatase: 68 U/L (ref 38–126)
Anion gap: 16 — ABNORMAL HIGH (ref 5–15)
BUN: 25 mg/dL — ABNORMAL HIGH (ref 8–23)
CO2: 28 mmol/L (ref 22–32)
Calcium: 7.3 mg/dL — ABNORMAL LOW (ref 8.9–10.3)
Chloride: 97 mmol/L — ABNORMAL LOW (ref 98–111)
Creatinine, Ser: 1.37 mg/dL — ABNORMAL HIGH (ref 0.44–1.00)
GFR calc Af Amer: 42 mL/min — ABNORMAL LOW (ref >60)
GFR calc non Af Amer: 37 mL/min — ABNORMAL LOW (ref >60)
Glucose, Bld: 331 mg/dL — ABNORMAL HIGH (ref 70–99)
Potassium: 2.7 mmol/L — CL (ref 3.5–5.1)
Sodium: 141 mmol/L (ref 135–145)
Total Bilirubin: 0.8 mg/dL (ref 0.3–1.2)
Total Protein: 4.7 g/dL — ABNORMAL LOW (ref 6.5–8.1)

## 2019-03-26 LAB — CBC
HCT: 29.1 % — ABNORMAL LOW (ref 36.0–46.0)
Hemoglobin: 9.8 g/dL — ABNORMAL LOW (ref 12.0–15.0)
MCH: 33.3 pg (ref 26.0–34.0)
MCHC: 33.7 g/dL (ref 30.0–36.0)
MCV: 99 fL (ref 80.0–100.0)
Platelets: 160 10*3/uL (ref 150–400)
RBC: 2.94 MIL/uL — ABNORMAL LOW (ref 3.87–5.11)
RDW: 13.5 % (ref 11.5–15.5)
WBC: 7.6 10*3/uL (ref 4.0–10.5)
nRBC: 0 % (ref 0.0–0.2)

## 2019-03-26 LAB — HEMOGLOBIN A1C
Hgb A1c MFr Bld: 6.4 % — ABNORMAL HIGH (ref 4.8–5.6)
Mean Plasma Glucose: 136.98 mg/dL

## 2019-03-26 LAB — MAGNESIUM: Magnesium: 1.6 mg/dL — ABNORMAL LOW (ref 1.7–2.4)

## 2019-03-26 LAB — HEPARIN LEVEL (UNFRACTIONATED)
Heparin Unfractionated: 0.28 IU/mL — ABNORMAL LOW (ref 0.30–0.70)
Heparin Unfractionated: 0.35 IU/mL (ref 0.30–0.70)

## 2019-03-26 MED ORDER — SODIUM CHLORIDE 0.9 % IV BOLUS
500.0000 mL | Freq: Once | INTRAVENOUS | Status: AC
  Administered 2019-03-26: 500 mL via INTRAVENOUS

## 2019-03-26 MED ORDER — KCL-LACTATED RINGERS 20 MEQ/L IV SOLN
INTRAVENOUS | Status: DC
  Filled 2019-03-26 (×2): qty 1000

## 2019-03-26 MED ORDER — KCL-LACTATED RINGERS 20 MEQ/L IV SOLN: INTRAVENOUS | Status: DC

## 2019-03-26 MED ORDER — POTASSIUM CHLORIDE 10 MEQ/100ML IV SOLN
10.0000 meq | INTRAVENOUS | Status: AC
  Administered 2019-03-26 (×3): 10 meq via INTRAVENOUS
  Filled 2019-03-26 (×6): qty 100

## 2019-03-26 MED ORDER — POTASSIUM CHLORIDE CRYS ER 20 MEQ PO TBCR: 40.0000 meq | EXTENDED_RELEASE_TABLET | Freq: Once | ORAL | Status: DC

## 2019-03-26 MED ORDER — MAGNESIUM SULFATE 2 GM/50ML IV SOLN
2.0000 g | Freq: Once | INTRAVENOUS | Status: AC
  Administered 2019-03-26: 2 g via INTRAVENOUS
  Filled 2019-03-26: qty 50

## 2019-03-26 MED ORDER — LACTATED RINGERS IV SOLN: INTRAVENOUS | Status: DC

## 2019-03-26 MED ORDER — POTASSIUM CHLORIDE 2 MEQ/ML IV SOLN
INTRAVENOUS | Status: DC
  Administered 2019-03-26 – 2019-03-27 (×3): via INTRAVENOUS
  Filled 2019-03-26 (×6): qty 1000

## 2019-03-26 NOTE — Consult Note (Signed)
[facility] Nurse Consult Note: Reason for Consult: LLE wound Wound type: full thickness ulcer; unknown etiology; present x 5 years; husband at bedside reports not followed by MD of any type; unclear if every bx. No swelling; strong distal pulses.  Concerned that patient would need dermatologist to determine etiology. Patient has been self treating at home with multiple OTC creams.  Pressure Injury POA: NA Measurement: 6cm x 5cm x 0.1cm Wound bed:100% clean, some hypergranulation tissue throughout  Drainage (amount, consistency, odor) minimal, serosanguinous, not purulent, no odor  Periwound:intact, palpable distal pulses; no edema  Dressing procedure/placement/frequency: Single layer of xeroform, dry dressing, conform and ACE wrap to secure with no tension.  Patient has sensitivity to tape.   Wound strong recommend dermatology consultation for differential dx of full thickness leg ulcer present x 5 years.   Discussed POC with patient and bedside nurse.  Re consult if needed, will not follow at this time. Thanks  Maverick Dieudonne [employer], RN,CWOCN, CNS, [place] [phone])

## 2019-03-26 NOTE — ED Notes (Signed)
Patient ordered to receive 6 run of K+. Patient got through 2 bags and started c/o pain at her IV infusion site and refused to have any more. Messaged Griffith DO pts BP was also 81/37 then 91/38, she told me to hold the Metoprolol and give a 500 ml bolus with the K+ so it does not hurt as bad. I started the bolus and K+, she received only 1.5 bag and she said she could not take any more. Griffith DO ordered K+ po and LR 20 mEq.

## 2019-03-26 NOTE — ED Notes (Signed)
Tried to have conversation with patient and patients response was, "Whatever"

## 2019-03-26 NOTE — ED Notes (Signed)
Pt has wound on lower left leg, previously dressed and wrapped, gauze saturated with purulent drainage, dressing changed, wound cleaned and redressed at this time.

## 2019-03-26 NOTE — Progress Notes (Signed)
Urology Inpatient Progress Note  Subjective: Tiffany Manning is a 78 y.o. female admitted on 03/24/2019 with a 2-week history of weakness and anorexia with elevated creatinine and A. fib with RVR.  UA concerning for infection, on antibiotics as below.  Patient with chronic hydronephrosis at baseline.  Urine culture resulted with multiple species, recollection suggested.  Blood cultures pending with no growth at <24 hours.  Creatinine down today, 1.37.  WBC count down today, 7.6.   Bedside bladder scan with 115 mL.  Today, she reports pain at her left IV site associated with potassium infusion.  She also reports dysuria.  Anti-infectives: Anti-infectives (From admission, onward)   Start     Dose/Rate Route Frequency Ordered Stop   03/25/19 2100  cefTRIAXone (ROCEPHIN) 2 g in sodium chloride 0.9 % 100 mL IVPB     2 g 200 mL/hr over 30 Minutes Intravenous Every 24 hours 03/24/19 2157     03/24/19 2015  cefTRIAXone (ROCEPHIN) 1 g in sodium chloride 0.9 % 100 mL IVPB     1 g 200 mL/hr over 30 Minutes Intravenous  Once 03/24/19 2000 03/24/19 2142      Current Facility-Administered Medications  Medication Dose Route Frequency Provider Last Rate Last Dose  . acetaminophen (TYLENOL) tablet 650 mg  650 mg Oral Q6H PRN Rise Patience, MD   650 mg at 03/25/19 1657   Or  . acetaminophen (TYLENOL) suppository 650 mg  650 mg Rectal Q6H PRN Rise Patience, MD      . cefTRIAXone (ROCEPHIN) 2 g in sodium chloride 0.9 % 100 mL IVPB  2 g Intravenous Q24H Rise Patience, MD   Stopped at 03/26/19 0117  . heparin ADULT infusion 100 units/mL (25000 units/286mL sodium chloride 0.45%)  750 Units/hr Intravenous Continuous Nicole Kindred A, DO 7.5 mL/hr at 03/26/19 0751 750 Units/hr at 03/26/19 0751  . insulin aspart (novoLOG) injection 0-9 Units  0-9 Units Subcutaneous TID WC Rise Patience, MD   2 Units at 03/26/19 0911  . loperamide (IMODIUM) capsule 2 mg  2 mg Oral PRN Eugenie Filler, MD   2 mg at 03/25/19 1958  . metoprolol tartrate (LOPRESSOR) tablet 75 mg  75 mg Oral BID Rise Patience, MD   75 mg at 03/25/19 2258  . ondansetron (ZOFRAN) tablet 4 mg  4 mg Oral Q6H PRN Rise Patience, MD       Or  . ondansetron [facility]) injection 4 mg  4 mg Intravenous Q6H PRN Rise Patience, MD      . potassium chloride 10 mEq in 100 mL IVPB  10 mEq Intravenous Q1 Hr x 6 Lang Snow, NP 100 mL/hr at 03/26/19 0900 10 mEq at 03/26/19 0900  . simvastatin (ZOCOR) tablet 40 mg  40 mg Oral Daily Rise Patience, MD   40 mg at 03/25/19 1958  . sodium bicarbonate 150 mEq in dextrose 5% 1000 mL infusion  150 mEq Intravenous Continuous Hallaji, Dani Gobble, RPH 100 mL/hr at 03/25/19 1310 150 mEq at 03/25/19 1310  . tamsulosin (FLOMAX) capsule 0.4 mg  0.4 mg Oral Daily Rise Patience, MD   0.4 mg at 03/25/19 2259   Current Outpatient Medications  Medication Sig Dispense Refill  . acetaminophen (TYLENOL) 325 MG tablet Take 2 tablets (650 mg total) by mouth every 6 (six) hours as needed for mild pain (or Fever >/= 101).    Marland Kitchen glipiZIDE (GLUCOTROL) 5 MG tablet Take 0.5 tablets (2.5 mg  total) by mouth daily before breakfast. 60 tablet 11  . Metoprolol Tartrate 75 MG TABS Take 75 mg by mouth 2 (two) times daily. 60 tablet 0  . ranitidine (ZANTAC) 150 MG capsule Take 150 mg by mouth 2 (two) times daily as needed for heartburn.    . simvastatin (ZOCOR) 40 MG tablet Take 40 mg by mouth daily.     . tamsulosin (FLOMAX) 0.4 MG CAPS capsule Take 1 capsule (0.4 mg total) by mouth daily. 30 capsule 11  . triamterene-hydrochlorothiazide (DYAZIDE) 37.5-25 MG capsule Take 1 capsule by mouth 2 (two) times daily.     Marland Kitchen trimethoprim (TRIMPEX) 100 MG tablet Take 1 tablet (100 mg total) by mouth daily. 30 tablet 11   Objective: Vital signs in last 24 hours: Pulse Rate:  [71-154] 91 (11/18 0639) Resp:  [14-23] 16 (11/18 0639) BP: (98-140)/(50-94) 98/50 (11/18 0639) SpO2:   [94 %-100 %] 96 % (11/18 0639)  Intake/Output from previous day: 11/17 0701 - 11/18 0700 In: 100 [IV Piggyback:100] Out: -  Intake/Output this shift: No intake/output data recorded.  Physical Exam Vitals signs and nursing note reviewed.  Constitutional:      General: She is not in acute distress.    Appearance: She is ill-appearing. She is not toxic-appearing or diaphoretic.  HENT:     Head: Normocephalic and atraumatic.  Pulmonary:     Effort: Pulmonary effort is normal. No respiratory distress.  Abdominal:     General: There is no distension.     Palpations: Abdomen is soft.     Tenderness: There is no abdominal tenderness. There is no guarding or rebound.  Skin:    General: Skin is warm and dry.  Neurological:     Mental Status: She is alert.  Psychiatric:        Mood and Affect: Mood normal.        Behavior: Behavior normal.    Lab Results:  Recent Labs    03/25/19 1132 03/26/19 0543  WBC 10.5 7.6  HGB 11.8* 9.8*  HCT 36.4 29.1*  PLT 210 160   BMET Recent Labs    03/25/19 1132 03/26/19 0543  NA 141 141  K 2.9* 2.7*  CL 112* 97*  CO2 16* 28  GLUCOSE 159* 331*  BUN 35* 25*  CREATININE 1.73* 1.37*  CALCIUM 8.3* 7.3*   PT/INR Recent Labs    03/24/19 2332  LABPROT 13.7  INR 1.1   Assessment & Plan: 78 year old female admitted with weakness and anorexia and subsequently found to have A. fib with RVR, elevated creatinine, and possible UTI.  Labs improving on IV antibiotics.  Recommend repeat urine culture today with plans to narrow therapy if possible pending results.  No new recommendations today.  Carman Ching, PA-C 03/26/2019

## 2019-03-26 NOTE — Progress Notes (Signed)
PROGRESS NOTE    Tiffany Manning  VZD:[id number] DOB: [birth year] DOA: 03/24/2019  PCP: Albina Billet, MD    LOS - 1   Brief Narrative:  78 y.o.femalewithhistory of hypertension, hyperlipidemia, recurrent urinary tract infection on chronic suppressive antibiotic, diabetes mellitus, A. fib was brought to the ER after patient was found to be increasingly weak over the last 2 weeks with poor appetite and intermittent nausea vomiting.  In the ED, patient in a-fib with RVR which was controlled with single dose IV Cardizem.  Labs notable for creatinine 2.33, up from 1.3 in October.  UA consistent with UTI.  CT renal study was done which shows features concerning for left-sided hydroureteronephrosis. No obstructing stone seen. Patient started on ceftriaxone IV fluids admitted for A. fib with RVR with acute renal failure and UTI.  Subjective 11/18: Patient seen, awake in bed with husband at bedside.  Patient moaning in pain due to potassium infusion, insists it be stopped.  She is unwilling to answer questions regarding other complaints due to pain at IV site.  No acute events reported overnight.    Assessment & Plan:   Active Problems:   Acute renal failure (HCC)   Atrial fibrillation with rapid ventricular response (HCC)   ARF (acute renal failure) (HCC)   Lower urinary tract infectious disease   DM (diabetes mellitus), type 2 with renal complications (HCC)   A. fib with RVR - now rate controlled Likely secondary to active infection.  Known history of A. fib.  Not on anticoagulation long-term.  CHA2DS2-VASc of at least 4.   - Continue home metoprolol 75 mg BID  - continue heparin drip for now   Recurrent UTI with acute lower urinary tract infection On chronic suppressive antibiotics.  Follows with urology. - f/u urine culture - continue Rocephin for now.  Acute renal failure - improving with IV fluids Metabolic acidosis secondary to renal failure Likely multifactorial secondary  to prerenal azotemia from poor oral intake, nausea vomiting dehydration in the setting of chronic left-sided hydroureteronephrosis and mild right hydronephrosis.  Initially on bicarb drip, stopped. - continue IV hydration - monitor BMP   Hypokalemia - repleting Secondary to vomiting and diarrhea.  Monitor BMP. - replete as needed for K>4.0  Chronic left-sided hydrouteronephrosis Being followed by urology in the outpatient setting.  Per urology note plan was for stent or other procedure.  CT with no acute obstruction noted however patient acute renal failure.  Consult with urology for further evaluation and management.  Diabetes mellitus type 2 Hemoglobin A1c was 7.8 on 01/30/2018.  Repeat hemoglobin A1c.  CBG of 85 this morning.  Place on IV fluids.  Sliding scale insulin.  Hypertension Triamterene HCTZ on hold secondary to dehydration and acute renal failure.  Continue metoprolol.  Hyperlipidemia Continue statin.  Chronic left lower extremity wound Wound care consulted.   DVT prophylaxis: heparin   Code Status: DNR  Family Communication: husband at bedside  Disposition Plan:  Pending clinical improvement and PT evaluation   Consultants:   Urology  Procedures:   None  Antimicrobials:   Rocephin, 11/6 to 11/10 (expected)   Objective: Vitals:   03/26/19 0118 03/26/19 0603 03/26/19 0638 03/26/19 0639  BP: 116/72   (!) 98/50  Pulse: 74 84 90 91  Resp: 16 16  16   Temp:      TempSrc:      SpO2:  94%  96%  Weight:      Height:  Intake/Output Summary (Last 24 hours) at 03/26/2019 0835 Last data filed at 03/26/2019 0117 Gross per 24 hour  Intake 100 ml  Output -  Net 100 ml   Filed Weights   03/24/19 1744  Weight: 56.7 kg    Examination:  General exam: awake, alert, no acute distress, obese, chronically ill appearing Respiratory system: clear to auscultation bilaterally, no wheezes, rales or rhonchi, normal respiratory effort. Cardiovascular  system: normal S1/S2, RRR, no JVD, murmurs, rubs, gallops Gastrointestinal system: soft, non-tender, non-distended abdomen, normal bowel sounds. Central nervous system: alert and oriented x4. no gross focal neurologic deficits, normal speech Extremities: moves all, no edema, normal tone Psychiatry: anxious mood, congruent affect   Data Reviewed: I have personally reviewed following labs and imaging studies  CBC: Recent Labs  Lab 03/24/19 1747 03/25/19 0020 03/25/19 1132 03/26/19 0543  WBC 11.2* 11.3* 10.5 7.6  NEUTROABS 8.9* 7.8* 7.7  --   HGB 13.7 12.3 11.8* 9.8*  HCT 43.0 38.6 36.4 29.1*  MCV 104.9* 106.6* 105.2* 99.0  PLT 244 233 210 160   Basic Metabolic Panel: Recent Labs  Lab 03/24/19 1747 03/25/19 0020 03/25/19 1132 03/26/19 0543  NA 143 141 141 141  K 3.3* 2.8* 2.9* 2.7*  CL 109 112* 112* 97*  CO2 12* 12* 16* 28  GLUCOSE 144* 96 159* 331*  BUN 43* 40* 35* 25*  CREATININE 2.33* 1.93* 1.73* 1.37*  CALCIUM 9.3 8.3* 8.3* 7.3*  MG  --   --  1.9 1.6*   GFR: Estimated Creatinine Clearance: 26.3 mL/min (A) (by C-G formula based on SCr of 1.37 mg/dL (H)). Liver Function Tests: Recent Labs  Lab 03/25/19 0020 03/25/19 1132 03/26/19 0543  AST ALT 51* 45* 26  ALKPHOS 103 92 68  BILITOT 0.9 1.0 0.8  PROT 6.3* 5.8* 4.7*  ALBUMIN 3.2* 3.0* 2.3*   No results for input(s): LIPASE, AMYLASE in the last 168 hours. No results for input(s): AMMONIA in the last 168 hours. Coagulation Profile: Recent Labs  Lab 03/24/19 2332  INR 1.1   Cardiac Enzymes: No results for input(s): CKTOTAL, CKMB, CKMBINDEX, TROPONINI in the last 168 hours. BNP (last 3 results) No results for input(s): PROBNP in the last 8760 hours. HbA1C: No results for input(s): HGBA1C in the last 72 hours. CBG: Recent Labs  Lab 03/25/19 0223 03/25/19 0751 03/25/19 1204 03/25/19 1656 03/26/19 0822  GLUCAP 89 65* 147* 137* 185*   Lipid Profile: No results for input(s): CHOL, HDL,  LDLCALC, TRIG, CHOLHDL, LDLDIRECT in the last 72 hours. Thyroid Function Tests: Recent Labs    03/24/19 2332  TSH 1.964   Anemia Panel: No results for input(s): VITAMINB12, FOLATE, FERRITIN, TIBC, IRON, RETICCTPCT in the last 72 hours. Sepsis Labs: Recent Labs  Lab 03/24/19 2017  LATICACIDVEN 1.7    Recent Results (from the past 240 hour(s))  Urine culture     Status: Abnormal   Collection Time: 03/24/19  5:47 PM   Specimen: Urine, Random  Result Value Ref Range Status   Specimen Description   Final    URINE, RANDOM Performed at [facility], [address] [place], [place] [address]    Special Requests   Final    NONE Performed at [facility], [address] [place], [place] [address]    Culture MULTIPLE SPECIES PRESENT, SUGGEST RECOLLECTION (A)  Final   Report Status 03/25/2019 FINAL  Final  SARS CORONAVIRUS 2 (TAT 6-24 HRS) Nasopharyngeal Nasopharyngeal Swab  Status: None   Collection Time: 03/24/19  8:57 PM   Specimen: Nasopharyngeal Swab  Result Value Ref Range Status   SARS Coronavirus 2 NEGATIVE NEGATIVE Final    Comment: (NOTE) SARS-CoV-2 target nucleic acids are NOT DETECTED. The SARS-CoV-2 RNA is generally detectable in upper and lower respiratory specimens during the acute phase of infection. Negative results do not preclude SARS-CoV-2 infection, do not rule out co-infections with other pathogens, and should not be used as the sole basis for treatment or other patient management decisions. Negative results must be combined with clinical observations, patient history, and epidemiological information. The expected result is Negative. Fact Sheet for Patients: HairSlick.nohttps://www.fda.gov/media/138098/download Fact Sheet for Healthcare Providers: quierodirigir.comhttps://www.fda.gov/media/138095/download This test is not yet approved or cleared by the [place] FDA and  has been authorized for detection and/or diagnosis of SARS-CoV-2 by FDA under an  Emergency Use Authorization (EUA). This EUA will remain  in effect (meaning this test can be used) for the duration of the COVID-19 declaration under Section 56 4(b)(1) of the Act, 21 U.S.C. section 360bbb-3(b)(1), unless the authorization is terminated or revoked sooner. Performed at [facility], [address] [place], [place] [address]   Culture, blood (routine x 2)     Status: None (Preliminary result)   Collection Time: 03/25/19 11:32 AM   Specimen: BLOOD  Result Value Ref Range Status   Specimen Description BLOOD BLOOD LEFT FOREARM  Final   Special Requests   Final    BOTTLES DRAWN AEROBIC AND ANAEROBIC Blood Culture adequate volume   Culture   Final    NO GROWTH < 24 HOURS Performed at [facility], [address] [place], [place] [address]    Report Status PENDING  Incomplete  Culture, blood (routine x 2)     Status: None (Preliminary result)   Collection Time: 03/25/19 11:32 AM   Specimen: BLOOD  Result Value Ref Range Status   Specimen Description BLOOD RIGHT ANTECUBITAL  Final   Special Requests   Final    BOTTLES DRAWN AEROBIC AND ANAEROBIC Blood Culture adequate volume   Culture   Final    NO GROWTH < 24 HOURS Performed at [facility], [address] [place], [place] [address]    Report Status PENDING  Incomplete         Radiology Studies: Ct Renal Stone Study  Result Date: 03/24/2019 CLINICAL DATA:  Flank pain EXAM: CT ABDOMEN AND PELVIS WITHOUT CONTRAST TECHNIQUE: Multidetector CT imaging of the abdomen and pelvis was performed following the standard protocol without IV contrast. COMPARISON:  Ultrasound 12/24/2018, CT 01/28/2018, 09/19/2011 FINDINGS: Lower chest: Stable nodularity in the right middle lobe. 5 mm right middle lobe pulmonary nodule, decreased compared to prior measurement of 7 mm. No acute consolidation or effusion. Coronary vascular calcification. Heart size within normal limits. Small hiatal hernia.  Hepatobiliary: No focal hepatic abnormality. Status post cholecystectomy. No significant biliary dilatation Pancreas: No acute inflammatory changes. Pancreas is atrophic and contains a few parenchymal calcifications. Fifteen mm hypodense nodule adjacent to or exophytic to the pancreas body, stable to slight increase in size. Spleen: Normal in size without focal abnormality. Adrenals/Urinary Tract: Adrenal glands are normal. Lobulated atrophic left kidney. Moderate left and mild right hydronephrosis, decreased compared to prior study. Left hydroureter with stranding about the distal left ureter. No stones. Mild increased density within the posterior bladder. Small cyst lower pole left kidney. Stomach/Bowel: The stomach is nonenlarged. No dilated small bowel. Collapsed appearance of transverse colon. Possible  wall thickening of the ascending colon. Vascular/Lymphatic: Moderate aortic atherosclerosis without aneurysm. No significantly enlarged lymph nodes. Reproductive: Status post hysterectomy. No adnexal masses. Other: Negative for free air or free fluid. Moderate fat containing infraumbilical hernia. Musculoskeletal: No acute or suspicious osseous abnormality. Similar scattered foci of sclerosis within the hips and pelvis. IMPRESSION: 1. Moderate left and mild right hydronephrosis with left hydroureter, though decreased compared to the prior CT. No ureteral stone is seen. Left ureter is dilated down to the level of the bladder. Cortical scarring and atrophy of the left kidney. 2. Mild increased density at the posterior bladder, possible small stones. 3. 15 mm hypodense nodule possibly arising from the pancreatic body. Nonemergent MRI recommended for further evaluation given increasing size as compared with prior exams. 4. 5 mm right middle lobe pulmonary nodule, decreased in size 5. Possible wall thickening of the ascending colon though without significant surrounding inflammatory change. Correlation with  colonoscopy suggested to exclude mass in the region. 6. Moderate fat containing infraumbilical ventral hernia Electronically Signed   By: Jasmine Pang M.D.   On: 03/24/2019 21:31        Scheduled Meds: . insulin aspart  0-9 Units Subcutaneous TID WC  . metoprolol tartrate  75 mg Oral BID  . simvastatin  40 mg Oral Daily  . tamsulosin  0.4 mg Oral Daily   Continuous Infusions: . cefTRIAXone (ROCEPHIN)  IV Stopped (03/26/19 0117)  . heparin 750 Units/hr (03/26/19 0751)  . magnesium sulfate bolus IVPB 50 mL/hr at 03/26/19 0748  . potassium chloride 100 mL/hr at 03/26/19 0749  . sodium bicarbonate 150 mEq in dextrose 5% 1000 mL 150 mEq (03/25/19 1310)     LOS: 1 day    Time spent: 35-40 minutes    Pennie Banter, DO [facility] Pager: [phone]  If 7PM-7AM, please contact night-coverage [email or web address] Password [facility] 03/26/2019, 8:35 AM

## 2019-03-26 NOTE — ED Notes (Signed)
Pt turned on left side lying position.

## 2019-03-26 NOTE — ED Notes (Signed)
Pt brief changed at this time with [facility] NT assistance.

## 2019-03-26 NOTE — Progress Notes (Signed)
[place] for heparin Indication: atrial fibrillation  Allergies  Allergen Reactions  . Sulfa Antibiotics Nausea And Vomiting    Patient Measurements: Height: 5' (152.4 cm) Weight: 125 lb (56.7 kg) IBW/kg (Calculated) : 45.5 Heparin Dosing Weight:   Vital Signs: BP: 98/50 (11/18 0639) Pulse Rate: 91 (11/18 0639)  Labs: Recent Labs    03/24/19 1747 03/24/19 2331 03/24/19 2332 03/25/19 0020 03/25/19 0559 03/25/19 1132 03/25/19 1529 03/26/19 0543  HGB 13.7  --   --  12.3  --  11.8*  --  9.8*  HCT 43.0  --   --  38.6  --  36.4  --  29.1*  PLT 244  --   --  233  --  210  --  160  APTT  --   --  32  --   --   --   --   --   LABPROT  --   --  13.7  --   --   --   --   --   INR  --   --  1.1  --   --   --   --   --   HEPARINUNFRC  --   --   --   --  0.59  --  0.48 0.28*  CREATININE 2.33*  --   --  1.93*  --  1.73*  --  1.37*  TROPONINIHS 16 19*  --   --   --   --   --   --     Estimated Creatinine Clearance: 26.3 mL/min (A) (by C-G formula based on SCr of 1.37 mg/dL (H)).   Medical History: Past Medical History:  Diagnosis Date  . A-fib ([place])   . Arthritis   . Diabetes mellitus without complication ([place])   . History of kidney stones   . Hyperlipidemia   . Hypertension   . PONV (postoperative nausea and vomiting)   . Sepsis ([place])     Medications:  Scheduled:  . insulin aspart  0-9 Units Subcutaneous TID WC  . metoprolol tartrate  75 mg Oral BID  . simvastatin  40 mg Oral Daily  . tamsulosin  0.4 mg Oral Daily    Assessment: Patient admitted for weakness found to have hydronephrosis s/t to pyelonephritis and is in afib w/ RVR apparently new onset no anticoagulation PTA. Patient is being started on heparin drip for anticoagulation for afib.  11/16: Will bolus w/ heparin 3000 units IV x 1 Will start rate at 650 units/hr. 11/17 0559 HL=0.59. Will continue current rate 11/17 1529 HL=0.48. Level was scheduled for 1400  today and was drawn 1.5 hours late. HL is therapeutic x2.   Goal of Therapy:  Heparin level 0.3-0.7 units/ml Monitor platelets by anticoagulation protocol: Yes   Plan:  11/18 @ 0600 HL 0.28 subtherapeutic. Will increase rate to 750 units/hr and will recheck HL @ 1500. Per RN patient not having any bleeding complications, CBC is trending down will continue to monitor.  Tobie Lords, PharmD, BCPS Clinical Pharmacist 03/26/2019

## 2019-03-26 NOTE — Progress Notes (Signed)
[place] for heparin Indication: atrial fibrillation  Allergies  Allergen Reactions  . Sulfa Antibiotics Nausea And Vomiting    Patient Measurements: Height: 5' (152.4 cm) Weight: 125 lb (56.7 kg) IBW/kg (Calculated) : 45.5 Heparin Dosing Weight:   Vital Signs: Temp: 97.6 F (36.4 C) (11/18 1453) Temp Source: Oral (11/18 1453) BP: 92/50 (11/18 1453) Pulse Rate: 85 (11/18 1453)  Labs: Recent Labs    03/24/19 1747 03/24/19 2331 03/24/19 2332 03/25/19 0020  03/25/19 1132 03/25/19 1529 03/26/19 0543 03/26/19 1518  HGB 13.7  --   --  12.3  --  11.8*  --  9.8*  --   HCT 43.0  --   --  38.6  --  36.4  --  29.1*  --   PLT 244  --   --  233  --  210  --  160  --   APTT  --   --  32  --   --   --   --   --   --   LABPROT  --   --  13.7  --   --   --   --   --   --   INR  --   --  1.1  --   --   --   --   --   --   HEPARINUNFRC  --   --   --   --    < >  --  0.48 0.28* 0.35  CREATININE 2.33*  --   --  1.93*  --  1.73*  --  1.37*  --   TROPONINIHS 16 19*  --   --   --   --   --   --   --    < > = values in this interval not displayed.    Estimated Creatinine Clearance: 26.3 mL/min (A) (by C-G formula based on SCr of 1.37 mg/dL (H)).   Medical History: Past Medical History:  Diagnosis Date  . A-fib ([place])   . Arthritis   . Diabetes mellitus without complication ([place])   . History of kidney stones   . Hyperlipidemia   . Hypertension   . PONV (postoperative nausea and vomiting)   . Sepsis ([place])     Medications:  Scheduled:  . insulin aspart  0-9 Units Subcutaneous TID WC  . metoprolol tartrate  75 mg Oral BID  . simvastatin  40 mg Oral Daily  . tamsulosin  0.4 mg Oral Daily    Assessment: Patient admitted for weakness found to have hydronephrosis s/t to pyelonephritis and is in afib w/ RVR apparently new onset no anticoagulation PTA. Patient is being started on heparin drip for anticoagulation for afib.  11/16: Will bolus  w/ heparin 3000 units IV x 1 Will start rate at 650 units/hr. 11/17 0559 HL=0.59. Will continue current rate 11/17 1529 HL=0.48. Level was scheduled for 1400 today and was drawn 1.5 hours late. HL is therapeutic x2.  11/18 @ 0600 HL 0.28 subtherapeutic.  11/18 @ 1518 HL 0.35 therapeutic x1 s/p rate increase this morning/ Confirmed no heparin interruptions.   Goal of Therapy:  Heparin level 0.3-0.7 units/ml Monitor platelets by anticoagulation protocol: Yes   Plan:  Will continue current heparin rate of 750 units/hr and will recheck HL in 8 hours. Will check CBC with AM labs.    Kristeen Miss, PharmD Clinical Pharmacist 03/26/2019

## 2019-03-26 NOTE — ED Notes (Signed)
Called gave report to [place] on [place].

## 2019-03-27 DIAGNOSIS — E440 Moderate protein-calorie malnutrition: Secondary | ICD-10-CM

## 2019-03-27 LAB — HEPARIN LEVEL (UNFRACTIONATED)
Heparin Unfractionated: 0.34 IU/mL (ref 0.30–0.70)
Heparin Unfractionated: 0.41 IU/mL (ref 0.30–0.70)

## 2019-03-27 LAB — BASIC METABOLIC PANEL
Anion gap: 11 (ref 5–15)
BUN: 14 mg/dL (ref 8–23)
CO2: 29 mmol/L (ref 22–32)
Calcium: 7.6 mg/dL — ABNORMAL LOW (ref 8.9–10.3)
Chloride: 103 mmol/L (ref 98–111)
Creatinine, Ser: 1.02 mg/dL — ABNORMAL HIGH (ref 0.44–1.00)
GFR calc Af Amer: >60 mL/min (ref >60)
GFR calc non Af Amer: 52 mL/min — ABNORMAL LOW (ref >60)
Glucose, Bld: 98 mg/dL (ref 70–99)
Potassium: 3.1 mmol/L — ABNORMAL LOW (ref 3.5–5.1)
Sodium: 143 mmol/L (ref 135–145)

## 2019-03-27 LAB — CBC
HCT: 30.4 % — ABNORMAL LOW (ref 36.0–46.0)
Hemoglobin: 9.9 g/dL — ABNORMAL LOW (ref 12.0–15.0)
MCH: 34.1 pg — ABNORMAL HIGH (ref 26.0–34.0)
MCHC: 32.6 g/dL (ref 30.0–36.0)
MCV: 104.8 fL — ABNORMAL HIGH (ref 80.0–100.0)
Platelets: 135 10*3/uL — ABNORMAL LOW (ref 150–400)
RBC: 2.9 MIL/uL — ABNORMAL LOW (ref 3.87–5.11)
RDW: 13.8 % (ref 11.5–15.5)
WBC: 6.7 10*3/uL (ref 4.0–10.5)
nRBC: 0 % (ref 0.0–0.2)

## 2019-03-27 LAB — GLUCOSE, CAPILLARY
Glucose-Capillary: 90 mg/dL (ref 70–99)
Glucose-Capillary: 136 mg/dL — ABNORMAL HIGH (ref 70–99)
Glucose-Capillary: 229 mg/dL — ABNORMAL HIGH (ref 70–99)
Glucose-Capillary: 103 mg/dL — ABNORMAL HIGH (ref 70–99)

## 2019-03-27 LAB — MAGNESIUM: Magnesium: 1.8 mg/dL (ref 1.7–2.4)

## 2019-03-27 MED ORDER — VITAMIN C 500 MG PO TABS
250.0000 mg | ORAL_TABLET | Freq: Two times a day (BID) | ORAL | Status: DC
  Administered 2019-03-27 – 2019-03-28 (×2): 250 mg via ORAL
  Filled 2019-03-27 (×2): qty 1

## 2019-03-27 MED ORDER — RISAQUAD PO CAPS: 2.0000 | ORAL_CAPSULE | Freq: Three times a day (TID) | ORAL | Status: DC

## 2019-03-27 MED ORDER — ADULT MULTIVITAMIN W/MINERALS CH
1.0000 | ORAL_TABLET | Freq: Every day | ORAL | Status: DC
  Administered 2019-03-28: 1 via ORAL
  Filled 2019-03-27: qty 1

## 2019-03-27 MED ORDER — POTASSIUM CHLORIDE CRYS ER 20 MEQ PO TBCR
40.0000 meq | EXTENDED_RELEASE_TABLET | ORAL | Status: AC
  Filled 2019-03-27: qty 2

## 2019-03-27 MED ORDER — PRO-STAT SUGAR FREE PO LIQD
30.0000 mL | Freq: Two times a day (BID) | ORAL | Status: DC
  Administered 2019-03-27 – 2019-03-28 (×3): 30 mL via ORAL

## 2019-03-27 MED ORDER — RISAQUAD PO CAPS
1.0000 | ORAL_CAPSULE | Freq: Every day | ORAL | Status: DC
  Administered 2019-03-27 – 2019-03-28 (×2): 1 via ORAL
  Filled 2019-03-27 (×2): qty 1

## 2019-03-27 MED ORDER — POTASSIUM CHLORIDE 20 MEQ PO PACK
40.0000 meq | PACK | ORAL | Status: AC
  Administered 2019-03-27 (×2): 40 meq via ORAL
  Filled 2019-03-27 (×2): qty 2

## 2019-03-27 NOTE — Progress Notes (Addendum)
PROGRESS NOTE    Tiffany Manning  VQQ:[id number] DOB: 09-13-1940 DOA: 03/24/2019  PCP: Jaclyn Shaggy, MD    LOS - 2   Brief Narrative:  78 y.o.femalewithhistory of hypertension, hyperlipidemia, recurrent urinary tract infection on chronic suppressive antibiotic, diabetes mellitus, A. fib was brought to the ER after patient was found to be increasingly weak over the last 2 weeks with poor appetite and intermittent nausea vomiting.  In the ED, patient in a-fib with RVR which was controlled with single dose IV Cardizem.  Labs notable for creatinine 2.33, up from 1.3 in October.  UA consistent with UTI.  CT renal study was done which shows features concerning for left-sided hydroureteronephrosis. No obstructing stone seen. Patient started on ceftriaxone IV fluids admitted for A. fib with RVR with acute renal failure and UTI.  Subjective 11/19: Patient awake sitting up in bed, husband at bedside.  No acute events reported overnight.  Patient denies fevers or chills.  Still with some dysuria and frequency.  Regarding her diarrhea, she states that no episodes today, last was at midnight.  Previously she would have some diarrhea with every void.  Yes sometimes ago she ago; Casimiro Needle has read through some website negotiating tactics back and talks very quietly so that [facility] and  Assessment & Plan:   Active Problems:   Acute renal failure (HCC)   Atrial fibrillation with rapid ventricular response (HCC)   ARF (acute renal failure) (HCC)   Lower urinary tract infectious disease   DM (diabetes mellitus), type 2 with renal complications (HCC)   A. fib with RVR - now rate controlled Likely secondary to active infection.  Known history of A. fib.  Not on anticoagulation long-term.  Converted and currently in sinus rhythm. CHA2DS2-VASc of at least 4.  - continue home metoprolol 75 mg BID  - continue heparin drip for now  - cardiology consulted - pending  Recurrent UTI's  Lower  urinary tract infection - ruled out On chronic suppressive antibiotics.  Follows with urology.  Initial urine culture grew multiple species, recollection recommended & ordered, but this is likely contamination or colonization.  Patient has been afebrile, without leukocytosis.     - repeat urine culture ordered, follow up - stop Rocephin and monitor for fever, leukocytosis or worsening symptoms - add probiotics  Acute renal failure - improved Metabolic acidosis secondary to renal failure Likely multifactorial secondary to prerenal azotemia from poor oral intake, nausea vomiting dehydration. Initially on bicarb drip, stopped. - decrease IV fluids to 50 cc/hr - monitor BMP   Hypokalemia - repleting Secondary to vomiting and diarrhea.  Monitor BMP. - replete as needed for K>4.0  Chronic left-sided hydrouteronephrosis Being followed by urology in the outpatient setting. Per urology note plan was for stent or other procedure. CT with no acute obstruction noted however patient acute renal failure.  - Urology following  Diabetes mellitus type 2 Hemoglobin A1c was 7.8 on 01/30/2018. Repeat hemoglobin A1c. CBG of 85 this morning. Place on IV fluids. Sliding scale insulin.  Moderate Protein Calorie Malnutrition Malnutrition Type: Nutrition Problem: Moderate Malnutrition Etiology: chronic illness(chronic UTI, Advanced age) Malnutrition Characteristics: Signs/Symptoms: moderate fat depletion, mild fat depletion, moderate muscle depletion, mild muscle depletion Nutrition Interventions: Interventions: Liberalize Diet, Prostat, MVI, Magic cup, Other (Comment)(Vitamin C) - dietician consulted  Hypertension Triamterene HCTZ on hold secondary to dehydration and acute renal failure. Continue metoprolol.  Hyperlipidemia Continue statin.  Chronic left lower extremity wound Wound care consulted.   DVT prophylaxis: heparin  Code Status: DNR  Family Communication: husband at bedside  Disposition Plan:  Pending clinical improvement, PT evaluation, expect 1-2 more days   Consultants:   Urology  Procedures:   None  Antimicrobials:   Rocephin, start 11/16, plan stop 11/10   Objective: Vitals:   03/26/19 1453 03/26/19 2110 03/27/19 0434 03/27/19 0530  BP: (!) 92/50 118/62  117/66  Pulse: 85 93  84  Resp: 19 16  16   Temp: 97.6 F (36.4 C) 98.3 F (36.8 C)  97.9 F (36.6 C)  TempSrc: Oral Oral  Oral  SpO2: 100% 99%  96%  Weight:   57.6 kg   Height:        Intake/Output Summary (Last 24 hours) at 03/27/2019 0717 Last data filed at 03/27/2019 0100 Gross per 24 hour  Intake 3262.3 ml  Output -  Net 3262.3 ml   Filed Weights   03/24/19 1744 03/27/19 0434  Weight: 56.7 kg 57.6 kg    Examination:  General exam: awake, alert, no acute distress Respiratory system: decreased breath sounds but clear without wheezing, rales or rhonchi, normal respiratory effort. Cardiovascular system: normal S1/S2, RRR, no JVD, murmurs, rubs, gallops, no pedal edema.   Gastrointestinal system: soft, mild suprapubic tenderness on palpation, normal bowel sounds. Central nervous system: alert and oriented x4. no gross focal neurologic deficits, normal speech Extremities: moves all, no edema, normal tone Psychiatry: normal mood, congruent affect, judgement and insight appear normal    Data Reviewed: I have personally reviewed following labs and imaging studies  CBC: Recent Labs  Lab 03/24/19 1747 03/25/19 0020 03/25/19 1132 03/26/19 0543 03/27/19 0612  WBC 11.2* 11.3* 10.5 7.6 6.7  NEUTROABS 8.9* 7.8* 7.7  --   --   HGB 13.7 12.3 11.8* 9.8* 9.9*  HCT 43.0 38.6 36.4 29.1* 30.4*  MCV 104.9* 106.6* 105.2* 99.0 104.8*  PLT 244 233 210 160 135*   Basic Metabolic Panel: Recent Labs  Lab 03/24/19 1747 03/25/19 0020 03/25/19 1132 03/26/19 0543 03/27/19 0612  NA 143 141 141 141 143  K 3.3* 2.8* 2.9* 2.7* 3.1*  CL 109 112* 112* 97* 103  CO2 12* 12* 16* 28 29   GLUCOSE 144* 96 159* 331* 98  BUN 43* 40* 35* 25* 14  CREATININE 2.33* 1.93* 1.73* 1.37* 1.02*  CALCIUM 9.3 8.3* 8.3* 7.3* 7.6*  MG  --   --  1.9 1.6* 1.8   GFR: Estimated Creatinine Clearance: 35.5 mL/min (A) (by C-G formula based on SCr of 1.02 mg/dL (H)). Liver Function Tests: Recent Labs  Lab 03/25/19 0020 03/25/19 1132 03/26/19 0543  AST 25 26 23   ALT 51* 45* 26  ALKPHOS 103 92 68  BILITOT 0.9 1.0 0.8  PROT 6.3* 5.8* 4.7*  ALBUMIN 3.2* 3.0* 2.3*   No results for input(s): LIPASE, AMYLASE in the last 168 hours. No results for input(s): AMMONIA in the last 168 hours. Coagulation Profile: Recent Labs  Lab 03/24/19 2332  INR 1.1   Cardiac Enzymes: No results for input(s): CKTOTAL, CKMB, CKMBINDEX, TROPONINI in the last 168 hours. BNP (last 3 results) No results for input(s): PROBNP in the last 8760 hours. HbA1C: Recent Labs    03/26/19 0543  HGBA1C 6.4*   CBG: Recent Labs  Lab 03/25/19 1656 03/26/19 0822 03/26/19 1126 03/26/19 1704 03/26/19 2107  GLUCAP 137* 185* 168* 168* 91   Lipid Profile: No results for input(s): CHOL, HDL, LDLCALC, TRIG, CHOLHDL, LDLDIRECT in the last 72 hours. Thyroid Function Tests: Recent Labs  03/24/19 2332  TSH 1.964   Anemia Panel: No results for input(s): VITAMINB12, FOLATE, FERRITIN, TIBC, IRON, RETICCTPCT in the last 72 hours. Sepsis Labs: Recent Labs  Lab 03/24/19 2017  LATICACIDVEN 1.7    Recent Results (from the past 240 hour(s))  Urine culture     Status: Abnormal   Collection Time: 03/24/19  5:47 PM   Specimen: Urine, Random  Result Value Ref Range Status   Specimen Description   Final    URINE, RANDOM Performed at [facility], [address], [place], [place] [address]    Special Requests   Final    NONE Performed at [facility], [address] [place], [place] [address]    Culture MULTIPLE SPECIES PRESENT, SUGGEST RECOLLECTION (A)  Final   Report Status 03/25/2019 FINAL   Final  SARS CORONAVIRUS 2 (TAT 6-24 HRS) Nasopharyngeal Nasopharyngeal Swab     Status: None   Collection Time: 03/24/19  8:57 PM   Specimen: Nasopharyngeal Swab  Result Value Ref Range Status   SARS Coronavirus 2 NEGATIVE NEGATIVE Final    Comment: (NOTE) SARS-CoV-2 target nucleic acids are NOT DETECTED. The SARS-CoV-2 RNA is generally detectable in upper and lower respiratory specimens during the acute phase of infection. Negative results do not preclude SARS-CoV-2 infection, do not rule out co-infections with other pathogens, and should not be used as the sole basis for treatment or other patient management decisions. Negative results must be combined with clinical observations, patient history, and epidemiological information. The expected result is Negative. Fact Sheet for Patients: HairSlick.no Fact Sheet for Healthcare Providers: quierodirigir.com This test is not yet approved or cleared by the [place] FDA and  has been authorized for detection and/or diagnosis of SARS-CoV-2 by FDA under an Emergency Use Authorization (EUA). This EUA will remain  in effect (meaning this test can be used) for the duration of the COVID-19 declaration under Section 56 4(b)(1) of the Act, 21 U.S.C. section 360bbb-3(b)(1), unless the authorization is terminated or revoked sooner. Performed at [facility], [address], [place], [place] [address]   Culture, blood (routine x 2)     Status: None (Preliminary result)   Collection Time: 03/25/19 11:32 AM   Specimen: BLOOD  Result Value Ref Range Status   Specimen Description BLOOD BLOOD LEFT FOREARM  Final   Special Requests   Final    BOTTLES DRAWN AEROBIC AND ANAEROBIC Blood Culture adequate volume   Culture   Final    NO GROWTH < 24 HOURS Performed at [facility], [address], [place], [place] [address]    Report Status PENDING  Incomplete  Culture, blood  (routine x 2)     Status: None (Preliminary result)   Collection Time: 03/25/19 11:32 AM   Specimen: BLOOD  Result Value Ref Range Status   Specimen Description BLOOD RIGHT ANTECUBITAL  Final   Special Requests   Final    BOTTLES DRAWN AEROBIC AND ANAEROBIC Blood Culture adequate volume   Culture   Final    NO GROWTH < 24 HOURS Performed at [facility], [address], [place], [place] [address]    Report Status PENDING  Incomplete         Radiology Studies: No results found.      Scheduled Meds: . insulin aspart  0-9 Units Subcutaneous TID WC  . metoprolol tartrate  75 mg Oral BID  . simvastatin  40 mg Oral Daily  . tamsulosin  0.4 mg  Oral Daily   Continuous Infusions: . cefTRIAXone (ROCEPHIN)  IV 2 g (03/26/19 2118)  . heparin 750 Units/hr (03/26/19 1500)  . lactated ringers 1,000 mL with potassium chloride 20 mEq infusion 100 mL/hr at 03/27/19 0346     LOS: 2 days    Time spent: 25-30 minutes    Ezekiel Slocumb, DO [facility] Pager: [phone]  If 7PM-7AM, please contact night-coverage [email or web address] Password [email or web address] 03/27/2019, 7:17 AM

## 2019-03-27 NOTE — Progress Notes (Addendum)
Initial Nutrition Assessment  DOCUMENTATION CODES:   Non-severe (moderate) malnutrition in context of chronic illness  INTERVENTION:  Provide ProStat BID each supplement provides 100 calories and 15 grams of protein Provide Magic cup with meals (each supplement provides 250 calories and 9 grams of protein) Upgrade diet to Dysphagia 3 per MD MVI daily Vitamin C 250mg  BID Patient may benefit from probiotics  NUTRITION DIAGNOSIS:   Moderate Malnutrition related to chronic illness(chronic UTI, Advanced age) as evidenced by moderate fat depletion, mild fat depletion, moderate muscle depletion, mild muscle depletion.  GOAL:   Patient will meet greater than or equal to 90% of their needs  MONITOR:   PO intake, Supplement acceptance, Diet advancement, Labs, Weight trends, Skin, I & O's  REASON FOR ASSESSMENT:   Malnutrition Screening Tool   ASSESSMENT:   78 y.o. female with history of HTN, HLD, recurrent urinary tract infection on chronic suppressive antibiotic, DM, A. fib. Admitted for A. Fib, Generalized weakness and hydronephrosis.  Pt. States that she hasn't been eating well for 3-4 weeks PTA because of weakness and decreased appetite. Reported eating 100% of her meal tray this morning and wishes "they wouldn't keep giving me soup and things that run right through me." Pt does not like milky supplements and asked to please not be sent any. RD educated pt on Prostat and Magic cup and emphasized the importance of protein intake to aid in wound healing and preventing loss of muscle mass. Per documented weight history, pt is weight stable with minimal fluctuations. Will add supplements to encourage wound healing.  Labs reviewed: creat 1.2; K 3.1; Magnesium 1.8; hemoglobin 9.9; Hct 30.4 Medications reviewed: Insulin 0-9 units; Heparin; lactated ringers; Rocephin  NUTRITION - FOCUSED PHYSICAL EXAM:    Most Recent Value  Orbital Region  Moderate depletion  Upper Arm Region  Moderate  depletion  Thoracic and Lumbar Region  Moderate depletion  Buccal Region  Mild depletion  Temple Region  Moderate depletion  Clavicle Bone Region  Moderate depletion  Clavicle and Acromion Bone Region  Moderate depletion  Scapular Bone Region  Mild depletion  Dorsal Hand  Moderate depletion  Patellar Region  Moderate depletion  Anterior Thigh Region  Moderate depletion  Posterior Calf Region  Moderate depletion  Edema (RD Assessment)  None  Hair  Reviewed  Eyes  Reviewed  Mouth  Reviewed  Skin  Reviewed  Nails  Reviewed       Diet Order:   Diet Order            Diet full liquid Room service appropriate? Yes; Fluid consistency: Thin  Diet effective now              EDUCATION NEEDS:   Education needs have been addressed  Skin:  Skin Assessment: Skin Integrity Issues: Skin Integrity Issues:: Diabetic Ulcer Diabetic Ulcer: left foot  Last BM:  11/18  Height:   Ht Readings from Last 1 Encounters:  03/24/19 5' (1.524 m)    Weight:   Wt Readings from Last 1 Encounters:  03/27/19 57.6 kg    Ideal Body Weight:  45.5 kg  BMI:  Body mass index is 24.8 kg/m.  Estimated Nutritional Needs:   Kcal:  1300-1500 calories  Protein:  65-75 grams  Fluid:  1.4-1.5 L  Meda Klinefelter, Dietetic Intern

## 2019-03-27 NOTE — Progress Notes (Signed)
[place] for heparin Indication: atrial fibrillation  Allergies  Allergen Reactions  . Sulfa Antibiotics Nausea And Vomiting    Patient Measurements: Height: 5' (152.4 cm) Weight: 125 lb (56.7 kg) IBW/kg (Calculated) : 45.5 Heparin Dosing Weight:   Vital Signs: Temp: 98.3 F (36.8 C) (11/18 2110) Temp Source: Oral (11/18 2110) BP: 118/62 (11/18 2110) Pulse Rate: 93 (11/18 2110)  Labs: Recent Labs    03/24/19 1747 03/24/19 2331 03/24/19 2332 03/25/19 0020  03/25/19 1132  03/26/19 0543 03/26/19 1518 03/26/19 2354  HGB 13.7  --   --  12.3  --  11.8*  --  9.8*  --   --   HCT 43.0  --   --  38.6  --  36.4  --  29.1*  --   --   PLT 244  --   --  233  --  210  --  160  --   --   APTT  --   --  32  --   --   --   --   --   --   --   LABPROT  --   --  13.7  --   --   --   --   --   --   --   INR  --   --  1.1  --   --   --   --   --   --   --   HEPARINUNFRC  --   --   --   --    < >  --    < > 0.28* 0.35 0.41  CREATININE 2.33*  --   --  1.93*  --  1.73*  --  1.37*  --   --   TROPONINIHS 16 19*  --   --   --   --   --   --   --   --    < > = values in this interval not displayed.    Estimated Creatinine Clearance: 26.3 mL/min (A) (by C-G formula based on SCr of 1.37 mg/dL (H)).   Medical History: Past Medical History:  Diagnosis Date  . A-fib ([place])   . Arthritis   . Diabetes mellitus without complication ([place])   . History of kidney stones   . Hyperlipidemia   . Hypertension   . PONV (postoperative nausea and vomiting)   . Sepsis ([place])     Medications:  Scheduled:  . insulin aspart  0-9 Units Subcutaneous TID WC  . metoprolol tartrate  75 mg Oral BID  . simvastatin  40 mg Oral Daily  . tamsulosin  0.4 mg Oral Daily    Assessment: Patient admitted for weakness found to have hydronephrosis s/t to pyelonephritis and is in afib w/ RVR apparently new onset no anticoagulation PTA. Patient is being started on heparin drip for  anticoagulation for afib.  11/16: Will bolus w/ heparin 3000 units IV x 1 Will start rate at 650 units/hr. 11/17 0559 HL=0.59. Will continue current rate 11/17 1529 HL=0.48. Level was scheduled for 1400 today and was drawn 1.5 hours late. HL is therapeutic x2.  11/18 @ 0600 HL 0.28 subtherapeutic.  11/18 @ 1518 HL 0.35 therapeutic x1 s/p rate increase this morning/ Confirmed no heparin interruptions.   Goal of Therapy:  Heparin level 0.3-0.7 units/ml Monitor platelets by anticoagulation protocol: Yes   Plan:  11/18 @ 2330 HL 0.41 therapeutic. Will continue current rate and will  recheck w/ am labs. Will continue to monitor.  Thomasene Ripple, PharmD, BCPS Clinical Pharmacist 03/27/2019

## 2019-03-27 NOTE — Progress Notes (Signed)
Walked into room just as patient was preparing to take home medication triamterine/HCTZ. Husband had brought in several medication bottles in [facility] bag. Explained to patient that she was not able to take home medications while in hospital.  She became upset stating that we were not giving her her medication while she was here.  Reviewed with patient that due to AKI it was being held temporarily. Verified with husband that no other medications had been given from bag.  Asked husband to take medications home and we would provide all medications while she was hospitalized.  Understanding was verbalized.

## 2019-03-27 NOTE — Progress Notes (Signed)
ID Called for a cdiff test approval\78 yr female intermittent losse stotls been on antibiotics forUTI for a long period of time intermittently. This admission was for weakness, AFbi RVR, UA showed wbc and started on ceftriaxone No fveer, no WBC Stool 1 type 6 in 24 hrs Spoke to Dr.Griffith :no abdominal pain Dysuria Recommend Stopping IV ceftriaxone as patient does not have symptoms of UTI  Dysuria- r/o vaginal yeast Has bowel and urinary Incontinence could be due to pelvic floor  weakness- Genitourinary syndrome of menopause- advise kegel exercise   Also she had AKI which contributed to her presentation and with fluids it has resolved. UC - multiple species- which is a contaminant Clincally and lab wise does not fit the criteria for cdiff.  Recommend  DC ceftriaxone Avoid treating bacteria in the urine unless she has systemic illness Probiotic 1 a day can be tried Cdiff test not needed

## 2019-03-27 NOTE — Progress Notes (Signed)
Urology Inpatient Progress Note  Subjective: Tiffany Manning is a 78 y.o. female admitted on 03/24/2019 with a 2-week history of weakness and anorexia with elevated creatinine and A. fib with RVR.  UA concerning for infection, urine culture ultimately resulted with multiple species detected, recollection suggested.  On antibiotics as below.  Creatinine down to baseline today, 1.02.  WBC count also down to 6.7.  Repeat urine culture order placed, not yet collected.  Today, patient reports dysuria and urinary incontinence.  She states the urinary incontinence has been ongoing for the past year.  She also describes a 3 to 4-day history of diarrhea.  Anti-infectives: Anti-infectives (From admission, onward)   Start     Dose/Rate Route Frequency Ordered Stop   03/25/19 2100  cefTRIAXone (ROCEPHIN) 2 g in sodium chloride 0.9 % 100 mL IVPB     2 g 200 mL/hr over 30 Minutes Intravenous Every 24 hours 03/24/19 2157     03/24/19 2015  cefTRIAXone (ROCEPHIN) 1 g in sodium chloride 0.9 % 100 mL IVPB     1 g 200 mL/hr over 30 Minutes Intravenous  Once 03/24/19 2000 03/24/19 2142      Current Facility-Administered Medications  Medication Dose Route Frequency Provider Last Rate Last Dose  . acetaminophen (TYLENOL) tablet 650 mg  650 mg Oral Q6H PRN Eduard Clos, MD   650 mg at 03/25/19 1657   Or  . acetaminophen (TYLENOL) suppository 650 mg  650 mg Rectal Q6H PRN Eduard Clos, MD      . cefTRIAXone (ROCEPHIN) 2 g in sodium chloride 0.9 % 100 mL IVPB  2 g Intravenous Q24H Eduard Clos, MD   Stopped at 03/26/19 2148  . heparin ADULT infusion 100 units/mL (25000 units/212mL sodium chloride 0.45%)  750 Units/hr Intravenous Continuous Esaw Grandchild A, DO 7.5 mL/hr at 03/27/19 0753 750 Units/hr at 03/27/19 0753  . insulin aspart (novoLOG) injection 0-9 Units  0-9 Units Subcutaneous TID WC Eduard Clos, MD   2 Units at 03/26/19 1713  . lactated ringers 1,000 mL with  potassium chloride 20 mEq infusion   Intravenous Continuous Esaw Grandchild A, DO 100 mL/hr at 03/27/19 0753    . loperamide (IMODIUM) capsule 2 mg  2 mg Oral PRN Rodolph Bong, MD   2 mg at 03/25/19 1958  . metoprolol tartrate (LOPRESSOR) tablet 75 mg  75 mg Oral BID Eduard Clos, MD   75 mg at 03/26/19 2121  . ondansetron (ZOFRAN) tablet 4 mg  4 mg Oral Q6H PRN Eduard Clos, MD       Or  . ondansetron [facility]) injection 4 mg  4 mg Intravenous Q6H PRN Eduard Clos, MD      . simvastatin (ZOCOR) tablet 40 mg  40 mg Oral Daily Eduard Clos, MD   40 mg at 03/26/19 1753  . tamsulosin (FLOMAX) capsule 0.4 mg  0.4 mg Oral Daily Eduard Clos, MD   0.4 mg at 03/26/19 2121     Objective: Vital signs in last 24 hours: Temp:  [97.6 F (36.4 C)-98.3 F (36.8 C)] 97.9 F (36.6 C) (11/19 0530) Pulse Rate:  [84-93] 84 (11/19 0530) Resp:  [16-19] 16 (11/19 0530) BP: (92-118)/(50-66) 117/66 (11/19 0530) SpO2:  [96 %-100 %] 96 % (11/19 0530) Weight:  [57.6 kg] 57.6 kg (11/19 0434)  Intake/Output from previous day: 11/18 0701 - 11/19 0700 In: 3262.3 [P.O.:480; I.V.:2482.3; IV Piggyback:300] Out: -  Intake/Output this shift: Total I/O In:  1601.6 [I.V.:1501.6; IV Piggyback:100] Out: -   Physical Exam Vitals signs and nursing note reviewed.  Constitutional:      General: She is not in acute distress.    Appearance: She is not ill-appearing, toxic-appearing or diaphoretic.  HENT:     Head: Normocephalic and atraumatic.  Pulmonary:     Effort: Pulmonary effort is normal. No respiratory distress.  Skin:    General: Skin is warm and dry.  Neurological:     Mental Status: She is alert.  Psychiatric:        Mood and Affect: Mood normal.        Behavior: Behavior normal.    Lab Results:  Recent Labs    03/26/19 0543 03/27/19 0612  WBC 7.6 6.7  HGB 9.8* 9.9*  HCT 29.1* 30.4*  PLT 160 135*   BMET Recent Labs    03/26/19 0543 03/27/19 0612   NA 141 143  K 2.7* 3.1*  CL 97* 103  CO2 28 29  GLUCOSE 331* 98  BUN 25* 14  CREATININE 1.37* 1.02*  CALCIUM 7.3* 7.6*   PT/INR Recent Labs    03/24/19 2332  LABPROT 13.7  INR 1.1   Assessment & Plan: 78 year old female here with febrile UTI with AKI.  Creatinine normalized, WBC WNL on broad-spectrum antibiotics.  Plan for urine culture recollection today.  We will need this to narrow therapy and transition to oral antibiotics.  If she continues have to have frequent diarrhea, recommend C. difficile testing.  Debroah Loop, PA-C 03/27/2019

## 2019-03-27 NOTE — Progress Notes (Signed)
PT Cancellation Note  Patient Details Name: Tiffany Manning MRN: [id number] DOB: 07-May-1941   Cancelled Treatment:    Reason Eval/Treat Not Completed: Patient declined, no reason specified(Chart reviewed, RN consulted. Pt received in bed, husband at bedside. Pt expressing in great detail her disinterest to work with PT at this time. She described her current state as a lack of feeling well, rather than a lack of capability.) Author explained desire to be made aware of any acute weakness, disequilibrium, and/or activity tolerance deficits, so that a plan could be laid out for return to PLOF. Pt not interested. Historically, pt has been uninterested in working with PT at prior admissions. Will attempt again at later date/time.   11:48 AM, 03/27/19 Etta Grandchild, PT, DPT Physical Therapist - [facility]  [phone] ([name])     Buccola,[name] 03/27/2019, 11:46 AM

## 2019-03-27 NOTE — Progress Notes (Signed)
[place] for heparin Indication: atrial fibrillation  Allergies  Allergen Reactions  . Sulfa Antibiotics Nausea And Vomiting    Patient Measurements: Height: 5' (152.4 cm) Weight: 126 lb 15.8 oz (57.6 kg) IBW/kg (Calculated) : 45.5 Heparin Dosing Weight:   Vital Signs: Temp: 97.9 F (36.6 C) (11/19 0530) Temp Source: Oral (11/19 0530) BP: 117/66 (11/19 0530) Pulse Rate: 84 (11/19 0530)  Labs: Recent Labs    03/24/19 1747 03/24/19 2331 03/24/19 2332  03/25/19 1132  03/26/19 0543 03/26/19 1518 03/26/19 2354 03/27/19 0612  HGB 13.7  --   --    < > 11.8*  --  9.8*  --   --  9.9*  HCT 43.0  --   --    < > 36.4  --  29.1*  --   --  30.4*  PLT 244  --   --    < > 210  --  160  --   --  135*  APTT  --   --  32  --   --   --   --   --   --   --   LABPROT  --   --  13.7  --   --   --   --   --   --   --   INR  --   --  1.1  --   --   --   --   --   --   --   HEPARINUNFRC  --   --   --    < >  --    < > 0.28* 0.35 0.41 0.34  CREATININE 2.33*  --   --    < > 1.73*  --  1.37*  --   --  1.02*  TROPONINIHS 16 19*  --   --   --   --   --   --   --   --    < > = values in this interval not displayed.    Estimated Creatinine Clearance: 35.5 mL/min (A) (by C-G formula based on SCr of 1.02 mg/dL (H)).   Medical History: Past Medical History:  Diagnosis Date  . A-fib ([place])   . Arthritis   . Diabetes mellitus without complication ([place])   . History of kidney stones   . Hyperlipidemia   . Hypertension   . PONV (postoperative nausea and vomiting)   . Sepsis ([place])     Medications:  Scheduled:  . insulin aspart  0-9 Units Subcutaneous TID WC  . metoprolol tartrate  75 mg Oral BID  . simvastatin  40 mg Oral Daily  . tamsulosin  0.4 mg Oral Daily    Assessment: Patient admitted for weakness found to have hydronephrosis s/t to pyelonephritis and is in afib w/ RVR apparently new onset no anticoagulation PTA. Patient is being started on  heparin drip for anticoagulation for afib.  11/16: Will bolus w/ heparin 3000 units IV x 1 Will start rate at 650 units/hr. 11/17 0559 HL=0.59. Will continue current rate 11/17 1529 HL=0.48. Level was scheduled for 1400 today and was drawn 1.5 hours late. HL is therapeutic x2.  11/18 @ 0600 HL 0.28 subtherapeutic.  11/18 @ 1518 HL 0.35 therapeutic x1 s/p rate increase this morning/ Confirmed no heparin interruptions.   Goal of Therapy:  Heparin level 0.3-0.7 units/ml Monitor platelets by anticoagulation protocol: Yes   Plan:  11/19 @ 0500 HL 0.34 therapeutic. Will continue  current rate and will recheck w/ am labs. Will continue to monitor.  Thomasene Ripple, PharmD, BCPS Clinical Pharmacist 03/27/2019

## 2019-03-28 LAB — BASIC METABOLIC PANEL
Anion gap: 9 (ref 5–15)
BUN: 19 mg/dL (ref 8–23)
CO2: 29 mmol/L (ref 22–32)
Calcium: 7.5 mg/dL — ABNORMAL LOW (ref 8.9–10.3)
Chloride: 104 mmol/L (ref 98–111)
Creatinine, Ser: 1.05 mg/dL — ABNORMAL HIGH (ref 0.44–1.00)
GFR calc Af Amer: 58 mL/min — ABNORMAL LOW (ref >60)
GFR calc non Af Amer: 50 mL/min — ABNORMAL LOW (ref >60)
Glucose, Bld: 130 mg/dL — ABNORMAL HIGH (ref 70–99)
Potassium: 4.3 mmol/L (ref 3.5–5.1)
Sodium: 142 mmol/L (ref 135–145)

## 2019-03-28 LAB — CBC
HCT: 31.8 % — ABNORMAL LOW (ref 36.0–46.0)
Hemoglobin: 9.7 g/dL — ABNORMAL LOW (ref 12.0–15.0)
MCH: 33.4 pg (ref 26.0–34.0)
MCHC: 30.5 g/dL (ref 30.0–36.0)
MCV: 109.7 fL — ABNORMAL HIGH (ref 80.0–100.0)
Platelets: 141 10*3/uL — ABNORMAL LOW (ref 150–400)
RBC: 2.9 MIL/uL — ABNORMAL LOW (ref 3.87–5.11)
RDW: 13.5 % (ref 11.5–15.5)
WBC: 6.5 10*3/uL (ref 4.0–10.5)
nRBC: 0 % (ref 0.0–0.2)

## 2019-03-28 LAB — MAGNESIUM: Magnesium: 1.9 mg/dL (ref 1.7–2.4)

## 2019-03-28 LAB — URINE CULTURE: Culture: NO GROWTH

## 2019-03-28 LAB — HEPARIN LEVEL (UNFRACTIONATED): Heparin Unfractionated: 0.29 IU/mL — ABNORMAL LOW (ref 0.30–0.70)

## 2019-03-28 LAB — GLUCOSE, CAPILLARY: Glucose-Capillary: 106 mg/dL — ABNORMAL HIGH (ref 70–99)

## 2019-03-28 MED ORDER — ASCORBIC ACID 250 MG PO TABS: 250.0000 mg | ORAL_TABLET | Freq: Two times a day (BID) | ORAL | Status: DC

## 2019-03-28 MED ORDER — ADULT MULTIVITAMIN W/MINERALS CH: 1.0000 | ORAL_TABLET | Freq: Every day | ORAL | Status: DC

## 2019-03-28 MED ORDER — PRO-STAT SUGAR FREE PO LIQD: 30.0000 mL | Freq: Two times a day (BID) | ORAL | 0 refills | Status: DC

## 2019-03-28 MED ORDER — RISAQUAD PO CAPS: 1.0000 | ORAL_CAPSULE | Freq: Every day | ORAL | 0 refills | Status: DC

## 2019-03-28 NOTE — Discharge Summary (Addendum)
Physician Discharge Summary  CHARISH SCHROEPFER ZOX:[id number] DOB: 1940-11-08 DOA: 03/24/2019  PCP: Jaclyn Shaggy, MD  Admit date: 03/24/2019 Discharge date: 03/28/2019  Admitted From: Home Disposition:  Home   Recommendations for Outpatient Follow-up:  1. Follow up with PCP in 1-2 weeks 2. Please obtain BMP/CBC in one week 3. Please follow up with urology as previously planned  Home Health: No  Equipment/Devices: None  OF NOTE: Patient repeatedly refused to ambulate with PT, nursing and this physician during admission, therefore her safety with ambulation was unable to be assessed.  Patient and her husband, who was at bedside, insist that she has no problem ambulating and have no concern for patient's safety upon going home.  Advised patient of concern for weakness due to being in hospital bed for days, concern for a fall and serious injury including hip fracture.  Patient again declined.   Discharge Condition: Stable CODE STATUS: DNR Diet recommendation: Dysphagia Level 3, thin liquids, nutritional supplement   Brief/Interim Summary:  78 y.o.femalewithhistory of hypertension, hyperlipidemia, recurrent urinary tract infections on chronic suppressive antibiotic, diabetes mellitus, A. fib was brought to the ER after patient was found to be increasingly weak over the last 2 weeks with poor appetite andintermittentnausea vomiting.  In the ED, patient in a-fib with RVR which was controlled with single dose IV Cardizem. Labs notable for creatinine 2.33, up from 1.3 in October. UA consistent with possible UTI with large leukocytes, few bacteria and > 50 wbc's, epithelial cell present. CT renal study was done which shows features concerning for left-sided hydroureteronephrosis. No obstructing stone seen. Patient started on ceftriaxone IV fluids admitted for A. fib with RVR with acute renal failure and UTI.    Urine culture returned with multiple species, likely contamination.  Patient  afebrile and without leukocytosis.  Discussed case with ID, recommended stopping antibiotics and monitoring, and to avoid treating bacteruria unless signs of systemic illness present.  Dysuria can be caused by vaginal yeast or genitourinary syndrome of menopause with atrophic vaginitis.  Wet prep ordered to evaluate for yeast, however patient and husband adamant for discharge.  Will recommend wet prep to rule out yeast at PCP or urology follow up.  Patient remains afebrile off antibiotics and vitals stable.  Acute kidney injury resolved with IV hydration, creatinine 1.05 today.  Regarding A-fib, patient's HR remained controlled throughout admission.  On chart review, it appears she used to be on Eliquis given CHA2D2S-VASc of 4, however this was stopped due to recurrent hematuria.    Regarding diarrhea, C diff testing not indicated as patient has intermittent loose stools, and only single type 6 in 24 hours, no leukocytosis.  Probiotics were started given chronic use of antibiotics.      Discharge Diagnoses: Active Problems:   Acute renal failure (HCC)   Atrial fibrillation with rapid ventricular response (HCC)   ARF (acute renal failure) (HCC)   Lower urinary tract infectious disease   DM (diabetes mellitus), type 2 with renal complications (HCC)   Malnutrition of moderate degree  A. fib with RVR- resolved Likely secondary infection vs dehydration. Known history of A. Fib.  CHA2DS2-VASc of at least 4.[place] anticoagulation long-term, Eliquis stopped previously due to recurrent hematuria.  Converted and currently in sinus rhythm and HR controlled.  -continue home metoprolol 75 mgBID  - stoppedheparindrip   History Recurrent UTI's  Lower urinary tract infection vs chronic bacteruria On chronic suppressive antibiotics. Follows with urology.  Initial urine culture grew multiple species, recollection recommended & ordered,  but this is likely contamination or colonization.  Patient has  been afebrile, without leukocytosis.  Case discussed with ID, recommended stopping antibiotics and monitor.  Dysuria could be due to postmenopausal atrophic vaginitis or yeast infection given chronic antibiotics she is at higher risk.     - repeat urine culture pending - stop Rocephin and monitor for fever, leukocytosis or worsening symptoms - continue probiotics - Recommend vaginal wet prep at PCP follow up to evaluate for yeast  Acute renal failure- resolved Metabolic acidosissecondary to renal failure Likely multifactorial secondary to prerenal azotemia from poor oral intake, nausea vomiting dehydration. Initially on bicarb drip, stopped. - stopped fluids - monitor BMP  Hypokalemia- repleting Secondary to vomiting and diarrhea. Monitor BMP. - replete as needed for K>4.0  Diarrhea - chronic and intermittent, inconsistent with C diff infection.  Discussed with ID, and C diff testing not indicated at this time with no leukocytosis and intermittent nature of loose stools. - added probiotics  Chronic left-sided hydrouteronephrosis Being followed by urology in the outpatient setting. Per urology note plan was for stent or other procedure. CT with no acute obstruction noted however patient acute renal failure.  - Urology following  Diabetes mellitus type 2 Hemoglobin A1c was 7.8 on 01/30/2018. Repeat hemoglobin A1c. CBG of 85 this morning. Place on IV fluids. Sliding scale insulin.  Moderate Protein Calorie Malnutrition Malnutrition Type: Nutrition Problem: Moderate Malnutrition Etiology: chronic illness(chronic UTI, Advanced age) Malnutrition Characteristics: Signs/Symptoms: moderate fat depletion, mild fat depletion, moderate muscle depletion, mild muscle depletion Nutrition Interventions: Interventions: Liberalize Diet, Prostat, MVI, Magic cup, Other (Comment)(Vitamin C) - dietician consulted  Hypertension Triamterene HCTZ on hold secondary to dehydration and  acute renal failure. Continue metoprolol.  Hyperlipidemia Continue statin.  Chronic left lower extremity wound Wound care consulted.    Discharge Instructions   Discharge Instructions    Call MD for:  persistant nausea and vomiting   Complete by: As directed    Call MD for:  temperature >100.4   Complete by: As directed    Diet - low sodium heart healthy   Complete by: As directed    Increase activity slowly   Complete by: As directed      Allergies as of 03/28/2019      Reactions   Sulfa Antibiotics Nausea And Vomiting      Medication List    TAKE these medications   acetaminophen 325 MG tablet Commonly known as: TYLENOL Take 2 tablets (650 mg total) by mouth every 6 (six) hours as needed for mild pain (or Fever >/= 101).   acidophilus Caps capsule Take 1 capsule by mouth daily. Start taking on: March 29, 2019   ascorbic acid 250 MG tablet Commonly known as: VITAMIN C Take 1 tablet (250 mg total) by mouth 2 (two) times daily.   feeding supplement (PRO-STAT SUGAR FREE 64) Liqd Take 30 mLs by mouth 2 (two) times daily.   glipiZIDE 5 MG tablet Commonly known as: GLUCOTROL Take 0.5 tablets (2.5 mg total) by mouth daily before breakfast.   Metoprolol Tartrate 75 MG Tabs Take 75 mg by mouth 2 (two) times daily.   multivitamin with minerals Tabs tablet Take 1 tablet by mouth daily. Start taking on: March 29, 2019   ranitidine 150 MG capsule Commonly known as: ZANTAC Take 150 mg by mouth 2 (two) times daily as needed for heartburn.   simvastatin 40 MG tablet Commonly known as: ZOCOR Take 40 mg by mouth daily.   tamsulosin 0.4 MG Caps  capsule Commonly known as: FLOMAX Take 1 capsule (0.4 mg total) by mouth daily.   triamterene-hydrochlorothiazide 37.5-25 MG capsule Commonly known as: DYAZIDE Take 1 capsule by mouth 2 (two) times daily.   trimethoprim 100 MG tablet Commonly known as: TRIMPEX Take 1 tablet (100 mg total) by mouth daily.        Allergies  Allergen Reactions  . Sulfa Antibiotics Nausea And Vomiting    Consultations:  Urology   Procedures/Studies: Ct Renal Stone Study  Result Date: 03/24/2019 CLINICAL DATA:  Flank pain EXAM: CT ABDOMEN AND PELVIS WITHOUT CONTRAST TECHNIQUE: Multidetector CT imaging of the abdomen and pelvis was performed following the standard protocol without IV contrast. COMPARISON:  Ultrasound 12/24/2018, CT 01/28/2018, 09/19/2011 FINDINGS: Lower chest: Stable nodularity in the right middle lobe. 5 mm right middle lobe pulmonary nodule, decreased compared to prior measurement of 7 mm. No acute consolidation or effusion. Coronary vascular calcification. Heart size within normal limits. Small hiatal hernia. Hepatobiliary: No focal hepatic abnormality. Status post cholecystectomy. No significant biliary dilatation Pancreas: No acute inflammatory changes. Pancreas is atrophic and contains a few parenchymal calcifications. Fifteen mm hypodense nodule adjacent to or exophytic to the pancreas body, stable to slight increase in size. Spleen: Normal in size without focal abnormality. Adrenals/Urinary Tract: Adrenal glands are normal. Lobulated atrophic left kidney. Moderate left and mild right hydronephrosis, decreased compared to prior study. Left hydroureter with stranding about the distal left ureter. No stones. Mild increased density within the posterior bladder. Small cyst lower pole left kidney. Stomach/Bowel: The stomach is nonenlarged. No dilated small bowel. Collapsed appearance of transverse colon. Possible wall thickening of the ascending colon. Vascular/Lymphatic: Moderate aortic atherosclerosis without aneurysm. No significantly enlarged lymph nodes. Reproductive: Status post hysterectomy. No adnexal masses. Other: Negative for free air or free fluid. Moderate fat containing infraumbilical hernia. Musculoskeletal: No acute or suspicious osseous abnormality. Similar scattered foci of sclerosis  within the hips and pelvis. IMPRESSION: 1. Moderate left and mild right hydronephrosis with left hydroureter, though decreased compared to the prior CT. No ureteral stone is seen. Left ureter is dilated down to the level of the bladder. Cortical scarring and atrophy of the left kidney. 2. Mild increased density at the posterior bladder, possible small stones. 3. 15 mm hypodense nodule possibly arising from the pancreatic body. Nonemergent MRI recommended for further evaluation given increasing size as compared with prior exams. 4. 5 mm right middle lobe pulmonary nodule, decreased in size 5. Possible wall thickening of the ascending colon though without significant surrounding inflammatory change. Correlation with colonoscopy suggested to exclude mass in the region. 6. Moderate fat containing infraumbilical ventral hernia Electronically Signed   By: Jasmine Pang M.D.   On: 03/24/2019 21:31         Subjective: Patient seen, husband at bedside.  She reports feeling fairly well today, better.  No fever/chills, N/V/D, chest pain or SOB.  Patient advised of need to ambulate to evaluate safety for discharge.  She adamantly refuses, several times, finally yelling at this physician to stop aggravating her, stating she can get up and walk just fine.  Insists on discharge home without evaluation of ambulatory status.  Husband at bedside states patient has no trouble at baseline.  Husband also encouraged patient to walk so we can evaluate, but she continued to refuse.   Discharge Exam: Vitals:   03/28/19 0355 03/28/19 0947  BP: 124/64 114/62  Pulse: 86 80  Resp: 16   Temp: 98 F (36.7 C)   SpO2:  97%    Vitals:   03/27/19 2139 03/28/19 0355 03/28/19 0410 03/28/19 0947  BP: 122/60 124/64  114/62  Pulse: 84 86  80  Resp:  16    Temp: 97.6 F (36.4 C) 98 F (36.7 C)    TempSrc: Oral Oral    SpO2: 97% 97%    Weight:   59 kg   Height:        General: Pt is alert, awake, not in acute  distress Cardiovascular:  RRR, S1/S2 +, no rubs, no gallops Respiratory: CTA bilaterally, no wheezing, no rhonchi Abdominal: Soft, NT, ND, bowel sounds + Extremities: mild nonpitting pedal edema, no cyanosis    The results of significant diagnostics from this hospitalization (including imaging, microbiology, ancillary and laboratory) are listed below for reference.     Microbiology: Recent Results (from the past 240 hour(s))  Urine culture     Status: Abnormal   Collection Time: 03/24/19  5:47 PM   Specimen: Urine, Random  Result Value Ref Range Status   Specimen Description   Final    URINE, RANDOM Performed at [facility], [address], [place], [place] [address]    Special Requests   Final    NONE Performed at [facility], [address] [place], [place] [address]    Culture MULTIPLE SPECIES PRESENT, SUGGEST RECOLLECTION (A)  Final   Report Status 03/25/2019 FINAL  Final  SARS CORONAVIRUS 2 (TAT 6-24 HRS) Nasopharyngeal Nasopharyngeal Swab     Status: None   Collection Time: 03/24/19  8:57 PM   Specimen: Nasopharyngeal Swab  Result Value Ref Range Status   SARS Coronavirus 2 NEGATIVE NEGATIVE Final    Comment: (NOTE) SARS-CoV-2 target nucleic acids are NOT DETECTED. The SARS-CoV-2 RNA is generally detectable in upper and lower respiratory specimens during the acute phase of infection. Negative results do not preclude SARS-CoV-2 infection, do not rule out co-infections with other pathogens, and should not be used as the sole basis for treatment or other patient management decisions. Negative results must be combined with clinical observations, patient history, and epidemiological information. The expected result is Negative. Fact Sheet for Patients: HairSlick.no Fact Sheet for Healthcare Providers: quierodirigir.com This test is not yet approved or cleared by the [place] FDA and  has  been authorized for detection and/or diagnosis of SARS-CoV-2 by FDA under an Emergency Use Authorization (EUA). This EUA will remain  in effect (meaning this test can be used) for the duration of the COVID-19 declaration under Section 56 4(b)(1) of the Act, 21 U.S.C. section 360bbb-3(b)(1), unless the authorization is terminated or revoked sooner. Performed at [facility], [address], [place], [place] [address]   Culture, blood (routine x 2)     Status: None (Preliminary result)   Collection Time: 03/25/19 11:32 AM   Specimen: BLOOD  Result Value Ref Range Status   Specimen Description BLOOD BLOOD LEFT FOREARM  Final   Special Requests   Final    BOTTLES DRAWN AEROBIC AND ANAEROBIC Blood Culture adequate volume   Culture   Final    NO GROWTH 3 DAYS Performed at [facility], [address] [place], [place] [address]    Report Status PENDING  Incomplete  Culture, blood (routine x 2)     Status: None (Preliminary result)   Collection Time: 03/25/19 11:32 AM   Specimen: BLOOD  Result Value Ref Range Status   Specimen Description BLOOD RIGHT ANTECUBITAL  Final   Special Requests  Final    BOTTLES DRAWN AEROBIC AND ANAEROBIC Blood Culture adequate volume   Culture   Final    NO GROWTH 3 DAYS Performed at [facility], [address] [place], [place] [address]    Report Status PENDING  Incomplete     Labs: BNP (last 3 results) No results for input(s): BNP in the last 8760 hours. Basic Metabolic Panel: Recent Labs  Lab 03/25/19 0020 03/25/19 1132 03/26/19 0543 03/27/19 0612 03/28/19 0630  NA 141 141 141 143 142  K 2.8* 2.9* 2.7* 3.1* 4.3  CL 112* 112* 97* 103 104  CO2 12* 16* 28 29 29   GLUCOSE 96 159* 331* 98 130*  BUN 40* 35* 25* 14 19  CREATININE 1.93* 1.73* 1.37* 1.02* 1.05*  CALCIUM 8.3* 8.3* 7.3* 7.6* 7.5*  MG  --  1.9 1.6* 1.8 1.9   Liver Function Tests: Recent Labs  Lab 03/25/19 0020 03/25/19 1132 03/26/19 0543  AST 25  26 23   ALT 51* 45* 26  ALKPHOS 103 92 68  BILITOT 0.9 1.0 0.8  PROT 6.3* 5.8* 4.7*  ALBUMIN 3.2* 3.0* 2.3*   No results for input(s): LIPASE, AMYLASE in the last 168 hours. No results for input(s): AMMONIA in the last 168 hours. CBC: Recent Labs  Lab 03/24/19 1747 03/25/19 0020 03/25/19 1132 03/26/19 0543 03/27/19 0612 03/28/19 0630  WBC 11.2* 11.3* 10.5 7.6 6.7 6.5  NEUTROABS 8.9* 7.8* 7.7  --   --   --   HGB 13.7 12.3 11.8* 9.8* 9.9* 9.7*  HCT 43.0 38.6 36.4 29.1* 30.4* 31.8*  MCV 104.9* 106.6* 105.2* 99.0 104.8* 109.7*  PLT 244 233 210 160 135* 141*   Cardiac Enzymes: No results for input(s): CKTOTAL, CKMB, CKMBINDEX, TROPONINI in the last 168 hours. BNP: Invalid input(s): POCBNP CBG: Recent Labs  Lab 03/27/19 0750 03/27/19 1200 03/27/19 1630 03/27/19 2141 03/28/19 0753  GLUCAP 90 136* 229* 103* 106*   D-Dimer No results for input(s): DDIMER in the last 72 hours. Hgb A1c Recent Labs    03/26/19 0543  HGBA1C 6.4*   Lipid Profile No results for input(s): CHOL, HDL, LDLCALC, TRIG, CHOLHDL, LDLDIRECT in the last 72 hours. Thyroid function studies No results for input(s): TSH, T4TOTAL, T3FREE, THYROIDAB in the last 72 hours.  Invalid input(s): FREET3 Anemia work up No results for input(s): VITAMINB12, FOLATE, FERRITIN, TIBC, IRON, RETICCTPCT in the last 72 hours. Urinalysis    Component Value Date/Time   COLORURINE AMBER (A) 03/24/2019 1747   APPEARANCEUR TURBID (A) 03/24/2019 1747   APPEARANCEUR Cloudy 10/18/2011 1616   LABSPEC 1.020 03/24/2019 1747   LABSPEC 1.009 10/18/2011 1616   PHURINE 7.0 03/24/2019 1747   GLUCOSEU NEGATIVE 03/24/2019 1747   GLUCOSEU Negative 10/18/2011 1616   HGBUR SMALL (A) 03/24/2019 1747   BILIRUBINUR NEGATIVE 03/24/2019 1747   BILIRUBINUR Negative 10/18/2011 1616   KETONESUR 5 (A) 03/24/2019 1747   PROTEINUR 100 (A) 03/24/2019 1747   NITRITE NEGATIVE 03/24/2019 1747   LEUKOCYTESUR LARGE (A) 03/24/2019 1747    LEUKOCYTESUR 2+ 10/18/2011 1616   Sepsis Labs Invalid input(s): PROCALCITONIN,  WBC,  LACTICIDVEN Microbiology Recent Results (from the past 240 hour(s))  Urine culture     Status: Abnormal   Collection Time: 03/24/19  5:47 PM   Specimen: Urine, Random  Result Value Ref Range Status   Specimen Description   Final    URINE, RANDOM Performed at [facility], [address] [place], [place] [address]    Special Requests  Final    NONE Performed at [facility], [address] [place], [place] [address]    Culture MULTIPLE SPECIES PRESENT, SUGGEST RECOLLECTION (A)  Final   Report Status 03/25/2019 FINAL  Final  SARS CORONAVIRUS 2 (TAT 6-24 HRS) Nasopharyngeal Nasopharyngeal Swab     Status: None   Collection Time: 03/24/19  8:57 PM   Specimen: Nasopharyngeal Swab  Result Value Ref Range Status   SARS Coronavirus 2 NEGATIVE NEGATIVE Final    Comment: (NOTE) SARS-CoV-2 target nucleic acids are NOT DETECTED. The SARS-CoV-2 RNA is generally detectable in upper and lower respiratory specimens during the acute phase of infection. Negative results do not preclude SARS-CoV-2 infection, do not rule out co-infections with other pathogens, and should not be used as the sole basis for treatment or other patient management decisions. Negative results must be combined with clinical observations, patient history, and epidemiological information. The expected result is Negative. Fact Sheet for Patients: HairSlick.no Fact Sheet for Healthcare Providers: quierodirigir.com This test is not yet approved or cleared by the [place] FDA and  has been authorized for detection and/or diagnosis of SARS-CoV-2 by FDA under an Emergency Use Authorization (EUA). This EUA will remain  in effect (meaning this test can be used) for the duration of the COVID-19 declaration under Section 56 4(b)(1) of the Act, 21 U.S.C. section  360bbb-3(b)(1), unless the authorization is terminated or revoked sooner. Performed at [facility], [address], [place], [place] [address]   Culture, blood (routine x 2)     Status: None (Preliminary result)   Collection Time: 03/25/19 11:32 AM   Specimen: BLOOD  Result Value Ref Range Status   Specimen Description BLOOD BLOOD LEFT FOREARM  Final   Special Requests   Final    BOTTLES DRAWN AEROBIC AND ANAEROBIC Blood Culture adequate volume   Culture   Final    NO GROWTH 3 DAYS Performed at [facility], [address], [place], [place] [address]    Report Status PENDING  Incomplete  Culture, blood (routine x 2)     Status: None (Preliminary result)   Collection Time: 03/25/19 11:32 AM   Specimen: BLOOD  Result Value Ref Range Status   Specimen Description BLOOD RIGHT ANTECUBITAL  Final   Special Requests   Final    BOTTLES DRAWN AEROBIC AND ANAEROBIC Blood Culture adequate volume   Culture   Final    NO GROWTH 3 DAYS Performed at [facility], [address], [place], [place] [address]    Report Status PENDING  Incomplete     Time coordinating discharge: Over 30 minutes  SIGNED:   Pennie Banter, DO [facility] 03/28/2019, 10:48 AM Pager [phone]  If 7PM-7AM, please contact night-coverage [email or web address] Password [email or web address]

## 2019-03-28 NOTE — Progress Notes (Signed)
Tiffany Manning to be D/C'd Home per MD order. Patient walked to nursing station and back with nursing assistant and tolerated well. Discussed prescriptions and follow up appointments with the patient. Prescriptions given to patient, medication list explained in detail. Pt verbalized understanding.  Allergies as of 03/28/2019      Reactions   Sulfa Antibiotics Nausea And Vomiting      Medication List    TAKE these medications   acetaminophen 325 MG tablet Commonly known as: TYLENOL Take 2 tablets (650 mg total) by mouth every 6 (six) hours as needed for mild pain (or Fever >/= 101). Notes to patient: As needed   acidophilus Caps capsule Take 1 capsule by mouth daily. Start taking on: March 29, 2019 Notes to patient: Morning 03/29/19   ascorbic acid 250 MG tablet Commonly known as: VITAMIN C Take 1 tablet (250 mg total) by mouth 2 (two) times daily. Notes to patient: Before bed 03/28/19   feeding supplement (PRO-STAT SUGAR FREE 64) Liqd Take 30 mLs by mouth 2 (two) times daily. Notes to patient: Afternoon 03/28/19   glipiZIDE 5 MG tablet Commonly known as: GLUCOTROL Take 0.5 tablets (2.5 mg total) by mouth daily before breakfast. Notes to patient: Morning 03/29/19   Metoprolol Tartrate 75 MG Tabs Take 75 mg by mouth 2 (two) times daily. Notes to patient: Before bed 03/28/19   multivitamin with minerals Tabs tablet Take 1 tablet by mouth daily. Start taking on: March 29, 2019 Notes to patient: Morning 03/29/19   ranitidine 150 MG capsule Commonly known as: ZANTAC Take 150 mg by mouth 2 (two) times daily as needed for heartburn. Notes to patient: As needed   simvastatin 40 MG tablet Commonly known as: ZOCOR Take 40 mg by mouth daily. Notes to patient: Evening 03/28/19   tamsulosin 0.4 MG Caps capsule Commonly known as: FLOMAX Take 1 capsule (0.4 mg total) by mouth daily. Notes to patient: Morning 03/29/19   triamterene-hydrochlorothiazide 37.5-25 MG  capsule Commonly known as: DYAZIDE Take 1 capsule by mouth 2 (two) times daily. Notes to patient: Before bed 03/28/19   trimethoprim 100 MG tablet Commonly known as: TRIMPEX Take 1 tablet (100 mg total) by mouth daily. Notes to patient: Morning 03/29/19       Vitals:   03/28/19 0355 03/28/19 0947  BP: 124/64 114/62  Pulse: 86 80  Resp: 16   Temp: 98 F (36.7 C)   SpO2: 97%     Skin clean, dry and intact without evidence of skin break down, no evidence of skin tears noted. IV catheter discontinued intact. Site without signs and symptoms of complications. Dressing and pressure applied. Pt denies pain at this time. No complaints noted.  An After Visit Summary was printed and given to the patient. Patient escorted via [place], and D/C home via private auto.  Tiffany Manning 03/28/2019 11:41 AM

## 2019-03-28 NOTE — Progress Notes (Signed)
Urology Inpatient Progress Note  Subjective: Tiffany Manning is a 78 y.o. female admitted on 03/24/2019 with a 2-week history of weakness and anorexia with elevated creatinine and A. fib with RVR.  UA concerning for infection, urine culture ultimately resulted with multiple species detected, recollection suggested.  Ceftriaxone discontinued yesterday, per ID recommendations.  Creatinine stable at 1.05.  Vital signs stable.  Patient reports resolved dysuria.  No acute concerns.  Anti-infectives: Anti-infectives (From admission, onward)   Start     Dose/Rate Route Frequency Ordered Stop   03/25/19 2100  cefTRIAXone (ROCEPHIN) 2 g in sodium chloride 0.9 % 100 mL IVPB  Status:  Discontinued     2 g 200 mL/hr over 30 Minutes Intravenous Every 24 hours 03/24/19 2157 03/27/19 1229   03/24/19 2015  cefTRIAXone (ROCEPHIN) 1 g in sodium chloride 0.9 % 100 mL IVPB     1 g 200 mL/hr over 30 Minutes Intravenous  Once 03/24/19 2000 03/24/19 2142      Current Facility-Administered Medications  Medication Dose Route Frequency Provider Last Rate Last Dose  . acetaminophen (TYLENOL) tablet 650 mg  650 mg Oral Q6H PRN Eduard Clos, MD   650 mg at 03/28/19 0402   Or  . acetaminophen (TYLENOL) suppository 650 mg  650 mg Rectal Q6H PRN Eduard Clos, MD      . acidophilus (RISAQUAD) capsule 1 capsule  1 capsule Oral Daily Lynn Ito, MD   1 capsule at 03/27/19 1604  . feeding supplement (PRO-STAT SUGAR FREE 64) liquid 30 mL  30 mL Oral BID Esaw Grandchild A, DO   30 mL at 03/27/19 2142  . heparin ADULT infusion 100 units/mL (25000 units/253mL sodium chloride 0.45%)  850 Units/hr Intravenous Continuous Lowella Bandy, RPH 7.5 mL/hr at 03/27/19 1623 750 Units/hr at 03/27/19 1623  . insulin aspart (novoLOG) injection 0-9 Units  0-9 Units Subcutaneous TID WC Eduard Clos, MD   3 Units at 03/27/19 1758  . lactated ringers 1,000 mL with potassium chloride 20 mEq infusion    Intravenous Continuous Esaw Grandchild A, DO 50 mL/hr at 03/27/19 1606    . loperamide (IMODIUM) capsule 2 mg  2 mg Oral PRN Rodolph Bong, MD   2 mg at 03/25/19 1958  . metoprolol tartrate (LOPRESSOR) tablet 75 mg  75 mg Oral BID Eduard Clos, MD   75 mg at 03/27/19 2142  . multivitamin with minerals tablet 1 tablet  1 tablet Oral Daily Esaw Grandchild A, DO      . ondansetron (ZOFRAN) tablet 4 mg  4 mg Oral Q6H PRN Eduard Clos, MD       Or  . ondansetron [facility]) injection 4 mg  4 mg Intravenous Q6H PRN Eduard Clos, MD      . simvastatin (ZOCOR) tablet 40 mg  40 mg Oral Daily Eduard Clos, MD   40 mg at 03/27/19 1759  . tamsulosin (FLOMAX) capsule 0.4 mg  0.4 mg Oral Daily Eduard Clos, MD   0.4 mg at 03/27/19 2143  . vitamin C (ASCORBIC ACID) tablet 250 mg  250 mg Oral BID Esaw Grandchild A, DO   250 mg at 03/28/19 0754   Objective: Vital signs in last 24 hours: Temp:  [97.6 F (36.4 C)-98.7 F (37.1 C)] 98 F (36.7 C) (11/20 0355) Pulse Rate:  [80-86] 86 (11/20 0355) Resp:  [16] 16 (11/20 0355) BP: (109-124)/(60-73) 124/64 (11/20 0355) SpO2:  [97 %] 97 % (11/20 0355) Weight:  [  59 kg] 59 kg (11/20 0410)  Intake/Output from previous day: 11/19 0701 - 11/20 0700 In: 2397.9 [P.O.:120; I.V.:2177.9; IV Piggyback:100] Out: 350 [Urine:350] Intake/Output this shift: Total I/O In: -  Out: 125 [Urine:125]  Physical Exam Vitals signs and nursing note reviewed.  Constitutional:      General: She is not in acute distress.    Appearance: She is not ill-appearing, toxic-appearing or diaphoretic.  HENT:     Head: Normocephalic and atraumatic.  Pulmonary:     Effort: Pulmonary effort is normal. No respiratory distress.  Skin:    General: Skin is warm and dry.  Neurological:     Mental Status: She is alert.  Psychiatric:        Mood and Affect: Mood normal.        Behavior: Behavior normal.    Lab Results:  Recent Labs     03/27/19 0612 03/28/19 0630  WBC 6.7 6.5  HGB 9.9* 9.7*  HCT 30.4* 31.8*  PLT 135* 141*   BMET Recent Labs    03/27/19 0612 03/28/19 0630  NA 143 142  K 3.1* 4.3  CL 103 104  CO2 29 29  GLUCOSE 98 130*  BUN 14 19  CREATININE 1.02* 1.05*  CALCIUM 7.6* 7.5*   Assessment & Plan: 78 year old female admitted with possible UTI, improved on IV antibiotics.  Patient reports resolution of dysuria.  She is okay for discharge from the urologic perspective, no new recommendations today.  Outpatient follow-up with Dr. Diamantina Providence already scheduled.  Debroah Loop, PA-C 03/28/2019

## 2019-03-28 NOTE — Progress Notes (Signed)
03/28/2019 8:35 AM  Received a call from info desk at [facility], letting us know patient's husband is there, saying he was asked to come up early to meet with the doctor.  Asked attending MD Arbutus Ped about this.  Dr Arbutus Ped stated she did not ask the patient's husband to meet her early.  I let [facility] mall info desk know this so that the husband may come up to the unit at normal visiting hours.  Dola Argyle, RN

## 2019-03-28 NOTE — Care Management Important Message (Signed)
Important Message  Patient Details  Name: Tiffany Manning MRN: [id number] Date of Birth: 03/24/1941   Medicare Important Message Given:  Yes     Dannette Nicolette 03/28/2019, 12:32 PM

## 2019-03-28 NOTE — Progress Notes (Signed)
ANTICOAGULATION CONSULT NOTE  Pharmacy Consult for heparin Indication: atrial fibrillation  Patient Measurements: Height: 5' (152.4 cm) Weight: 130 lb (59 kg) IBW/kg (Calculated) : 45.5  Vital Signs: Temp: 98 F (36.7 C) (11/20 0355) Temp Source: Oral (11/20 0355) BP: 124/64 (11/20 0355) Pulse Rate: 86 (11/20 0355)  Labs: Recent Labs    03/25/19 1132  03/26/19 0543  03/26/19 2354 03/27/19 0612 03/28/19 0630  HGB 11.8*  --  9.8*  --   --  9.9*  --   HCT 36.4  --  29.1*  --   --  30.4*  --   PLT 210  --  160  --   --  135*  --   HEPARINUNFRC  --    < > 0.28*   < > 0.41 0.34 0.29*  CREATININE 1.73*  --  1.37*  --   --  1.02* 1.05*   < > = values in this interval not displayed.    Estimated Creatinine Clearance: 34.9 mL/min (A) (by C-G formula based on SCr of 1.05 mg/dL (H)).   Medical History: Past Medical History:  Diagnosis Date  . A-fib ([place])   . Arthritis   . Diabetes mellitus without complication ([place])   . History of kidney stones   . Hyperlipidemia   . Hypertension   . PONV (postoperative nausea and vomiting)   . Sepsis ([place])     Medications:  Scheduled:  . acidophilus  1 capsule Oral Daily  . feeding supplement (PRO-STAT SUGAR FREE 64)  30 mL Oral BID  . insulin aspart  0-9 Units Subcutaneous TID WC  . metoprolol tartrate  75 mg Oral BID  . multivitamin with minerals  1 tablet Oral Daily  . simvastatin  40 mg Oral Daily  . tamsulosin  0.4 mg Oral Daily  . vitamin C  250 mg Oral BID    Assessment: Patient admitted for weakness found to have hydronephrosis s/t to pyelonephritis and is in afib w/ RVR apparently new onset no anticoagulation PTA. Patient was started on heparin drip for anticoagulation for afib.  Heparin Course 11/16 initation 3000 unit bolus, then 650 units/hr 11/17 0559 HL 0.59 continue  11/17 1529 HL 0.48 continue 11/18 0600 HL 0.28 increase rate to 750 units/hr 11/18 1518 HL 0.35 continue 11/19 0612 HL 0.34 continue 11/20 0630  HL 0.29: increase rate to 850 units/hr  Goal of Therapy:  Heparin level 0.3-0.7 units/ml Monitor platelets by anticoagulation protocol: Yes   Plan:   Heparin level is  Subtherapeutic: increase rate to 850 units/hr  Recheck heparin level 8 hours after rate change Will continue current rate and will recheck w/ am labs.   CBC not ordered for today: add-on to am labs  Will continue to monitor.  Vallery Sa, PharmD Clinical Pharmacist 03/28/2019

## 2019-03-30 LAB — CULTURE, BLOOD (ROUTINE X 2)
Culture: NO GROWTH
Culture: NO GROWTH
Special Requests: ADEQUATE
Special Requests: ADEQUATE

## 2019-04-12 ENCOUNTER — Encounter: Payer: Self-pay | Admitting: Emergency Medicine

## 2019-04-12 ENCOUNTER — Inpatient Hospital Stay
Admission: EM | Admit: 2019-04-12 | Discharge: 2019-04-22 | DRG: 871 | Disposition: A | Discharge status: Hospice | Payer: Medicare HMO | Attending: Internal Medicine | Admitting: Internal Medicine

## 2019-04-12 ENCOUNTER — Other Ambulatory Visit: Payer: Self-pay

## 2019-04-12 ENCOUNTER — Emergency Department: Payer: Medicare HMO

## 2019-04-12 DIAGNOSIS — R627 Adult failure to thrive: Secondary | ICD-10-CM | POA: Diagnosis not present

## 2019-04-12 DIAGNOSIS — Z833 Family history of diabetes mellitus: Secondary | ICD-10-CM

## 2019-04-12 DIAGNOSIS — A419 Sepsis, unspecified organism: Secondary | ICD-10-CM | POA: Diagnosis not present

## 2019-04-12 DIAGNOSIS — E1122 Type 2 diabetes mellitus with diabetic chronic kidney disease: Secondary | ICD-10-CM | POA: Diagnosis present

## 2019-04-12 DIAGNOSIS — E872 Acidosis, unspecified: Secondary | ICD-10-CM | POA: Diagnosis present

## 2019-04-12 DIAGNOSIS — J690 Pneumonitis due to inhalation of food and vomit: Secondary | ICD-10-CM | POA: Diagnosis not present

## 2019-04-12 DIAGNOSIS — L03116 Cellulitis of left lower limb: Secondary | ICD-10-CM

## 2019-04-12 DIAGNOSIS — I4891 Unspecified atrial fibrillation: Secondary | ICD-10-CM | POA: Diagnosis present

## 2019-04-12 DIAGNOSIS — N1831 Chronic kidney disease, stage 3a: Secondary | ICD-10-CM | POA: Diagnosis present

## 2019-04-12 DIAGNOSIS — S81802A Unspecified open wound, left lower leg, initial encounter: Secondary | ICD-10-CM | POA: Diagnosis present

## 2019-04-12 DIAGNOSIS — N136 Pyonephrosis: Secondary | ICD-10-CM | POA: Diagnosis present

## 2019-04-12 DIAGNOSIS — E7800 Pure hypercholesterolemia, unspecified: Secondary | ICD-10-CM | POA: Diagnosis present

## 2019-04-12 DIAGNOSIS — M1990 Unspecified osteoarthritis, unspecified site: Secondary | ICD-10-CM | POA: Diagnosis present

## 2019-04-12 DIAGNOSIS — G4700 Insomnia, unspecified: Secondary | ICD-10-CM | POA: Diagnosis present

## 2019-04-12 DIAGNOSIS — G92 Toxic encephalopathy: Secondary | ICD-10-CM | POA: Diagnosis not present

## 2019-04-12 DIAGNOSIS — Z20828 Contact with and (suspected) exposure to other viral communicable diseases: Secondary | ICD-10-CM | POA: Diagnosis present

## 2019-04-12 DIAGNOSIS — E43 Unspecified severe protein-calorie malnutrition: Secondary | ICD-10-CM | POA: Diagnosis present

## 2019-04-12 DIAGNOSIS — L0390 Cellulitis, unspecified: Secondary | ICD-10-CM | POA: Diagnosis not present

## 2019-04-12 DIAGNOSIS — Z87440 Personal history of urinary (tract) infections: Secondary | ICD-10-CM

## 2019-04-12 DIAGNOSIS — N179 Acute kidney failure, unspecified: Secondary | ICD-10-CM | POA: Diagnosis present

## 2019-04-12 DIAGNOSIS — J984 Other disorders of lung: Secondary | ICD-10-CM

## 2019-04-12 DIAGNOSIS — E860 Dehydration: Secondary | ICD-10-CM | POA: Diagnosis present

## 2019-04-12 DIAGNOSIS — Z515 Encounter for palliative care: Secondary | ICD-10-CM | POA: Diagnosis not present

## 2019-04-12 DIAGNOSIS — J9601 Acute respiratory failure with hypoxia: Secondary | ICD-10-CM

## 2019-04-12 DIAGNOSIS — Z882 Allergy status to sulfonamides status: Secondary | ICD-10-CM

## 2019-04-12 DIAGNOSIS — L03119 Cellulitis of unspecified part of limb: Secondary | ICD-10-CM | POA: Diagnosis present

## 2019-04-12 DIAGNOSIS — Z66 Do not resuscitate: Secondary | ICD-10-CM | POA: Diagnosis not present

## 2019-04-12 DIAGNOSIS — R0602 Shortness of breath: Secondary | ICD-10-CM | POA: Diagnosis not present

## 2019-04-12 DIAGNOSIS — H9190 Unspecified hearing loss, unspecified ear: Secondary | ICD-10-CM | POA: Diagnosis present

## 2019-04-12 DIAGNOSIS — R6521 Severe sepsis with septic shock: Secondary | ICD-10-CM | POA: Diagnosis present

## 2019-04-12 DIAGNOSIS — R52 Pain, unspecified: Secondary | ICD-10-CM | POA: Diagnosis not present

## 2019-04-12 DIAGNOSIS — E785 Hyperlipidemia, unspecified: Secondary | ICD-10-CM | POA: Diagnosis present

## 2019-04-12 DIAGNOSIS — E1129 Type 2 diabetes mellitus with other diabetic kidney complication: Secondary | ICD-10-CM | POA: Diagnosis present

## 2019-04-12 DIAGNOSIS — E876 Hypokalemia: Secondary | ICD-10-CM | POA: Diagnosis not present

## 2019-04-12 DIAGNOSIS — Z7984 Long term (current) use of oral hypoglycemic drugs: Secondary | ICD-10-CM

## 2019-04-12 DIAGNOSIS — E1169 Type 2 diabetes mellitus with other specified complication: Secondary | ICD-10-CM

## 2019-04-12 DIAGNOSIS — F0390 Unspecified dementia without behavioral disturbance: Secondary | ICD-10-CM | POA: Diagnosis present

## 2019-04-12 DIAGNOSIS — G9340 Encephalopathy, unspecified: Secondary | ICD-10-CM | POA: Diagnosis not present

## 2019-04-12 DIAGNOSIS — G9341 Metabolic encephalopathy: Secondary | ICD-10-CM | POA: Diagnosis not present

## 2019-04-12 DIAGNOSIS — R6520 Severe sepsis without septic shock: Secondary | ICD-10-CM | POA: Diagnosis not present

## 2019-04-12 DIAGNOSIS — R1310 Dysphagia, unspecified: Secondary | ICD-10-CM | POA: Diagnosis not present

## 2019-04-12 DIAGNOSIS — N170 Acute kidney failure with tubular necrosis: Secondary | ICD-10-CM | POA: Diagnosis present

## 2019-04-12 DIAGNOSIS — I129 Hypertensive chronic kidney disease with stage 1 through stage 4 chronic kidney disease, or unspecified chronic kidney disease: Secondary | ICD-10-CM | POA: Diagnosis present

## 2019-04-12 DIAGNOSIS — D631 Anemia in chronic kidney disease: Secondary | ICD-10-CM | POA: Diagnosis present

## 2019-04-12 DIAGNOSIS — Z7189 Other specified counseling: Secondary | ICD-10-CM | POA: Diagnosis not present

## 2019-04-12 DIAGNOSIS — Z6825 Body mass index (BMI) 25.0-25.9, adult: Secondary | ICD-10-CM

## 2019-04-12 DIAGNOSIS — N390 Urinary tract infection, site not specified: Secondary | ICD-10-CM | POA: Diagnosis not present

## 2019-04-12 DIAGNOSIS — Z792 Long term (current) use of antibiotics: Secondary | ICD-10-CM

## 2019-04-12 LAB — CBC WITH DIFFERENTIAL/PLATELET
Abs Immature Granulocytes: 0.49 10*3/uL — ABNORMAL HIGH (ref 0.00–0.07)
Basophils Absolute: 0 10*3/uL (ref 0.0–0.1)
Basophils Relative: 0 %
Eosinophils Absolute: 0 10*3/uL (ref 0.0–0.5)
Eosinophils Relative: 0 %
HCT: 41.8 % (ref 36.0–46.0)
Hemoglobin: 12.8 g/dL (ref 12.0–15.0)
Immature Granulocytes: 3 %
Lymphocytes Relative: 13 %
Lymphs Abs: 2.1 10*3/uL (ref 0.7–4.0)
MCH: 34.7 pg — ABNORMAL HIGH (ref 26.0–34.0)
MCHC: 30.6 g/dL (ref 30.0–36.0)
MCV: 113.3 fL — ABNORMAL HIGH (ref 80.0–100.0)
Monocytes Absolute: 0.4 10*3/uL (ref 0.1–1.0)
Monocytes Relative: 3 %
Neutro Abs: 13.1 10*3/uL — ABNORMAL HIGH (ref 1.7–7.7)
Neutrophils Relative %: 81 %
Platelets: 292 10*3/uL (ref 150–400)
RBC: 3.69 MIL/uL — ABNORMAL LOW (ref 3.87–5.11)
RDW: 14.6 % (ref 11.5–15.5)
WBC: 16.1 10*3/uL — ABNORMAL HIGH (ref 4.0–10.5)
nRBC: 0.5 % — ABNORMAL HIGH (ref 0.0–0.2)

## 2019-04-12 LAB — COMPREHENSIVE METABOLIC PANEL
ALT: 38 U/L (ref 0–44)
AST: 31 U/L (ref 15–41)
Albumin: 3.4 g/dL — ABNORMAL LOW (ref 3.5–5.0)
Alkaline Phosphatase: 92 U/L (ref 38–126)
BUN: 52 mg/dL — ABNORMAL HIGH (ref 8–23)
CO2: <7 mmol/L — ABNORMAL LOW (ref 22–32)
Calcium: 8.3 mg/dL — ABNORMAL LOW (ref 8.9–10.3)
Chloride: 107 mmol/L (ref 98–111)
Creatinine, Ser: 3.48 mg/dL — ABNORMAL HIGH (ref 0.44–1.00)
GFR calc Af Amer: 14 mL/min — ABNORMAL LOW (ref >60)
GFR calc non Af Amer: 12 mL/min — ABNORMAL LOW (ref >60)
Glucose, Bld: 216 mg/dL — ABNORMAL HIGH (ref 70–99)
Potassium: 4.6 mmol/L (ref 3.5–5.1)
Sodium: 140 mmol/L (ref 135–145)
Total Bilirubin: 1 mg/dL (ref 0.3–1.2)
Total Protein: 6.1 g/dL — ABNORMAL LOW (ref 6.5–8.1)

## 2019-04-12 LAB — GLUCOSE, CAPILLARY: Glucose-Capillary: 149 mg/dL — ABNORMAL HIGH (ref 70–99)

## 2019-04-12 LAB — LACTIC ACID, PLASMA
Lactic Acid, Venous: 4.2 mmol/L (ref 0.5–1.9)
Lactic Acid, Venous: 3.6 mmol/L (ref 0.5–1.9)

## 2019-04-12 LAB — LIPASE, BLOOD: Lipase: 39 U/L (ref 11–51)

## 2019-04-12 LAB — PROTIME-INR
INR: 1.1 (ref 0.8–1.2)
Prothrombin Time: 13.8 seconds (ref 11.4–15.2)

## 2019-04-12 MED ORDER — DILTIAZEM HCL 25 MG/5ML IV SOLN
10.0000 mg | Freq: Once | INTRAVENOUS | Status: AC
  Administered 2019-04-12: 10 mg via INTRAVENOUS
  Filled 2019-04-12: qty 5

## 2019-04-12 MED ORDER — HEPARIN SODIUM (PORCINE) 5000 UNIT/ML IJ SOLN: 5000.0000 [IU] | Freq: Three times a day (TID) | INTRAMUSCULAR | Status: DC

## 2019-04-12 MED ORDER — SODIUM CHLORIDE 0.9 % IV BOLUS
500.0000 mL | Freq: Once | INTRAVENOUS | Status: AC
  Administered 2019-04-12: 500 mL via INTRAVENOUS

## 2019-04-12 MED ORDER — ENOXAPARIN SODIUM 40 MG/0.4ML ~~LOC~~ SOLN: 40.0000 mg | SUBCUTANEOUS | Status: DC

## 2019-04-12 MED ORDER — ONDANSETRON HCL 4 MG/2ML IJ SOLN
4.0000 mg | Freq: Once | INTRAMUSCULAR | Status: AC
  Administered 2019-04-12: 4 mg via INTRAVENOUS
  Filled 2019-04-12: qty 2

## 2019-04-12 MED ORDER — PIPERACILLIN-TAZOBACTAM 3.375 G IVPB 30 MIN
3.3750 g | Freq: Once | INTRAVENOUS | Status: AC
  Administered 2019-04-12: 3.375 g via INTRAVENOUS
  Filled 2019-04-12: qty 50

## 2019-04-12 MED ORDER — VANCOMYCIN HCL 1.25 G IV SOLR
1250.0000 mg | Freq: Once | INTRAVENOUS | Status: AC
  Administered 2019-04-12: 23:00:00 1250 mg via INTRAVENOUS
  Filled 2019-04-12: qty 1250

## 2019-04-12 MED ORDER — DILTIAZEM HCL 100 MG IV SOLR
4.0000 mg/h | INTRAVENOUS | Status: DC
  Administered 2019-04-12: 4 mg/h via INTRAVENOUS

## 2019-04-12 NOTE — ED Triage Notes (Signed)
Pt d/c a few weeks ago from admitted for uti - per family has not been able to eat or drink and refused to let them change the bandage on her left leg. Wrapping and dressing removed with aide of ns and wound noted to have drainage.

## 2019-04-12 NOTE — ED Provider Notes (Signed)
[facility] Emergency Department Provider Note   ____________________________________________   First MD Initiated Contact with Patient 04/12/19 2106     (approximate)  I have reviewed the triage vital signs and the nursing notes.   HISTORY  Chief Complaint Nausea and Wound Infection  EM caveat: Severe nausea  HPI Tiffany Manning is a 78 y.o. female who per the patient as well as her husband has been feeling sick once again for several days.  She has been having no eating, nauseated dry heaving.  Feeling very weak fatigued and her husband reports this continues to look and seems just like when she came to the hospital a few weeks ago.  She is not had any wound care to her left lower leg since leaving the hospital.  She is not having chest pain or shortness of breath reports just feeling extremely nauseated and generally sick.  Past Medical History:  Diagnosis Date  . A-fib ([place])   . Arthritis   . Diabetes mellitus without complication ([place])   . History of kidney stones   . Hyperlipidemia   . Hypertension   . PONV (postoperative nausea and vomiting)   . Sepsis [facility])     Patient Active Problem List   Diagnosis Date Noted  . Malnutrition of moderate degree 03/27/2019  . Metabolic acidosis   . Hydronephrosis   . ARF (acute renal failure) ([place]) 03/24/2019  . Lower urinary tract infectious disease 03/24/2019  . DM (diabetes mellitus), type 2 with renal complications ([place]) 27/25/3664  . AKI (acute kidney injury) ([place])   . Atrial fibrillation with rapid ventricular response ([place])   . Severe sepsis ([place]) 01/27/2018  . Pressure injury of skin 01/27/2018  . Diabetic acidosis without coma ([place])   . Leg wound, left 07/10/2017  . Diabetes ([place]) 06/18/2017  . Hypercholesteremia 06/18/2017  . Hypertension 06/18/2017  . Neck abscess 06/18/2017  . Protein-calorie malnutrition, severe 05/29/2017  . Acute renal failure ([place]) 05/28/2017  . Acute renal  failure (ARF) ([place]) 05/28/2017  . Sialolithiasis of submandibular gland 11/20/2013    Past Surgical History:  Procedure Laterality Date  . ABDOMINAL HYSTERECTOMY    . BREAST SURGERY Left    lumpectomy  . CATARACT EXTRACTION W/ INTRAOCULAR LENS  IMPLANT, BILATERAL    . CHOLECYSTECTOMY    . CYSTOGRAM N/A 03/15/2018   Procedure: CYSTOGRAM;  Surgeon: Billey Co, MD;  Location: [name] ORS;  Service: Urology;  Laterality: N/A;  . CYSTOSCOPY W/ RETROGRADES Bilateral 03/15/2018   Procedure: CYSTOSCOPY WITH RETROGRADE PYELOGRAM;  Surgeon: Billey Co, MD;  Location: [name] ORS;  Service: Urology;  Laterality: Bilateral;  . CYSTOSCOPY W/ URETERAL STENT REMOVAL Bilateral 03/15/2018   Procedure: CYSTOSCOPY WITH STENT REMOVAL;  Surgeon: Billey Co, MD;  Location: [name] ORS;  Service: Urology;  Laterality: Bilateral;  . CYSTOSCOPY WITH BIOPSY N/A 03/15/2018   Procedure: CYSTOSCOPY WITH Bladder BIOPSY;  Surgeon: Billey Co, MD;  Location: [name] ORS;  Service: Urology;  Laterality: N/A;  . CYSTOSCOPY WITH FULGERATION N/A 03/15/2018   Procedure: CYSTOSCOPY WITH FULGERATION;  Surgeon: Billey Co, MD;  Location: [name] ORS;  Service: Urology;  Laterality: N/A;  . CYSTOSCOPY WITH STENT PLACEMENT Bilateral 01/28/2018   Procedure: CYSTOSCOPY WITH STENT PLACEMENT;  Surgeon: Irine Seal, MD;  Location: [name] ORS;  Service: Urology;  Laterality: Bilateral;  . CYSTOSCOPY WITH URETEROSCOPY Bilateral 03/15/2018   Procedure: CYSTOSCOPY WITH URETEROSCOPY;  Surgeon: Billey Co, MD;  Location: [name] ORS;  Service: Urology;  Laterality: Bilateral;  . SALIVARY STONE REMOVAL    . TONSILLECTOMY      Prior to Admission medications   Medication Sig Start Date End Date Taking? Authorizing Provider  acetaminophen (TYLENOL) 325 MG tablet Take 2 tablets (650 mg total) by mouth every 6 (six) hours as needed for mild pain (or Fever >/= 101). 02/06/18   Alford [place], MD  acidophilus (RISAQUAD) CAPS capsule  Take 1 capsule by mouth daily. 03/29/19   Esaw Grandchild A, DO  Amino Acids-Protein Hydrolys (FEEDING SUPPLEMENT, PRO-STAT SUGAR FREE 64,) LIQD Take 30 mLs by mouth 2 (two) times daily. 03/28/19   Pennie Banter, DO  glipiZIDE (GLUCOTROL) 5 MG tablet Take 0.5 tablets (2.5 mg total) by mouth daily before breakfast. 02/06/18 03/24/19  Alford Highland, MD  Metoprolol Tartrate 75 MG TABS Take 75 mg by mouth 2 (two) times daily. 02/06/18   Alford [place]land, MD  Multiple Vitamin (MULTIVITAMIN WITH MINERALS) TABS tablet Take 1 tablet by mouth daily. 03/29/19   Pennie Banter, DO  ranitidine (ZANTAC) 150 MG capsule Take 150 mg by mouth 2 (two) times daily as needed for heartburn.    [provider]  simvastatin (ZOCOR) 40 MG tablet Take 40 mg by mouth daily.  04/01/18   [provider]  tamsulosin (FLOMAX) 0.4 MG CAPS capsule Take 1 capsule (0.4 mg total) by mouth daily. 12/26/18   Sondra Come, MD  triamterene-hydrochlorothiazide (DYAZIDE) 37.5-25 MG capsule Take 1 capsule by mouth 2 (two) times daily.     [provider]  trimethoprim (TRIMPEX) 100 MG tablet Take 1 tablet (100 mg total) by mouth daily. 12/26/18   Sondra Come, MD  vitamin C (VITAMIN C) 250 MG tablet Take 1 tablet (250 mg total) by mouth 2 (two) times daily. 03/28/19   Pennie Banter, DO    Allergies Sulfa antibiotics  Family History  Problem Relation Age of Onset  . Diabetes Sister     Social History Social History   Tobacco Use  . Smoking status: Never Smoker  . Smokeless tobacco: Never Used  Substance Use Topics  . Alcohol use: No    Frequency: Never  . Drug use: No    Review of Systems Constitutional: No fever/chills Eyes: No visual changes. ENT: No sore throat. Cardiovascular: Denies chest pain. Respiratory: Denies shortness of breath. Gastrointestinal: No abdominal pain.   Genitourinary: Negative for dysuria. Musculoskeletal: Negative for back pain. Skin: Negative for  rash. Neurological: Negative for headaches, areas of focal weakness or numbness.    ____________________________________________   PHYSICAL EXAM:  VITAL SIGNS: ED Triage Vitals  Enc Vitals Group     BP 04/12/19 2125 105/63     Pulse Rate 04/12/19 2125 (!) 144     Resp 04/12/19 2125 (!) 27     Temp 04/12/19 2125 97.8 F (36.6 C)     Temp Source 04/12/19 2125 Axillary     SpO2 04/12/19 2125 98 %     Weight 04/12/19 2130 130 lb 1.1 oz (59 kg)     Height 04/12/19 2130 5' (1.524 m)     Head Circumference --      Peak Flow --      Pain Score --      Pain Loc --      Pain Edu? --      Excl. in GC? --     Constitutional: Alert and oriented.  Moderate severely ill-appearing.  Alert and oriented but appears extremely nauseated, dry fatigue tachycardic  pale slight duskiness give her periphery including toes and fingers with slow capillary refill in extremities  eyes: Conjunctivae are normal. Head: Atraumatic. Nose: No congestion/rhinnorhea. Mouth/Throat: Mucous membranes are extremely dry. Neck: No stridor.  Cardiovascular: Tachycardic and irregular grossly normal heart sounds.  Good peripheral circulation. Respiratory: Normal respiratory effort.  No retractions. Lungs CTAB. Gastrointestinal: Soft and nontender. No distention.  Reports feels very nauseated when abdomen is palpated. Musculoskeletal: No lower extremity tenderness but does have erythema as well as venous stasis as well as an open skin wound over the posterior left calf without crepitance or significant induration.  It does appear like [place] superinfected Neurologic:  Normal speech and language. No gross focal neurologic deficits are appreciated.  Skin:  Skin is warm, dry and intact. No rash noted. Psychiatric: Mood and affect are normal. Speech and behavior are normal.  ____________________________________________   LABS (all labs ordered are listed, but only abnormal results are displayed)  Labs Reviewed  LACTIC  ACID, PLASMA - Abnormal; Notable for the following components:      Result Value   Lactic Acid, Venous 4.2 (*)    All other components within normal limits  COMPREHENSIVE METABOLIC PANEL - Abnormal; Notable for the following components:   CO2 <7 (*)    Glucose, Bld 216 (*)    BUN 52 (*)    Creatinine, Ser 3.48 (*)    Calcium 8.3 (*)    Total Protein 6.1 (*)    Albumin 3.4 (*)    GFR calc non Af Amer 12 (*)    GFR calc Af Amer 14 (*)    All other components within normal limits  CBC WITH DIFFERENTIAL/PLATELET - Abnormal; Notable for the following components:   WBC 16.1 (*)    RBC 3.69 (*)    MCV 113.3 (*)    MCH 34.7 (*)    nRBC 0.5 (*)    Neutro Abs 13.1 (*)    Abs Immature Granulocytes 0.49 (*)    All other components within normal limits  CULTURE, BLOOD (ROUTINE X 2)  CULTURE, BLOOD (ROUTINE X 2)  SARS CORONAVIRUS 2 (TAT 6-24 HRS)  LIPASE, BLOOD  PROTIME-INR  URINALYSIS, ROUTINE W REFLEX MICROSCOPIC  LACTIC ACID, PLASMA  LACTIC ACID, PLASMA   ____________________________________________  EKG  Reviewed inter by me at 2139 heart rate 159 QRS 100 QTc 469 atrial fibrillation with RVR rapid ventricular response.  Ischemic changes are noted in inferolateral distribution.  No ST elevation MI ____________________________________________  RADIOLOGY  Dg Abdomen Acute W/chest  Result Date: 04/12/2019 CLINICAL DATA:  Nausea vomiting EXAM: DG ABDOMEN ACUTE W/ 1V CHEST COMPARISON:  None. FINDINGS: There is no evidence of dilated bowel loops or free intraperitoneal air. No radiopaque calculi or other significant radiographic abnormality is seen. There is mild cardiomegaly. No large airspace consolidation. Minimal nonspecific patchy airspace opacity seen within the right mid lung. IMPRESSION: Negative abdominal radiographs. Minimal patchy airspace opacity seen within the right mid lung, which is non-specific could be due scarring and/or atelectasis. Electronically Signed   By: Jonna Clark M.D.   On: 04/12/2019 21:44    Imaging studies reviewed, no acute abdominal abnormalities.  Possible patchy airspace disease in the right midlung ____________________________________________   PROCEDURES  Procedure(s) performed: None  Procedures  Critical Care performed: Yes, see critical care note(s)  CRITICAL CARE Performed by: Sharyn Creamer   Total critical care time: 40 minutes  Critical care time was exclusive of separately billable procedures and treating other patients.  Critical  care was necessary to treat or prevent imminent or life-threatening deterioration.  Critical care was time spent personally by me on the following activities: development of treatment plan with patient and/or surrogate as well as nursing, discussions with consultants, evaluation of patient's response to treatment, examination of patient, obtaining history from patient or surrogate, ordering and performing treatments and interventions, ordering and review of laboratory studies, ordering and review of radiographic studies, pulse oximetry and re-evaluation of patient's condition.  ____________________________________________   INITIAL IMPRESSION / ASSESSMENT AND PLAN / ED COURSE  Pertinent labs & imaging results that were available during my care of the patient were reviewed by me and considered in my medical decision making (see chart for details).   Patient presents with evidence of critical illness, some peripheral nailbed ischemia and cyanosis, she appears critically ill with severe intractable nausea vomiting.  A. fib RVR, labs notable also for acute kidney injury, severely low bicarbonate, elevated anion gap metabolic acidosis.  Also concern for wound infection left posterior leg with some cellulitis.  Start broad-spectrum antibiotic like Zosyn.  Code sepsis initiated.  Initially responded fairly well to diltiazem infusion, will add diltiazem drip for rate control can trial and watch her blood  pressure very carefully as we continue to hydrate her she appears diffusely volume down as well.  Clinical Course as of Apr 12 2247  Sat Apr 12, 2019  2229 Patient continues to have ongoing nausea, heart rate has now improved to 110.  Blood pressure improved to 110/55 systolic, overall improving.  Ongoing acute kidney injury, admit to hospital   [MQ]    Clinical Course User Index [MQ] Sharyn CreamerQuale, Mark, MD    Admission discussed hospitalist Dr. Cyndia Bentu ____________________________________________   FINAL CLINICAL IMPRESSION(S) / ED DIAGNOSES  Final diagnoses:  Cellulitis of left lower extremity  Sepsis, due to unspecified organism, unspecified whether acute organ dysfunction present [facility](HCC)  Severe sepsis [facility](HCC)  Atrial fibrillation with RVR (HCC)        Note:  This document was prepared using Dragon voice recognition software and may include unintentional dictation errors       Sharyn CreamerQuale, Mark, MD 04/12/19 2248

## 2019-04-12 NOTE — ED Notes (Signed)
Pt with pox of 90% on ra, placed on 4lpm per md with [place] with rebound pox to 94%. Pt with pale nail beds, md at bedside.

## 2019-04-12 NOTE — ED Notes (Signed)
ED TO INPATIENT HANDOFF REPORT  ED Nurse Name and Phone #: [phone]   S Name/Age/Gender Tiffany Manning 78 y.o. female Room/Bed: ED17A/ED17A  Code Status   Code Status: Prior  Home/SNF/Other Home Patient oriented to: self Is this baseline? No   Triage Complete: Triage complete  Chief Complaint N/V/Diarrhea  Triage Note Pt d/c a few weeks ago from admitted for uti - per family has not been able to eat or drink and refused to let them change the bandage on her left leg. Wrapping and dressing removed with aide of ns and wound noted to have drainage.   Allergies Allergies  Allergen Reactions  . Sulfa Antibiotics Nausea And Vomiting    Level of Care/Admitting Diagnosis ED Disposition    ED Disposition Condition Comment   Admit  The patient appears reasonably stabilized for admission considering the current resources, flow, and capabilities available in the ED at this time, and I doubt any other [facility] requiring further screening and/or treatment in the ED prior to admission is  present.       B Medical/Surgery History Past Medical History:  Diagnosis Date  . A-fib ([place])   . Arthritis   . Diabetes mellitus without complication ([place])   . History of kidney stones   . Hyperlipidemia   . Hypertension   . PONV (postoperative nausea and vomiting)   . Sepsis [facility])    Past Surgical History:  Procedure Laterality Date  . ABDOMINAL HYSTERECTOMY    . BREAST SURGERY Left    lumpectomy  . CATARACT EXTRACTION W/ INTRAOCULAR LENS  IMPLANT, BILATERAL    . CHOLECYSTECTOMY    . CYSTOGRAM N/A 03/15/2018   Procedure: CYSTOGRAM;  Surgeon: Billey Co, MD;  Location: [name] ORS;  Service: Urology;  Laterality: N/A;  . CYSTOSCOPY W/ RETROGRADES Bilateral 03/15/2018   Procedure: CYSTOSCOPY WITH RETROGRADE PYELOGRAM;  Surgeon: Billey Co, MD;  Location: [name] ORS;  Service: Urology;  Laterality: Bilateral;  . CYSTOSCOPY W/ URETERAL STENT REMOVAL Bilateral 03/15/2018   Procedure: CYSTOSCOPY WITH STENT REMOVAL;  Surgeon: Billey Co, MD;  Location: [name] ORS;  Service: Urology;  Laterality: Bilateral;  . CYSTOSCOPY WITH BIOPSY N/A 03/15/2018   Procedure: CYSTOSCOPY WITH Bladder BIOPSY;  Surgeon: Billey Co, MD;  Location: [name] ORS;  Service: Urology;  Laterality: N/A;  . CYSTOSCOPY WITH FULGERATION N/A 03/15/2018   Procedure: CYSTOSCOPY WITH FULGERATION;  Surgeon: Billey Co, MD;  Location: [name] ORS;  Service: Urology;  Laterality: N/A;  . CYSTOSCOPY WITH STENT PLACEMENT Bilateral 01/28/2018   Procedure: CYSTOSCOPY WITH STENT PLACEMENT;  Surgeon: Irine Seal, MD;  Location: [name] ORS;  Service: Urology;  Laterality: Bilateral;  . CYSTOSCOPY WITH URETEROSCOPY Bilateral 03/15/2018   Procedure: CYSTOSCOPY WITH URETEROSCOPY;  Surgeon: Billey Co, MD;  Location: [name] ORS;  Service: Urology;  Laterality: Bilateral;  . SALIVARY STONE REMOVAL    . TONSILLECTOMY       A IV Location/Drains/Wounds Patient Lines/Drains/Airways Status   Active Line/Drains/Airways    Name:   Placement date:   Placement time:   Site:   Days:   Peripheral IV 04/12/19 Right Forearm   04/12/19    2209    Forearm   less than 1   Peripheral IV 04/12/19 Left Antecubital   04/12/19    2210    Antecubital   less than 1   External Urinary Catheter   04/12/19    2242    -   less than 1   Pressure  Injury 01/27/18 Stage I -  Intact skin with non-blanchable redness of a localized area usually over a bony prominence.   01/27/18    1115     440   Wound / Incision (Open or Dehisced) 03/26/19 Other (Comment) Leg Left;Lateral   03/26/19    -    Leg   17          Intake/Output Last 24 hours  Intake/Output Summary (Last 24 hours) at 04/12/2019 2254 Last data filed at 04/12/2019 2251 Gross per 24 hour  Intake 550 ml  Output -  Net 550 ml    Labs/Imaging Results for orders placed or performed during the hospital encounter of 04/12/19 (from the past 48 hour(s))  Lactic acid, plasma      Status: Abnormal   Collection Time: 04/12/19  9:37 PM  Result Value Ref Range   Lactic Acid, Venous 4.2 (HH) 0.5 - 1.9 mmol/L    Comment: CRITICAL RESULT CALLED TO, READ BACK BY AND VERIFIED WITH SUSAN NEAL 04/12/19 @ 2207  MLK Performed at [facility], [address] [place], [place] [address]   Comprehensive metabolic panel     Status: Abnormal   Collection Time: 04/12/19  9:37 PM  Result Value Ref Range   Sodium 140 135 - 145 mmol/L   Potassium 4.6 3.5 - 5.1 mmol/L   Chloride 107 98 - 111 mmol/L   CO2 <7 (L) 22 - 32 mmol/L   Glucose, Bld 216 (H) 70 - 99 mg/dL   BUN 52 (H) 8 - 23 mg/dL   Creatinine, Ser 6.04 (H) 0.44 - 1.00 mg/dL   Calcium 8.3 (L) 8.9 - 10.3 mg/dL   Total Protein 6.1 (L) 6.5 - 8.1 g/dL   Albumin 3.4 (L) 3.5 - 5.0 g/dL   AST 31 15 - 41 U/L   ALT 38 0 - 44 U/L   Alkaline Phosphatase 92 38 - 126 U/L   Total Bilirubin 1.0 0.3 - 1.2 mg/dL   GFR calc non Af Amer 12 (L) >60 mL/min   GFR calc Af Amer 14 (L) >60 mL/min   Anion gap NOT CALCULATED 5 - 15    Comment: Performed at [facility], [address] [place], [place] [address]  Lipase, blood     Status: None   Collection Time: 04/12/19  9:37 PM  Result Value Ref Range   Lipase 39 11 - 51 U/L    Comment: Performed at [facility], [address] [place], [place] [address]  CBC WITH DIFFERENTIAL     Status: Abnormal   Collection Time: 04/12/19  9:37 PM  Result Value Ref Range   WBC 16.1 (H) 4.0 - 10.5 K/uL   RBC 3.69 (L) 3.87 - 5.11 MIL/uL   Hemoglobin 12.8 12.0 - 15.0 g/dL   HCT 78.2 95.6 - 21.3 %   MCV 113.3 (H) 80.0 - 100.0 fL   MCH 34.7 (H) 26.0 - 34.0 pg   MCHC 30.6 30.0 - 36.0 g/dL   RDW 08.6 57.8 - 46.9 %   Platelets 292 150 - 400 K/uL   nRBC 0.5 (H) 0.0 - 0.2 %   Neutrophils Relative % 81 %   Neutro Abs 13.1 (H) 1.7 - 7.7 K/uL   Lymphocytes Relative 13 %   Lymphs Abs 2.1 0.7 - 4.0 K/uL   Monocytes Relative 3 %   Monocytes Absolute 0.4 0.1 - 1.0 K/uL    Eosinophils Relative 0 %   Eosinophils Absolute 0.0 0.0 -  0.5 K/uL   Basophils Relative 0 %   Basophils Absolute 0.0 0.0 - 0.1 K/uL   Immature Granulocytes 3 %   Abs Immature Granulocytes 0.49 (H) 0.00 - 0.07 K/uL    Comment: Performed at [facility], [address] [place], [place] [address]  Protime-INR     Status: None   Collection Time: 04/12/19  9:37 PM  Result Value Ref Range   Prothrombin Time 13.8 11.4 - 15.2 seconds   INR 1.1 0.8 - 1.2    Comment: (NOTE) INR goal varies based on device and disease states. Performed at [facility], [address] [place], [place] [address]    Dg Abdomen Acute W/chest  Result Date: 04/12/2019 CLINICAL DATA:  Nausea vomiting EXAM: DG ABDOMEN ACUTE W/ 1V CHEST COMPARISON:  None. FINDINGS: There is no evidence of dilated bowel loops or free intraperitoneal air. No radiopaque calculi or other significant radiographic abnormality is seen. There is mild cardiomegaly. No large airspace consolidation. Minimal nonspecific patchy airspace opacity seen within the right mid lung. IMPRESSION: Negative abdominal radiographs. Minimal patchy airspace opacity seen within the right mid lung, which is non-specific could be due scarring and/or atelectasis. Electronically Signed   By: Jonna ClarkBindu  Avutu M.D.   On: 04/12/2019 21:44    Pending Labs Unresulted Labs (From admission, onward)    Start     Ordered   04/12/19 2330  Lactic acid, plasma  Once,   STAT     04/12/19 2330   04/12/19 2330  Lactic acid, plasma  Once-Timed,   STAT     04/12/19 2239   04/12/19 2231  SARS CORONAVIRUS 2 (TAT 6-24 HRS) Nasopharyngeal Nasopharyngeal Swab  (Asymptomatic/Tier 3)  Once,   STAT    Question Answer Comment  Is this test for diagnosis or screening Diagnosis of ill patient   Symptomatic for COVID-19 as defined by CDC Yes   Date of Symptom Onset 04/08/2019   Hospitalized for COVID-19 Unknown   Admitted to ICU for COVID-19 No   Previously tested for COVID-19  Yes   Resident in a congregate (group) care setting No   Employed in healthcare setting No   Pregnant No      04/12/19 2230   04/12/19 2116  Blood Culture (routine x 2)  BLOOD CULTURE X 2,   STAT     04/12/19 2117   04/12/19 2116  Urinalysis, Routine w reflex microscopic  ONCE - STAT,   STAT     04/12/19 2117          Vitals/Pain Today's Vitals   04/12/19 2200 04/12/19 2230 04/12/19 2245 04/12/19 2250  BP: 101/60 102/60 100/65   Pulse: (!) 54     Resp: (!) 34 (!) 29 (!) 29 (!) 24  Temp:      TempSrc:      SpO2: 90%   93%  Weight:      Height:        Isolation Precautions No active isolations  Medications Medications  sodium chloride 0.9 % bolus 500 mL (has no administration in time range)  diltiazem (CARDIZEM) 100 mg in dextrose 5 % 100 mL (1 mg/mL) infusion (has no administration in time range)  sodium chloride 0.9 % bolus 500 mL (has no administration in time range)  ondansetron (ZOFRAN) injection 4 mg (4 mg Intravenous Given 04/12/19 2157)  sodium chloride 0.9 % bolus 500 mL (0 mLs Intravenous Stopped 04/12/19 2251)  diltiazem (CARDIZEM) injection 10 mg (10 mg Intravenous Given 04/12/19  2209)  piperacillin-tazobactam (ZOSYN) IVPB 3.375 g (0 g Intravenous Stopped 04/12/19 2241)    Mobility non-ambulatory Moderate fall risk   Focused Assessments Cardiac Assessment Handoff:  Cardiac Rhythm: Atrial fibrillation Lab Results  Component Value Date   CKTOTAL 175 01/29/2018   No results found for: DDIMER Does the Patient currently have chest pain? unknown    R  Recommendations: See Admitting Provider Note  Report given to:   Additional Notes:

## 2019-04-12 NOTE — H&P (Addendum)
History and Physical    Tiffany Manning:[id number] DOB: [birth year] DOA: 04/12/2019  PCP: Tiffany Shaggy, Manning  Patient coming from: Home , husband at bedside  I have personally briefly reviewed patient's old medical records in [facility]  Chief Complaint: nausea, vomiting, weakness  HPI: Tiffany Manning is a 78 y.o. female with medical history significant of hypertension, hyperlipidemia, recurrent urinary tract infections on chronic suppressive antibiotic, diabetes mellitus, A. fib not on anticoagulation due to hx of hematuria who presented with persistent nausea, vomiting, diarrhea/loose stool and decrease PO intake. Also has been fatigued and weak. Today Husband could no longer help her given her weakness and decided to come to ED. Husband gives history since she is lethargic.  She was recently hospitalized from 11/16-11/20 where she presented with similar symptoms of nausea, vomiting and decrease appetite. She was found to be in Atrial fibrillation with RVR which resolved with diltiazem bolus and home rate controlled medication. ID was consulted for bacteruria and recommended no antibiotics or C.diff testing since she was afebrile and without leukocytosis. Her weakness was not able to be access since she refused to work with nursing or PT during last admission. Husband felt that she was not back to her baseline but took her home since patient insisted.  Since returning home she continued to have symptoms of nausea, vomiting and intermittent diarrhea with worsening progressively weakness.  ED Course: She was afebrile but tachypnic in atrial fibrillation with RVR with rates initially up to 160. and and had borderline blood pressure of 100/60.  Her heart rate ranged from about 110-120s after IV diltiazem bolus in the ED.  Patient was ill-appearing with cold and dusky extremities.  She was lethargic and required multiple voice commands to wake up.   CBC shows leukocytosis of 16.1, stable  hemoglobin of 12.8. Sodium of 140. Potassium of 4.6. CO2 of less than 7. Glucose of 216. BUN of 52, creatinine of 3.48 elevated from 1.05 two weeks ago.  Lactate of 4.2. Anion gap was incalculable.  Review of Systems: Unable to obtain given patient's lethargy  Past Medical History:  Diagnosis Date  . A-fib ([facility])   . Arthritis   . Diabetes mellitus without complication ([facility])   . History of kidney stones   . Hyperlipidemia   . Hypertension   . PONV (postoperative nausea and vomiting)   . Sepsis [facility])     Past Surgical History:  Procedure Laterality Date  . ABDOMINAL HYSTERECTOMY    . BREAST SURGERY Left    lumpectomy  . CATARACT EXTRACTION W/ INTRAOCULAR LENS  IMPLANT, BILATERAL    . CHOLECYSTECTOMY    . CYSTOGRAM N/A 03/15/2018   Procedure: CYSTOGRAM;  Surgeon: Tiffany Manning;  Location: [name] ORS;  Service: Urology;  Laterality: N/A;  . CYSTOSCOPY W/ RETROGRADES Bilateral 03/15/2018   Procedure: CYSTOSCOPY WITH RETROGRADE PYELOGRAM;  Surgeon: Tiffany Manning;  Location: [name] ORS;  Service: Urology;  Laterality: Bilateral;  . CYSTOSCOPY W/ URETERAL STENT REMOVAL Bilateral 03/15/2018   Procedure: CYSTOSCOPY WITH STENT REMOVAL;  Surgeon: Tiffany Manning;  Location: [name] ORS;  Service: Urology;  Laterality: Bilateral;  . CYSTOSCOPY WITH BIOPSY N/A 03/15/2018   Procedure: CYSTOSCOPY WITH Bladder BIOPSY;  Surgeon: Tiffany Manning;  Location: [name] ORS;  Service: Urology;  Laterality: N/A;  . CYSTOSCOPY WITH FULGERATION N/A 03/15/2018   Procedure: CYSTOSCOPY WITH FULGERATION;  Surgeon: Tiffany Manning;  Location: [name] ORS;  Service: Urology;  Laterality: N/A;  .  CYSTOSCOPY WITH STENT PLACEMENT Bilateral 01/28/2018   Procedure: CYSTOSCOPY WITH STENT PLACEMENT;  Surgeon: Bjorn Manning, John, Manning;  Location: [name] ORS;  Service: Urology;  Laterality: Bilateral;  . CYSTOSCOPY WITH URETEROSCOPY Bilateral 03/15/2018   Procedure: CYSTOSCOPY WITH URETEROSCOPY;  Surgeon: Tiffany ComeSninsky, Tiffany C,  Manning;  Location: [name] ORS;  Service: Urology;  Laterality: Bilateral;  . SALIVARY STONE REMOVAL    . TONSILLECTOMY       reports that she has never smoked. She has never used smokeless tobacco. She reports that she does not drink alcohol or use drugs.  Allergies  Allergen Reactions  . Sulfa Antibiotics Nausea And Vomiting    Family History  Problem Relation Age of Onset  . Diabetes Sister      Prior to Admission medications   Medication Sig Start Date End Date Taking? Authorizing Provider  acetaminophen (TYLENOL) 325 MG tablet Take 2 tablets (650 mg total) by mouth every 6 (six) hours as needed for mild pain (or Fever >/= 101). 02/06/18  Yes Manning, Richard, Manning  acidophilus (RISAQUAD) CAPS capsule Take 1 capsule by mouth daily. 03/29/19  Yes Esaw GrandchildGriffith, Tiffany A, DO  Amino Acids-Protein Hydrolys (FEEDING SUPPLEMENT, PRO-STAT SUGAR FREE 64,) LIQD Take 30 mLs by mouth 2 (two) times daily. 03/28/19  Yes Tiffany BanterGriffith, Tiffany A, DO  glipiZIDE (GLUCOTROL) 5 MG tablet Take 0.5 tablets (2.5 mg total) by mouth daily before breakfast. 02/06/18 04/12/19 Yes Manning, Richard, Manning  metoprolol tartrate (LOPRESSOR) 50 MG tablet Take 75 mg by mouth 2 (two) times daily.  04/11/19  Yes Provider, Historical, Manning  Multiple Vitamin (MULTIVITAMIN WITH MINERALS) TABS tablet Take 1 tablet by mouth daily. 03/29/19  Yes Esaw GrandchildGriffith, Tiffany A, DO  ranitidine (ZANTAC) 150 MG capsule Take 150 mg by mouth 2 (two) times daily as needed for heartburn.   Yes Provider, Historical, Manning  simvastatin (ZOCOR) 40 MG tablet Take 40 mg by mouth daily.  04/01/18  Yes Provider, Historical, Manning  tamsulosin (FLOMAX) 0.4 MG CAPS capsule Take 1 capsule (0.4 mg total) by mouth daily. 12/26/18  Yes Tiffany ComeSninsky, Tiffany C, Manning  triamterene-hydrochlorothiazide (DYAZIDE) 37.5-25 MG capsule Take 1 capsule by mouth 2 (two) times daily.    Yes Provider, Historical, Manning  trimethoprim (TRIMPEX) 100 MG tablet Take 1 tablet (100 mg total) by mouth daily. 12/26/18  Yes  Tiffany ComeSninsky, Tiffany C, Manning  vitamin C (VITAMIN C) 250 MG tablet Take 1 tablet (250 mg total) by mouth 2 (two) times daily. 03/28/19  Yes Tiffany BanterGriffith, Tiffany A, DO    Physical Exam: Vitals:   04/12/19 2200 04/12/19 2230 04/12/19 2245 04/12/19 2250  BP: 101/60 102/60 100/65   Pulse: (!) 54     Resp: (!) 34 (!) 29 (!) 29 (!) 24  Temp:      TempSrc:      SpO2: 90%   93%  Weight:      Height:        Constitutional: ill-appearing female with eyes closed and deep respirations with use of abdominal muscles.  Patient was lethargic and took calling her name multiple times for her to turn her head towards voice and took more prompting for her to open her eyes. Vitals:   04/12/19 2200 04/12/19 2230 04/12/19 2245 04/12/19 2250  BP: 101/60 102/60 100/65   Pulse: (!) 54     Resp: (!) 34 (!) 29 (!) 29 (!) 24  Temp:      TempSrc:      SpO2: 90%   93%  Weight:  Height:       Eyes:  lids and conjunctivae normal ENMT: Mucous membranes are moist.  Neck: normal, supple, no masses Respiratory: clear to auscultation bilaterally, no wheezing, no crackles.deep respirations with use of abdominal muscles on room air.   Cardiovascular: Irregularly irregular rhythm with heart rate up to 120 on telemetry, no murmurs / rubs / gallops. No extremity edema.   Abdomen: Mild grimace with palpation of the left upper quadrant but otherwise difficult to assess for tenderness given her lethargy, bowel sounds positive.  Musculoskeletal: cyanosis noted to fingernails and toes.  Extremities were cool to touch and dusky.  No joint deformity upper and lower extremities.  Skin: Fleshy wound to left distal calf with surround erythema and increase warmth. No noted drainage.     Neurologic: Lethargic and difficult to arouse at times.  Patient able to give 1 word answers but would fall back asleep.  She was unable to follow simple commands due to lethargy. Psychiatric: Unable to assess due to altered mental status  Labs on  Admission: I have personally reviewed following labs and imaging studies  CBC: Recent Labs  Lab 04/12/19 2137  WBC 16.1*  NEUTROABS 13.1*  HGB 12.8  HCT 41.8  MCV 113.3*  PLT 292   Basic Metabolic Panel: Recent Labs  Lab 04/12/19 2137  NA 140  K 4.6  CL 107  CO2 <7*  GLUCOSE 216*  BUN 52*  CREATININE 3.48*  CALCIUM 8.3*   GFR: Estimated Creatinine Clearance: 10.5 mL/min (A) (by C-G formula based on SCr of 3.48 mg/dL (H)). Liver Function Tests: Recent Labs  Lab 04/12/19 2137  AST 31  ALT 38  ALKPHOS 92  BILITOT 1.0  PROT 6.1*  ALBUMIN 3.4*   Recent Labs  Lab 04/12/19 2137  LIPASE 39   No results for input(s): AMMONIA in the last 168 hours. Coagulation Profile: Recent Labs  Lab 04/12/19 2137  INR 1.1   Cardiac Enzymes: No results for input(s): CKTOTAL, CKMB, CKMBINDEX, TROPONINI in the last 168 hours. BNP (last 3 results) No results for input(s): PROBNP in the last 8760 hours. HbA1C: No results for input(s): HGBA1C in the last 72 hours. CBG: No results for input(s): GLUCAP in the last 168 hours. Lipid Profile: No results for input(s): CHOL, HDL, LDLCALC, TRIG, CHOLHDL, LDLDIRECT in the last 72 hours. Thyroid Function Tests: No results for input(s): TSH, T4TOTAL, FREET4, T3FREE, THYROIDAB in the last 72 hours. Anemia Panel: No results for input(s): VITAMINB12, FOLATE, FERRITIN, TIBC, IRON, RETICCTPCT in the last 72 hours. Urine analysis:    Component Value Date/Time   COLORURINE AMBER (A) 03/24/2019 1747   APPEARANCEUR TURBID (A) 03/24/2019 1747   APPEARANCEUR Cloudy 10/18/2011 1616   LABSPEC 1.020 03/24/2019 1747   LABSPEC 1.009 10/18/2011 1616   PHURINE 7.0 03/24/2019 1747   GLUCOSEU NEGATIVE 03/24/2019 1747   GLUCOSEU Negative 10/18/2011 1616   HGBUR SMALL (A) 03/24/2019 1747   BILIRUBINUR NEGATIVE 03/24/2019 1747   BILIRUBINUR Negative 10/18/2011 1616   KETONESUR 5 (A) 03/24/2019 1747   PROTEINUR 100 (A) 03/24/2019 1747   NITRITE  NEGATIVE 03/24/2019 1747   LEUKOCYTESUR LARGE (A) 03/24/2019 1747   LEUKOCYTESUR 2+ 10/18/2011 1616    Radiological Exams on Admission: Dg Abdomen Acute W/chest  Result Date: 04/12/2019 CLINICAL DATA:  Nausea vomiting EXAM: DG ABDOMEN ACUTE W/ 1V CHEST COMPARISON:  None. FINDINGS: There is no evidence of dilated bowel loops or free intraperitoneal air. No radiopaque calculi or other significant radiographic abnormality is seen. There  is mild cardiomegaly. No large airspace consolidation. Minimal nonspecific patchy airspace opacity seen within the right mid lung. IMPRESSION: Negative abdominal radiographs. Minimal patchy airspace opacity seen within the right mid lung, which is non-specific could be due scarring and/or atelectasis. Electronically Signed   By: Prudencio Pair M.D.   On: 04/12/2019 21:44    EKG: Independently reviewed.   Assessment/Plan  Sepsis secondary to possible left LE wound celluitis, intra-abdominal source or bacteremia - Pt ill-appearing with dusky and cool extremities. Has symptoms of N/V/D and left LE celluitis around wound - admit to step-down unit - Keep on Vancomycin and switch to cefepime and flagyl (due to severe AKI) for broad spectrum coverage pending blood cultures - received 1.5 L of fluid in the ED. Continue gentle maintentance IV fluid with close monitor since she is in A.fib with RVR  - Trend lactate - C.diff test  Acute hypoxic respiratory failure - later placed on NRB - suspect from metabolic acidosis and A.fib with RVR -continue to monitor closely   Acute encephalopathy secondary to sepsis, AKI and metabolic acidosis - continue to assess with treatment  Atrial fibrillation with RVR - improved with IV diltiazem bolus in ED but heart rate later trended upwards and diliazem gtt was started - BP is tenuous around 100/60 - Hold metoprolol while on gtt - might need to consider amiodarone if hypotensive  Borderline low BP - BP around 100/60 - hold  Dyazide   Metabolic acidosis - Anion gap was incalculable, bicarb of less than 7 - likely due to sepsis, AKI, dehydration - stat ABG showed pH 7.09/pCO2 19/pO2 217/bicarb 2.4 - will give an amp of bicarb especially with severe AKI.   Severe Acute kidney inury - creatinine of 3.48 and BUN of 52 (Prior creatinine at 1.05) - could be due to poor perfusion and dehydration - follow BMP after fluids and re-evaluate   Chronic UTI with left and right hydronephrosis - Hold Trimpex while on broad spectrum antibiotics -continue Flomax  Type 2 diabetes  - BG of 149 - monitor for now   DVT prophylaxis:Heparin Code Status:Full Family Communication: Plan discussed with husband at bedside  disposition Plan: Home with at least 2 midnight stays  Consults called:  Admission status: inpatient   Chalsey Leeth T Cydne Grahn DO [facility]   If 7PM-7AM, please contact night-coverage [email or web address] Password [email or web address]  04/12/2019, 11:23 PM

## 2019-04-12 NOTE — Progress Notes (Signed)
CODE SEPSIS - PHARMACY COMMUNICATION  **Broad Spectrum Antibiotics should be administered within 1 hour of Sepsis diagnosis**  Time Code Sepsis Called/Page Received:  12/5 @ 2116   Antibiotics Ordered:   Zosyn   Time of 1st antibiotic administration:   12/5 @ 2211  Additional action taken by pharmacy:   If necessary, Name of Provider/Nurse Contacted:     Jeweliana Dudgeon D ,PharmD Clinical Pharmacist  04/12/2019  10:21 PM

## 2019-04-12 NOTE — Consult Note (Signed)
PHARMACY -  BRIEF ANTIBIOTIC NOTE   Pharmacy has received consult(s) for Vancomycin from an ED provider.  The patient's profile has been reviewed for ht/wt/allergies/indication/available labs.    One time order(s) placed for Vancomycin 1250mg  IV  Further antibiotics/pharmacy consults should be ordered by admitting physician if indicated.                       Thank you, Pernell Dupre, PharmD, BCPS Clinical Pharmacist 04/12/2019 11:07 PM

## 2019-04-12 NOTE — ED Notes (Signed)
Husband brought meds in - at bedside

## 2019-04-12 NOTE — ED Notes (Signed)
Report to [place], rn.

## 2019-04-13 ENCOUNTER — Inpatient Hospital Stay: Payer: Medicare HMO

## 2019-04-13 DIAGNOSIS — E872 Acidosis: Secondary | ICD-10-CM

## 2019-04-13 DIAGNOSIS — A419 Sepsis, unspecified organism: Secondary | ICD-10-CM | POA: Diagnosis present

## 2019-04-13 DIAGNOSIS — L0390 Cellulitis, unspecified: Secondary | ICD-10-CM | POA: Diagnosis not present

## 2019-04-13 LAB — CBC
HCT: 30.9 % — ABNORMAL LOW (ref 36.0–46.0)
Hemoglobin: 9.2 g/dL — ABNORMAL LOW (ref 12.0–15.0)
MCH: 34.2 pg — ABNORMAL HIGH (ref 26.0–34.0)
MCHC: 29.8 g/dL — ABNORMAL LOW (ref 30.0–36.0)
MCV: 114.9 fL — ABNORMAL HIGH (ref 80.0–100.0)
Platelets: 218 10*3/uL (ref 150–400)
RBC: 2.69 MIL/uL — ABNORMAL LOW (ref 3.87–5.11)
RDW: 14.6 % (ref 11.5–15.5)
WBC: 20 10*3/uL — ABNORMAL HIGH (ref 4.0–10.5)
nRBC: 0.4 % — ABNORMAL HIGH (ref 0.0–0.2)

## 2019-04-13 LAB — BLOOD GAS, ARTERIAL
Acid-base deficit: 24.9 mmol/L — ABNORMAL HIGH (ref 0.0–2.0)
Acid-base deficit: 19.7 mmol/L — ABNORMAL HIGH (ref 0.0–2.0)
Acid-base deficit: 17 mmol/L — ABNORMAL HIGH (ref 0.0–2.0)
Bicarbonate: 2.4 mmol/L — ABNORMAL LOW (ref 20.0–28.0)
Bicarbonate: 5.3 mmol/L — ABNORMAL LOW (ref 20.0–28.0)
Bicarbonate: 7 mmol/L — ABNORMAL LOW (ref 20.0–28.0)
FIO2: 100
FIO2: 1
FIO2: 0.28
O2 Saturation: 99.5 %
O2 Saturation: 99.9 %
O2 Saturation: 98.8 %
Patient temperature: 37
Patient temperature: 37
Patient temperature: 37
pCO2 arterial: <19 mmHg — CL (ref 32.0–48.0)
pCO2 arterial: <19 mmHg — CL (ref 32.0–48.0)
pCO2 arterial: <19 mmHg — CL (ref 32.0–48.0)
pH, Arterial: 7.09 — CL (ref 7.350–7.450)
pH, Arterial: 7.22 — ABNORMAL LOW (ref 7.350–7.450)
pH, Arterial: 7.28 — ABNORMAL LOW (ref 7.350–7.450)
pO2, Arterial: 217 mmHg — ABNORMAL HIGH (ref 83.0–108.0)
pO2, Arterial: 291 mmHg — ABNORMAL HIGH (ref 83.0–108.0)
pO2, Arterial: 138 mmHg — ABNORMAL HIGH (ref 83.0–108.0)

## 2019-04-13 LAB — URINALYSIS, ROUTINE W REFLEX MICROSCOPIC
Bilirubin Urine: NEGATIVE
Glucose, UA: NEGATIVE mg/dL
Ketones, ur: NEGATIVE mg/dL
Nitrite: NEGATIVE
Protein, ur: 100 mg/dL — AB
RBC / HPF: >50 RBC/hpf — ABNORMAL HIGH (ref 0–5)
Specific Gravity, Urine: 1.024 (ref 1.005–1.030)
WBC, UA: >50 WBC/hpf — ABNORMAL HIGH (ref 0–5)
pH: 5 (ref 5.0–8.0)

## 2019-04-13 LAB — BASIC METABOLIC PANEL
BUN: 44 mg/dL — ABNORMAL HIGH (ref 8–23)
CO2: <7 mmol/L — ABNORMAL LOW (ref 22–32)
Calcium: 6.2 mg/dL — CL (ref 8.9–10.3)
Chloride: 111 mmol/L (ref 98–111)
Creatinine, Ser: 2.9 mg/dL — ABNORMAL HIGH (ref 0.44–1.00)
GFR calc Af Amer: 17 mL/min — ABNORMAL LOW (ref >60)
GFR calc non Af Amer: 15 mL/min — ABNORMAL LOW (ref >60)
Glucose, Bld: 369 mg/dL — ABNORMAL HIGH (ref 70–99)
Potassium: 3.6 mmol/L (ref 3.5–5.1)
Sodium: 142 mmol/L (ref 135–145)

## 2019-04-13 LAB — GLUCOSE, CAPILLARY
Glucose-Capillary: 204 mg/dL — ABNORMAL HIGH (ref 70–99)
Glucose-Capillary: 205 mg/dL — ABNORMAL HIGH (ref 70–99)
Glucose-Capillary: 167 mg/dL — ABNORMAL HIGH (ref 70–99)
Glucose-Capillary: 141 mg/dL — ABNORMAL HIGH (ref 70–99)

## 2019-04-13 LAB — SARS CORONAVIRUS 2 (TAT 6-24 HRS): SARS Coronavirus 2: NEGATIVE

## 2019-04-13 LAB — MRSA PCR SCREENING: MRSA by PCR: NEGATIVE

## 2019-04-13 LAB — LACTIC ACID, PLASMA
Lactic Acid, Venous: 3.4 mmol/L (ref 0.5–1.9)
Lactic Acid, Venous: 3.5 mmol/L (ref 0.5–1.9)

## 2019-04-13 LAB — HEPARIN LEVEL (UNFRACTIONATED): Heparin Unfractionated: 1.66 IU/mL — ABNORMAL HIGH (ref 0.30–0.70)

## 2019-04-13 LAB — PROCALCITONIN: Procalcitonin: 13.73 ng/mL

## 2019-04-13 MED ORDER — TAMSULOSIN HCL 0.4 MG PO CAPS: 0.4000 mg | ORAL_CAPSULE | Freq: Every day | ORAL | Status: DC

## 2019-04-13 MED ORDER — CHLORHEXIDINE GLUCONATE CLOTH 2 % EX PADS
6.0000 | MEDICATED_PAD | Freq: Every day | CUTANEOUS | Status: DC
  Administered 2019-04-13 – 2019-04-22 (×6): 6 via TOPICAL

## 2019-04-13 MED ORDER — SODIUM CHLORIDE 0.9 % IV SOLN: 250.0000 mL | INTRAVENOUS | Status: DC

## 2019-04-13 MED ORDER — NOREPINEPHRINE 4 MG/250ML-% IV SOLN
2.0000 ug/min | INTRAVENOUS | Status: DC
  Filled 2019-04-13 (×2): qty 250

## 2019-04-13 MED ORDER — ACETAMINOPHEN 325 MG PO TABS: 650.0000 mg | ORAL_TABLET | Freq: Four times a day (QID) | ORAL | Status: DC | PRN

## 2019-04-13 MED ORDER — SODIUM BICARBONATE 8.4 % IV SOLN
INTRAVENOUS | Status: DC
  Administered 2019-04-13 – 2019-04-15 (×4): via INTRAVENOUS
  Filled 2019-04-13 (×7): qty 100

## 2019-04-13 MED ORDER — VASOPRESSIN 20 UNIT/ML IV SOLN
0.0400 [IU]/min | INTRAVENOUS | Status: DC
  Administered 2019-04-13: 13:00:00 0.04 [IU]/min via INTRAVENOUS
  Filled 2019-04-13 (×2): qty 2

## 2019-04-13 MED ORDER — SODIUM CHLORIDE 0.9 % IV SOLN
2.0000 g | INTRAVENOUS | Status: DC
  Administered 2019-04-13 – 2019-04-16 (×4): 2 g via INTRAVENOUS
  Filled 2019-04-13 (×5): qty 2

## 2019-04-13 MED ORDER — VANCOMYCIN VARIABLE DOSE PER UNSTABLE RENAL FUNCTION (PHARMACIST DOSING): Status: DC

## 2019-04-13 MED ORDER — SODIUM CHLORIDE 0.9 % IV BOLUS: 500.0000 mL | Freq: Once | INTRAVENOUS | Status: DC

## 2019-04-13 MED ORDER — HEPARIN (PORCINE) 25000 UT/250ML-% IV SOLN
INTRAVENOUS | Status: AC
  Filled 2019-04-13: qty 250

## 2019-04-13 MED ORDER — ACETAMINOPHEN 10 MG/ML IV SOLN
1000.0000 mg | Freq: Four times a day (QID) | INTRAVENOUS | Status: DC
  Administered 2019-04-13 – 2019-04-14 (×3): 1000 mg via INTRAVENOUS
  Filled 2019-04-13 (×4): qty 100

## 2019-04-13 MED ORDER — ORAL CARE MOUTH RINSE
15.0000 mL | Freq: Two times a day (BID) | OROMUCOSAL | Status: DC
  Administered 2019-04-14 – 2019-04-22 (×14): 15 mL via OROMUCOSAL

## 2019-04-13 MED ORDER — SODIUM CHLORIDE 0.9 % IV SOLN
0.0000 ug/min | INTRAVENOUS | Status: DC
  Administered 2019-04-13: 20 ug/min via INTRAVENOUS
  Administered 2019-04-13: 80 ug/min via INTRAVENOUS
  Administered 2019-04-13: 90 ug/min via INTRAVENOUS
  Filled 2019-04-13 (×2): qty 10
  Filled 2019-04-13: qty 1
  Filled 2019-04-13: qty 10

## 2019-04-13 MED ORDER — ATORVASTATIN CALCIUM 20 MG PO TABS: 20.0000 mg | ORAL_TABLET | Freq: Every day | ORAL | Status: DC

## 2019-04-13 MED ORDER — TRIAMTERENE-HCTZ 37.5-25 MG PO CAPS: 1.0000 | ORAL_CAPSULE | Freq: Two times a day (BID) | ORAL | Status: DC

## 2019-04-13 MED ORDER — SODIUM CHLORIDE 0.45 % IV SOLN
INTRAVENOUS | Status: DC
  Administered 2019-04-13: 01:00:00 via INTRAVENOUS

## 2019-04-13 MED ORDER — STERILE WATER FOR INJECTION IV SOLN
INTRAVENOUS | Status: DC
  Administered 2019-04-13 (×2): via INTRAVENOUS
  Filled 2019-04-13 (×6): qty 850

## 2019-04-13 MED ORDER — SODIUM BICARBONATE 8.4 % IV SOLN
50.0000 meq | Freq: Once | INTRAVENOUS | Status: AC
  Administered 2019-04-13: 01:00:00 50 meq via INTRAVENOUS

## 2019-04-13 MED ORDER — SIMVASTATIN 10 MG PO TABS: 40.0000 mg | ORAL_TABLET | Freq: Every day | ORAL | Status: DC

## 2019-04-13 MED ORDER — SODIUM CHLORIDE 0.9 % IV BOLUS
500.0000 mL | Freq: Once | INTRAVENOUS | Status: AC
  Administered 2019-04-13: 500 mL via INTRAVENOUS

## 2019-04-13 MED ORDER — AMIODARONE HCL IN DEXTROSE 360-4.14 MG/200ML-% IV SOLN
60.0000 mg/h | INTRAVENOUS | Status: DC
  Administered 2019-04-13 – 2019-04-14 (×3): 30 mg/h via INTRAVENOUS
  Administered 2019-04-14 – 2019-04-16 (×7): 60 mg/h via INTRAVENOUS
  Filled 2019-04-13 (×8): qty 200

## 2019-04-13 MED ORDER — SODIUM CHLORIDE 0.9 % IV SOLN
2.0000 ug/min | INTRAVENOUS | Status: DC
  Administered 2019-04-13: 2 ug/min via INTRAVENOUS
  Administered 2019-04-13: 12:00:00 10 ug/min via INTRAVENOUS
  Filled 2019-04-13: qty 4

## 2019-04-13 MED ORDER — LACTATED RINGERS IV BOLUS
1000.0000 mL | Freq: Once | INTRAVENOUS | Status: AC
  Administered 2019-04-13: 1000 mL via INTRAVENOUS

## 2019-04-13 MED ORDER — HEPARIN BOLUS VIA INFUSION
3500.0000 [IU] | Freq: Once | INTRAVENOUS | Status: AC
  Administered 2019-04-13: 3500 [IU] via INTRAVENOUS
  Filled 2019-04-13: qty 3500

## 2019-04-13 MED ORDER — SODIUM CHLORIDE 0.9 % IV SOLN
INTRAVENOUS | Status: DC
  Administered 2019-04-13 (×2): via INTRAVENOUS

## 2019-04-13 MED ORDER — SODIUM BICARBONATE 8.4 % IV SOLN
INTRAVENOUS | Status: AC
  Filled 2019-04-13: qty 50

## 2019-04-13 MED ORDER — METRONIDAZOLE IN NACL 5-0.79 MG/ML-% IV SOLN
500.0000 mg | Freq: Three times a day (TID) | INTRAVENOUS | Status: DC
  Administered 2019-04-13 – 2019-04-15 (×7): 500 mg via INTRAVENOUS
  Filled 2019-04-13 (×10): qty 100

## 2019-04-13 MED ORDER — INSULIN ASPART 100 UNIT/ML ~~LOC~~ SOLN
0.0000 [IU] | SUBCUTANEOUS | Status: DC
  Administered 2019-04-14: 3 [IU] via SUBCUTANEOUS
  Administered 2019-04-14: 2 [IU] via SUBCUTANEOUS
  Administered 2019-04-14: 3 [IU] via SUBCUTANEOUS
  Administered 2019-04-14 (×3): 2 [IU] via SUBCUTANEOUS
  Administered 2019-04-14: 3 [IU] via SUBCUTANEOUS
  Administered 2019-04-15 (×2): 1 [IU] via SUBCUTANEOUS
  Administered 2019-04-15: 2 [IU] via SUBCUTANEOUS
  Filled 2019-04-13 (×9): qty 1

## 2019-04-13 MED ORDER — FAMOTIDINE 20 MG PO TABS: 20.0000 mg | ORAL_TABLET | ORAL | Status: DC

## 2019-04-13 MED ORDER — HEPARIN (PORCINE) 25000 UT/250ML-% IV SOLN
550.0000 [IU]/h | INTRAVENOUS | Status: DC
  Administered 2019-04-13: 800 [IU]/h via INTRAVENOUS
  Administered 2019-04-14 – 2019-04-15 (×2): 550 [IU]/h via INTRAVENOUS
  Filled 2019-04-13: qty 250

## 2019-04-13 MED ORDER — CALCIUM GLUCONATE-NACL 1-0.675 GM/50ML-% IV SOLN
1.0000 g | Freq: Once | INTRAVENOUS | Status: AC
  Administered 2019-04-13: 1000 mg via INTRAVENOUS
  Filled 2019-04-13: qty 50

## 2019-04-13 MED ORDER — SODIUM BICARBONATE 8.4 % IV SOLN
50.0000 meq | Freq: Once | INTRAVENOUS | Status: AC
  Administered 2019-04-13: 50 meq via INTRAVENOUS
  Filled 2019-04-13 (×2): qty 50

## 2019-04-13 MED ORDER — SODIUM CHLORIDE 0.9 % IV SOLN
0.0000 ug/min | INTRAVENOUS | Status: DC
  Administered 2019-04-13: 2 ug/min via INTRAVENOUS
  Filled 2019-04-13: qty 4

## 2019-04-13 MED ORDER — AMIODARONE HCL IN DEXTROSE 360-4.14 MG/200ML-% IV SOLN
60.0000 mg/h | INTRAVENOUS | Status: AC
  Administered 2019-04-13 (×2): 60 mg/h via INTRAVENOUS
  Filled 2019-04-13 (×2): qty 200

## 2019-04-13 NOTE — Consult Note (Signed)
ANTICOAGULATION CONSULT NOTE - Follow Up Consult  Pharmacy Consult for Heparin Infusion  Indication: atrial fibrillation  Allergies  Allergen Reactions  . Sulfa Antibiotics Nausea And Vomiting    Patient Measurements: Height: 5' (152.4 cm) Weight: 130 lb 1.1 oz (59 kg) IBW/kg (Calculated) : 45.5  Vital Signs: Temp: 94.3 F (34.6 C) (12/06 0641) Temp Source: Rectal (12/06 0641) BP: 97/56 (12/06 0600) Pulse Rate: 106 (12/06 0641)  Labs: Recent Labs    04/12/19 2137 04/13/19 0422 04/13/19 1244  HGB 12.8 9.2*  --   HCT 41.8 30.9*  --   PLT 292 218  --   LABPROT 13.8  --   --   INR 1.1  --   --   HEPARINUNFRC  --   --  1.66*  CREATININE 3.48* 2.90*  --     Estimated Creatinine Clearance: 12.6 mL/min (A) (by C-G formula based on SCr of 2.9 mg/dL (H)).  Assessment: Pharmacy consulted for heparin drip dosing and monitoring in 78 yo female with A. Fib. Patient has not been on anticoagulation PTA due to hx of hematuria.   Hgb: 12.8  Plts: 292  CrCl: 10.5  Scr: 3.48   12/6 1244 HL 1.66, spoke to RN to see if blood draw was possibly from the line the Heparin was infusing in but RN stated that patient only has peripheral access and that the Heparin had been turned off ~12:00 for a possible central line insertion. According to home med list patient was not taking anticoagulant prior to admission.  Goal of Therapy:  Heparin level 0.3-0.7 units/ml Monitor platelets by anticoagulation protocol: Yes   Plan:  Since Heparin has been off for ~3 hours will go ahead and restart at 700 units/hr, recheck HL in 8 hours. Daily CBC while on Heparin drip.  Paulina Fusi, PharmD, BCPS 04/13/2019 3:12 PM

## 2019-04-13 NOTE — Progress Notes (Addendum)
Tiffany Manning at [place] NAME: Tiffany Manning    MR#:  [id number]  DATE OF BIRTH:  08/02/1940  SUBJECTIVE:   Patient was brought in from home after she continues to have nausea vomiting intermittent episodes of diarrhea, failure to thrive in ability to care for herself. She is moaning. Unable to give any history review of systems . Currently on IV pressers. Husband in the room REVIEW OF SYSTEMS:   Review of Systems  Unable to perform ROS: Critical illness    DRUG ALLERGIES:   Allergies  Allergen Reactions  . Sulfa Antibiotics Nausea And Vomiting    VITALS:  Blood pressure (!) 97/56, pulse (!) 106, temperature (!) 94.3 F (34.6 C), temperature source Rectal, resp. rate 18, height 5' (1.524 m), weight 59 kg, SpO2 100 %.  PHYSICAL EXAMINATION:   Physical Exam  GENERAL:  78 y.o.-year-old patient lying in the bed with mild to moderate acute distress.  Patient critically ill EYES: Pupils equal, round, reactive to light and accommodation.  HEENT: Head atraumatic, normocephalic. Oropharynx and nasopharynx clear. Dry oral mucosa NECK:  Supple, no jugular venous distention. No thyroid enlargement, no tenderness.  LUNGS: shallow  breath sounds bilaterally, no wheezing, rales, rhonchi. No use of accessory muscles of respiration.  CARDIOVASCULAR: S1, S2 normal. No murmurs, rubs, or gallops. Tachycardia, a fib ABDOMEN: Soft, nontender, nondistended. Few bowel sounds present.  -EXTREMITIES: No cyanosis, clubbing or edema b/l.   Chronic venous stasis changes   NEUROLOGIC: moves all extremities spontaneously PSYCHIATRIC:  patient is lethargic SKIN: as above and RN documentation  LABORATORY PANEL:  CBC Recent Labs  Lab 04/13/19 0422  WBC 20.0*  HGB 9.2*  HCT 30.9*  PLT 218    Chemistries  Recent Labs  Lab 04/12/19 2137 04/13/19 0422  NA 140 142  K 4.6 3.6  CL 107 111  CO2 <7* <7*  GLUCOSE 216* 369*  BUN 52* 44*  CREATININE  3.48* 2.90*  CALCIUM 8.3* 6.2*  AST 31  --   ALT 38  --   ALKPHOS 92  --   BILITOT 1.0  --    Cardiac Enzymes No results for input(s): TROPONINI in the last 168 hours. RADIOLOGY:  Dg Abdomen Acute W/chest  Result Date: 04/12/2019 CLINICAL DATA:  Nausea vomiting EXAM: DG ABDOMEN ACUTE W/ 1V CHEST COMPARISON:  None. FINDINGS: There is no evidence of dilated bowel loops or free intraperitoneal air. No radiopaque calculi or other significant radiographic abnormality is seen. There is mild cardiomegaly. No large airspace consolidation. Minimal nonspecific patchy airspace opacity seen within the right mid lung. IMPRESSION: Negative abdominal radiographs. Minimal patchy airspace opacity seen within the right mid lung, which is non-specific could be due scarring and/or atelectasis. Electronically Signed   By: Prudencio Pair M.D.   On: 04/12/2019 21:44   ASSESSMENT AND PLAN:   Tiffany Manning is a 78 y.o. female with medical history significant of hypertension, hyperlipidemia, recurrent urinary tract infectionson chronic suppressive antibiotic, diabetes mellitus, A. fib not on anticoagulation due to hx of hematuria who presented with persistent nausea, vomiting, diarrhea/loose stool and decrease PO intake with increased fatiguability and weakness.  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 intra-abdominal source  - Pt ill-appearing with dusky and cool extremities. Has symptoms of N/V/D and left LE celluitis around wound -patient was tachycardic, hypotensive, tachypnea, elevated white count and lactic acid when she presented to  ER - continue with  Vancomycin , cefepime and flagyl (due to severe AKI) for broad spectrum coverage pending blood cultures -continue IV fluids, IV phenylephrine given septic shock -Blood culture so far negative -- received 1.5 L of fluid in the ED. Continue gentle maintentance IV  fluid with close monitor since she is in A.fib with RVR  - Trend lactate  4.2--3.6--3.4--3.5 - C.diff test pending (ordered from ER)  *Severe Acidosis with Acute on Chronic renal failure II/ ATN -likely due to sepsis, AKI, dehydration -patient currently on bicarb drip along with IV fluids -remains quite acidotic with bicarb less than seven -nephrology consultation placed with Dr. Wynelle Link -monitor input output, avoid nephrotoxic agents -baseline creatinine 1.07 (November 2020) -came in with creatinine 3.48 --2.9  *Acute hypoxic respiratory failure in the setting of Sepsis -patient was on on NRB---now on nasal cannula - suspect from metabolic acidosis and A.fib with RVR -continue to monitor closely  *Acute encephalopathy secondary to sepsis, AKI and metabolic acidosis - continue to assess with treatment  *Atrial fibrillation with RVR -patient was initially started on IV diltiazem drip however her blood pressure dropped-- now on amiodarone drip -IV heparin drip for anticoagulation ---patient has recurrent hematuria and reviewing records in the past patient has been off anticoagulation. Will discussed with ICU attending and DC heparin drip - BP is tenuous around 100/60 -is followed by [facility] cardiology consider consultation if needed  *severe hypotension secondary to septic shock -on IV phenylephrine - hold BP meds  *acute on chronic UTI with left and right hydronephrosis - Hold Trimpex while on broad spectrum antibiotics -continue Flomax -patient has complex history of recurrent UTI with chronic hydronephrosis requiring stent placement in the past.  -Patient follows with [facility] urology  Type 2 diabetes, hypoglycemia in the setting of sepsis -A1c  was 6.4 (03/2019) -SSI for now  patient is severely sick. She is changed to ICU status. I spoke with Dr. Karna Christmas and patient will be under ICU attending service. [facility] hospital[facility] will pick up patient once her status  changes to step down/ floor care.   Family communication : Drs Customer service manager and me all discussed with pt's husband at bedside Consults : ICU, nephrology Discharge Disposition : TBD CODE STATUS: DNR with temporary intubation if needed DVT Prophylaxis : SQ heparin after HD cath placed  TOTAL crtical TIME TAKING CARE OF THIS PATIENT: *35* minutes.  >50% time spent on counselling and coordination of care   Note: This dictation was prepared with Dragon dictation along with smaller phrase technology. Any transcriptional errors that result from this process are unintentional.  Enedina Finner M.D on 04/13/2019 at 11:13 AM  Between 7am to 6pm - Pager - [phone]  After 6pm go to [email or web address]  [facility]   CC: Primary care physician; Jaclyn Shaggy, MDPatient ID: Tiffany Manning, female   DOB: 1940/10/11, 78 y.o.   MRN: [id number]

## 2019-04-13 NOTE — ED Notes (Signed)
MD Tu notified of critical calcium level 6.2 via telephone. No new orders received.

## 2019-04-13 NOTE — ED Notes (Signed)
Pt with increased work of breathing, hands and feet cool to touch, nailbeds are cyanotic. Pt placed on non rebreather mask at 15lpm for pox of 90% on 4lpm [place] and increased work of breathing. Dr. Jacqualine Code to bedside to reevaluate.

## 2019-04-13 NOTE — ED Notes (Signed)
Attempt to call report to [facility], rn not available.  

## 2019-04-13 NOTE — Progress Notes (Addendum)
Notified by nursing that patient's blood pressure has decreased to 70/40 despite 3L of fluid resuscitation.  Critical care consulted and discussed case with critical care Dr. Madalyn Rob and he provided the following recommendations.   Septic shock -Start phenylephrine drip - D/C flomax  Atrial fibrillation with RVR  discontinue diltiazem drip.  Start amiodarone and heparin drip.D/C heparin SQ.  Metabolic acidosis -Give another amp of bicarb.  Patient has already received one earlier this evening. -Start 150 meq bicarb drip at 125/hr   This was discussed with patient's nurse, April Brumgard, RN.

## 2019-04-13 NOTE — ED Notes (Signed)
Report from susan, rn.  

## 2019-04-13 NOTE — ED Notes (Signed)
ED TO INPATIENT HANDOFF REPORT  ED Nurse Name and Phone #: Jamison Soward [phone]   S Name/Age/Gender Tiffany Manning 78 y.o. female Room/Bed: ED17A/ED17A  Code Status   Code Status: Full Code  Home/SNF/Other  Patient oriented to: self Is this baseline? No   Triage Complete: Triage complete  Chief Complaint N/V/Diarrhea  Triage Note Pt d/c a few weeks ago from admitted for uti - per family has not been able to eat or drink and refused to let them change the bandage on her left leg. Wrapping and dressing removed with aide of ns and wound noted to have drainage.   Allergies Allergies  Allergen Reactions  . Sulfa Antibiotics Nausea And Vomiting    Level of Care/Admitting Diagnosis ED Disposition    ED Disposition Condition Comment   Admit  Hospital Area: [facility] [[id number]]  Level of Care: Stepdown [14]  Covid Evaluation: Asymptomatic Screening Protocol (No Symptoms)  Diagnosis: Sepsis [facility]) [[id number]]  Admitting Physician: Anselm Jungling [[id number]]  Attending Physician: Anselm Jungling [[id number]]  Estimated length of stay: past midnight tomorrow  Certification:: I certify this patient will need inpatient services for at least 2 midnights  PT Class (Do Not Modify): Inpatient [101]  PT Acc Code (Do Not Modify): Private [1]       B Medical/Surgery History Past Medical History:  Diagnosis Date  . A-fib (HCC)   . Arthritis   . Diabetes mellitus without complication (HCC)   . History of kidney stones   . Hyperlipidemia   . Hypertension   . PONV (postoperative nausea and vomiting)   . Sepsis [facility])    Past Surgical History:  Procedure Laterality Date  . ABDOMINAL HYSTERECTOMY    . BREAST SURGERY Left    lumpectomy  . CATARACT EXTRACTION W/ INTRAOCULAR LENS  IMPLANT, BILATERAL    . CHOLECYSTECTOMY    . CYSTOGRAM N/A 03/15/2018   Procedure: CYSTOGRAM;  Surgeon: Sondra Come, MD;  Location: [name] ORS;  Service: Urology;  Laterality: N/A;  . CYSTOSCOPY W/  RETROGRADES Bilateral 03/15/2018   Procedure: CYSTOSCOPY WITH RETROGRADE PYELOGRAM;  Surgeon: Sondra Come, MD;  Location: [name] ORS;  Service: Urology;  Laterality: Bilateral;  . CYSTOSCOPY W/ URETERAL STENT REMOVAL Bilateral 03/15/2018   Procedure: CYSTOSCOPY WITH STENT REMOVAL;  Surgeon: Sondra Come, MD;  Location: [name] OR[facility];  Service: Urology;  Laterality: Bilateral;  . CYSTOSCOPY WITH BIOPSY N/A 03/15/2018   Procedure: CYSTOSCOPY WITH Bladder BIOPSY;  Surgeon: Sondra Come, MD;  Location: [name] ORS;  Service: Urology;  Laterality: N/A;  . CYSTOSCOPY WITH FULGERATION N/A 03/15/2018   Procedure: CYSTOSCOPY WITH FULGERATION;  Surgeon: Sondra Come, MD;  Location: [name] ORS;  Service: Urology;  Laterality: N/A;  . CYSTOSCOPY WITH STENT PLACEMENT Bilateral 01/28/2018   Procedure: CYSTOSCOPY WITH STENT PLACEMENT;  Surgeon: Bjorn Pippin, MD;  Location: [name] OR[facility];  Service: Urology;  Laterality: Bilateral;  . CYSTOSCOPY WITH URETEROSCOPY Bilateral 03/15/2018   Procedure: CYSTOSCOPY WITH URETEROSCOPY;  Surgeon: Sondra Come, MD;  Location: [name] OR[facility];  Service: Urology;  Laterality: Bilateral;  . SALIVARY STONE REMOVAL    . TONSILLECTOMY       A IV Location/Drains/Wounds Patient Lines/Drains/Airways Status   Active Line/Drains/Airways    Name:   Placement date:   Placement time:   Site:   Days:   Peripheral IV 04/12/19 Right Forearm   04/12/19    2209    Forearm   1   Peripheral IV 04/12/19 Left Antecubital  04/12/19    2210    Antecubital   1   Peripheral IV 04/13/19 Left Forearm   04/13/19    0248    Forearm   less than 1   Peripheral IV 04/13/19 Right Antecubital   04/13/19    0434    Antecubital   less than 1   External Urinary Catheter   04/12/19    2242    -   1   Pressure Injury 01/27/18 Stage I -  Intact skin with non-blanchable redness of a localized area usually over a bony prominence.   01/27/18    1115     441   Wound / Incision (Open or Dehisced) 03/26/19 Other (Comment)  Leg Left;Lateral   03/26/19    -    Leg   18          Intake/Output Last 24 hours  Intake/Output Summary (Last 24 hours) at 04/13/2019 0981 Last data filed at 04/13/2019 0320 Gross per 24 hour  Intake 2938.02 ml  Output -  Net 2938.02 ml    Labs/Imaging Results for orders placed or performed during the hospital encounter of 04/12/19 (from the past 48 hour(s))  Lactic acid, plasma     Status: Abnormal   Collection Time: 04/12/19  9:37 PM  Result Value Ref Range   Lactic Acid, Venous 4.2 (HH) 0.5 - 1.9 mmol/L    Comment: CRITICAL RESULT CALLED TO, READ BACK BY AND VERIFIED WITH SUSAN NEAL 04/12/19 @ 2207  MLK Performed at [facility], [address] [place], [place] [address]   Comprehensive metabolic panel     Status: Abnormal   Collection Time: 04/12/19  9:37 PM  Result Value Ref Range   Sodium 140 135 - 145 mmol/L   Potassium 4.6 3.5 - 5.1 mmol/L   Chloride 107 98 - 111 mmol/L   CO2 <7 (L) 22 - 32 mmol/L   Glucose, Bld 216 (H) 70 - 99 mg/dL   BUN 52 (H) 8 - 23 mg/dL   Creatinine, Ser 8.29 (H) 0.44 - 1.00 mg/dL   Calcium 8.3 (L) 8.9 - 10.3 mg/dL   Total Protein 6.1 (L) 6.5 - 8.1 g/dL   Albumin 3.4 (L) 3.5 - 5.0 g/dL   AST 31 15 - 41 U/L   ALT 38 0 - 44 U/L   Alkaline Phosphatase 92 38 - 126 U/L   Total Bilirubin 1.0 0.3 - 1.2 mg/dL   GFR calc non Af Amer 12 (L) >60 mL/min   GFR calc Af Amer 14 (L) >60 mL/min   Anion gap NOT CALCULATED 5 - 15    Comment: Performed at [facility], [address] [place], [place] [address]  Lipase, blood     Status: None   Collection Time: 04/12/19  9:37 PM  Result Value Ref Range   Lipase 39 11 - 51 U/L    Comment: Performed at [facility], [address] [place], [place] [address]  CBC WITH DIFFERENTIAL     Status: Abnormal   Collection Time: 04/12/19  9:37 PM  Result Value Ref Range   WBC 16.1 (H) 4.0 - 10.5 K/uL   RBC 3.69 (L) 3.87 - 5.11 MIL/uL   Hemoglobin 12.8 12.0 - 15.0 g/dL   HCT 84.6  96.2 - 95.2 %   MCV 113.3 (H) 80.0 - 100.0 fL   MCH 34.7 (H) 26.0 - 34.0 pg   MCHC 30.6 30.0 - 36.0 g/dL   RDW 84.1 32.4 -  15.5 %   Platelets 292 150 - 400 K/uL   nRBC 0.5 (H) 0.0 - 0.2 %   Neutrophils Relative % 81 %   Neutro Abs 13.1 (H) 1.7 - 7.7 K/uL   Lymphocytes Relative 13 %   Lymphs Abs 2.1 0.7 - 4.0 K/uL   Monocytes Relative 3 %   Monocytes Absolute 0.4 0.1 - 1.0 K/uL   Eosinophils Relative 0 %   Eosinophils Absolute 0.0 0.0 - 0.5 K/uL   Basophils Relative 0 %   Basophils Absolute 0.0 0.0 - 0.1 K/uL   Immature Granulocytes 3 %   Abs Immature Granulocytes 0.49 (H) 0.00 - 0.07 K/uL    Comment: Performed at [facility], [address] [place], [place] [address]  Protime-INR     Status: None   Collection Time: 04/12/19  9:37 PM  Result Value Ref Range   Prothrombin Time 13.8 11.4 - 15.2 seconds   INR 1.1 0.8 - 1.2    Comment: (NOTE) INR goal varies based on device and disease states. Performed at [facility], [address] [place], [place] [address]   Lactic acid, plasma     Status: Abnormal   Collection Time: 04/12/19 11:21 PM  Result Value Ref Range   Lactic Acid, Venous 3.6 (HH) 0.5 - 1.9 mmol/L    Comment: CRITICAL VALUE NOTED. VALUE IS CONSISTENT WITH PREVIOUSLY REPORTED/CALLED VALUE RWW Performed at [facility], [address] [place], [place] [address]   Blood gas, arterial     Status: Abnormal   Collection Time: 04/12/19 11:21 PM  Result Value Ref Range   FIO2 100.00    pH, Arterial 7.09 (LL) 7.350 - 7.450    Comment: CRITICAL RESULT, NOTIFIED PHYSICIAN DR Jacqualine Code, [id number] 0025 DT PAGED DR TU ALSO DT    pCO2 arterial <19.0 (LL) 32.0 - 48.0 mmHg    Comment: CRITICAL RESULT, NOTIFIED PHYSICIAN DR Jacqualine Code 34196222 VDT PAGED DR TU ALSO PER DR QUALE DT    pO2, Arterial 217 (H) 83.0 - 108.0 mmHg   Bicarbonate 2.4 (L) 20.0 - 28.0 mmol/L   Acid-base deficit 24.9 (H) 0.0 - 2.0 mmol/L   O2 Saturation 99.5 %   Patient  temperature 37.0    Collection site RBR    Sample type ARTERIAL DRAW    Allens test (pass/fail) PASS PASS    Comment: Performed at [facility], [address] [place], [place] [address]  Glucose, capillary     Status: Abnormal   Collection Time: 04/12/19 11:27 PM  Result Value Ref Range   Glucose-Capillary 149 (H) 70 - 99 mg/dL  Urinalysis, Routine w reflex microscopic     Status: Abnormal   Collection Time: 04/13/19 12:00 AM  Result Value Ref Range   Color, Urine YELLOW (A) YELLOW   APPearance TURBID (A) CLEAR   Specific Gravity, Urine 1.024 1.005 - 1.030   pH 5.0 5.0 - 8.0   Glucose, UA NEGATIVE NEGATIVE mg/dL   Hgb urine dipstick LARGE (A) NEGATIVE   Bilirubin Urine NEGATIVE NEGATIVE   Ketones, ur NEGATIVE NEGATIVE mg/dL   Protein, ur 100 (A) NEGATIVE mg/dL   Nitrite NEGATIVE NEGATIVE   Leukocytes,Ua LARGE (A) NEGATIVE   RBC / HPF >50 (H) 0 - 5 RBC/hpf   WBC, UA >50 (H) 0 - 5 WBC/hpf   Bacteria, UA RARE (A) NONE SEEN   Squamous Epithelial / LPF 0-5 0 - 5   WBC Clumps PRESENT     Comment: Performed at [facility], [address] [place], [place] [address]  Lactic acid, plasma     Status: Abnormal   Collection Time: 04/13/19  1:12 AM  Result Value Ref Range   Lactic Acid, Venous 3.4 (HH) 0.5 - 1.9 mmol/L    Comment: CRITICAL VALUE NOTED. VALUE IS CONSISTENT WITH PREVIOUSLY REPORTED/CALLED VALUE RWW Performed at [facility], [address] [place], [place] [address]   Lactic acid, plasma     Status: Abnormal   Collection Time: 04/13/19  4:22 AM  Result Value Ref Range   Lactic Acid, Venous 3.5 (HH) 0.5 - 1.9 mmol/L    Comment: CRITICAL VALUE NOTED. VALUE IS CONSISTENT WITH PREVIOUSLY REPORTED/CALLED VALUE RWW Performed at [facility], [address] [place], [place] [address]   CBC     Status: Abnormal   Collection Time: 04/13/19  4:22 AM  Result Value Ref Range   WBC 20.0 (H) 4.0 - 10.5 K/uL   RBC 2.69 (L) 3.87 - 5.11  MIL/uL   Hemoglobin 9.2 (L) 12.0 - 15.0 g/dL    Comment: REPEATED TO VERIFY   HCT 30.9 (L) 36.0 - 46.0 %   MCV 114.9 (H) 80.0 - 100.0 fL   MCH 34.2 (H) 26.0 - 34.0 pg   MCHC 29.8 (L) 30.0 - 36.0 g/dL   RDW 91.4 78.2 - 95.6 %   Platelets 218 150 - 400 K/uL   nRBC 0.4 (H) 0.0 - 0.2 %    Comment: Performed at [facility], [address] [place], [place] [address]  Blood gas, arterial     Status: Abnormal   Collection Time: 04/13/19  4:22 AM  Result Value Ref Range   FIO2 1.00    pH, Arterial 7.22 (L) 7.350 - 7.450    Comment: CRITICAL RESULT, NOTIFIED PHYSICIAN TEXT MESS DR TU TO VERIFY RESULTS [id number] DT    pCO2 arterial <19.0 (LL) 32.0 - 48.0 mmHg    Comment: CRITICAL RESULT, NOTIFIED PHYSICIAN TEST MESS DR TU TO VERIFY RESULTS    pO2, Arterial 291 (H) 83.0 - 108.0 mmHg   Bicarbonate 5.3 (L) 20.0 - 28.0 mmol/L   Acid-base deficit 19.7 (H) 0.0 - 2.0 mmol/L   O2 Saturation 99.9 %   Patient temperature 37.0    Collection site LEFT BRACHIAL    Sample type ARTERIAL DRAW    Allens test (pass/fail) PASS PASS    Comment: Performed at [facility], [address] [place], [place] [address]   Dg Abdomen Acute W/chest  Result Date: 04/12/2019 CLINICAL DATA:  Nausea vomiting EXAM: DG ABDOMEN ACUTE W/ 1V CHEST COMPARISON:  None. FINDINGS: There is no evidence of dilated bowel loops or free intraperitoneal air. No radiopaque calculi or other significant radiographic abnormality is seen. There is mild cardiomegaly. No large airspace consolidation. Minimal nonspecific patchy airspace opacity seen within the right mid lung. IMPRESSION: Negative abdominal radiographs. Minimal patchy airspace opacity seen within the right mid lung, which is non-specific could be due scarring and/or atelectasis. Electronically Signed   By: Jonna Clark M.D.   On: 04/12/2019 21:44    Pending Labs Unresulted Labs (From admission, onward)    Start     Ordered   04/13/19 2200  Vancomycin,  random  Once-Timed,   STAT     04/13/19 0210   04/13/19 1215  Heparin level (unfractionated)  Once-Timed,   STAT     04/13/19 0445   04/13/19 0352  Basic metabolic panel  Once,   STAT  04/13/19 0351   04/13/19 0010  C difficile quick scan w PCR reflex  (C Difficile quick screen w PCR reflex panel)  Once, for 24 hours,   STAT     04/13/19 0009   04/12/19 2231  SARS CORONAVIRUS 2 (TAT 6-24 HRS) Nasopharyngeal Nasopharyngeal Swab  (Asymptomatic/Tier 3)  Once,   STAT    Question Answer Comment  Is this test for diagnosis or screening Diagnosis of ill patient   Symptomatic for COVID-19 as defined by CDC Yes   Date of Symptom Onset 04/08/2019   Hospitalized for COVID-19 Unknown   Admitted to ICU for COVID-19 No   Previously tested for COVID-19 Yes   Resident in a congregate (group) care setting No   Employed in healthcare setting No   Pregnant No      04/12/19 2230   04/12/19 2116  Blood Culture (routine x 2)  BLOOD CULTURE X 2,   STAT     04/12/19 2117          Vitals/Pain Today's Vitals   04/13/19 0430 04/13/19 0440 04/13/19 0450 04/13/19 0500  BP: (!) 68/59 95/63 (!) 88/32 109/72  Pulse: 86 (!) 109 (!) 114 (!) 114  Resp: 20 (!) 23 (!) 25 (!) 25  Temp:      TempSrc:      SpO2: 100% 100% 100% 99%  Weight:      Height:        Isolation Precautions Enteric precautions (UV disinfection)  Medications Medications  acetaminophen (TYLENOL) tablet 650 mg (has no administration in time range)  famotidine (PEPCID) tablet 20 mg (has no administration in time range)  atorvastatin (LIPITOR) tablet 20 mg (has no administration in time range)  0.9 %  sodium chloride infusion ( Intravenous Manning Bag/Given 04/13/19 0110)  metroNIDAZOLE (FLAGYL) IVPB 500 mg (0 mg Intravenous Stopped 04/13/19 0308)  ceFEPIme (MAXIPIME) 2 g in sodium chloride 0.9 % 100 mL IVPB (has no administration in time range)  vancomycin variable dose per unstable renal function (pharmacist dosing) (has no  administration in time range)  phenylephrine (NEO-SYNEPHRINE) 10 mg in sodium chloride 0.9 % 250 mL (0.04 mg/mL) infusion (60 mcg/min Intravenous Rate/Dose Change 04/13/19 0452)  sodium bicarbonate 150 mEq in sterile water 1,000 mL infusion ( Intravenous Manning Bag/Given 04/13/19 0324)  amiodarone (NEXTERONE PREMIX) 360-4.14 MG/200ML-% (1.8 mg/mL) IV infusion (60 mg/hr Intravenous Manning Bag/Given 04/13/19 0327)  amiodarone (NEXTERONE PREMIX) 360-4.14 MG/200ML-% (1.8 mg/mL) IV infusion (has no administration in time range)  heparin bolus via infusion 3,500 Units (3,500 Units Intravenous Bolus from Bag 04/13/19 0410)    And  heparin ADULT infusion 100 units/mL (25000 units/242mL sodium chloride 0.45%) (800 Units/hr Intravenous Manning Bag/Given 04/13/19 0416)  ondansetron (ZOFRAN) injection 4 mg (4 mg Intravenous Given 04/12/19 2157)  sodium chloride 0.9 % bolus 500 mL (0 mLs Intravenous Stopped 04/12/19 2251)  diltiazem (CARDIZEM) injection 10 mg (10 mg Intravenous Given 04/12/19 2209)  piperacillin-tazobactam (ZOSYN) IVPB 3.375 g (0 g Intravenous Stopped 04/12/19 2241)  sodium chloride 0.9 % bolus 500 mL (0 mLs Intravenous Stopped 04/13/19 0027)  sodium chloride 0.9 % bolus 500 mL (0 mLs Intravenous Stopped 04/13/19 0027)  vancomycin (VANCOCIN) 1,250 mg in sodium chloride 0.9 % 250 mL IVPB (0 mg Intravenous Stopped 04/13/19 0047)  sodium chloride 0.9 % bolus 500 mL (0 mLs Intravenous Stopped 04/13/19 0144)  sodium bicarbonate injection 50 mEq (50 mEq Intravenous Given 04/13/19 0110)  sodium chloride 0.9 % bolus 500 mL (0 mLs Intravenous Stopped 04/13/19 0228)  sodium bicarbonate injection 50 mEq (50 mEq Intravenous Given 04/13/19 0328)    Mobility non-ambulatory Moderate fall risk   Focused Assessments Cardiac Assessment Handoff:  Cardiac Rhythm: Atrial fibrillation Lab Results  Component Value Date   CKTOTAL 175 01/29/2018   No results found for: DDIMER Does the Patient currently have chest pain? No      R Recommendations: See Admitting Provider Note  Report given to:   Additional Notes:

## 2019-04-13 NOTE — ED Notes (Signed)
Report to felicia, rn.  

## 2019-04-13 NOTE — ED Notes (Signed)
Iv team here for consult.  

## 2019-04-13 NOTE — ED Notes (Signed)
Will give heparin after iv team has placed iv.

## 2019-04-13 NOTE — ED Notes (Signed)
Sitter at bedside to encourage pt from removing lines and oxygen.

## 2019-04-13 NOTE — Consult Note (Signed)
Pharmacy Antibiotic Note  Tiffany Manning is a 78 y.o. female admitted on 04/12/2019 with sepsis secondary to LE wound cellulitis and possible intra-abdominal infection.  Pharmacy has been consulted for cefepime and Vancomycin dosing. Patient is also receiving Flagyl.   Patient currently has severe acute kidney injury. Scr on admission 3.48. Baseline ~ 1.05  Plan: Vancomycin 1250mg  IV x 1 dose given in ED. Due to unstable renal function pharmacy will dose by vanc levels. Vancomycin level ordered 12/6 @ 2200.  Start cefepime 2g IV every 24 hours   Height: 5' (152.4 cm) Weight: 130 lb 1.1 oz (59 kg) IBW/kg (Calculated) : 45.5  Temp (24hrs), Avg:97.8 F (36.6 C), Min:97.8 F (36.6 C), Max:97.8 F (36.6 C)  Recent Labs  Lab 04/12/19 2137 04/12/19 2321 04/13/19 0112  WBC 16.1*  --   --   CREATININE 3.48*  --   --   LATICACIDVEN 4.2* 3.6* 3.4*    Estimated Creatinine Clearance: 10.5 mL/min (A) (by C-G formula based on SCr of 3.48 mg/dL (H)).    Allergies  Allergen Reactions  . Sulfa Antibiotics Nausea And Vomiting    Antimicrobials this admission: 12/5 Zosyn >> x1 12/5 Vancomycin>> 12/6 Cefepime>> 12/6 flagyl>>   Microbiology results: 12/5 BCx: pending 12/5 SARS Coronavirus 2: pending   Thank you for allowing pharmacy to be a part of this patient's care.  Pernell Dupre, PharmD, BCPS Clinical Pharmacist 04/13/2019 2:09 AM

## 2019-04-13 NOTE — ED Notes (Addendum)
Left lower leg wound dressed and cleansed. Lab called for need for venipuncture assist with lactic acid.

## 2019-04-13 NOTE — Progress Notes (Signed)
Pt has remained alert and oriented to self and place with c/o urinary urgency and discomfort. Pt hypothermic upon shift assessment->warming blanket maintained. Pt has remained in Afib on cardiac monitor with rate between 110-130s non-sustaining->Amio/heparin gtt.  BP has remained labile-> pt transitioned to Levo/Vaso and off Neo today for BP support. Per Lanney Gins, MD he would prefer vasopressin for initial BP support d/t patients peripheral cyanosis. Pt has received a total of 2 LR bolus' and 4 NS bolus' for fluid resuscitation. Urethreal catheter placed->234ml tea colored, sediment. Pt transitioned to [facility] from NRB-15L today. Lung sounds clear to auscultation, SpO2 > 90%, NDN. Pt husband visiting and has been updated at bedside.

## 2019-04-13 NOTE — Consult Note (Addendum)
ANTICOAGULATION CONSULT NOTE - Follow Up Consult  Pharmacy Consult for Heparin Infusion  Indication: atrial fibrillation  Allergies  Allergen Reactions  . Sulfa Antibiotics Nausea And Vomiting    Patient Measurements: Height: 5' (152.4 cm) Weight: 130 lb 1.1 oz (59 kg) IBW/kg (Calculated) : 45.5  Vital Signs: Temp: 97.8 F (36.6 C) (12/05 2125) Temp Source: Axillary (12/05 2125) BP: 81/50 (12/06 0245) Pulse Rate: 117 (12/06 0231)  Labs: Recent Labs    04/12/19 2137  HGB 12.8  HCT 41.8  PLT 292  LABPROT 13.8  INR 1.1  CREATININE 3.48*    Estimated Creatinine Clearance: 10.5 mL/min (A) (by C-G formula based on SCr of 3.48 mg/dL (H)).  Assessment: Pharmacy consulted for heparin drip dosing and monitoring in 78 yo female with A. Fib. Patient has not been on anticoagulation PTA due to hx of hematuria.   Hgb: 12.8  Plts: 292  CrCl: 10.5  Scr: 3.48   Goal of Therapy:  Heparin level 0.3-0.7 units/ml Monitor platelets by anticoagulation protocol: Yes   Plan:  Give 3500 units bolus x 1 Start heparin infusion at 800 units/hr Check anti-Xa level in 8 hours and daily while on heparin Continue to monitor H&H and platelets  Pernell Dupre, PharmD, BCPS Clinical Pharmacist 04/13/2019 3:40 AM

## 2019-04-13 NOTE — Consult Note (Signed)
CRITICAL CARE PROGRESS NOTE    Name: Tiffany Manning MRN: [id number] DOB: 1940/10/31     LOS: 1   SUBJECTIVE FINDINGS & SIGNIFICANT EVENTS   Patient description:   Tiffany Manning is a 78 y.o. female with medical history significant of hypertension, hyperlipidemia, recurrent urinary tract infectionson chronic suppressive antibiotic, diabetes mellitus, A. fib not on anticoagulation due to hx of hematuria who presented with persistent nausea, vomiting, diarrhea/loose stool and decrease PO intake. Also has been fatigued and weak. Today Husband could no longer help her given her weakness and decided to come to ED. Husband gives history since she is lethargic.   She was recently hospitalized from 11/16-11/20 where she presented with similar symptoms of nausea, vomiting and decrease appetite. She was found to be in Atrial fibrillation with RVR which resolved with diltiazem bolus and home rate controlled medication. ID was consulted for bacteruria and recommended no antibiotics or C.diff testing since she was afebrile and without leukocytosis. Her weakness was not able to be access since she refused to work with nursing or PT during last admission. Husband felt that she was not back to her baseline but took her home since patient insisted. Since returning home she continued to have symptoms of nausea, vomiting and intermittent diarrhea with worsening progressively weakness.  In the emergency department patient was afebrile but tachypnic in atrial fibrillation with RVR with rates initially up to 160. and and had borderline blood pressure of 100/60.  Her heart rate ranged from about 110-120s after IV diltiazem bolus in the ED.  Patient was ill-appearing with cold and dusky extremities.  She was lethargic and required multiple voice  commands to wake up. CBC shows leukocytosis of 16.1, stable hemoglobin of 12.8. Sodium of 140. Potassium of 4.6. CO2 of less than 7. Glucose of 216. BUN of 52, creatinine of 3.48 elevated from 1.05 two weeks ago.  Lactate of 4.2. Anion gap was incalculable.   Lines / Drains: PIV x2  Cultures / Sepsis markers: UA suggestive of urinary tract infection Blood cultures negative to date-12 hours Urine cultures pending Nasal PCR MRSA pending Procalcitonin trend  Antibiotics: Empiric vancomycin and cefepime   Protocols / Consultants: Hospitalist service and critical care consultation     PAST MEDICAL HISTORY   Past Medical History:  Diagnosis Date  . A-fib (HCC)   . Arthritis   . Diabetes mellitus without complication (HCC)   . History of kidney stones   . Hyperlipidemia   . Hypertension   . PONV (postoperative nausea and vomiting)   . Sepsis [facility])      SURGICAL HISTORY   Past Surgical History:  Procedure Laterality Date  . ABDOMINAL HYSTERECTOMY    . BREAST SURGERY Left    lumpectomy  . CATARACT EXTRACTION W/ INTRAOCULAR LENS  IMPLANT, BILATERAL    . CHOLECYSTECTOMY    . CYSTOGRAM N/A 03/15/2018   Procedure: CYSTOGRAM;  Surgeon: Sondra Come, MD;  Location: [name] ORS;  Service: Urology;  Laterality: N/A;  . CYSTOSCOPY W/ RETROGRADES Bilateral 03/15/2018   Procedure: CYSTOSCOPY WITH RETROGRADE PYELOGRAM;  Surgeon: Sondra Come, MD;  Location: [name] ORS;  Service: Urology;  Laterality: Bilateral;  . CYSTOSCOPY W/ URETERAL STENT REMOVAL Bilateral 03/15/2018   Procedure: CYSTOSCOPY WITH STENT REMOVAL;  Surgeon: Sondra Come, MD;  Location: [name] ORS;  Service: Urology;  Laterality: Bilateral;  . CYSTOSCOPY WITH BIOPSY N/A 03/15/2018   Procedure: CYSTOSCOPY WITH Bladder BIOPSY;  Surgeon: Sondra Come, MD;  Location: [facility]  ORS;  Service: Urology;  Laterality: N/A;  . CYSTOSCOPY WITH FULGERATION N/A 03/15/2018   Procedure: CYSTOSCOPY WITH FULGERATION;  Surgeon:  Sondra ComeSninsky, Brian C, MD;  Location: [name] ORS;  Service: Urology;  Laterality: N/A;  . CYSTOSCOPY WITH STENT PLACEMENT Bilateral 01/28/2018   Procedure: CYSTOSCOPY WITH STENT PLACEMENT;  Surgeon: Bjorn PippinWrenn, John, MD;  Location: [name] ORS;  Service: Urology;  Laterality: Bilateral;  . CYSTOSCOPY WITH URETEROSCOPY Bilateral 03/15/2018   Procedure: CYSTOSCOPY WITH URETEROSCOPY;  Surgeon: Sondra ComeSninsky, Brian C, MD;  Location: [name] ORS;  Service: Urology;  Laterality: Bilateral;  . SALIVARY STONE REMOVAL    . TONSILLECTOMY       FAMILY HISTORY   Family History  Problem Relation Age of Onset  . Diabetes Sister      SOCIAL HISTORY   Social History   Tobacco Use  . Smoking status: Never Smoker  . Smokeless tobacco: Never Used  Substance Use Topics  . Alcohol use: No    Frequency: Never  . Drug use: No     MEDICATIONS   Current Medication:  Current Facility-Administered Medications:  .  0.9 %  sodium chloride infusion, , Intravenous, Continuous, Tu, Ching T, DO, Last Rate: 75 mL/hr at 04/13/19 0913 .  0.9 %  sodium chloride infusion, 250 mL, Intravenous, Continuous, Jaymian Bogart, MD .  acetaminophen (TYLENOL) tablet 650 mg, 650 mg, Oral, Q6H PRN, Tu, Ching T, DO .  amiodarone (NEXTERONE PREMIX) 360-4.14 MG/200ML-% (1.8 mg/mL) IV infusion, 30 mg/hr, Intravenous, Continuous, Tu, Ching T, DO, Last Rate: 16.67 mL/hr at 04/13/19 0916, 30 mg/hr at 04/13/19 0916 .  atorvastatin (LIPITOR) tablet 20 mg, 20 mg, Oral, Daily, Hallaji, Sheema M, RPH .  ceFEPIme (MAXIPIME) 2 g in sodium chloride 0.9 % 100 mL IVPB, 2 g, Intravenous, Q24H, Hallaji, Sheema M, RPH, Last Rate: 200 mL/hr at 04/13/19 0630, 2 g at 04/13/19 0630 .  Chlorhexidine Gluconate Cloth 2 % PADS 6 each, 6 each, Topical, Daily, Vida RiggerAleskerov, Corney Knighton, MD, 6 each at 04/13/19 1511 .  famotidine (PEPCID) tablet 20 mg, 20 mg, Oral, Q24H, Tu, Ching T, DO .  [COMPLETED] heparin bolus via infusion 3,500 Units, 3,500 Units, Intravenous, Once, 3,500 Units at  04/13/19 0410 **AND** heparin ADULT infusion 100 units/mL (25000 units/25650mL sodium chloride 0.45%), 700 Units/hr, Intravenous, Continuous, Andriea Hasegawa, MD, Last Rate: 7 mL/hr at 04/13/19 1515, 700 Units/hr at 04/13/19 1515 .  metroNIDAZOLE (FLAGYL) IVPB 500 mg, 500 mg, Intravenous, Q8H, Tu, Ching T, DO, Last Rate: 100 mL/hr at 04/13/19 0946, 500 mg at 04/13/19 0946 .  norepinephrine (LEVOPHED) 4 mg in sodium chloride 0.9 % 250 mL (0.016 mg/mL) infusion, 2-10 mcg/min, Intravenous, Titrated, Akyah Lagrange, MD, Last Rate: 7.5 mL/hr at 04/13/19 1618, 2 mcg/min at 04/13/19 1618 .  phenylephrine (NEO-SYNEPHRINE) 10 mg in sodium chloride 0.9 % 250 mL (0.04 mg/mL) infusion, 0-400 mcg/min, Intravenous, Titrated, Tu, Ching T, DO, Last Rate: 120 mL/hr at 04/13/19 0945, 80 mcg/min at 04/13/19 0945 .  sodium bicarbonate 150 mEq in sterile water 1,000 mL infusion, , Intravenous, Continuous, Tu, Ching T, DO, Last Rate: 125 mL/hr at 04/13/19 1245 .  vancomycin variable dose per unstable renal function (pharmacist dosing), , Does not apply, See admin instructions, Gardner CandleHallaji, Sheema M, RPH .  vasopressin (PITRESSIN) 40 Units in sodium chloride 0.9 % 250 mL (0.16 Units/mL) infusion, 0.04 Units/min, Intravenous, Continuous, Marajade Lei, MD, Last Rate: 15 mL/hr at 04/13/19 1255, 0.04 Units/min at 04/13/19 1255    ALLERGIES   Sulfa antibiotics    REVIEW  OF SYSTEMS     Patient with mild confusion unable to provide review of systems  PHYSICAL EXAMINATION   Vital Signs: Temp:  [92.3 F (33.5 C)-97.8 F (36.6 C)] 94.3 F (34.6 C) (12/06 0641) Pulse Rate:  [31-144] 106 (12/06 0641) Resp:  [17-34] 18 (12/06 0641) BP: (68-117)/(30-72) 97/56 (12/06 0600) SpO2:  [65 %-100 %] 100 % (12/06 0641) Weight:  [59 kg] 59 kg (12/05 2130)  GENERAL: Chronically ill-appearing HEAD: Normocephalic, atraumatic.  EYES: Pupils equal, round, reactive to light.  No scleral icterus.  MOUTH: Moist mucosal membrane.  NECK: Supple. No thyromegaly. No nodules. No JVD.  PULMONARY: Bibasilar crackles CARDIOVASCULAR: S1 and S2. Regular rate and rhythm. No murmurs, rubs, or gallops.  GASTROINTESTINAL: Soft, nontender, non-distended. No masses. Positive bowel sounds. No hepatosplenomegaly.  MUSCULOSKELETAL: No swelling, clubbing, or edema.  NEUROLOGIC: Mild distress due to acute illness SKIN:intact,warm,dry   PERTINENT DATA     Infusions: . sodium chloride 75 mL/hr at 04/13/19 0913  . sodium chloride    . amiodarone 30 mg/hr (04/13/19 0916)  . ceFEPime (MAXIPIME) IV 2 g (04/13/19 0630)  . heparin 700 Units/hr (04/13/19 1515)  . metronidazole 500 mg (04/13/19 0946)  . norepinephrine (LEVOPHED) Adult infusion 2 mcg/min (04/13/19 1618)  . phenylephrine (NEO-SYNEPHRINE) Adult infusion 80 mcg/min (04/13/19 0945)  .  sodium bicarbonate (isotonic) infusion in sterile water 125 mL/hr at 04/13/19 1245  . vasopressin (PITRESSIN) infusion - *FOR SHOCK* 0.04 Units/min (04/13/19 1255)   Scheduled Medications: . atorvastatin  20 mg Oral Daily  . Chlorhexidine Gluconate Cloth  6 each Topical Daily  . famotidine  20 mg Oral Q24H  . vancomycin variable dose per unstable renal function (pharmacist dosing)   Does not apply See admin instructions   PRN Medications: acetaminophen Hemodynamic parameters:   Intake/Output: 12/05 0701 - 12/06 0700 In: 3704.6 [I.V.:804.6; IV Piggyback:2900.1] Out: -   Ventilator  Settings:      LAB RESULTS:  Basic Metabolic Panel: Recent Labs  Lab 04/12/19 2137 04/13/19 0422  NA 140 142  K 4.6 3.6  CL 107 111  CO2 <7* <7*  GLUCOSE 216* 369*  BUN 52* 44*  CREATININE 3.48* 2.90*  CALCIUM 8.3* 6.2*   Liver Function Tests: Recent Labs  Lab 04/12/19 2137  AST 31  ALT 38  ALKPHOS 92  BILITOT 1.0  PROT 6.1*  ALBUMIN 3.4*   Recent Labs  Lab 04/12/19 2137  LIPASE 39   No results for input(s): AMMONIA in the last 168 hours. CBC: Recent Labs  Lab 04/12/19  2137 04/13/19 0422  WBC 16.1* 20.0*  NEUTROABS 13.1*  --   HGB 12.8 9.2*  HCT 41.8 30.9*  MCV 113.3* 114.9*  PLT 292 218   Cardiac Enzymes: No results for input(s): CKTOTAL, CKMB, CKMBINDEX, TROPONINI in the last 168 hours. BNP: Invalid input(s): POCBNP CBG: Recent Labs  Lab 04/12/19 2327 04/13/19 0552 04/13/19 0739 04/13/19 1125 04/13/19 1536  GLUCAP 149* 204* 205* 167* 141*     IMAGING RESULTS:  Imaging: Dg Abdomen Acute W/chest  Result Date: 04/12/2019 CLINICAL DATA:  Nausea vomiting EXAM: DG ABDOMEN ACUTE W/ 1V CHEST COMPARISON:  None. FINDINGS: There is no evidence of dilated bowel loops or free intraperitoneal air. No radiopaque calculi or other significant radiographic abnormality is seen. There is mild cardiomegaly. No large airspace consolidation. Minimal nonspecific patchy airspace opacity seen within the right mid lung. IMPRESSION: Negative abdominal radiographs. Minimal patchy airspace opacity seen within the right mid lung, which is non-specific could be  due scarring and/or atelectasis. Electronically Signed   By: Jonna Clark M.D.   On: 04/12/2019 21:44      ASSESSMENT AND PLAN   Had lengthy discussion with husband Park Breed regarding goals of care, patient is currently partial code, he specifically requests no CPR/ACLS be done however he does wish to intubate due to possible reversible infection as etiology of current altered mental status with circulatory shock.  At this time patient has clinically improved and is able to saturate adequately without mechanical ventilation however we will be monitoring closely in case she deteriorates and will secure airway if necessary as requested by husband.   Acute altered mental status with lethargy -Likely due to septic encephalopathy -Treating underlying cause which is likely UTI based on urinalysis   Septic shock -Source seems to be urinary tract infection at this time patient previously on hydronephrosis with hematuria  -use vasopressors to keep MAP>65-patient has been weaned off of vasopressor support -Severe metabolic acidosis with high anion gap- -follow ABG and LA -follow up cultures -emperic ABX -consider stress dose steroids   Acute kidney injury stage III-nonoliguric -DC nonessential nephrotoxic medications -Nephrology on case-appreciate input, currently holding off on CVVH due to nonoliguric status -Strict I's and O's -Place Foley catheter as per nephrology   Atrial fibrillation with rapid ventricular response  -On amiodarone drip -On heparin infusion    ID -continue IV abx as prescibed -follow up cultures  GI/Nutrition GI PROPHYLAXIS as indicated DIET-->TF's as tolerated Constipation protocol as indicated  ENDO - ICU hypoglycemic\Hyperglycemia protocol -check FSBS per protocol   ELECTROLYTES -follow labs as needed -replace as needed -pharmacy consultation   DVT/GI PRX ordered -SCDs  TRANSFUSIONS AS NEEDED MONITOR FSBS ASSESS the need for LABS as needed   Critical care provider statement:    Critical care time (minutes):  109   Critical care time was exclusive of:  Separately billable procedures and treating other patients   Critical care was necessary to treat or prevent imminent or life-threatening deterioration of the following conditions:   Altered mental status with lethargy, circulatory shock, sepsis, UTI, atrial fibrillation with rapid ventricular response, acute kidney injury stage III, multiple comorbid conditions   Critical care was time spent personally by me on the following activities:  Development of treatment plan with patient or surrogate, discussions with consultants, evaluation of patient's response to treatment, examination of patient, obtaining history from patient or surrogate, ordering and performing treatments and interventions, ordering and review of laboratory studies and re-evaluation of patient's condition.  I assumed direction of critical care  for this patient from another provider in my specialty: no    This document was prepared using Dragon voice recognition software and may include unintentional dictation errors.    Vida Rigger, M.D.  Division of Pulmonary & Critical Care Medicine  [facility]

## 2019-04-13 NOTE — ED Notes (Signed)
Dr. Flossie Buffy notified via secure chat regarding pt's blood gas and repeat lactic acid. Order for sodium bicarb received, and to change 0.45%NS TO NS INFUSION.

## 2019-04-13 NOTE — Progress Notes (Signed)
[facility]  ROUNDING NOTE   Subjective:   [name] admitted to [facility] ICU on 04/12/2019 for Cellulitis of left lower extremity [L03.116] Atrial fibrillation with RVR (HCC) [I48.91] Severe sepsis (HCC) [A41.9, R65.20] Sepsis, due to unspecified organism, unspecified whether acute organ dysfunction present [facility](HCC) [A41.9]  Nephrology consulted for acute renal failure on chronic kidney disease stage III along with severe metabolic acidosis from lactic acidosis. Placed on phenylephrine and norepinephrine on admission. Home blood pressure medications were held. Weaned off norepinephrine.   Patient was found to be in atrial fibrillation with rapid ventricular rate, placed on amiodarone and diltiazem gtt. Weaned off dilitazem.   Broad spectrum antibiotics ordered. Recently admission last month for urinary tract infection.   Objective:  Vital signs in last 24 hours:  Temp:  [92.3 F (33.5 C)-97.8 F (36.6 C)] 94.3 F (34.6 C) (12/06 0641) Pulse Rate:  [31-144] 106 (12/06 0641) Resp:  [17-34] 18 (12/06 0641) BP: (68-117)/(30-72) 97/56 (12/06 0600) SpO2:  [65 %-100 %] 100 % (12/06 0641) Weight:  [59 kg] 59 kg (12/05 2130)  Weight change:  Filed Weights   04/12/19 2130  Weight: 59 kg    Intake/Output: I/O last 3 completed shifts: In: 3704.6 [I.V.:804.6; IV Piggyback:2900.1] Out: -    Intake/Output this shift:  No intake/output data recorded.  Physical Exam: General: Critically ill  Head: Normocephalic, atraumatic. Moist oral mucosal membranes  Eyes: Anicteric, PERRL  Neck: Supple, trachea midline  Lungs:  Clear to auscultation  Heart: Irregular, tachycardia  Abdomen:  Soft, nontender,   Extremities: + peripheral edema.  Neurologic: Not able to follow command  Skin: Left lower extremity wound in clean dressings  Access: none    Basic Metabolic Panel: Recent Labs  Lab 04/12/19 2137 04/13/19 0422  NA 140 142  K 4.6 3.6  CL 107 111  CO2 <7* <7*   GLUCOSE 216* 369*  BUN 52* 44*  CREATININE 3.48* 2.90*  CALCIUM 8.3* 6.2*    Liver Function Tests: Recent Labs  Lab 04/12/19 2137  AST 31  ALT 38  ALKPHOS 92  BILITOT 1.0  PROT 6.1*  ALBUMIN 3.4*   Recent Labs  Lab 04/12/19 2137  LIPASE 39   No results for input(s): AMMONIA in the last 168 hours.  CBC: Recent Labs  Lab 04/12/19 2137 04/13/19 0422  WBC 16.1* 20.0*  NEUTROABS 13.1*  --   HGB 12.8 9.2*  HCT 41.8 30.9*  MCV 113.3* 114.9*  PLT 292 218    Cardiac Enzymes: No results for input(s): CKTOTAL, CKMB, CKMBINDEX, TROPONINI in the last 168 hours.  BNP: Invalid input(s): POCBNP  CBG: Recent Labs  Lab 04/12/19 2327 04/13/19 0552 04/13/19 0739  GLUCAP 149* 204* 205*    Microbiology: Results for orders placed or performed during the hospital encounter of 04/12/19  SARS CORONAVIRUS 2 (TAT 6-24 HRS) Nasopharyngeal Nasopharyngeal Swab     Status: None   Collection Time: 04/12/19  9:30 PM   Specimen: Nasopharyngeal Swab  Result Value Ref Range Status   SARS Coronavirus 2 NEGATIVE NEGATIVE Final    Comment: (NOTE) SARS-CoV-2 target nucleic acids are NOT DETECTED. The SARS-CoV-2 RNA is generally detectable in upper and lower respiratory specimens during the acute phase of infection. Negative results do not preclude SARS-CoV-2 infection, do not rule out co-infections with other pathogens, and should not be used as the sole basis for treatment or other patient management decisions. Negative results must be combined with clinical observations, patient history, and epidemiological information.  The expected result is Negative. Fact Sheet for Patients: SugarRoll.be Fact Sheet for Healthcare Providers: https://www.woods-mathews.com/ This test is not yet approved or cleared by the [place] FDA and  has been authorized for detection and/or diagnosis of SARS-CoV-2 by FDA under an Emergency Use Authorization (EUA).  This EUA will remain  in effect (meaning this test can be used) for the duration of the COVID-19 declaration under Section 56 4(b)(1) of the Act, 21 U.S.C. section 360bbb-3(b)(1), unless the authorization is terminated or revoked sooner. Performed at [facility], [place] [address] [place], [place] [address]   Blood Culture (routine x 2)     Status: None (Preliminary result)   Collection Time: 04/12/19  9:38 PM   Specimen: BLOOD  Result Value Ref Range Status   Specimen Description BLOOD Blood Culture adequate volume  Final   Special Requests   Final    BOTTLES DRAWN AEROBIC AND ANAEROBIC LEFT ANTECUBITAL   Culture   Final    NO GROWTH < 12 HOURS Performed at [facility], [address], [place], [place] [address]    Report Status PENDING  Incomplete  Blood Culture (routine x 2)     Status: None (Preliminary result)   Collection Time: 04/12/19  9:38 PM   Specimen: BLOOD  Result Value Ref Range Status   Specimen Description BLOOD Blood Culture adequate volume  Final   Special Requests   Final    BOTTLES DRAWN AEROBIC AND ANAEROBIC RIGHT ANTECUBITAL   Culture   Final    NO GROWTH < 12 HOURS Performed at [facility], [address], [place], [place] [address]    Report Status PENDING  Incomplete  MRSA PCR Screening     Status: None   Collection Time: 04/13/19  6:11 AM   Specimen: Nasopharyngeal  Result Value Ref Range Status   MRSA by PCR NEGATIVE NEGATIVE Final    Comment:        The GeneXpert MRSA Assay (FDA approved for NASAL specimens only), is one component of a comprehensive MRSA colonization surveillance program. It is not intended to diagnose MRSA infection nor to guide or monitor treatment for MRSA infections. Performed at [facility], [address] [place], [place] [address]     Coagulation Studies: Recent Labs    04/12/19 09/06/35  LABPROT 13.8  INR 1.1    Urinalysis: Recent Labs    04/13/19 0000  COLORURINE  YELLOW*  LABSPEC 1.024  PHURINE 5.0  GLUCOSEU NEGATIVE  HGBUR LARGE*  BILIRUBINUR NEGATIVE  KETONESUR NEGATIVE  PROTEINUR 100*  NITRITE NEGATIVE  LEUKOCYTESUR LARGE*      Imaging: Dg Abdomen Acute W/chest  Result Date: 04/12/2019 CLINICAL DATA:  Nausea vomiting EXAM: DG ABDOMEN ACUTE W/ 1V CHEST COMPARISON:  None. FINDINGS: There is no evidence of dilated bowel loops or free intraperitoneal air. No radiopaque calculi or other significant radiographic abnormality is seen. There is mild cardiomegaly. No large airspace consolidation. Minimal nonspecific patchy airspace opacity seen within the right mid lung. IMPRESSION: Negative abdominal radiographs. Minimal patchy airspace opacity seen within the right mid lung, which is non-specific could be due scarring and/or atelectasis. Electronically Signed   By: Prudencio Pair M.D.   On: 04/12/2019 21:44     Medications:   . sodium chloride 75 mL/hr at 04/13/19 0913  . amiodarone 30 mg/hr (04/13/19 0916)  . ceFEPime (MAXIPIME) IV 2 g (04/13/19 0630)  . heparin 800 Units/hr (04/13/19 0700)  . metronidazole 500 mg (04/13/19 0946)  .  phenylephrine (NEO-SYNEPHRINE) Adult infusion 80 mcg/min (04/13/19 0945)  .  sodium bicarbonate (isotonic) infusion in sterile water 125 mL/hr at 04/13/19 0700   . atorvastatin  20 mg Oral Daily  . famotidine  20 mg Oral Q24H  . vancomycin variable dose per unstable renal function (pharmacist dosing)   Does not apply See admin instructions   acetaminophen  Assessment/ Plan:  Tiffany Manning is a 78 y.o. white female with diabetes mellitus type II, hypertension, hyperlipidemia, chronic urinary obstruction who is admitted to [facility] on 04/12/2019 for Cellulitis of left lower extremity [L03.116] Atrial fibrillation with RVR (HCC) [I48.91] Severe sepsis (HCC) [A41.9, R65.20] Sepsis, due to unspecified organism, unspecified whether acute organ dysfunction present (HCC) [A41.9]  1. 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  Anuric urine output - Discussed prognosis with patient's husband - Patient will need renal replacement therapy. Consult ICU for line placement.   2. Sepsis: with infection of wound and urinary tract infection.  Requiring vasopressors.  Broad spectrum antibiotics.   3. Anemia with chronic kidney disease: hemoglobin 9.2   LOS: 1 Torrin Crihfield 12/6/202011:23 AM

## 2019-04-13 NOTE — ED Notes (Signed)
Dr. Flossie Buffy notified regarding pt's continued tachycardia 120s-130 with hypotension, map 56. Per dr. Flossie Buffy, do not make any modifications to cardiazem drip at this time.

## 2019-04-13 NOTE — ED Provider Notes (Signed)
Reassessment completed.  Patient blood pressure has improved to 110 at this time, heart rate is also improving with rate control primarily in the 1 10-1 20 range.  She does appear more comfortable.  Capillary refill remains slowed.  Overall appears to be improving.   Patient work of breathing has improved slightly, she is alert, very hard of hearing but does alert.  Awaiting ABG, been seen and admitted down to the hospitalist service for concerns of severe possible sepsis septic shock   Delman Kitten, MD 04/13/19 0007

## 2019-04-13 NOTE — Progress Notes (Signed)
PHARMACIST - PHYSICIAN ORDER COMMUNICATION  CONCERNING: Diltiazem and Simvastatin  and risk of rhabdomyolysis  DESCRIPTION:  Patients on diltiazem and simvastatin >10 mg/day have reported cases of rhabdomyolysis. Pharmacy is to assess simvastatin dose. If >10 mg, substitute atorvastatin (Lipitor) 1mg  for each 2mg  simvastatin.  This patient is ordered simvastatin 40mg  and diltiazem infusion    ACTION TAKEN: Per protocol pharmacy has discontinued the patient's order for simvastatin and replaced it with Atorvastatin 20mg .   Pernell Dupre, PharmD, BCPS Clinical Pharmacist 04/13/2019 12:12 AM

## 2019-04-14 ENCOUNTER — Inpatient Hospital Stay: Payer: Self-pay

## 2019-04-14 DIAGNOSIS — I4891 Unspecified atrial fibrillation: Secondary | ICD-10-CM

## 2019-04-14 DIAGNOSIS — A419 Sepsis, unspecified organism: Principal | ICD-10-CM

## 2019-04-14 DIAGNOSIS — R627 Adult failure to thrive: Secondary | ICD-10-CM

## 2019-04-14 DIAGNOSIS — N390 Urinary tract infection, site not specified: Secondary | ICD-10-CM | POA: Diagnosis not present

## 2019-04-14 DIAGNOSIS — R6520 Severe sepsis without septic shock: Secondary | ICD-10-CM

## 2019-04-14 DIAGNOSIS — E43 Unspecified severe protein-calorie malnutrition: Secondary | ICD-10-CM

## 2019-04-14 DIAGNOSIS — N179 Acute kidney failure, unspecified: Secondary | ICD-10-CM

## 2019-04-14 LAB — LACTIC ACID, PLASMA: Lactic Acid, Venous: 2.5 mmol/L (ref 0.5–1.9)

## 2019-04-14 LAB — BLOOD GAS, ARTERIAL
Acid-base deficit: 17.5 mmol/L — ABNORMAL HIGH (ref 0.0–2.0)
Bicarbonate: 5.9 mmol/L — ABNORMAL LOW (ref 20.0–28.0)
FIO2: 0.28
O2 Saturation: 99.2 %
Patient temperature: 37
pCO2 arterial: <19 mmHg — CL (ref 32.0–48.0)
pH, Arterial: 7.3 — ABNORMAL LOW (ref 7.350–7.450)
pO2, Arterial: 153 mmHg — ABNORMAL HIGH (ref 83.0–108.0)

## 2019-04-14 LAB — BASIC METABOLIC PANEL
Anion gap: 26 — ABNORMAL HIGH (ref 5–15)
BUN: 44 mg/dL — ABNORMAL HIGH (ref 8–23)
CO2: 14 mmol/L — ABNORMAL LOW (ref 22–32)
Calcium: 6.4 mg/dL — CL (ref 8.9–10.3)
Chloride: 104 mmol/L (ref 98–111)
Creatinine, Ser: 3.32 mg/dL — ABNORMAL HIGH (ref 0.44–1.00)
GFR calc Af Amer: 15 mL/min — ABNORMAL LOW (ref >60)
GFR calc non Af Amer: 13 mL/min — ABNORMAL LOW (ref >60)
Glucose, Bld: 247 mg/dL — ABNORMAL HIGH (ref 70–99)
Potassium: 3.2 mmol/L — ABNORMAL LOW (ref 3.5–5.1)
Sodium: 144 mmol/L (ref 135–145)

## 2019-04-14 LAB — GLUCOSE, CAPILLARY
Glucose-Capillary: 193 mg/dL — ABNORMAL HIGH (ref 70–99)
Glucose-Capillary: 193 mg/dL — ABNORMAL HIGH (ref 70–99)
Glucose-Capillary: 199 mg/dL — ABNORMAL HIGH (ref 70–99)
Glucose-Capillary: 199 mg/dL — ABNORMAL HIGH (ref 70–99)
Glucose-Capillary: 201 mg/dL — ABNORMAL HIGH (ref 70–99)
Glucose-Capillary: 245 mg/dL — ABNORMAL HIGH (ref 70–99)
Glucose-Capillary: 246 mg/dL — ABNORMAL HIGH (ref 70–99)

## 2019-04-14 LAB — VANCOMYCIN, RANDOM: Vancomycin Rm: 15

## 2019-04-14 LAB — CBC
HCT: 30.7 % — ABNORMAL LOW (ref 36.0–46.0)
Hemoglobin: 9.4 g/dL — ABNORMAL LOW (ref 12.0–15.0)
MCH: 33.9 pg (ref 26.0–34.0)
MCHC: 30.6 g/dL (ref 30.0–36.0)
MCV: 110.8 fL — ABNORMAL HIGH (ref 80.0–100.0)
Platelets: 170 10*3/uL (ref 150–400)
RBC: 2.77 MIL/uL — ABNORMAL LOW (ref 3.87–5.11)
RDW: 15.4 % (ref 11.5–15.5)
WBC: 18.6 10*3/uL — ABNORMAL HIGH (ref 4.0–10.5)
nRBC: 1.6 % — ABNORMAL HIGH (ref 0.0–0.2)

## 2019-04-14 LAB — HEPARIN LEVEL (UNFRACTIONATED)
Heparin Unfractionated: 0.44 IU/mL (ref 0.30–0.70)
Heparin Unfractionated: 0.49 IU/mL (ref 0.30–0.70)
Heparin Unfractionated: 0.93 IU/mL — ABNORMAL HIGH (ref 0.30–0.70)

## 2019-04-14 LAB — MAGNESIUM: Magnesium: 1.7 mg/dL (ref 1.7–2.4)

## 2019-04-14 LAB — PHOSPHORUS: Phosphorus: 5.1 mg/dL — ABNORMAL HIGH (ref 2.5–4.6)

## 2019-04-14 MED ORDER — POTASSIUM CHLORIDE 10 MEQ/100ML IV SOLN
10.0000 meq | INTRAVENOUS | Status: AC
  Administered 2019-04-14 (×4): 10 meq via INTRAVENOUS
  Filled 2019-04-14 (×4): qty 100

## 2019-04-14 MED ORDER — VASOPRESSIN 20 UNIT/ML IV SOLN
0.0300 [IU]/min | INTRAVENOUS | Status: DC
  Filled 2019-04-14 (×2): qty 2

## 2019-04-14 MED ORDER — MAGNESIUM SULFATE 2 GM/50ML IV SOLN
2.0000 g | Freq: Once | INTRAVENOUS | Status: AC
  Administered 2019-04-14: 2 g via INTRAVENOUS
  Filled 2019-04-14: qty 50

## 2019-04-14 MED ORDER — LACTATED RINGERS IV BOLUS
1000.0000 mL | Freq: Once | INTRAVENOUS | Status: AC
  Administered 2019-04-14: 1000 mL via INTRAVENOUS

## 2019-04-14 MED ORDER — LACTATED RINGERS IV BOLUS
500.0000 mL | Freq: Once | INTRAVENOUS | Status: AC
  Administered 2019-04-14: 500 mL via INTRAVENOUS

## 2019-04-14 MED ORDER — NOREPINEPHRINE 4 MG/250ML-% IV SOLN
0.0000 ug/min | INTRAVENOUS | Status: DC
  Administered 2019-04-14: 20 ug/min via INTRAVENOUS
  Administered 2019-04-14: 3 ug/min via INTRAVENOUS
  Administered 2019-04-15: 2 ug/min via INTRAVENOUS
  Filled 2019-04-14 (×2): qty 4

## 2019-04-14 MED ORDER — VANCOMYCIN HCL 500 MG IV SOLR
500.0000 mg | Freq: Once | INTRAVENOUS | Status: AC
  Administered 2019-04-14: 04:00:00 500 mg via INTRAVENOUS
  Filled 2019-04-14: qty 500

## 2019-04-14 MED ORDER — SODIUM BICARBONATE 8.4 % IV SOLN
100.0000 meq | Freq: Once | INTRAVENOUS | Status: AC
  Administered 2019-04-14: 100 meq via INTRAVENOUS
  Filled 2019-04-14: qty 100

## 2019-04-14 NOTE — Progress Notes (Addendum)
Follow up - Critical Care Medicine Note  Patient Details:    Tiffany Manning is an 78 y.o. female with dementia, adult failure to thrive, severe protein calorie malnutrition and recurrent bouts of hematuria.  Admitted with severe sepsis and septic shock.  Patient encephalopathic.  Lines, Airways, Drains: Urethral Catheter Charna Elizabeth, RN Double-lumen;Latex 14 Fr. (Active)  Indication for Insertion or Continuance of Catheter Acute urinary retention (I&O Cath for 24 hrs prior to catheter insertion- Inpatient Only) 04/14/19 0800  Site Assessment Intact 04/14/19 0800  Catheter Maintenance Bag emptied prior to transport;Seal intact;No dependent loops;Insertion date on drainage bag;Drainage bag/tubing not touching floor;Catheter secured;Bag below level of bladder 04/14/19 0800  Collection Container Standard drainage bag 04/14/19 0800  Securement Method Securing device (Describe) 04/14/19 0800  Urinary Catheter Interventions (if applicable) Unclamped 04/14/19 0400  Output (mL) 150 mL 04/14/19 1228    Anti-infectives:  Anti-infectives (From admission, onward)   Start     Dose/Rate Route Frequency Ordered Stop   04/14/19 0200  vancomycin (VANCOCIN) 500 mg in sodium chloride 0.9 % 100 mL IVPB     500 mg 100 mL/hr over 60 Minutes Intravenous  Once 04/14/19 0114 04/14/19 0433   04/13/19 0600  ceFEPIme (MAXIPIME) 2 g in sodium chloride 0.9 % 100 mL IVPB     2 g 200 mL/hr over 30 Minutes Intravenous Every 24 hours 04/13/19 0203     04/13/19 0230  metroNIDAZOLE (FLAGYL) IVPB 500 mg     500 mg 100 mL/hr over 60 Minutes Intravenous Every 8 hours 04/13/19 0201     04/13/19 0203  vancomycin variable dose per unstable renal function (pharmacist dosing)      Does not apply See admin instructions 04/13/19 0203     04/12/19 2315  vancomycin (VANCOCIN) 1,250 mg in sodium chloride 0.9 % 250 mL IVPB     1,250 mg 166.7 mL/hr over 90 Minutes Intravenous  Once 04/12/19 2306 04/13/19 0047   04/12/19 2145   piperacillin-tazobactam (ZOSYN) IVPB 3.375 g     3.375 g 100 mL/hr over 30 Minutes Intravenous  Once 04/12/19 2136 04/12/19 2241      Microbiology: Results for orders placed or performed during the hospital encounter of 04/12/19  SARS CORONAVIRUS 2 (TAT 6-24 HRS) Nasopharyngeal Nasopharyngeal Swab     Status: None   Collection Time: 04/12/19  9:30 PM   Specimen: Nasopharyngeal Swab  Result Value Ref Range Status   SARS Coronavirus 2 NEGATIVE NEGATIVE Final    Comment: (NOTE) SARS-CoV-2 target nucleic acids are NOT DETECTED. The SARS-CoV-2 RNA is generally detectable in upper and lower respiratory specimens during the acute phase of infection. Negative results do not preclude SARS-CoV-2 infection, do not rule out co-infections with other pathogens, and should not be used as the sole basis for treatment or other patient management decisions. Negative results must be combined with clinical observations, patient history, and epidemiological information. The expected result is Negative. Fact Sheet for Patients: HairSlick.no Fact Sheet for Healthcare Providers: quierodirigir.com This test is not yet approved or cleared by the [place] FDA and  has been authorized for detection and/or diagnosis of SARS-CoV-2 by FDA under an Emergency Use Authorization (EUA). This EUA will remain  in effect (meaning this test can be used) for the duration of the COVID-19 declaration under Section 56 4(b)(1) of the Act, 21 U.S.C. section 360bbb-3(b)(1), unless the authorization is terminated or revoked sooner. Performed at [facility], [address] [place], [place] [address]   Blood  Culture (routine x 2)     Status: None (Preliminary result)   Collection Time: 04/12/19  9:38 PM   Specimen: BLOOD  Result Value Ref Range Status   Specimen Description BLOOD Blood Culture adequate volume  Final   Special Requests   Final    BOTTLES  DRAWN AEROBIC AND ANAEROBIC LEFT ANTECUBITAL   Culture   Final    NO GROWTH 2 DAYS Performed at [facility], [address] [place], [place] [address]    Report Status PENDING  Incomplete  Blood Culture (routine x 2)     Status: None (Preliminary result)   Collection Time: 04/12/19  9:38 PM   Specimen: BLOOD  Result Value Ref Range Status   Specimen Description BLOOD Blood Culture adequate volume  Final   Special Requests   Final    BOTTLES DRAWN AEROBIC AND ANAEROBIC RIGHT ANTECUBITAL   Culture   Final    NO GROWTH 2 DAYS Performed at [facility], [address] [place], [place] [address]    Report Status PENDING  Incomplete  MRSA PCR Screening     Status: None   Collection Time: 04/13/19  6:11 AM   Specimen: Nasopharyngeal  Result Value Ref Range Status   MRSA by PCR NEGATIVE NEGATIVE Final    Comment:        The GeneXpert MRSA Assay (FDA approved for NASAL specimens only), is one component of a comprehensive MRSA colonization surveillance program. It is not intended to diagnose MRSA infection nor to guide or monitor treatment for MRSA infections. Performed at [facility], [address] [place], [place] [address]     Best Practice/Protocols:  VTE Prophylaxis: Heparin (SQ) GI Prophylaxis: Antihistamine   Events:   Studies: Dg Chest Port 1 View  Result Date: 04/13/2019 CLINICAL DATA:  78 year old female with lung disease. EXAM: PORTABLE CHEST 1 VIEW COMPARISON:  Chest radiograph dated 02/05/2018. FINDINGS: Shallow inspiration with minimal bibasilar atelectasis. No focal consolidation, pleural effusion, or pneumothorax. A 2 cm nodular density in the left upper lobe was seen on the prior radiographs and corresponds to the nodule seen on the neck CT dated 05/28/2017. Top-normal cardiac silhouette. Atherosclerotic calcification of the aortic arch. No acute osseous pathology. IMPRESSION: 1. No acute cardiopulmonary process. 2. A 2 cm  nodular density in the left upper lobe present dating back to 05/28/2017. Electronically Signed   By: Elgie CollardArash  Radparvar M.D.   On: 04/13/2019 18:52   Dg Abdomen Acute W/chest  Result Date: 04/12/2019 CLINICAL DATA:  Nausea vomiting EXAM: DG ABDOMEN ACUTE W/ 1V CHEST COMPARISON:  None. FINDINGS: There is no evidence of dilated bowel loops or free intraperitoneal air. No radiopaque calculi or other significant radiographic abnormality is seen. There is mild cardiomegaly. No large airspace consolidation. Minimal nonspecific patchy airspace opacity seen within the right mid lung. IMPRESSION: Negative abdominal radiographs. Minimal patchy airspace opacity seen within the right mid lung, which is non-specific could be due scarring and/or atelectasis. Electronically Signed   By: Jonna ClarkBindu  Avutu M.D.   On: 04/12/2019 21:44   Ct Renal Stone Study  Result Date: 03/24/2019 CLINICAL DATA:  Flank pain EXAM: CT ABDOMEN AND PELVIS WITHOUT CONTRAST TECHNIQUE: Multidetector CT imaging of the abdomen and pelvis was performed following the standard protocol without IV contrast. COMPARISON:  Ultrasound 12/24/2018, CT 01/28/2018, 09/19/2011 FINDINGS: Lower chest: Stable nodularity in the right middle lobe. 5 mm right middle lobe pulmonary nodule, decreased compared to prior measurement of 7 mm. No acute consolidation  or effusion. Coronary vascular calcification. Heart size within normal limits. Small hiatal hernia. Hepatobiliary: No focal hepatic abnormality. Status post cholecystectomy. No significant biliary dilatation Pancreas: No acute inflammatory changes. Pancreas is atrophic and contains a few parenchymal calcifications. Fifteen mm hypodense nodule adjacent to or exophytic to the pancreas body, stable to slight increase in size. Spleen: Normal in size without focal abnormality. Adrenals/Urinary Tract: Adrenal glands are normal. Lobulated atrophic left kidney. Moderate left and mild right hydronephrosis, decreased compared to  prior study. Left hydroureter with stranding about the distal left ureter. No stones. Mild increased density within the posterior bladder. Small cyst lower pole left kidney. Stomach/Bowel: The stomach is nonenlarged. No dilated small bowel. Collapsed appearance of transverse colon. Possible wall thickening of the ascending colon. Vascular/Lymphatic: Moderate aortic atherosclerosis without aneurysm. No significantly enlarged lymph nodes. Reproductive: Status post hysterectomy. No adnexal masses. Other: Negative for free air or free fluid. Moderate fat containing infraumbilical hernia. Musculoskeletal: No acute or suspicious osseous abnormality. Similar scattered foci of sclerosis within the hips and pelvis. IMPRESSION: 1. Moderate left and mild right hydronephrosis with left hydroureter, though decreased compared to the prior CT. No ureteral stone is seen. Left ureter is dilated down to the level of the bladder. Cortical scarring and atrophy of the left kidney. 2. Mild increased density at the posterior bladder, possible small stones. 3. 15 mm hypodense nodule possibly arising from the pancreatic body. Nonemergent MRI recommended for further evaluation given increasing size as compared with prior exams. 4. 5 mm right middle lobe pulmonary nodule, decreased in size 5. Possible wall thickening of the ascending colon though without significant surrounding inflammatory change. Correlation with colonoscopy suggested to exclude mass in the region. 6. Moderate fat containing infraumbilical ventral hernia Electronically Signed   By: Donavan Foil M.D.   On: 03/24/2019 21:31   [place] Ekg Site Rite  Result Date: 04/14/2019 If Site Rite image not attached, placement could not be confirmed due to current cardiac rhythm.   Consults: Treatment Team:  Lavonia Dana, MD Ottie Glazier, MD [facility], Palliative   Subjective:    Overnight Issues: Issues with poor IV access.  Still pressor dependent.  Severe anasarca.   Continues to have issues with encephalopathy.  Objective:  Vital signs for last 24 hours: Temp:  [97.7 F (36.5 C)-99.7 F (37.6 C)] 98.9 F (37.2 C) (12/07 1200) Pulse Rate:  [40-118] 72 (12/07 1500) Resp:  [14-26] 18 (12/07 1500) BP: (79-180)/(45-118) 82/51 (12/07 1500) SpO2:  [84 %-100 %] 95 % (12/07 1500)  Hemodynamic parameters for last 24 hours:    Intake/Output from previous day: 12/06 0701 - 12/07 0700 In: 2884.5 [I.V.:2684.5; IV Piggyback:200] Out: 425 [Urine:425]  Intake/Output this shift: Total I/O In: 172.8 [I.V.:172.8] Out: 260 [Urine:260]  Vent settings for last 24 hours:    Physical Exam:  GENERAL: Chronically ill-appearing, frail HEAD: Normocephalic, atraumatic.  EYES: Pupils equal, round, reactive to light.  No scleral icterus.  MOUTH: Poor dentition, oral mucosa moist NECK: Supple.  Trachea midline no JVD.  PULMONARY: Bibasilar crackles, coarse breath sounds CARDIOVASCULAR: Irregular rate and rhythm, controlled ventricular response at present GASTROINTESTINAL: Soft, nontender, non-distended.  Bowel sounds present MUSCULOSKELETAL: 3+ anasarca, muscle wasting NEUROLOGIC: Lethargic, mumbles nonsensically. SKIN:i pressure wounds on lower extremities, fragile skin with weeping due to anasarca  Assessment/Plan:  Encephalopathy Dementia Toxic metabolic encephalopathy superimposed on dementia Encephalopathy likely due to severe sepsis/septic shock Sepsis likely from urinary tract source Continue antibiotics  Dysphagia Recurrent aspiration Husband notes that she has been  having "choking" episodes over the last year and a half Nursing witnessed aspiration of her own secretions NPO. for now, swallow eval  Adult Failure to thrive Severe protein calorie malnutrition Poor prognosis due to ongoing adult failure to thrive Assess swallowing eval for supplementation and nutrition  Afib with RVR Continue amiodarone for now Correct electrolytes  Acute on  chronic kidney injury Not a dialysis candidate Discussed with Dr. Thedore Mins  ID Cellulitis UTI Continue antibiotics Continue wound care Wound care challenging due to poor nutrition    LOS: 2 days   Additional comments: Discussed during multidisciplinary rounds.  Discussed with patient's husband.  Patient's prognosis is exceedingly poor.  She is now DNR/DNI, will obtain palliative care consult, anticipate transitioning to comfort care/hospice  Critical Care Total Time*:  C. Danice Goltz, MD [facility] 04/14/2019  *Care during the described time interval was provided by me and/or other providers on the critical care team.  I have reviewed this patient's available data, including medical history, events of note, physical examination and test results as part of my evaluation.  **This note was dictated using voice recognition software/Dragon.  Despite best efforts to proofread, errors can occur which can change the meaning.  Any change was purely unintentional.

## 2019-04-14 NOTE — Progress Notes (Signed)
PICC RN assessed patient for PICC Placement.  Patient right arm edematous with numerous skin tears, vessels very small and constricted.  Patient left arm edematous with peripheral IV sites oozing blood, vessels very small and constricted on left arm. Both arms are very cold to touch.  PICC RN messaged Dr Patsey Berthold of PICC assessment findings.  Dr Patsey Berthold informed that patient would be transitioning to comfort care.  Unit Charge RN also aware of PICC assessment

## 2019-04-14 NOTE — Consult Note (Signed)
PHARMACY CONSULT NOTE - FOLLOW UP  Pharmacy Consult for Electrolyte Monitoring and Replacement   Recent Labs: Potassium (mmol/L)  Date Value  04/14/2019 3.2 (L)  10/08/2013 3.8   Magnesium (mg/dL)  Date Value  04/14/2019 1.7  10/06/2013 1.5 (L)   Calcium (mg/dL)  Date Value  04/14/2019 6.4 (LL)   Calcium, Total (mg/dL)  Date Value  10/08/2013 7.9 (L)   Albumin (g/dL)  Date Value  04/12/2019 3.4 (L)  10/06/2013 3.3 (L)   Phosphorus (mg/dL)  Date Value  04/14/2019 5.1 (H)   Sodium (mmol/L)  Date Value  04/14/2019 144  10/08/2013 22    78 year old female admitted with sepsis secondary to wound cellulitis, as well as acute renal failure.  Patient is currently not handling PO medications very well per nurse.  Currently on amiodarone.   Assessment: K 3.2 Magnesium 1.7  Goal of Therapy:  K 3.5 Mag wnl  Plan:  Start KCl 10 mEq Q1 hour x 4 doses Give mag 2 mg IV x 1 dose Pharmacy will check labs in the AM.  Gerald Dexter, PharmD Pharmacy Resident  04/14/2019 9:57 AM

## 2019-04-14 NOTE — Progress Notes (Addendum)
Patient remains confused and demented. Follows simple commands but only groans or answers yes or no. Periods of apnea noted. Not alert enough to swallow. Husband has called x 1 to check on patient.1800 Blood return obtained in all IV sites every 2 hours all day.

## 2019-04-14 NOTE — Progress Notes (Signed)
[facility] [place], [place] 04/14/19  Subjective:  Admitted with sepsis secondary to wound cellulitis and possible intraabdominal infection, diarrhea, weakness, decreased oral intake  Neuro- lethargic but arousable, did not follow commands. Nursing staff report h/o dementia cvs- a fib, requiring pressors, amiodarone drip, generalized edema pulm- [place] O2 Gi- appears to have poor oral intake  Renal- 12/06 0701 - 12/07 0700 In: 2884.5 [I.V.:2684.5; IV Piggyback:200] Out: 425 [Urine:425]] Lab Results  Component Value Date   CREATININE 3.32 (H) 04/14/2019   CREATININE 2.90 (H) 04/13/2019   CREATININE 3.48 (H) 04/12/2019   Iv bicarb infusion    Hospital day: 2   Objective:  Vital signs in last 24 hours:  Temp:  [95.9 F (35.5 C)-99.7 F (37.6 C)] 99.7 F (37.6 C) (12/07 0500) Pulse Rate:  [40-133] 91 (12/07 0500) Resp:  [14-28] 16 (12/07 0500) BP: (79-158)/(43-122) 109/63 (12/07 0500) SpO2:  [82 %-100 %] 90 % (12/07 0500)  Weight change:  Filed Weights   04/12/19 2130  Weight: 59 kg    Intake/Output:    Intake/Output Summary (Last 24 hours) at 04/14/2019 0814 Last data filed at 04/14/2019 0500 Gross per 24 hour  Intake 2884.46 ml  Output 425 ml  Net 2459.46 ml     Physical Exam: General: Chronically ill appearing  HEENT Anicteric, dry oral mucus membraned  Pulm/lungs [place] O2, coarse breath sounds  CVS/Heart Irregular, A fib  Abdomen:  soft  Extremities: Large area of cellulitis on rt calf, ++  Generalized edema  Neurologic: Arousable, moaning, did not follow commands  Skin: Nursing report bruising in perineal area  Access:    Foley in place    Basic Metabolic Panel:  Recent Labs  Lab 04/12/19 2137 04/13/19 0422 04/14/19 0430  NA 140 142 144  K 4.6 3.6 3.2*  CL 107 111 104  CO2 <7* <7* 14*  GLUCOSE 216* 369* 247*  BUN 52* 44* 44*  CREATININE 3.48* 2.90* 3.32*  CALCIUM 8.3* 6.2* 6.4*  MG  --   --  1.7  PHOS  --   --  5.1*      CBC: Recent Labs  Lab 04/12/19 2137 04/13/19 0422 04/14/19 0430  WBC 16.1* 20.0* 18.6*  NEUTROABS 13.1*  --   --   HGB 12.8 9.2* 9.4*  HCT 41.8 30.9* 30.7*  MCV 113.3* 114.9* 110.8*  PLT 292 218 170     No results found for: HEPBSAG, HEPBSAB, HEPBIGM    Microbiology:  Recent Results (from the past 240 hour(s))  SARS CORONAVIRUS 2 (TAT 6-24 HRS) Nasopharyngeal Nasopharyngeal Swab     Status: None   Collection Time: 04/12/19  9:30 PM   Specimen: Nasopharyngeal Swab  Result Value Ref Range Status   SARS Coronavirus 2 NEGATIVE NEGATIVE Final    Comment: (NOTE) SARS-CoV-2 target nucleic acids are NOT DETECTED. The SARS-CoV-2 RNA is generally detectable in upper and lower respiratory specimens during the acute phase of infection. Negative results do not preclude SARS-CoV-2 infection, do not rule out co-infections with other pathogens, and should not be used as the sole basis for treatment or other patient management decisions. Negative results must be combined with clinical observations, patient history, and epidemiological information. The expected result is Negative. Fact Sheet for Patients: HairSlick.nohttps://www.fda.gov/media/138098/download Fact Sheet for Healthcare Providers: quierodirigir.comhttps://www.fda.gov/media/138095/download This test is not yet approved or cleared by the [place] FDA and  has been authorized for detection and/or diagnosis of SARS-CoV-2 by FDA under an Emergency Use Authorization (EUA). This EUA will remain  in effect (meaning this test can be used) for the duration of the COVID-19 declaration under Section 56 4(b)(1) of the Act, 21 U.S.C. section 360bbb-3(b)(1), unless the authorization is terminated or revoked sooner. Performed at [facility], [place] [address], [place], [place] [address]   Blood Culture (routine x 2)     Status: None (Preliminary result)   Collection Time: 04/12/19  9:38 PM   Specimen: BLOOD  Result Value Ref Range Status    Specimen Description BLOOD Blood Culture adequate volume  Final   Special Requests   Final    BOTTLES DRAWN AEROBIC AND ANAEROBIC LEFT ANTECUBITAL   Culture   Final    NO GROWTH 2 DAYS Performed at [facility], [address], [place], [place] [address]    Report Status PENDING  Incomplete  Blood Culture (routine x 2)     Status: None (Preliminary result)   Collection Time: 04/12/19  9:38 PM   Specimen: BLOOD  Result Value Ref Range Status   Specimen Description BLOOD Blood Culture adequate volume  Final   Special Requests   Final    BOTTLES DRAWN AEROBIC AND ANAEROBIC RIGHT ANTECUBITAL   Culture   Final    NO GROWTH 2 DAYS Performed at [facility], [address] [place], [place] [address]    Report Status PENDING  Incomplete  MRSA PCR Screening     Status: None   Collection Time: 04/13/19  6:11 AM   Specimen: Nasopharyngeal  Result Value Ref Range Status   MRSA by PCR NEGATIVE NEGATIVE Final    Comment:        The GeneXpert MRSA Assay (FDA approved for NASAL specimens only), is one component of a comprehensive MRSA colonization surveillance program. It is not intended to diagnose MRSA infection nor to guide or monitor treatment for MRSA infections. Performed at [facility], [address] [place], [place] [address]     Coagulation Studies: Recent Labs    04/12/19 2135-09-12  LABPROT 13.8  INR 1.1    Urinalysis: Recent Labs    04/13/19 0000  COLORURINE YELLOW*  LABSPEC 1.024  PHURINE 5.0  GLUCOSEU NEGATIVE  HGBUR LARGE*  BILIRUBINUR NEGATIVE  KETONESUR NEGATIVE  PROTEINUR 100*  NITRITE NEGATIVE  LEUKOCYTESUR LARGE*      Imaging: Dg Chest Port 1 View  Result Date: 04/13/2019 CLINICAL DATA:  78 year old female with lung disease. EXAM: PORTABLE CHEST 1 VIEW COMPARISON:  Chest radiograph dated 02/05/2018. FINDINGS: Shallow inspiration with minimal bibasilar atelectasis. No focal consolidation, pleural effusion, or  pneumothorax. A 2 cm nodular density in the left upper lobe was seen on the prior radiographs and corresponds to the nodule seen on the neck CT dated 05/28/2017. Top-normal cardiac silhouette. Atherosclerotic calcification of the aortic arch. No acute osseous pathology. IMPRESSION: 1. No acute cardiopulmonary process. 2. A 2 cm nodular density in the left upper lobe present dating back to 05/28/2017. Electronically Signed   By: Anner Crete M.D.   On: 04/13/2019 18:52   Dg Abdomen Acute W/chest  Result Date: 04/12/2019 CLINICAL DATA:  Nausea vomiting EXAM: DG ABDOMEN ACUTE W/ 1V CHEST COMPARISON:  None. FINDINGS: There is no evidence of dilated bowel loops or free intraperitoneal air. No radiopaque calculi or other significant radiographic abnormality is seen. There is mild cardiomegaly. No large airspace consolidation. Minimal nonspecific patchy airspace opacity seen within the right mid lung. IMPRESSION: Negative abdominal radiographs. Minimal patchy airspace opacity seen within the right mid lung, which is  non-specific could be due scarring and/or atelectasis. Electronically Signed   By: Jonna Clark M.D.   On: 04/12/2019 21:44     Medications:   . acetaminophen 1,000 mg (04/14/19 0537)  . amiodarone 30 mg/hr (04/14/19 0500)  . ceFEPime (MAXIPIME) IV 2 g (04/14/19 0555)  . heparin 550 Units/hr (04/14/19 0541)  . metronidazole 500 mg (04/14/19 0223)  . norepinephrine (LEVOPHED) Adult infusion 2 mcg/min (04/13/19 1618)  . phenylephrine (NEO-SYNEPHRINE) Adult infusion 80 mcg/min (04/13/19 0945)  .  sodium bicarbonate  infusion 1000 mL 100 mL/hr at 04/14/19 0500  . vasopressin (PITRESSIN) infusion - *FOR SHOCK* 0.04 Units/min (04/14/19 0500)   . atorvastatin  20 mg Oral Daily  . Chlorhexidine Gluconate Cloth  6 each Topical Daily  . famotidine  20 mg Oral Q24H  . insulin aspart  0-9 Units Subcutaneous Q4H  . mouth rinse  15 mL Mouth Rinse q12n4p  . vancomycin variable dose per unstable  renal function (pharmacist dosing)   Does not apply See admin instructions   acetaminophen  Assessment/ Plan:  78 y.o. caucasian  female with DM-2, HTN, HLD, Recurrent UTIs,  Afib comes in with diarrhea, nausea and vomiting  1. ARF, non oliguric 2. Generalized edema 3. A Fib 4. Hypotension, requiring pressors 5. Cellulitis of leg, sepsis- broad spectrum antibiotics as per icu team 6. UTI 7. Hypokalemia   Worsening renal parameters today UOP borderline low Replace potassium as needed Palliative care team scheduled to evaluate For now, will prefer not to escalate care with dialysis given underlying h/o dementia and poor performance status. Will continue to evaluate and follow clinical course Agree with pressors to maintain hemodynamic stability Avoid hypotension    LOS: 2 Smokey Melott 12/7/20208:14 AM  [facility] [place], [place] [phone]  Note: This note was prepared with Dragon dictation. Any transcription errors are unintentional

## 2019-04-14 NOTE — Consult Note (Signed)
[facility] for Heparin Infusion  Indication: atrial fibrillation  Allergies  Allergen Reactions  . Sulfa Antibiotics Nausea And Vomiting    Patient Measurements: Height: 5' (152.4 cm) Weight: 130 lb 1.1 oz (59 kg) IBW/kg (Calculated) : 45.5  Vital Signs: Temp: 98.2 F (36.8 C) (12/07 2000) Temp Source: Axillary (12/07 2000) BP: 139/96 (12/07 2004) Pulse Rate: 128 (12/07 2004)  Labs: Recent Labs    04/12/19 2137 04/13/19 0422  04/13/19 2353 04/14/19 0430 04/14/19 0946 04/14/19 1953  HGB 12.8 9.2*  --   --  9.4*  --   --   HCT 41.8 30.9*  --   --  30.7*  --   --   PLT 292 218  --   --  170  --   --   LABPROT 13.8  --   --   --   --   --   --   INR 1.1  --   --   --   --   --   --   HEPARINUNFRC  --   --    < > 0.93*  --  0.44 0.49  CREATININE 3.48* 2.90*  --   --  3.32*  --   --    < > = values in this interval not displayed.    Estimated Creatinine Clearance: 11 mL/min (A) (by C-G formula based on SCr of 3.32 mg/dL (H)).  Assessment: Pharmacy consulted for heparin drip dosing and monitoring in 78 yo female with A. Fib. Patient has not been on anticoagulation PTA due to hx of hematuria.  Platelets trending downward, currently wnl.  H/H lower than baseline, but appears to be stable.  Heparin Course: 12/6 initiation 3500 unit bolus then 800 units/hour 12/6 1244 HL 1.66, paused 12/6 restarted @ 700 units/hr 12/6 @ 2359 HL 0.93  rate lowered  to 550 units/hr. 12/7 0946 HL 0.44, no change.  Goal of Therapy:  Heparin level 0.3-0.7 units/ml Monitor platelets by anticoagulation protocol: Yes   Plan:  12/7 1953 HL 0.49 therapeutic x 2. Will continue heparin drip at current rate. HL and CBC daily while on heparin drip.  Tawnya Crook, PharmD 04/14/2019 8:33 PM

## 2019-04-14 NOTE — Consult Note (Signed)
ANTICOAGULATION CONSULT NOTE - Follow Up Consult  Pharmacy Consult for Heparin Infusion  Indication: atrial fibrillation  Allergies  Allergen Reactions  . Sulfa Antibiotics Nausea And Vomiting    Patient Measurements: Height: 5' (152.4 cm) Weight: 130 lb 1.1 oz (59 kg) IBW/kg (Calculated) : 45.5  Vital Signs: Temp: 99.3 F (37.4 C) (12/07 0000) Temp Source: Rectal (12/06 1930) BP: 100/79 (12/07 0000) Pulse Rate: 113 (12/07 0000)  Labs: Recent Labs    04/12/19 2137 04/13/19 0422 04/13/19 1244 04/13/19 2353  HGB 12.8 9.2*  --   --   HCT 41.8 30.9*  --   --   PLT 292 218  --   --   LABPROT 13.8  --   --   --   INR 1.1  --   --   --   HEPARINUNFRC  --   --  1.66* 0.93*  CREATININE 3.48* 2.90*  --   --     Estimated Creatinine Clearance: 12.6 mL/min (A) (by C-G formula based on SCr of 2.9 mg/dL (H)).  Assessment: Pharmacy consulted for heparin drip dosing and monitoring in 78 yo female with A. Fib. Patient has not been on anticoagulation PTA due to hx of hematuria.   Hgb: 12.8  Plts: 292  CrCl: 10.5  Scr: 3.48   12/6 1244 HL 1.66, spoke to RN to see if blood draw was possibly from the line the Heparin was infusing in but RN stated that patient only has peripheral access and that the Heparin had been turned off ~12:00 for a possible central line insertion. According to home med list patient was not taking anticoagulant prior to admission.  12/6 Heparin infusion restarted @ 700 units/hr  Goal of Therapy:  Heparin level 0.3-0.7 units/ml Monitor platelets by anticoagulation protocol: Yes   Plan:  12/6 @ 2353 HL: 0.93. Level is supratherapeutic.  Will adjust heparin infusion rate to 550 units/hr.  Recheck HL  8 hours after infusion rate change. Daily CBC while on Heparin drip.  Pernell Dupre, PharmD, BCPS Clinical Pharmacist 04/14/2019 12:52 AM

## 2019-04-14 NOTE — Consult Note (Signed)
ANTICOAGULATION CONSULT NOTE - Follow Up Consult  Pharmacy Consult for Heparin Infusion  Indication: atrial fibrillation  Allergies  Allergen Reactions  . Sulfa Antibiotics Nausea And Vomiting    Patient Measurements: Height: 5' (152.4 cm) Weight: 130 lb 1.1 oz (59 kg) IBW/kg (Calculated) : 45.5  Vital Signs: Temp: 99.5 F (37.5 C) (12/07 0800) BP: 180/118 (12/07 0800) Pulse Rate: 94 (12/07 0800)  Labs: Recent Labs    04/12/19 2137 04/13/19 0422 04/13/19 1244 04/13/19 2353 04/14/19 0430 04/14/19 0946  HGB 12.8 9.2*  --   --  9.4*  --   HCT 41.8 30.9*  --   --  30.7*  --   PLT 292 218  --   --  170  --   LABPROT 13.8  --   --   --   --   --   INR 1.1  --   --   --   --   --   HEPARINUNFRC  --   --  1.66* 0.93*  --  0.44  CREATININE 3.48* 2.90*  --   --  3.32*  --     Estimated Creatinine Clearance: 11 mL/min (A) (by C-G formula based on SCr of 3.32 mg/dL (H)).  Assessment: Pharmacy consulted for heparin drip dosing and monitoring in 78 yo female with A. Fib. Patient has not been on anticoagulation PTA due to hx of hematuria.  Platelets trending downward, currently wnl.  H/H lower than baseline, but appears to be stable.  Heparin Course: 12/6 initiation 3500 unit bolus then 800 units/hour 12/6 1244 HL 1.66, paused 12/6 restarted @ 700 units/hr 12/6 @ 2359 HL 0.93  rate lowered  to 550 units/hr. 12/7 0946 HL 0.44, no change.  Goal of Therapy:  Heparin level 0.3-0.7 units/ml Monitor platelets by anticoagulation protocol: Yes   Plan:  12/7 0946 HL 0.44, therapeutic.   Continue current rate (550 units/hour). Obtain new heparin level in 8 hours to confirm if treatment is therapeutic.  Daily CBC while on Heparin drip.  Gerald Dexter, PharmD Pharmacy Resident  04/14/2019 11:03 AM

## 2019-04-14 NOTE — Consult Note (Signed)
Pharmacy Antibiotic Note  Tiffany Manning is a 78 y.o. female admitted on 04/12/2019 with sepsis secondary to LE wound cellulitis and possible intra-abdominal infection.  Pharmacy has been consulted for cefepime and Vancomycin dosing. Patient is also receiving Flagyl.   Patient currently has severe acute kidney injury. Scr on admission 3.48. Baseline ~ 1.05. Scr improved to 2.9 today.   Vancomycin 1250mg  IV x 1 dose given in ED 12/6  Plan: 12/6 @ 2353 Vanc random: 15. Due to unstable renal function pharmacy is dosing by vanc levels.   Will order vancomycin 500mg  IV x 1 dose. Will plan to recheck vancomycin random level 12/8 AM.   Start cefepime 2g IV every 24 hours   Height: 5' (152.4 cm) Weight: 130 lb 1.1 oz (59 kg) IBW/kg (Calculated) : 45.5  Temp (24hrs), Avg:96.8 F (36 C), Min:92.3 F (33.5 C), Max:99.3 F (37.4 C)  Recent Labs  Lab 04/12/19 2137 04/12/19 2321 04/13/19 0112 04/13/19 0422 04/13/19 2353  WBC 16.1*  --   --  20.0*  --   CREATININE 3.48*  --   --  2.90*  --   LATICACIDVEN 4.2* 3.6* 3.4* 3.5*  --   VANCORANDOM  --   --   --   --  15    Estimated Creatinine Clearance: 12.6 mL/min (A) (by C-G formula based on SCr of 2.9 mg/dL (H)).    Allergies  Allergen Reactions  . Sulfa Antibiotics Nausea And Vomiting    Antimicrobials this admission: 12/5 Zosyn >> x1 12/5 Vancomycin>> 12/6 Cefepime>> 12/6 flagyl>>   Microbiology results: 12/5 BCx: pending 12/5 SARS Coronavirus 2: negative 12/6 Ucx: pending    Thank you for allowing pharmacy to be a part of this patient's care.  Pernell Dupre, PharmD, BCPS Clinical Pharmacist 04/14/2019 1:10 AM

## 2019-04-14 NOTE — Progress Notes (Signed)
Spoke with patient regarding PICC Placement.  Patient is not a candidate for dialysis.  Most recent blood pressure is 82/51.  PICC RN informed nurse that patient blood pressure needed to be more stable for PICC placement. Patient RN to contact MD for meds to help stabilize blood pressure.  Patient RN to call and get consent from patient husband.

## 2019-04-15 DIAGNOSIS — R52 Pain, unspecified: Secondary | ICD-10-CM

## 2019-04-15 DIAGNOSIS — A419 Sepsis, unspecified organism: Secondary | ICD-10-CM | POA: Diagnosis not present

## 2019-04-15 DIAGNOSIS — R6521 Severe sepsis with septic shock: Secondary | ICD-10-CM | POA: Diagnosis not present

## 2019-04-15 DIAGNOSIS — Z515 Encounter for palliative care: Secondary | ICD-10-CM

## 2019-04-15 DIAGNOSIS — N179 Acute kidney failure, unspecified: Secondary | ICD-10-CM | POA: Diagnosis not present

## 2019-04-15 DIAGNOSIS — Z7189 Other specified counseling: Secondary | ICD-10-CM

## 2019-04-15 DIAGNOSIS — R0602 Shortness of breath: Secondary | ICD-10-CM

## 2019-04-15 LAB — MAGNESIUM: Magnesium: 1.9 mg/dL (ref 1.7–2.4)

## 2019-04-15 LAB — GLUCOSE, CAPILLARY
Glucose-Capillary: 160 mg/dL — ABNORMAL HIGH (ref 70–99)
Glucose-Capillary: 131 mg/dL — ABNORMAL HIGH (ref 70–99)

## 2019-04-15 LAB — URINE CULTURE: Special Requests: NORMAL

## 2019-04-15 LAB — CBC
HCT: 25.4 % — ABNORMAL LOW (ref 36.0–46.0)
Hemoglobin: 7.9 g/dL — ABNORMAL LOW (ref 12.0–15.0)
MCH: 33.8 pg (ref 26.0–34.0)
MCHC: 31.1 g/dL (ref 30.0–36.0)
MCV: 108.5 fL — ABNORMAL HIGH (ref 80.0–100.0)
Platelets: 121 10*3/uL — ABNORMAL LOW (ref 150–400)
RBC: 2.34 MIL/uL — ABNORMAL LOW (ref 3.87–5.11)
RDW: 15.5 % (ref 11.5–15.5)
WBC: 10.1 10*3/uL (ref 4.0–10.5)
nRBC: 1.6 % — ABNORMAL HIGH (ref 0.0–0.2)

## 2019-04-15 LAB — VANCOMYCIN, RANDOM: Vancomycin Rm: 17

## 2019-04-15 LAB — RENAL FUNCTION PANEL
Albumin: 1.8 g/dL — ABNORMAL LOW (ref 3.5–5.0)
Anion gap: 19 — ABNORMAL HIGH (ref 5–15)
BUN: 40 mg/dL — ABNORMAL HIGH (ref 8–23)
CO2: 23 mmol/L (ref 22–32)
Calcium: 6 mg/dL — CL (ref 8.9–10.3)
Chloride: 101 mmol/L (ref 98–111)
Creatinine, Ser: 2.81 mg/dL — ABNORMAL HIGH (ref 0.44–1.00)
GFR calc Af Amer: 18 mL/min — ABNORMAL LOW (ref >60)
GFR calc non Af Amer: 15 mL/min — ABNORMAL LOW (ref >60)
Glucose, Bld: 176 mg/dL — ABNORMAL HIGH (ref 70–99)
Phosphorus: 2.9 mg/dL (ref 2.5–4.6)
Potassium: 2.9 mmol/L — ABNORMAL LOW (ref 3.5–5.1)
Sodium: 143 mmol/L (ref 135–145)

## 2019-04-15 LAB — HEPARIN LEVEL (UNFRACTIONATED): Heparin Unfractionated: 0.23 IU/mL — ABNORMAL LOW (ref 0.30–0.70)

## 2019-04-15 MED ORDER — ALBUMIN HUMAN 25 % IV SOLN
25.0000 g | Freq: Two times a day (BID) | INTRAVENOUS | Status: DC
  Administered 2019-04-15 – 2019-04-16 (×2): 25 g via INTRAVENOUS
  Filled 2019-04-15 (×3): qty 50

## 2019-04-15 MED ORDER — GLYCOPYRROLATE 0.2 MG/ML IJ SOLN: 0.4000 mg | INTRAMUSCULAR | Status: DC | PRN

## 2019-04-15 MED ORDER — CALCIUM GLUCONATE-NACL 1-0.675 GM/50ML-% IV SOLN
1.0000 g | Freq: Once | INTRAVENOUS | Status: AC
  Administered 2019-04-15: 1000 mg via INTRAVENOUS
  Filled 2019-04-15: qty 50

## 2019-04-15 MED ORDER — FENTANYL CITRATE (PF) 100 MCG/2ML IJ SOLN
12.5000 ug | INTRAMUSCULAR | Status: DC | PRN
  Administered 2019-04-16 (×3): 12.5 ug via INTRAVENOUS
  Filled 2019-04-15 (×3): qty 2

## 2019-04-15 MED ORDER — POTASSIUM CHLORIDE 10 MEQ/100ML IV SOLN
10.0000 meq | INTRAVENOUS | Status: AC
  Administered 2019-04-15 (×4): 10 meq via INTRAVENOUS
  Filled 2019-04-15 (×4): qty 100

## 2019-04-15 NOTE — Progress Notes (Signed)
Informed NP that IV pulled out in right AC and arm resembles extravasation. IV removed from right arm, wrapped with kerlix, elevated on pillow and ice pack applied. IV team consult put in for patient by NP.

## 2019-04-15 NOTE — Progress Notes (Signed)
Palliative:  Full consult note to follow. Adult children coming from out of state to visit. Family wishes to continue current interventions until they are able to arrive. No escalation of care. Once all family arrive (next 1-2 days) plans for transition to full comfort care and hospice facility per their wishes.   Vinie Sill, NP Palliative Medicine Team Pager [phone] (Please see [email or web address] for schedule) Team Phone [phone]

## 2019-04-15 NOTE — Consult Note (Signed)
Consultation Note Date: 04/15/2019   Patient Name: Tiffany Manning  DOB: 04-08-1941  MRN: [id number]  Age / Sex: 78 y.o., female  PCP: Tiffany Billet, MD Referring Physician: Tyler Pita, MD  Reason for Consultation: Establishing goals of care, Hospice Evaluation and Non pain symptom management  HPI/Patient Profile: 78 y.o. 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 fib (no anticoagulation s/t hematuria, recent hospitalization 11/16-11/20 with similar symptoms and A. fib [facility] admitted on 04/12/2019 with N/V, diarrhea, decreased PO intake with sepsis to LLE cellulitis, possible intra-abd source, r/o bacteremia. Hospitalization complicated by acute renal failure and need for vasopressor support.    Clinical Assessment and Goals of Care: I met today at Tiffany Manning bedside with her husband. He tells me that she has been steadily declining with QOL and functional status at home. He has been struggling to care for her and she was not even able to walk or participate in her care at all since last admission. Appetite has been poor even at home.   We discussed GOC given her declining status at home, acute renal failure, infection requiring pressors and advancing dementia. We discussed their wishes for aggressive care and husband is clear that they would not desire any life supportive measures or to be hooked up to any machines. We discussed aggressive care and if these are measures we are doing for her or to her understanding that her overall prognosis is poor. He understands. He calls his daughter Tiffany Manning and I discussed the same above with her. Family are interested in pursuing comfort care and do not wish for prolonging measures. However, she has sons that wish to come see her from [place] and [place] that can make their way here to spend time with her. She  has been asking to see all her children this morning. At this time we have decided to continue current interventions but not to escalate care further. Comfort is priority. We plan to transition to full comfort care once children arrive with hopes of transition to hospice facility.   All questions/concerns addressed.   Primary Decision Maker NEXT OF KIN husband (with support of his daughter)    SUMMARY OF RECOMMENDATIONS   - Continue current therapies - Plan for transition to comfort and hospice once all family arrive  Code Status/Advance Care Planning:  DNR   Symptom Management:   Pain/SOB: Fentanyl 12.5 mcg every 2 hours prn.   Robinul PRN secretions/gurgling.   Palliative Prophylaxis:   Aspiration, Bowel Regimen, Delirium Protocol, Frequent Pain Assessment, Oral Care and Turn Reposition  Additional Recommendations (Limitations, Scope, Preferences):  No Artificial Feeding, No Hemodialysis and No Surgical Procedures  Psycho-social/Spiritual:   Desire for further Chaplaincy support:yes  Additional Recommendations: Education on Hospice and Grief/Bereavement Support  Prognosis:   < 2 weeks and more likely days  Discharge Planning: To Be Determined      Primary Diagnoses: Present on Admission:  Sepsis ([place])  AKI (acute kidney injury) ([place])  DM (diabetes mellitus), 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 The following aspects are pertinent.  Past Medical History:  Diagnosis Date   A-fib ([place])    Arthritis    Diabetes mellitus without complication ([place])    History of kidney stones    Hyperlipidemia    Hypertension    PONV (postoperative nausea and vomiting)    Sepsis ([place])    Social History   Socioeconomic History   Marital status: Married    Spouse name: Not on  file   Number of children: Not on file   Years of education: Not on file   Highest education level: Not on file  Occupational History   Not on file  Social Needs   Financial resource strain: Not hard at all   Food insecurity    Worry: Never true    Inability: Never true   Transportation needs    Medical: No    Non-medical: No  Tobacco Use   Smoking status: Never Smoker   Smokeless tobacco: Never Used  Substance and Sexual Activity   Alcohol use: No    Frequency: Never   Drug use: No   Sexual activity: Not Currently  Lifestyle   Physical activity    Days per week: 0 days    Minutes per session: 0 min   Stress: Not at all  Relationships   Social connections    Talks on phone: Twice a week    Gets together: Twice a week    Attends religious service: More than 4 times per year    Active member of club or organization: Yes    Attends meetings of clubs or organizations: 1 to 4 times per year    Relationship status: Married  Other Topics Concern   Not on file  Social History Narrative   Lives with husband at home   Family History  Problem Relation Age of Onset   Diabetes Sister    Scheduled Meds:  atorvastatin  20 mg Oral Daily   Chlorhexidine Gluconate Cloth  6 each Topical Daily   famotidine  20 mg Oral Q24H   insulin aspart  0-9 Units Subcutaneous Q4H   mouth rinse  15 mL Mouth Rinse q12n4p   vancomycin variable dose per unstable renal function (pharmacist dosing)   Does not apply See admin instructions   Continuous Infusions:  albumin human     amiodarone 60 mg/hr (04/15/19 0646)   ceFEPime (MAXIPIME) IV 2 g (04/15/19 1245)   heparin 550 Units/hr (04/15/19 0644)   metronidazole 500 mg (04/15/19 0210)   norepinephrine (LEVOPHED) Adult infusion 2 mcg/min (04/15/19 0647)   potassium chloride 10 mEq (04/15/19 0858)   PRN Meds:.acetaminophen Allergies  Allergen Reactions   Sulfa Antibiotics Nausea And Vomiting   Review of  Systems  Unable to perform ROS: Acuity of condition    Physical Exam Vitals signs and nursing note reviewed.  Constitutional:      General: She is not in acute distress.    Appearance: She is ill-appearing.     Comments: Thin, frail   Cardiovascular:     Rate and Rhythm: Normal rate.  Pulmonary:     Effort: Pulmonary effort is normal. No tachypnea, accessory muscle usage or respiratory distress.  Abdominal:     Palpations: Abdomen is soft.  Neurological:  Mental Status: She is lethargic and confused.     Vital Signs: BP (!) 74/47    Pulse 89    Temp 98.2 F (36.8 C) (Axillary)    Resp 16    Ht 5' (1.524 m)    Wt 59 kg    SpO2 100%    BMI 25.40 kg/m  Pain Scale: PAINAD POSS *See Group Information*: S-Acceptable,Sleep, easy to arouse     SpO2: SpO2: 100 % O2 Device:SpO2: 100 % O2 Flow Rate: .O2 Flow Rate (L/min): 3 L/min  IO: Intake/output summary:   Intake/Output Summary (Last 24 hours) at 04/15/2019 1012 Last data filed at 04/15/2019 0500 Gross per 24 hour  Intake 3289.58 ml  Output 450 ml  Net 2839.58 ml    LBM: Last BM Date: 04/14/19 Baseline Weight: Weight: 59 kg Most recent weight: Weight: 59 kg     Palliative Assessment/Data:     Time In: 1045 Time Out: 1155 Time Total: 70 min Greater than 50%  of this time was spent counseling and coordinating care related to the above assessment and plan.  Signed by: Vinie Sill, NP Palliative Medicine Team Pager # [phone] (M-F 8a-5p) Team Phone # [phone] (Nights/Weekends)

## 2019-04-15 NOTE — Procedures (Signed)
Central Venous Catheter Insertion Procedure Note HEDWIG MCFALL [id number] 05/27/1940  Procedure: Insertion of Central Venous Catheter Indications: Assessment of intravascular volume, Drug and/or fluid administration and Frequent blood sampling  Procedure Details Consent: Risks of procedure as well as the alternatives and risks of each were explained to the (patient/caregiver).  Consent for procedure obtained. Time Out: Verified patient identification, verified procedure, site/side was marked, verified correct patient position, special equipment/implants available, medications/allergies/relevent history reviewed, required imaging and test results available.  Performed  Maximum sterile technique was used including antiseptics, cap, gloves, gown, hand hygiene, mask and sheet. Skin prep: Chlorhexidine; local anesthetic administered A antimicrobial bonded/coated triple lumen catheter was placed in the right femoral vein due to multiple attempts, no other available access using the Seldinger technique.  Evaluation Blood flow good Complications: No apparent complications Patient did tolerate procedure well. Chest X-ray ordered to verify placement.  CXR: not indicated .  Pt no longer has peripheral iv access and IV team unable to place PICC line due to vasculature.  Therefore, right femoral CVL placed utilizing ultrasound no complications noted during or following procedure.  Marda Stalker, [place] Pager [phone] (please enter 7 digits) PCCM Consult Pager [phone] (please enter 7 digits)

## 2019-04-15 NOTE — Progress Notes (Signed)
[facility] , [place] 04/15/19  Subjective:  Admitted with sepsis secondary to wound cellulitis and possible intraabdominal infection, diarrhea, weakness, decreased oral intake  Neuro- lethargic but arousable,  Nursing staff report h/o dementia cvs- a fib, requiring pressors, amiodarone drip, generalized edema pulm- [place] O2 Gi- appears to have poor oral intake  Renal- 12/07 0701 - 12/08 0700 In: 3462.3 [I.V.:3188.1; IV Piggyback:274.2] Out: 560 [Urine:560]] Lab Results  Component Value Date   CREATININE 2.81 (H) 04/15/2019   CREATININE 3.32 (H) 04/14/2019   CREATININE 2.90 (H) 04/13/2019   Iv bicarb infusion    Hospital day: 3   Objective:  Vital signs in last 24 hours:  Temp:  [98.1 F (36.7 C)-98.9 F (37.2 C)] 98.2 F (36.8 C) (12/08 0400) Pulse Rate:  [70-155] 89 (12/08 0530) Resp:  [13-35] 16 (12/08 0530) BP: (64-163)/(44-96) 74/47 (12/08 0500) SpO2:  [84 %-100 %] 100 % (12/08 0530)  Weight change:  Filed Weights   04/12/19 2130  Weight: 59 kg    Intake/Output:    Intake/Output Summary (Last 24 hours) at 04/15/2019 0857 Last data filed at 04/15/2019 0500 Gross per 24 hour  Intake 3462.34 ml  Output 450 ml  Net 3012.34 ml     Physical Exam: General: Chronically ill appearing  HEENT Anicteric, dry oral mucus membraned  Pulm/lungs [place] O2, coarse breath sounds  CVS/Heart Irregular, A fib  Abdomen:  soft  Extremities:  ++  Generalized edema  Neurologic: Arousable, moaning, did not follow commands  Skin: Nursing report bruising in perineal area   Foley in place    Basic Metabolic Panel:  Recent Labs  Lab 04/12/19 2137 04/13/19 0422 04/14/19 0430 04/15/19 0338  NA 140 142 144 143  K 4.6 3.6 3.2* 2.9*  CL 107 111 104 101  CO2 <7* <7* 14* 23  GLUCOSE 216* 369* 247* 176*  BUN 52* 44* 44* 40*  CREATININE 3.48* 2.90* 3.32* 2.81*  CALCIUM 8.3* 6.2* 6.4* 6.0*  MG  --   --  1.7 1.9  PHOS  --   --  5.1* 2.9      CBC: Recent Labs  Lab 04/12/19 2137 04/13/19 0422 04/14/19 0430 04/15/19 0338  WBC 16.1* 20.0* 18.6* 10.1  NEUTROABS 13.1*  --   --   --   HGB 12.8 9.2* 9.4* 7.9*  HCT 41.8 30.9* 30.7* 25.4*  MCV 113.3* 114.9* 110.8* 108.5*  PLT 292 218 170 121*     No results found for: HEPBSAG, HEPBSAB, HEPBIGM    Microbiology:  Recent Results (from the past 240 hour(s))  SARS CORONAVIRUS 2 (TAT 6-24 HRS) Nasopharyngeal Nasopharyngeal Swab     Status: None   Collection Time: 04/12/19  9:30 PM   Specimen: Nasopharyngeal Swab  Result Value Ref Range Status   SARS Coronavirus 2 NEGATIVE NEGATIVE Final    Comment: (NOTE) SARS-CoV-2 target nucleic acids are NOT DETECTED. The SARS-CoV-2 RNA is generally detectable in upper and lower respiratory specimens during the acute phase of infection. Negative results do not preclude SARS-CoV-2 infection, do not rule out co-infections with other pathogens, and should not be used as the sole basis for treatment or other patient management decisions. Negative results must be combined with clinical observations, patient history, and epidemiological information. The expected result is Negative. Fact Sheet for Patients: HairSlick.nohttps://www.fda.gov/media/138098/download Fact Sheet for Healthcare Providers: quierodirigir.comhttps://www.fda.gov/media/138095/download This test is not yet approved or cleared by the [place] FDA and  has been authorized for detection and/or diagnosis of SARS-CoV-2 by FDA  under an Emergency Use Authorization (EUA). This EUA will remain  in effect (meaning this test can be used) for the duration of the COVID-19 declaration under Section 56 4(b)(1) of the Act, 21 U.S.C. section 360bbb-3(b)(1), unless the authorization is terminated or revoked sooner. Performed at [facility], [address] [place], [place] [address]   Blood Culture (routine x 2)     Status: None (Preliminary result)   Collection Time: 04/12/19  9:38 PM    Specimen: BLOOD  Result Value Ref Range Status   Specimen Description BLOOD Blood Culture adequate volume  Final   Special Requests   Final    BOTTLES DRAWN AEROBIC AND ANAEROBIC LEFT ANTECUBITAL   Culture   Final    NO GROWTH 3 DAYS Performed at [facility], [address] [place], [place] [address]    Report Status PENDING  Incomplete  Blood Culture (routine x 2)     Status: None (Preliminary result)   Collection Time: 04/12/19  9:38 PM   Specimen: BLOOD  Result Value Ref Range Status   Specimen Description BLOOD Blood Culture adequate volume  Final   Special Requests   Final    BOTTLES DRAWN AEROBIC AND ANAEROBIC RIGHT ANTECUBITAL   Culture   Final    NO GROWTH 3 DAYS Performed at [facility], [address] [place], [place] [address]    Report Status PENDING  Incomplete  MRSA PCR Screening     Status: None   Collection Time: 04/13/19  6:11 AM   Specimen: Nasopharyngeal  Result Value Ref Range Status   MRSA by PCR NEGATIVE NEGATIVE Final    Comment:        The GeneXpert MRSA Assay (FDA approved for NASAL specimens only), is one component of a comprehensive MRSA colonization surveillance program. It is not intended to diagnose MRSA infection nor to guide or monitor treatment for MRSA infections. Performed at [facility], [address], [place], [place] [address]   Urine Culture     Status: Abnormal   Collection Time: 04/13/19  4:57 PM   Specimen: Urine, Catheterized  Result Value Ref Range Status   Specimen Description   Final    URINE, CATHETERIZED Performed at [facility], [address] [place], [place] [address]    Special Requests   Final    Normal Performed at [facility], [address] [place], [place] [address]    Culture MULTIPLE SPECIES PRESENT, SUGGEST RECOLLECTION (A)  Final   Report Status 04/15/2019 FINAL  Final    Coagulation Studies: Recent Labs    04/12/19 2135-09-05  LABPROT 13.8   INR 1.1    Urinalysis: Recent Labs    04/13/19 0000  COLORURINE YELLOW*  LABSPEC 1.024  PHURINE 5.0  GLUCOSEU NEGATIVE  HGBUR LARGE*  BILIRUBINUR NEGATIVE  KETONESUR NEGATIVE  PROTEINUR 100*  NITRITE NEGATIVE  LEUKOCYTESUR LARGE*      Imaging: Dg Chest Port 1 View  Result Date: 04/13/2019 CLINICAL DATA:  78 year old female with lung disease. EXAM: PORTABLE CHEST 1 VIEW COMPARISON:  Chest radiograph dated 02/05/2018. FINDINGS: Shallow inspiration with minimal bibasilar atelectasis. No focal consolidation, pleural effusion, or pneumothorax. A 2 cm nodular density in the left upper lobe was seen on the prior radiographs and corresponds to the nodule seen on the neck CT dated 05/28/2017. Top-normal cardiac silhouette. Atherosclerotic calcification of the aortic arch. No acute osseous pathology. IMPRESSION: 1. No acute cardiopulmonary process. 2. A 2 cm nodular density  in the left upper lobe present dating back to 05/28/2017. Electronically Signed   By: Anner Crete M.D.   On: 04/13/2019 18:52   [place] Ekg Site Rite  Result Date: 04/14/2019 If Site Rite image not attached, placement could not be confirmed due to current cardiac rhythm.    Medications:   . amiodarone 60 mg/hr (04/15/19 0646)  . ceFEPime (MAXIPIME) IV 2 g (04/15/19 6720)  . heparin 550 Units/hr (04/15/19 0644)  . metronidazole 500 mg (04/15/19 0210)  . norepinephrine (LEVOPHED) Adult infusion 2 mcg/min (04/15/19 0647)  . potassium chloride    .  sodium bicarbonate  infusion 1000 mL 100 mL/hr at 04/15/19 0642   . atorvastatin  20 mg Oral Daily  . Chlorhexidine Gluconate Cloth  6 each Topical Daily  . famotidine  20 mg Oral Q24H  . insulin aspart  0-9 Units Subcutaneous Q4H  . mouth rinse  15 mL Mouth Rinse q12n4p  . vancomycin variable dose per unstable renal function (pharmacist dosing)   Does not apply See admin instructions   acetaminophen  Assessment/ Plan:  78 y.o. caucasian  female with DM-2, HTN,  HLD, Recurrent UTIs,  Afib comes in with diarrhea, nausea and vomiting  1. ARF, non oliguric, likely ATN 2. Generalized edema 3. A Fib 4. Hypotension, requiring pressors 5. Cellulitis of leg, sepsis- broad spectrum antibiotics as per icu team 6. UTI 7. Hypokalemia  UOP 560 cc, S Creatinine improved today   Replace potassium as needed Agree with iv albumin supplementation Palliative care team scheduled to evaluate For now, will prefer not to escalate care with dialysis given underlying h/o dementia and poor performance status. Will continue to evaluate and follow clinical course Agree with pressors to maintain hemodynamic stability Avoid hypotension    LOS: [place] 12/8/20208:57 AM  [place], [place]  Note: This note was prepared with Dragon dictation. Any transcription errors are unintentional

## 2019-04-15 NOTE — Progress Notes (Signed)
Follow up - Critical Care Medicine Note  Patient Details:    Tiffany Manning is an 78 y.o. female with dementia, adult failure to thrive, severe protein calorie malnutrition and recurrent bouts of hematuria.  Admitted with severe sepsis and septic shock.  Patient encephalopathic.  Lines, Airways, Drains: Urethral Catheter Charna ElizabethKatherine Clayton, RN Double-lumen;Latex 14 Fr. (Active)  Indication for Insertion or Continuance of Catheter Acute urinary retention (I&O Cath for 24 hrs prior to catheter insertion- Inpatient Only) 04/14/19 0800  Site Assessment Intact 04/14/19 0800  Catheter Maintenance Bag emptied prior to transport;Seal intact;No dependent loops;Insertion date on drainage bag;Drainage bag/tubing not touching floor;Catheter secured;Bag below level of bladder 04/14/19 0800  Collection Container Standard drainage bag 04/14/19 0800  Securement Method Securing device (Describe) 04/14/19 0800  Urinary Catheter Interventions (if applicable) Unclamped 04/14/19 0400  Output (mL) 150 mL 04/14/19 1228    Anti-infectives:  Anti-infectives (From admission, onward)   Start     Dose/Rate Route Frequency Ordered Stop   04/14/19 0200  vancomycin (VANCOCIN) 500 mg in sodium chloride 0.9 % 100 mL IVPB     500 mg 100 mL/hr over 60 Minutes Intravenous  Once 04/14/19 0114 04/14/19 0433   04/13/19 0600  ceFEPIme (MAXIPIME) 2 g in sodium chloride 0.9 % 100 mL IVPB     2 g 200 mL/hr over 30 Minutes Intravenous Every 24 hours 04/13/19 0203 04/19/19 2359   04/13/19 0230  metroNIDAZOLE (FLAGYL) IVPB 500 mg  Status:  Discontinued     500 mg 100 mL/hr over 60 Minutes Intravenous Every 8 hours 04/13/19 0201 04/15/19 1103   04/13/19 0203  vancomycin variable dose per unstable renal function (pharmacist dosing)  Status:  Discontinued      Does not apply See admin instructions 04/13/19 0203 04/15/19 1101   04/12/19 2315  vancomycin (VANCOCIN) 1,250 mg in sodium chloride 0.9 % 250 mL IVPB     1,250 mg 166.7  mL/hr over 90 Minutes Intravenous  Once 04/12/19 2306 04/13/19 0047   04/12/19 2145  piperacillin-tazobactam (ZOSYN) IVPB 3.375 g     3.375 g 100 mL/hr over 30 Minutes Intravenous  Once 04/12/19 2136 04/12/19 2241      Microbiology: Results for orders placed or performed during the hospital encounter of 04/12/19  SARS CORONAVIRUS 2 (TAT 6-24 HRS) Nasopharyngeal Nasopharyngeal Swab     Status: None   Collection Time: 04/12/19  9:30 PM   Specimen: Nasopharyngeal Swab  Result Value Ref Range Status   SARS Coronavirus 2 NEGATIVE NEGATIVE Final    Comment: (NOTE) SARS-CoV-2 target nucleic acids are NOT DETECTED. The SARS-CoV-2 RNA is generally detectable in upper and lower respiratory specimens during the acute phase of infection. Negative results do not preclude SARS-CoV-2 infection, do not rule out co-infections with other pathogens, and should not be used as the sole basis for treatment or other patient management decisions. Negative results must be combined with clinical observations, patient history, and epidemiological information. The expected result is Negative. Fact Sheet for Patients: HairSlick.nohttps://www.fda.gov/media/138098/download Fact Sheet for Healthcare Providers: quierodirigir.comhttps://www.fda.gov/media/138095/download This test is not yet approved or cleared by the [place] FDA and  has been authorized for detection and/or diagnosis of SARS-CoV-2 by FDA under an Emergency Use Authorization (EUA). This EUA will remain  in effect (meaning this test can be used) for the duration of the COVID-19 declaration under Section 56 4(b)(1) of the Act, 21 U.S.C. section 360bbb-3(b)(1), unless the authorization is terminated or revoked sooner. Performed at [facility], [address], [place], [place] [address]   Blood Culture (routine x 2)     Status: None (Preliminary result)   Collection Time: 04/12/19  9:38 PM   Specimen: BLOOD  Result Value Ref Range Status   Specimen Description  BLOOD Blood Culture adequate volume  Final   Special Requests   Final    BOTTLES DRAWN AEROBIC AND ANAEROBIC LEFT ANTECUBITAL   Culture   Final    NO GROWTH 3 DAYS Performed at [facility], [address], [place], [place] [address]    Report Status PENDING  Incomplete  Blood Culture (routine x 2)     Status: None (Preliminary result)   Collection Time: 04/12/19  9:38 PM   Specimen: BLOOD  Result Value Ref Range Status   Specimen Description BLOOD Blood Culture adequate volume  Final   Special Requests   Final    BOTTLES DRAWN AEROBIC AND ANAEROBIC RIGHT ANTECUBITAL   Culture   Final    NO GROWTH 3 DAYS Performed at [facility], [address] [place], [place] [address]    Report Status PENDING  Incomplete  MRSA PCR Screening     Status: None   Collection Time: 04/13/19  6:11 AM   Specimen: Nasopharyngeal  Result Value Ref Range Status   MRSA by PCR NEGATIVE NEGATIVE Final    Comment:        The GeneXpert MRSA Assay (FDA approved for NASAL specimens only), is one component of a comprehensive MRSA colonization surveillance program. It is not intended to diagnose MRSA infection nor to guide or monitor treatment for MRSA infections. Performed at [facility], [address] [place], [place] [address]   Urine Culture     Status: Abnormal   Collection Time: 04/13/19  4:57 PM   Specimen: Urine, Catheterized  Result Value Ref Range Status   Specimen Description   Final    URINE, CATHETERIZED Performed at [facility], [address], [place], [place] [address]    Special Requests   Final    Normal Performed at [facility], [address] [place], [place] [address]    Culture MULTIPLE SPECIES PRESENT, SUGGEST RECOLLECTION (A)  Final   Report Status 04/15/2019 FINAL  Final    Best Practice/Protocols:  VTE Prophylaxis: Heparin (SQ) GI Prophylaxis: Antihistamine   Events:   Studies: Dg Chest Port 1  View  Result Date: 04/13/2019 CLINICAL DATA:  78 year old female with lung disease. EXAM: PORTABLE CHEST 1 VIEW COMPARISON:  Chest radiograph dated 02/05/2018. FINDINGS: Shallow inspiration with minimal bibasilar atelectasis. No focal consolidation, pleural effusion, or pneumothorax. A 2 cm nodular density in the left upper lobe was seen on the prior radiographs and corresponds to the nodule seen on the neck CT dated 05/28/2017. Top-normal cardiac silhouette. Atherosclerotic calcification of the aortic arch. No acute osseous pathology. IMPRESSION: 1. No acute cardiopulmonary process. 2. A 2 cm nodular density in the left upper lobe present dating back to 05/28/2017. Electronically Signed   By: Elgie Collard M.D.   On: 04/13/2019 18:52   Dg Abdomen Acute W/chest  Result Date: 04/12/2019 CLINICAL DATA:  Nausea vomiting EXAM: DG ABDOMEN ACUTE W/ 1V CHEST COMPARISON:  None. FINDINGS: There is no evidence of dilated bowel loops or free intraperitoneal air. No radiopaque calculi or other significant radiographic abnormality is seen. There is mild cardiomegaly. No large airspace consolidation. Minimal nonspecific patchy airspace opacity seen within the right mid lung. IMPRESSION: Negative abdominal radiographs. Minimal patchy airspace opacity  seen within the right mid lung, which is non-specific could be due scarring and/or atelectasis. Electronically Signed   By: Prudencio Pair M.D.   On: 04/12/2019 21:44   Ct Renal Stone Study  Result Date: 03/24/2019 CLINICAL DATA:  Flank pain EXAM: CT ABDOMEN AND PELVIS WITHOUT CONTRAST TECHNIQUE: Multidetector CT imaging of the abdomen and pelvis was performed following the standard protocol without IV contrast. COMPARISON:  Ultrasound 12/24/2018, CT 01/28/2018, 09/19/2011 FINDINGS: Lower chest: Stable nodularity in the right middle lobe. 5 mm right middle lobe pulmonary nodule, decreased compared to prior measurement of 7 mm. No acute consolidation or effusion. Coronary  vascular calcification. Heart size within normal limits. Small hiatal hernia. Hepatobiliary: No focal hepatic abnormality. Status post cholecystectomy. No significant biliary dilatation Pancreas: No acute inflammatory changes. Pancreas is atrophic and contains a few parenchymal calcifications. Fifteen mm hypodense nodule adjacent to or exophytic to the pancreas body, stable to slight increase in size. Spleen: Normal in size without focal abnormality. Adrenals/Urinary Tract: Adrenal glands are normal. Lobulated atrophic left kidney. Moderate left and mild right hydronephrosis, decreased compared to prior study. Left hydroureter with stranding about the distal left ureter. No stones. Mild increased density within the posterior bladder. Small cyst lower pole left kidney. Stomach/Bowel: The stomach is nonenlarged. No dilated small bowel. Collapsed appearance of transverse colon. Possible wall thickening of the ascending colon. Vascular/Lymphatic: Moderate aortic atherosclerosis without aneurysm. No significantly enlarged lymph nodes. Reproductive: Status post hysterectomy. No adnexal masses. Other: Negative for free air or free fluid. Moderate fat containing infraumbilical hernia. Musculoskeletal: No acute or suspicious osseous abnormality. Similar scattered foci of sclerosis within the hips and pelvis. IMPRESSION: 1. Moderate left and mild right hydronephrosis with left hydroureter, though decreased compared to the prior CT. No ureteral stone is seen. Left ureter is dilated down to the level of the bladder. Cortical scarring and atrophy of the left kidney. 2. Mild increased density at the posterior bladder, possible small stones. 3. 15 mm hypodense nodule possibly arising from the pancreatic body. Nonemergent MRI recommended for further evaluation given increasing size as compared with prior exams. 4. 5 mm right middle lobe pulmonary nodule, decreased in size 5. Possible wall thickening of the ascending colon though  without significant surrounding inflammatory change. Correlation with colonoscopy suggested to exclude mass in the region. 6. Moderate fat containing infraumbilical ventral hernia Electronically Signed   By: Donavan Foil M.D.   On: 03/24/2019 21:31   [place] Ekg Site Rite  Result Date: 04/14/2019 If Site Rite image not attached, placement could not be confirmed due to current cardiac rhythm.   Consults: Treatment Team:  Lavonia Dana, MD Ottie Glazier, MD [facility], Palliative   Subjective:    Overnight Issues: Issues with poor IV access.  Central line placed this morning in the right femoral vein.  Terrible anasarca. Objective:  Vital signs for last 24 hours: Temp:  [98.1 F (36.7 C)-98.6 F (37 C)] 98.6 F (37 C) (12/08 1955) Pulse Rate:  [81-110] 94 (12/08 1900) Resp:  [13-22] 19 (12/08 1900) BP: (72-124)/(47-81) 106/62 (12/08 1900) SpO2:  [90 %-100 %] 100 % (12/08 1900)  Hemodynamic parameters for last 24 hours:    Intake/Output from previous day: 12/07 0701 - 12/08 0700 In: 3462.3 [I.V.:3188.1; IV Piggyback:274.2] Out: 560 [Urine:560]  Intake/Output this shift: Total I/O In: 192.5 [I.V.:124.7; IV Piggyback:67.8] Out: 50 [Urine:50]  Vent settings for last 24 hours:    Physical Exam:  GENERAL: Chronically ill-appearing, frail, poorly responsive HEAD: Normocephalic, atraumatic.  EYES:  Pupils equal, round, reactive to light.  No scleral icterus.  MOUTH: Poor dentition, oral mucosa moist NECK: Supple.  Trachea midline no JVD.  PULMONARY: Bibasilar crackles, coarse breath sounds CARDIOVASCULAR: Irregular rate and rhythm, controlled ventricular response at present GASTROINTESTINAL: Soft, nontender, non-distended.  Bowel sounds present MUSCULOSKELETAL: 3+ anasarca, muscle wasting NEUROLOGIC: Lethargic, mumbles nonsensically.Moans at other times. SKIN:i pressure wounds on lower extremities, fragile skin with weeping due to anasarca  Assessment/Plan:   Encephalopathy Dementia Toxic metabolic encephalopathy superimposed on dementia Encephalopathy likely due to severe sepsis/septic shock Sepsis likely from urinary tract source Continue antibiotics  Dysphagia Recurrent aspiration Husband notes that she has been having "choking" episodes over the last year and a half Nursing witnessed aspiration of her own secretions NPO. for now, swallow eval  Adult Failure to thrive Severe protein calorie malnutrition Poor prognosis due to ongoing adult failure to thrive Assess swallowing eval for supplementation and nutrition  Afib with RVR Continue amiodarone for now Correct electrolytes  Acute on chronic kidney injury Not a dialysis candidate Discussed with Dr. Thedore Mins   LOS: 3 days   Additional comments: Discussed during multidisciplinary rounds.  Discussed with patient's husband.  Patient's prognosis is exceedingly poor.  She is  DNR/DNI.  Palliative Care consultants have discussed with the patient's husband and plan is to transition her to comfort care once family members who are out of town can come to visit.   Critical Care Total Time*:  C. Danice Goltz, MD [place] [facility] 04/15/2019  *Care during the described time interval was provided by me and/or other providers on the critical care team.  I have reviewed this patient's available data, including medical history, events of note, physical examination and test results as part of my evaluation.  **This note was dictated using voice recognition software/Dragon.  Despite best efforts to proofread, errors can occur which can change the meaning.  Any change was purely unintentional.

## 2019-04-16 DIAGNOSIS — E43 Unspecified severe protein-calorie malnutrition: Secondary | ICD-10-CM | POA: Diagnosis not present

## 2019-04-16 DIAGNOSIS — A419 Sepsis, unspecified organism: Secondary | ICD-10-CM | POA: Diagnosis not present

## 2019-04-16 DIAGNOSIS — Z7189 Other specified counseling: Secondary | ICD-10-CM

## 2019-04-16 DIAGNOSIS — R6521 Severe sepsis with septic shock: Secondary | ICD-10-CM | POA: Diagnosis not present

## 2019-04-16 DIAGNOSIS — G9340 Encephalopathy, unspecified: Secondary | ICD-10-CM | POA: Diagnosis not present

## 2019-04-16 LAB — GLUCOSE, CAPILLARY: Glucose-Capillary: 183 mg/dL — ABNORMAL HIGH (ref 70–99)

## 2019-04-16 MED ORDER — HALOPERIDOL LACTATE 5 MG/ML IJ SOLN: 0.5000 mg | Freq: Four times a day (QID) | INTRAMUSCULAR | Status: DC | PRN

## 2019-04-16 MED ORDER — FENTANYL CITRATE (PF) 100 MCG/2ML IJ SOLN
12.5000 ug | Freq: Once | INTRAMUSCULAR | Status: AC
  Administered 2019-04-16: 03:00:00 12.5 ug via INTRAVENOUS
  Filled 2019-04-16: qty 2

## 2019-04-16 MED ORDER — FENTANYL CITRATE (PF) 100 MCG/2ML IJ SOLN
12.5000 ug | INTRAMUSCULAR | Status: DC | PRN
  Administered 2019-04-16: 25 ug via INTRAVENOUS
  Filled 2019-04-16: qty 2

## 2019-04-16 MED ORDER — LORAZEPAM 2 MG/ML IJ SOLN
0.5000 mg | INTRAMUSCULAR | Status: DC | PRN
  Administered 2019-04-16 – 2019-04-22 (×10): 0.5 mg via INTRAVENOUS
  Filled 2019-04-16 (×12): qty 1

## 2019-04-16 MED ORDER — DEXTROSE 50 % IV SOLN
INTRAVENOUS | Status: AC
  Filled 2019-04-16: qty 50

## 2019-04-16 MED ORDER — MORPHINE SULFATE (PF) 2 MG/ML IV SOLN
2.0000 mg | INTRAVENOUS | Status: DC | PRN
  Administered 2019-04-16: 21:00:00 2 mg via INTRAVENOUS
  Administered 2019-04-16: 3 mg via INTRAVENOUS
  Administered 2019-04-16: 23:00:00 2 mg via INTRAVENOUS
  Filled 2019-04-16: qty 1
  Filled 2019-04-16: qty 2
  Filled 2019-04-16 (×2): qty 1

## 2019-04-16 NOTE — Progress Notes (Signed)
Palliative:  HPI: 78 y.o. 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 fib (no anticoagulation s/t hematuria, recent hospitalization 11/16-11/20 with similar symptoms and A. fib RVR admitted on 04/12/2019 with N/V, diarrhea, decreased PO intake with sepsis to LLE cellulitis, possible intra-abd source, r/o bacteremia. Hospitalization complicated by acute renal failure and need for vasopressor support. Overall prognosis poor.   I met today at Tiffany Manning bedside. She is somewhat alert but confused. Unable to tell me if she has pain as she says no but then immediately cries/moans that she has pain. Seems to cry/moan when awakened. Only amiodarone going currently. Husband is at bedside and appropriately tearful. Sons should arrive today at some point. Plan at this point is for full comfort care and stopping amio gtt. Husband confirms previous plan for comfort care and transition to hospice care and I will request CSW assistance and hospice referral in preparation for transition.   All questions/concerns addressed. Emotional support provided.   Exam: Minimally responsive, confused. Moaning/crying at times. Breathing regular, unlabored. HR regular 80s. Abd soft. Generalized weakness.   Plan: - Awaiting family to arrive planned for today.  - Plan for transition to hospice facility tomorrow if bed available.  - Prognosis < 2 weeks.   [place], NP Palliative Medicine Team Pager [phone] (Please see [email or web address] for schedule) Team Phone [phone]    Greater than 50%  of this time was spent counseling and coordinating care related to the above assessment and plan

## 2019-04-16 NOTE — Progress Notes (Signed)
Follow up - Critical Care Medicine Note  Patient Details:    Tiffany Manning is an 78 y.o. female with dementia, adult failure to thrive, severe protein calorie malnutrition and recurrent bouts of hematuria.  Admitted with severe sepsis and septic shock.  Patient encephalopathic.  Lines, Airways, Drains: Urethral Catheter Charna ElizabethKatherine Clayton, RN Double-lumen;Latex 14 Fr. (Active)  Indication for Insertion or Continuance of Catheter Acute urinary retention (I&O Cath for 24 hrs prior to catheter insertion- Inpatient Only) 04/14/19 0800  Site Assessment Intact 04/14/19 0800  Catheter Maintenance Bag emptied prior to transport;Seal intact;No dependent loops;Insertion date on drainage bag;Drainage bag/tubing not touching floor;Catheter secured;Bag below level of bladder 04/14/19 0800  Collection Container Standard drainage bag 04/14/19 0800  Securement Method Securing device (Describe) 04/14/19 0800  Urinary Catheter Interventions (if applicable) Unclamped 04/14/19 0400  Output (mL) 150 mL 04/14/19 1228    Anti-infectives:  Anti-infectives (From admission, onward)   Start     Dose/Rate Route Frequency Ordered Stop   04/14/19 0200  vancomycin (VANCOCIN) 500 mg in sodium chloride 0.9 % 100 mL IVPB     500 mg 100 mL/hr over 60 Minutes Intravenous  Once 04/14/19 0114 04/14/19 0433   04/13/19 0600  ceFEPIme (MAXIPIME) 2 g in sodium chloride 0.9 % 100 mL IVPB  Status:  Discontinued     2 g 200 mL/hr over 30 Minutes Intravenous Every 24 hours 04/13/19 0203 04/16/19 1509   04/13/19 0230  metroNIDAZOLE (FLAGYL) IVPB 500 mg  Status:  Discontinued     500 mg 100 mL/hr over 60 Minutes Intravenous Every 8 hours 04/13/19 0201 04/15/19 1103   04/13/19 0203  vancomycin variable dose per unstable renal function (pharmacist dosing)  Status:  Discontinued      Does not apply See admin instructions 04/13/19 0203 04/15/19 1101   04/12/19 2315  vancomycin (VANCOCIN) 1,250 mg in sodium chloride 0.9 % 250 mL IVPB      1,250 mg 166.7 mL/hr over 90 Minutes Intravenous  Once 04/12/19 2306 04/13/19 0047   04/12/19 2145  piperacillin-tazobactam (ZOSYN) IVPB 3.375 g     3.375 g 100 mL/hr over 30 Minutes Intravenous  Once 04/12/19 2136 04/12/19 2241      Microbiology: Results for orders placed or performed during the hospital encounter of 04/12/19  SARS CORONAVIRUS 2 (TAT 6-24 HRS) Nasopharyngeal Nasopharyngeal Swab     Status: None   Collection Time: 04/12/19  9:30 PM   Specimen: Nasopharyngeal Swab  Result Value Ref Range Status   SARS Coronavirus 2 NEGATIVE NEGATIVE Final    Comment: (NOTE) SARS-CoV-2 target nucleic acids are NOT DETECTED. The SARS-CoV-2 RNA is generally detectable in upper and lower respiratory specimens during the acute phase of infection. Negative results do not preclude SARS-CoV-2 infection, do not rule out co-infections with other pathogens, and should not be used as the sole basis for treatment or other patient management decisions. Negative results must be combined with clinical observations, patient history, and epidemiological information. The expected result is Negative. Fact Sheet for Patients: HairSlick.nohttps://www.fda.gov/media/138098/download Fact Sheet for Healthcare Providers: quierodirigir.comhttps://www.fda.gov/media/138095/download This test is not yet approved or cleared by the [place] FDA and  has been authorized for detection and/or diagnosis of SARS-CoV-2 by FDA under an Emergency Use Authorization (EUA). This EUA will remain  in effect (meaning this test can be used) for the duration of the COVID-19 declaration under Section 56 4(b)(1) of the Act, 21 U.S.C. section 360bbb-3(b)(1), unless the authorization is terminated or revoked sooner. Performed at [facility], [place] [address] [place], [place] [address]   Blood Culture (routine x 2)     Status: None (Preliminary result)   Collection Time: 04/12/19  9:38 PM   Specimen: BLOOD  Result Value Ref Range Status    Specimen Description BLOOD Blood Culture adequate volume  Final   Special Requests   Final    BOTTLES DRAWN AEROBIC AND ANAEROBIC LEFT ANTECUBITAL   Culture   Final    NO GROWTH 4 DAYS Performed at [facility], [address], [place], [place] [address]    Report Status PENDING  Incomplete  Blood Culture (routine x 2)     Status: None (Preliminary result)   Collection Time: 04/12/19  9:38 PM   Specimen: BLOOD  Result Value Ref Range Status   Specimen Description BLOOD Blood Culture adequate volume  Final   Special Requests   Final    BOTTLES DRAWN AEROBIC AND ANAEROBIC RIGHT ANTECUBITAL   Culture   Final    NO GROWTH 4 DAYS Performed at [facility], [address], [place], [place] [address]    Report Status PENDING  Incomplete  MRSA PCR Screening     Status: None   Collection Time: 04/13/19  6:11 AM   Specimen: Nasopharyngeal  Result Value Ref Range Status   MRSA by PCR NEGATIVE NEGATIVE Final    Comment:        The GeneXpert MRSA Assay (FDA approved for NASAL specimens only), is one component of a comprehensive MRSA colonization surveillance program. It is not intended to diagnose MRSA infection nor to guide or monitor treatment for MRSA infections. Performed at [facility], [address] [place], [place] [address]   Urine Culture     Status: Abnormal   Collection Time: 04/13/19  4:57 PM   Specimen: Urine, Catheterized  Result Value Ref Range Status   Specimen Description   Final    URINE, CATHETERIZED Performed at [facility], [address], [place], [place] [address]    Special Requests   Final    Normal Performed at [facility], [address] [place], [place] [address]    Culture MULTIPLE SPECIES PRESENT, SUGGEST RECOLLECTION (A)  Final   Report Status 04/15/2019 FINAL  Final    Best Practice/Protocols:  VTE Prophylaxis: Heparin (SQ) GI Prophylaxis: Antihistamine   Events:   Studies: Dg  Chest Port 1 View  Result Date: 04/13/2019 CLINICAL DATA:  78 year old female with lung disease. EXAM: PORTABLE CHEST 1 VIEW COMPARISON:  Chest radiograph dated 02/05/2018. FINDINGS: Shallow inspiration with minimal bibasilar atelectasis. No focal consolidation, pleural effusion, or pneumothorax. A 2 cm nodular density in the left upper lobe was seen on the prior radiographs and corresponds to the nodule seen on the neck CT dated 05/28/2017. Top-normal cardiac silhouette. Atherosclerotic calcification of the aortic arch. No acute osseous pathology. IMPRESSION: 1. No acute cardiopulmonary process. 2. A 2 cm nodular density in the left upper lobe present dating back to 05/28/2017. Electronically Signed   By: Anner Crete M.D.   On: 04/13/2019 18:52   Dg Abdomen Acute W/chest  Result Date: 04/12/2019 CLINICAL DATA:  Nausea vomiting EXAM: DG ABDOMEN ACUTE W/ 1V CHEST COMPARISON:  None. FINDINGS: There is no evidence of dilated bowel loops or free intraperitoneal air. No radiopaque calculi or other significant radiographic abnormality is seen. There is mild cardiomegaly. No large airspace consolidation. Minimal nonspecific patchy airspace opacity seen within the right mid lung. IMPRESSION: Negative abdominal radiographs.  Minimal patchy airspace opacity seen within the right mid lung, which is non-specific could be due scarring and/or atelectasis. Electronically Signed   Jonna Clark  [place]u M.D.   On: 04/12/2019 21:44   Ct Renal Stone Study  Result Date: 03/24/2019 CLINICAL DATA:  Flank pain EXAM: CT ABDOMEN AND PELVIS WITHOUT CONTRAST TECHNIQUE: Multidetector CT imaging of the abdomen and pelvis was performed following the standard protocol without IV contrast. COMPARISON:  Ultrasound 12/24/2018, CT 01/28/2018, 09/19/2011 FINDINGS: Lower chest: Stable nodularity in the right middle lobe. 5 mm right middle lobe pulmonary nodule, decreased compared to prior measurement of 7 mm. No acute consolidation or  effusion. Coronary vascular calcification. Heart size within normal limits. Small hiatal hernia. Hepatobiliary: No focal hepatic abnormality. Status post cholecystectomy. No significant biliary dilatation Pancreas: No acute inflammatory changes. Pancreas is atrophic and contains a few parenchymal calcifications. Fifteen mm hypodense nodule adjacent to or exophytic to the pancreas body, stable to slight increase in size. Spleen: Normal in size without focal abnormality. Adrenals/Urinary Tract: Adrenal glands are normal. Lobulated atrophic left kidney. Moderate left and mild right hydronephrosis, decreased compared to prior study. Left hydroureter with stranding about the distal left ureter. No stones. Mild increased density within the posterior bladder. Small cyst lower pole left kidney. Stomach/Bowel: The stomach is nonenlarged. No dilated small bowel. Collapsed appearance of transverse colon. Possible wall thickening of the ascending colon. Vascular/Lymphatic: Moderate aortic atherosclerosis without aneurysm. No significantly enlarged lymph nodes. Reproductive: Status post hysterectomy. No adnexal masses. Other: Negative for free air or free fluid. Moderate fat containing infraumbilical hernia. Musculoskeletal: No acute or suspicious osseous abnormality. Similar scattered foci of sclerosis within the hips and pelvis. IMPRESSION: 1. Moderate left and mild right hydronephrosis with left hydroureter, though decreased compared to the prior CT. No ureteral stone is seen. Left ureter is dilated down to the level of the bladder. Cortical scarring and atrophy of the left kidney. 2. Mild increased density at the posterior bladder, possible small stones. 3. 15 mm hypodense nodule possibly arising from the pancreatic body. Nonemergent MRI recommended for further evaluation given increasing size as compared with prior exams. 4. 5 mm right middle lobe pulmonary nodule, decreased in size 5. Possible wall thickening of the  ascending colon though without significant surrounding inflammatory change. Correlation with colonoscopy suggested to exclude mass in the region. 6. Moderate fat containing infraumbilical ventral hernia Electronically Signed   By: Jasmine Pang M.D.   On: 03/24/2019 21:31   [place] Ekg Site Rite  Result Date: 04/14/2019 If Site Rite image not attached, placement could not be confirmed due to current cardiac rhythm.   Consults: Treatment Team:  Lamont Dowdy, MD Vida Rigger, MD [facility], Palliative   Subjective:    Overnight Issues: Issues with poor IV access.  Still pressor dependent.  Severe anasarca.  Continues to have issues with encephalopathy.  Objective:  Vital signs for last 24 hours: Temp:  [98.1 F (36.7 C)-98.6 F (37 C)] 98.1 F (36.7 C) (12/09 0832) Pulse Rate:  [71-95] 76 (12/09 1657) Resp:  [8-23] 22 (12/09 1400) BP: (106-135)/(60-79) 113/61 (12/09 1657) SpO2:  [100 %] 100 % (12/09 1657)  Hemodynamic parameters for last 24 hours:    Intake/Output from previous day: 12/08 0701 - 12/09 0700 In: 587.8 [I.V.:520; IV Piggyback:67.8] Out: 350 [Urine:350]  Intake/Output this shift: No intake/output data recorded.  Vent settings for last 24 hours:    Physical Exam:  GENERAL: Chronically ill-appearing, frail HEAD: Normocephalic, atraumatic.  EYES: Pupils equal, round, reactive  to light.  No scleral icterus.  MOUTH: Poor dentition, oral mucosa moist NECK: Supple.  Trachea midline no JVD.  PULMONARY: Bibasilar crackles, coarse breath sounds CARDIOVASCULAR: Irregular rate and rhythm, controlled ventricular response at present GASTROINTESTINAL: Soft, nontender, non-distended.  Bowel sounds present MUSCULOSKELETAL: 3+ anasarca, muscle wasting NEUROLOGIC: Lethargic, mumbles nonsensically.Crying that she needs help.Appears uncomfortable. SKIN:i pressure wounds on lower extremities, fragile skin with weeping due to anasarca  Assessment/Plan:   Encephalopathy Dementia Toxic metabolic encephalopathy superimposed on dementia Encephalopathy likely due to severe sepsis/septic shock Sepsis likely from urinary tract source Transitioning to comfort care Low-dose Haldol as needed for terminal delirium Discontinuing antibiotics  Severe sepsis UTI, recurrent Aspiration pneumonia possible Antibiotics discontinued as patient is transitioning to comfort care  Dysphagia Recurrent aspiration Husband notes that she has been having "choking" episodes over the last year and a half Nursing witnessed aspiration of her own secretions Allow p.o. intake for comfort  Adult Failure to thrive Severe protein calorie malnutrition Poor prognosis due to ongoing adult failure to thrive Allow p.o. intake for comfort  Afib with RVR Discontinued amiodarone as transitioning to comfort care Rate has been relatively controlled  Acute on chronic kidney injury Not a dialysis candidate Discussed with Dr. Pearletha Furl   LOS: 4 days   Additional comments: Discussed during multidisciplinary rounds.  Discussed with patient's husband.  Patient's prognosis is exceedingly poor.  She is  DNR/DNI.  Patient's adult children have come in from out of town.  She has been transitioned to comfort care.  We will transfer to 1C for ongoing comfort measures and palliative care.  Discontinued medications that are not for comfort.  Transitioning to hospitalist service.  Critical Care Total Time*:  C. Danice Goltz, MD [facility] 04/16/2019  *Care during the described time interval was provided by me and/or other providers on the critical care team.  I have reviewed this patient's available data, including medical history, events of note, physical examination and test results as part of my evaluation.  **This note was dictated using voice recognition software/Dragon.  Despite best efforts to proofread, errors can occur which can change the meaning.  Any change  was purely unintentional.

## 2019-04-17 DIAGNOSIS — N170 Acute kidney failure with tubular necrosis: Secondary | ICD-10-CM

## 2019-04-17 LAB — CULTURE, BLOOD (ROUTINE X 2)
Culture: NO GROWTH
Culture: NO GROWTH
Specimen Description: ADEQUATE
Specimen Description: ADEQUATE

## 2019-04-17 MED ORDER — MORPHINE SULFATE (PF) 2 MG/ML IV SOLN
1.0000 mg | INTRAVENOUS | Status: DC | PRN
  Administered 2019-04-17: 15:00:00 1 mg via INTRAVENOUS
  Administered 2019-04-18 (×2): 2 mg via INTRAVENOUS
  Administered 2019-04-18: 1 mg via INTRAVENOUS
  Filled 2019-04-17 (×4): qty 1

## 2019-04-17 NOTE — Progress Notes (Signed)
Palliative:  HPI: 78 y.o.femalewith past medical history of HTN, HLD, recurrent UTI on chronic suppressive antibiotics, diabetes, A. fib (no anticoagulation s/t hematuria, recent hospitalization 11/16-11/20 with similar symptoms and A. fib RVRadmitted on 12/5/2020with N/V, diarrhea, decreased PO intake with sepsis to LLE cellulitis, possible intra-abd source, r/o bacteremia. Hospitalization complicated by acute renal failure and need for vasopressor support.Overall prognosis poor.   I met today at Ms. Jake Shark bedside. She is resting comfortably and has recently received morphine. Family are gathered at bedside and are pleased with her care. They have had a great day with her and she has been alert and interactive with them and family are grateful for this time. They are hopeful for transition to hospice facility. One more son to arrive from [place]. All questions/concerns addressed. Emotional support provided. To family at bedside.   Exam: Resting comfortably. No distress. Breathing regular but shallow. I did not awaken.   Plan: - Full comfort care - No changes to orders - Liberal PRN medications available to ensure comfort - Awaiting transition to hospice facility - Prognosis < 2 weeks and likely days  20 min  Vinie Sill, NP Palliative Medicine Team Pager [phone] (Please see [email or web address] for schedule) Team Phone [phone]    Greater than 50%  of this time was spent counseling and coordinating care related to the above assessment and plan

## 2019-04-17 NOTE — Progress Notes (Signed)
Follow up visit made. Patient seen lying in bed, eyes closed, sons and daughter in law present. Patient required a PRN dose of lorazepam prior to this visit. She awaken during visit and appeared uncomfortable with crying and moaning. Staff RN Maia Breslow made aware.PRN morphine to be given. Writer will continue to follow.  Approval received from hospice medical director. Will plan on transfer to the hospice home when bed is available.  Emotional support given to family. Flo Shanks BSN, RN, [place] [phone]

## 2019-04-17 NOTE — Progress Notes (Addendum)
PROGRESS NOTE                                                                                                                                                                                                             Patient Demographics:    Tiffany Manning, is a 78 y.o. 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 fib not on anticoagulant due to hematuria with recent hospitalizations, diabetes mellitus, hypertension and hyperlipidemia hospitalized on 12/5 with diarrhea, nausea and vomiting with poor p.o. intake and acute encephalopathy. Since her recent discharge she was not felt to be back to her baseline with persistent nausea vomiting and intermittent diarrhea with progressive weakness and not able to participate with PT. Patient was found to be in septic shock with ATN.  Patient initially admitted to ICU but despite adequate hydration and antibiotics symptoms did not improve and after discussion with family was transitioned to comfort measures.  Seen by nephrology for progressive ATN and deemed not a candidate for dialysis.    Subjective:   Patient hard of hearing and able to answer simple questions.  Denies being on pain at present.   Assessment  & Plan :   Principal problem Severe sepsis with septic shock Suspect urinary source and possible aspiration pneumonia. antibiotic now discontinued given worsening prognosis and goal for comfort.  Family involved in care and agree on comfort measures only with plan to discharge her to residential hospice. Palliative care consult  appreciated. No blood draws, antibiotic or fluids.  IV morphine as needed for pain control, IV Ativan as needed for anxiety, Haldol as needed for agitation.  Added Robinul to help with secretions. Continue Foley for comfort.   Active Problems:   Atrial fibrillation with RVR (HCC)   Hypercholesteremia   Leg wound, left   DM (diabetes mellitus), type 2 with renal complications (HCC)   AKI (acute kidney injury) (HCC)   Metabolic acidosis Cellulitis of leg   Goals of care, counseling/discussion  Code Status : DNR, comfort measures  Family Communication  : Husband and sister at bedside  Disposition Plan  : Residential hospice    Consults  : PCCM, ID, palliative care   DVT Prophylaxis  : Comfort measures  Lab Results  Component Value Date   PLT 121 (L) 04/15/2019    Antibiotics  :    Anti-infectives (From admission, onward)   Start     Dose/Rate Route Frequency Ordered Stop   04/14/19 0200  vancomycin (VANCOCIN) 500 mg in sodium chloride 0.9 % 100 mL IVPB     500 mg 100 mL/hr over 60 Minutes Intravenous  Once 04/14/19 0114 04/14/19 0433   04/13/19 0600  ceFEPIme (MAXIPIME) 2 g in sodium chloride 0.9 % 100 mL IVPB  Status:  Discontinued     2 g 200 mL/hr over 30 Minutes Intravenous Every 24 hours 04/13/19 0203 04/16/19 1509   04/13/19 0230  metroNIDAZOLE (FLAGYL) IVPB 500 mg  Status:  Discontinued     500 mg 100 mL/hr over 60 Minutes Intravenous Every 8 hours 04/13/19 0201 04/15/19 1103   04/13/19 0203  vancomycin variable dose per unstable renal function (pharmacist dosing)  Status:  Discontinued      Does not apply See admin instructions 04/13/19 0203 04/15/19 1101   04/12/19 2315  vancomycin (VANCOCIN) 1,250 mg in sodium chloride 0.9 % 250 mL IVPB     1,250 mg 166.7 mL/hr over 90 Minutes Intravenous  Once 04/12/19 2306 04/13/19 0047   04/12/19 2145  piperacillin-tazobactam (ZOSYN) IVPB 3.375 g     3.375 g 100 mL/hr over 30 Minutes Intravenous  Once 04/12/19 2136  04/12/19 2241        Objective:   Vitals:   04/16/19 1300 04/16/19 1400 04/16/19 1657 04/17/19 0536  BP: 128/70 135/71 113/61 139/75  Pulse: 83 71 76 80  Resp: (!) 23 (!) 22    Temp:    98.8 F (37.1 C)  TempSrc:    Axillary  SpO2: 100% 100% 100% 100%  Weight:      Height:        Wt Readings from Last 3 Encounters:  04/12/19 59 kg  03/28/19 59 kg  12/26/18 61.2 kg     Intake/Output Summary (Last 24 hours) at 04/17/2019 1335 Last data filed at 04/17/2019 0937 Gross per 24 hour  Intake --  Output 550 ml  Net -550 ml     Physical Exam  Gen: Resting comfortably, fatigued, not in distress HEENT: Moist mucosa Chest: clear b/l,  CVS: N S1&S2,  GI: soft, NT, ND   Data Review:    CBC Recent Labs  Lab 04/12/19 2137 04/13/19 0422 04/14/19 0430 04/15/19 0338  WBC 16.1* 20.0* 18.6* 10.1  HGB 12.8 9.2* 9.4* 7.9*  HCT 41.8 30.9* 30.7* 25.4*  PLT 292 218 170 121*  MCV 113.3* 114.9* 110.8* 108.5*  MCH 34.7* 34.2* 33.9 33.8  MCHC 30.6 29.8* 30.6 31.1  RDW 14.6 14.6 15.4 15.5  LYMPHSABS 2.1  --   --   --   MONOABS 0.4  --   --   --   EOSABS 0.0  --   --   --   BASOSABS 0.0  --   --   --     Chemistries  Recent Labs  Lab 04/12/19 2137 04/13/19 0422 04/14/19 0430 04/15/19 0338  NA 140 142 144 143  K 4.6 3.6 3.2* 2.9*  CL 107 111 104 101  CO2 <7* <7* 14* 23  GLUCOSE  216* 369* 247* 176*  BUN 52* 44* 44* 40*  CREATININE 3.48* 2.90* 3.32* 2.81*  CALCIUM 8.3* 6.2* 6.4* 6.0*  MG  --   --  1.7 1.9  AST 31  --   --   --   ALT 38  --   --   --   ALKPHOS 92  --   --   --   BILITOT 1.0  --   --   --    ------------------------------------------------------------------------------------------------------------------ No results for input(s): CHOL, HDL, LDLCALC, TRIG, CHOLHDL, LDLDIRECT in the last 72 hours.  Lab Results  Component Value Date   HGBA1C 6.4 (H) 03/26/2019    ------------------------------------------------------------------------------------------------------------------ No results for input(s): TSH, T4TOTAL, T3FREE, THYROIDAB in the last 72 hours.  Invalid input(s): FREET3 ------------------------------------------------------------------------------------------------------------------ No results for input(s): VITAMINB12, FOLATE, FERRITIN, TIBC, IRON, RETICCTPCT in the last 72 hours.  Coagulation profile Recent Labs  Lab 04/12/19 2137  INR 1.1    No results for input(s): DDIMER in the last 72 hours.  Cardiac Enzymes No results for input(s): CKMB, TROPONINI, MYOGLOBIN in the last 168 hours.  Invalid input(s): CK ------------------------------------------------------------------------------------------------------------------ No results found for: BNP  Inpatient Medications  Scheduled Meds:  Chlorhexidine Gluconate Cloth  6 each Topical Daily   mouth rinse  15 mL Mouth Rinse q12n4p   Continuous Infusions: PRN Meds:.acetaminophen, glycopyrrolate, haloperidol lactate, LORazepam, morphine injection  Micro Results Recent Results (from the past 240 hour(s))  SARS CORONAVIRUS 2 (TAT 6-24 HRS) Nasopharyngeal Nasopharyngeal Swab     Status: None   Collection Time: 04/12/19  9:30 PM   Specimen: Nasopharyngeal Swab  Result Value Ref Range Status   SARS Coronavirus 2 NEGATIVE NEGATIVE Final    Comment: (NOTE) SARS-CoV-2 target nucleic acids are NOT DETECTED. The SARS-CoV-2 RNA is generally detectable in upper and lower respiratory specimens during the acute phase of infection. Negative results do not preclude SARS-CoV-2 infection, do not rule out co-infections with other pathogens, and should not be used as the sole basis for treatment or other patient management decisions. Negative results must be combined with clinical observations, patient history, and epidemiological information. The expected result is Negative. Fact Sheet  for Patients: SugarRoll.be Fact Sheet for Healthcare Providers: https://www.woods-mathews.com/ This test is not yet approved or cleared by the [place] FDA and  has been authorized for detection and/or diagnosis of SARS-CoV-2 by FDA under an Emergency Use Authorization (EUA). This EUA will remain  in effect (meaning this test can be used) for the duration of the COVID-19 declaration under Section 56 4(b)(1) of the Act, 21 U.S.C. section 360bbb-3(b)(1), unless the authorization is terminated or revoked sooner. Performed at [facility], [place] [address], [place], [place] [address]   Blood Culture (routine x 2)     Status: None   Collection Time: 04/12/19  9:38 PM   Specimen: BLOOD  Result Value Ref Range Status   Specimen Description BLOOD Blood Culture adequate volume  Final   Special Requests   Final    BOTTLES DRAWN AEROBIC AND ANAEROBIC LEFT ANTECUBITAL   Culture   Final    NO GROWTH 5 DAYS Performed at [facility], [address], [place], [place] [address]    Report Status 04/17/2019 FINAL  Final  Blood Culture (routine x 2)     Status: None   Collection Time: 04/12/19  9:38 PM   Specimen: BLOOD  Result Value Ref Range Status   Specimen Description BLOOD Blood Culture adequate volume  Final   Special  Requests   Final    BOTTLES DRAWN AEROBIC AND ANAEROBIC RIGHT ANTECUBITAL   Culture   Final    NO GROWTH 5 DAYS Performed at [facility], [address] [place], [place] [address]    Report Status 04/17/2019 FINAL  Final  MRSA PCR Screening     Status: None   Collection Time: 04/13/19  6:11 AM   Specimen: Nasopharyngeal  Result Value Ref Range Status   MRSA by PCR NEGATIVE NEGATIVE Final    Comment:        The GeneXpert MRSA Assay (FDA approved for NASAL specimens only), is one component of a comprehensive MRSA colonization surveillance program. It is not intended to diagnose MRSA infection nor  to guide or monitor treatment for MRSA infections. Performed at [facility], [address] [place], [place] [address]   Urine Culture     Status: Abnormal   Collection Time: 04/13/19  4:57 PM   Specimen: Urine, Catheterized  Result Value Ref Range Status   Specimen Description   Final    URINE, CATHETERIZED Performed at [facility], [address] [place], [place] [address]    Special Requests   Final    Normal Performed at [facility], [address] [place], [place] [address]    Culture MULTIPLE SPECIES PRESENT, SUGGEST RECOLLECTION (A)  Final   Report Status 04/15/2019 FINAL  Final    Radiology Reports DG Chest Port 1 View  Result Date: 04/13/2019 CLINICAL DATA:  78 year old female with lung disease. EXAM: PORTABLE CHEST 1 VIEW COMPARISON:  Chest radiograph dated 02/05/2018. FINDINGS: Shallow inspiration with minimal bibasilar atelectasis. No focal consolidation, pleural effusion, or pneumothorax. A 2 cm nodular density in the left upper lobe was seen on the prior radiographs and corresponds to the nodule seen on the neck CT dated 05/28/2017. Top-normal cardiac silhouette. Atherosclerotic calcification of the aortic arch. No acute osseous pathology. IMPRESSION: 1. No acute cardiopulmonary process. 2. A 2 cm nodular density in the left upper lobe present dating back to 05/28/2017. Electronically Signed   By: Elgie CollardArash  Radparvar M.D.   On: 04/13/2019 18:52   DG Abdomen Acute W/Chest  Result Date: 04/12/2019 CLINICAL DATA:  Nausea vomiting EXAM: DG ABDOMEN ACUTE W/ 1V CHEST COMPARISON:  None. FINDINGS: There is no evidence of dilated bowel loops or free intraperitoneal air. No radiopaque calculi or other significant radiographic abnormality is seen. There is mild cardiomegaly. No large airspace consolidation. Minimal nonspecific patchy airspace opacity seen within the right mid lung. IMPRESSION: Negative abdominal radiographs. Minimal patchy airspace  opacity seen within the right mid lung, which is non-specific could be due scarring and/or atelectasis. Electronically Signed   By: Jonna ClarkBindu  Avutu M.D.   On: 04/12/2019 21:44   CT RENAL STONE STUDY  Result Date: 03/24/2019 CLINICAL DATA:  Flank pain EXAM: CT ABDOMEN AND PELVIS WITHOUT CONTRAST TECHNIQUE: Multidetector CT imaging of the abdomen and pelvis was performed following the standard protocol without IV contrast. COMPARISON:  Ultrasound 12/24/2018, CT 01/28/2018, 09/19/2011 FINDINGS: Lower chest: Stable nodularity in the right middle lobe. 5 mm right middle lobe pulmonary nodule, decreased compared to prior measurement of 7 mm. No acute consolidation or effusion. Coronary vascular calcification. Heart size within normal limits. Small hiatal hernia. Hepatobiliary: No focal hepatic abnormality. Status post cholecystectomy. No significant biliary dilatation Pancreas: No acute inflammatory changes. Pancreas is atrophic and contains a few parenchymal calcifications. Fifteen mm hypodense nodule adjacent to or exophytic to the pancreas body, stable to slight  increase in size. Spleen: Normal in size without focal abnormality. Adrenals/Urinary Tract: Adrenal glands are normal. Lobulated atrophic left kidney. Moderate left and mild right hydronephrosis, decreased compared to prior study. Left hydroureter with stranding about the distal left ureter. No stones. Mild increased density within the posterior bladder. Small cyst lower pole left kidney. Stomach/Bowel: The stomach is nonenlarged. No dilated small bowel. Collapsed appearance of transverse colon. Possible wall thickening of the ascending colon. Vascular/Lymphatic: Moderate aortic atherosclerosis without aneurysm. No significantly enlarged lymph nodes. Reproductive: Status post hysterectomy. No adnexal masses. Other: Negative for free air or free fluid. Moderate fat containing infraumbilical hernia. Musculoskeletal: No acute or suspicious osseous abnormality.  Similar scattered foci of sclerosis within the hips and pelvis. IMPRESSION: 1. Moderate left and mild right hydronephrosis with left hydroureter, though decreased compared to the prior CT. No ureteral stone is seen. Left ureter is dilated down to the level of the bladder. Cortical scarring and atrophy of the left kidney. 2. Mild increased density at the posterior bladder, possible small stones. 3. 15 mm hypodense nodule possibly arising from the pancreatic body. Nonemergent MRI recommended for further evaluation given increasing size as compared with prior exams. 4. 5 mm right middle lobe pulmonary nodule, decreased in size 5. Possible wall thickening of the ascending colon though without significant surrounding inflammatory change. Correlation with colonoscopy suggested to exclude mass in the region. 6. Moderate fat containing infraumbilical ventral hernia Electronically Signed   By: Jasmine Pang M.D.   On: 03/24/2019 21:31   [place] EKG SITE RITE  Result Date: 04/14/2019 If Site Rite image not attached, placement could not be confirmed due to current cardiac rhythm.   Time Spent in minutes  25   Maria Gallicchio M.D on 04/17/2019 at 1:35 PM  Between 7am to 7pm - Pager - [phone]  After 7pm go to [email or web address] - password [facility]  [facility] -  Office  [phone]

## 2019-04-17 NOTE — Progress Notes (Signed)
New referral for [employer] home received from [place]. There is currently no bed availability. CSW Randall Hiss and family aware. Writer spoke to patient's husband outside of the room as patient was surrounded by her children and they were all reminiscing. Patient was alert and interactive with family. She has not required any PRN medications since midnight. Writer to speak with Mr. Verley later in the day to review hospice services. Patient information sent to referral for hospice Medical director review. Flo Shanks BSN, RN, [place] [phone]

## 2019-04-17 NOTE — Care Management Important Message (Signed)
Important Message  Patient Details  Name: Tiffany Manning MRN: [id number] Date of Birth: 22-Apr-1941   Medicare Important Message Given:  Yes     Juliann Pulse A Salvatore Shear 04/17/2019, 11:12 AM

## 2019-04-17 NOTE — Progress Notes (Signed)
CSW received call from patients daughter, Eyvonne Mechanic, regarding hospice- family prefers [facility]. Notified Karen with [facility]. Will notified [facility] department as well due to this writer not working at [facility] today.   Kingsley Spittle, LCSW [facility]  [phone]

## 2019-04-17 NOTE — Progress Notes (Signed)
   04/17/19 1100  Clinical Encounter Type  Visited With Patient and family together  Visit Type Spiritual support  Spiritual Encounters  Spiritual Needs Sacred text;Prayer;Emotional  Ch was rounding. Husband, children and family were congregated at the bedside. Ch offered to read Psalm 27 and provided a prayer. Ch also engaged in life review and encouraged the family to share memories of the pt. 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 Family received the pastoral support very well and appreciated the visit.

## 2019-04-18 MED ORDER — MORPHINE SULFATE (PF) 2 MG/ML IV SOLN
2.0000 mg | INTRAVENOUS | Status: DC | PRN
  Administered 2019-04-18 – 2019-04-22 (×13): 2 mg via INTRAVENOUS
  Filled 2019-04-18 (×13): qty 1

## 2019-04-18 MED ORDER — LORAZEPAM 2 MG/ML IJ SOLN
1.0000 mg | Freq: Every day | INTRAMUSCULAR | Status: DC
  Administered 2019-04-18 – 2019-04-21 (×4): 1 mg via INTRAVENOUS
  Filled 2019-04-18 (×4): qty 1

## 2019-04-18 NOTE — TOC Progression Note (Signed)
Transition of Care [facility]) - Progression Note    Patient Details  Name: Tiffany Manning MRN: [id number] Date of Birth: [birth year]  Transition of [facility]) CM/SW Contact  Ross Ludwig, [place] Phone Number: 04/18/2019, 12:36 PM  Clinical Narrative:    CSW was informed that patient still does not have a bed available at the hospice home.  CSW continuing to follow patient's progress throughout discharge planning.      Expected Discharge Plan and Services                                                 Social Determinants of Health (SDOH) Interventions    Readmission Risk Interventions No flowsheet data found.

## 2019-04-18 NOTE — Progress Notes (Signed)
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 fib (no anticoagulation s/t hematuria, recent hospitalization 11/16-11/20 with similar symptoms and A. fib RVRadmitted on 12/5/2020with N/V, diarrhea, decreased PO intake with sepsis to LLE cellulitis, possible intra-abd source, r/o bacteremia. Hospitalization complicated by acute renal failure and need for vasopressor support.Overall prognosis poor. Prognosis likely days.   I met today at Ms. Gayton bedside with her husband and daughter. She is resting comfortably. They tell me that she was uncomfortable briefly prior to PRN medication. We discussed the benefits vs limitations of morphine infusion. They do feel like they are having meaningful interactions with her when alert and reassure me that she is not always crying and moaning when awake. We discussed the option or morphine infusion if she appears uncomfortable when awake and requiring frequent PRN morphine doses. They do not feel she needs morphine infusion at this time and are enjoying the good moments they are getting with her. They do feel she has her days/nights confused which is very likely and I will give qhs dose ativan to assist with insomnia. Gave daughter Gaylyn [employer] booklet and discussed signs of progression at EOL.   All questions/concerns addressed. Emotional support provided.   Exam: Sleeping comfortably. Pale. No apnea noted. Breathing regular, unlabored, shallow.   Plan: - Awaiting hospice bed.  - Ativan 1 mg IV qhs.  - Increased morphine 2-4 mg IV every 2 hours prn.   [place], NP Palliative Medicine Team Pager [phone] (Please see [email or web address] for schedule) Team Phone [phone]    Greater than 50%  of this time was spent counseling and coordinating care related to the above assessment and plan

## 2019-04-18 NOTE — Progress Notes (Signed)
Follow up visit made to new referral for [facility]. Patient appeared to be resting comfortably, husband and daughter [name] present at bedside. Writer was able to initiate education regarding hospice services, philosophy, team approach to care and current visitation policy with understanding voiced. Family and hospital care team aware that there is currently no bed availability at the hospice home today.  Hospice Home staff will contact weekend TOC if a bed becomes available. Flo Shanks BSN, RN, [place] [phone]

## 2019-04-18 NOTE — Progress Notes (Signed)
PROGRESS NOTE                                                                                                                                                                                                             Patient Demographics:    Tiffany Manning, is a 78 y.o. female, DOB - 11-18-1940, WUJ:[id number]  Admit date - 04/12/2019   Admitting Physician Anselm Jungling, DO  Outpatient Primary MD for the patient is Jaclyn Shaggy, MD  LOS - 6  Outpatient Specialists:  Chief Complaint  Patient presents with  . Nausea  . 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 fib not on anticoagulant due to hematuria with recent hospitalizations, diabetes mellitus, hypertension and hyperlipidemia hospitalized on 12/5 with diarrhea, nausea and vomiting with poor p.o. intake and acute encephalopathy. Since her recent discharge she was not felt to be back to her baseline with persistent nausea vomiting and intermittent diarrhea with progressive weakness and not able to participate with PT. Patient was found to be in septic shock with ATN.  Patient initially admitted to ICU but despite adequate hydration and antibiotics symptoms did not improve and after discussion with family was transitioned to comfort measures.  Seen by nephrology for progressive ATN and deemed not a candidate for dialysis.    Subjective:   No overnight events.   Assessment  & Plan :   Principal problem Severe sepsis with septic shock Suspect urinary source and possible aspiration pneumonia. antibiotic now discontinued given worsening prognosis and goal for comfort.  Family involved in care and agree on comfort measures only.  Awaiting bed at residential hospice.  Palliative care consult appreciated. No blood draws, antibiotic or fluids.  IV morphine as needed for pain control, IV  Ativan as needed for anxiety, Haldol as needed for agitation.  Added Robinul to help with secretions. Continue Foley for comfort.   Active Problems:   Atrial fibrillation with RVR (HCC)   Hypercholesteremia   Leg wound, left   DM (diabetes mellitus), type 2 with renal complications (HCC)   AKI (acute kidney injury) (HCC)   Metabolic acidosis Cellulitis of leg   Goals of care, counseling/discussion     Code Status : DNR, comfort measures  Family Communication  :  Husband and sister at bedside  Disposition Plan  : Residential hospice    Consults  : PCCM, ID, palliative care   DVT Prophylaxis  : Comfort measures  Lab Results  Component Value Date   PLT 121 (L) 04/15/2019    Antibiotics  :    Anti-infectives (From admission, onward)   Start     Dose/Rate Route Frequency Ordered Stop   04/14/19 0200  vancomycin (VANCOCIN) 500 mg in sodium chloride 0.9 % 100 mL IVPB     500 mg 100 mL/hr over 60 Minutes Intravenous  Once 04/14/19 0114 04/14/19 0433   04/13/19 0600  ceFEPIme (MAXIPIME) 2 g in sodium chloride 0.9 % 100 mL IVPB  Status:  Discontinued     2 g 200 mL/hr over 30 Minutes Intravenous Every 24 hours 04/13/19 0203 04/16/19 1509   04/13/19 0230  metroNIDAZOLE (FLAGYL) IVPB 500 mg  Status:  Discontinued     500 mg 100 mL/hr over 60 Minutes Intravenous Every 8 hours 04/13/19 0201 04/15/19 1103   04/13/19 0203  vancomycin variable dose per unstable renal function (pharmacist dosing)  Status:  Discontinued      Does not apply See admin instructions 04/13/19 0203 04/15/19 1101   04/12/19 2315  vancomycin (VANCOCIN) 1,250 mg in sodium chloride 0.9 % 250 mL IVPB     1,250 mg 166.7 mL/hr over 90 Minutes Intravenous  Once 04/12/19 2306 04/13/19 0047   04/12/19 2145  piperacillin-tazobactam (ZOSYN) IVPB 3.375 g     3.375 g 100 mL/hr over 30 Minutes Intravenous  Once 04/12/19 2136 04/12/19 2241        Objective:   Vitals:   04/16/19 1400 04/16/19 1657 04/17/19 0536  04/18/19 0507  BP: 135/71 113/61 139/75 135/63  Pulse: 71 76 80 69  Resp: (!) 22     Temp:   98.8 F (37.1 C) 98.6 F (37 C)  TempSrc:   Axillary Oral  SpO2: 100% 100% 100% 100%  Weight:      Height:        Wt Readings from Last 3 Encounters:  04/12/19 59 kg  03/28/19 59 kg  12/26/18 61.2 kg    No intake or output data in the 24 hours ending 04/18/19 1456  Physical exam Not in distress Moist mucosa Chest: Clear CVs: Normal S1-S2 GI: Soft, nondistended, nontender    Data Review:    CBC Recent Labs  Lab 04/12/19 2137 04/13/19 0422 04/14/19 0430 04/15/19 0338  WBC 16.1* 20.0* 18.6* 10.1  HGB 12.8 9.2* 9.4* 7.9*  HCT 41.8 30.9* 30.7* 25.4*  PLT 292 218 170 121*  MCV 113.3* 114.9* 110.8* 108.5*  MCH 34.7* 34.2* 33.9 33.8  MCHC 30.6 29.8* 30.6 31.1  RDW 14.6 14.6 15.4 15.5  LYMPHSABS 2.1  --   --   --   MONOABS 0.4  --   --   --   EOSABS 0.0  --   --   --   BASOSABS 0.0  --   --   --     Chemistries  Recent Labs  Lab 04/12/19 2137 04/13/19 0422 04/14/19 0430 04/15/19 0338  NA 140 142 144 143  K 4.6 3.6 3.2* 2.9*  CL 107 111 104 101  CO2 <7* <7* 14* 23  GLUCOSE 216* 369* 247* 176*  BUN 52* 44* 44* 40*  CREATININE 3.48* 2.90* 3.32* 2.81*  CALCIUM 8.3* 6.2* 6.4* 6.0*  MG  --   --  1.7 1.9  AST 31  --   --   --  ALT 38  --   --   --   ALKPHOS 92  --   --   --   BILITOT 1.0  --   --   --    ------------------------------------------------------------------------------------------------------------------ No results for input(s): CHOL, HDL, LDLCALC, TRIG, CHOLHDL, LDLDIRECT in the last 72 hours.  Lab Results  Component Value Date   HGBA1C 6.4 (H) 03/26/2019   ------------------------------------------------------------------------------------------------------------------ No results for input(s): TSH, T4TOTAL, T3FREE, THYROIDAB in the last 72 hours.  Invalid input(s):  FREET3 ------------------------------------------------------------------------------------------------------------------ No results for input(s): VITAMINB12, FOLATE, FERRITIN, TIBC, IRON, RETICCTPCT in the last 72 hours.  Coagulation profile Recent Labs  Lab 04/12/19 2137  INR 1.1    No results for input(s): DDIMER in the last 72 hours.  Cardiac Enzymes No results for input(s): CKMB, TROPONINI, MYOGLOBIN in the last 168 hours.  Invalid input(s): CK ------------------------------------------------------------------------------------------------------------------ No results found for: BNP  Inpatient Medications  Scheduled Meds: . Chlorhexidine Gluconate Cloth  6 each Topical Daily  . LORazepam  1 mg Intravenous QHS  . mouth rinse  15 mL Mouth Rinse q12n4p   Continuous Infusions: PRN Meds:.acetaminophen, glycopyrrolate, haloperidol lactate, LORazepam, morphine injection  Micro Results Recent Results (from the past 240 hour(s))  SARS CORONAVIRUS 2 (TAT 6-24 HRS) Nasopharyngeal Nasopharyngeal Swab     Status: None   Collection Time: 04/12/19  9:30 PM   Specimen: Nasopharyngeal Swab  Result Value Ref Range Status   SARS Coronavirus 2 NEGATIVE NEGATIVE Final    Comment: (NOTE) SARS-CoV-2 target nucleic acids are NOT DETECTED. The SARS-CoV-2 RNA is generally detectable in upper and lower respiratory specimens during the acute phase of infection. Negative results do not preclude SARS-CoV-2 infection, do not rule out co-infections with other pathogens, and should not be used as the sole basis for treatment or other patient management decisions. Negative results must be combined with clinical observations, patient history, and epidemiological information. The expected result is Negative. Fact Sheet for Patients: HairSlick.no Fact Sheet for Healthcare Providers: quierodirigir.com This test is not yet approved or cleared by  the [place] FDA and  has been authorized for detection and/or diagnosis of SARS-CoV-2 by FDA under an Emergency Use Authorization (EUA). This EUA will remain  in effect (meaning this test can be used) for the duration of the COVID-19 declaration under Section 56 4(b)(1) of the Act, 21 U.S.C. section 360bbb-3(b)(1), unless the authorization is terminated or revoked sooner. Performed at [facility], [address] [place], [place] [address]   Blood Culture (routine x 2)     Status: None   Collection Time: 04/12/19  9:38 PM   Specimen: BLOOD  Result Value Ref Range Status   Specimen Description BLOOD Blood Culture adequate volume  Final   Special Requests   Final    BOTTLES DRAWN AEROBIC AND ANAEROBIC LEFT ANTECUBITAL   Culture   Final    NO GROWTH 5 DAYS Performed at [facility], [address], [place], [place] [address]    Report Status 04/17/2019 FINAL  Final  Blood Culture (routine x 2)     Status: None   Collection Time: 04/12/19  9:38 PM   Specimen: BLOOD  Result Value Ref Range Status   Specimen Description BLOOD Blood Culture adequate volume  Final   Special Requests   Final    BOTTLES DRAWN AEROBIC AND ANAEROBIC RIGHT ANTECUBITAL   Culture   Final    NO GROWTH 5 DAYS Performed at [facility], [address] [place],  [place] [address]    Report Status 04/17/2019 FINAL  Final  MRSA PCR Screening     Status: None   Collection Time: 04/13/19  6:11 AM   Specimen: Nasopharyngeal  Result Value Ref Range Status   MRSA by PCR NEGATIVE NEGATIVE Final    Comment:        The GeneXpert MRSA Assay (FDA approved for NASAL specimens only), is one component of a comprehensive MRSA colonization surveillance program. It is not intended to diagnose MRSA infection nor to guide or monitor treatment for MRSA infections. Performed at [facility], [address] [place], [place] [address]   Urine Culture     Status: Abnormal    Collection Time: 04/13/19  4:57 PM   Specimen: Urine, Catheterized  Result Value Ref Range Status   Specimen Description   Final    URINE, CATHETERIZED Performed at [facility], [address], [place], [place] [address]    Special Requests   Final    Normal Performed at [facility], [address] [place], [place] [address]    Culture MULTIPLE SPECIES PRESENT, SUGGEST RECOLLECTION (A)  Final   Report Status 04/15/2019 FINAL  Final    Radiology Reports DG Chest Port 1 View  Result Date: 04/13/2019 CLINICAL DATA:  78 year old female with lung disease. EXAM: PORTABLE CHEST 1 VIEW COMPARISON:  Chest radiograph dated 02/05/2018. FINDINGS: Shallow inspiration with minimal bibasilar atelectasis. No focal consolidation, pleural effusion, or pneumothorax. A 2 cm nodular density in the left upper lobe was seen on the prior radiographs and corresponds to the nodule seen on the neck CT dated 05/28/2017. Top-normal cardiac silhouette. Atherosclerotic calcification of the aortic arch. No acute osseous pathology. IMPRESSION: 1. No acute cardiopulmonary process. 2. A 2 cm nodular density in the left upper lobe present dating back to 05/28/2017. Electronically Signed   By: Elgie Collard M.D.   On: 04/13/2019 18:52   DG Abdomen Acute W/Chest  Result Date: 04/12/2019 CLINICAL DATA:  Nausea vomiting EXAM: DG ABDOMEN ACUTE W/ 1V CHEST COMPARISON:  None. FINDINGS: There is no evidence of dilated bowel loops or free intraperitoneal air. No radiopaque calculi or other significant radiographic abnormality is seen. There is mild cardiomegaly. No large airspace consolidation. Minimal nonspecific patchy airspace opacity seen within the right mid lung. IMPRESSION: Negative abdominal radiographs. Minimal patchy airspace opacity seen within the right mid lung, which is non-specific could be due scarring and/or atelectasis. Electronically Signed   By: Jonna Clark M.D.   On: 04/12/2019 21:44   CT  RENAL STONE STUDY  Result Date: 03/24/2019 CLINICAL DATA:  Flank pain EXAM: CT ABDOMEN AND PELVIS WITHOUT CONTRAST TECHNIQUE: Multidetector CT imaging of the abdomen and pelvis was performed following the standard protocol without IV contrast. COMPARISON:  Ultrasound 12/24/2018, CT 01/28/2018, 09/19/2011 FINDINGS: Lower chest: Stable nodularity in the right middle lobe. 5 mm right middle lobe pulmonary nodule, decreased compared to prior measurement of 7 mm. No acute consolidation or effusion. Coronary vascular calcification. Heart size within normal limits. Small hiatal hernia. Hepatobiliary: No focal hepatic abnormality. Status post cholecystectomy. No significant biliary dilatation Pancreas: No acute inflammatory changes. Pancreas is atrophic and contains a few parenchymal calcifications. Fifteen mm hypodense nodule adjacent to or exophytic to the pancreas body, stable to slight increase in size. Spleen: Normal in size without focal abnormality. Adrenals/Urinary Tract: Adrenal glands are normal. Lobulated atrophic left kidney. Moderate left and mild right hydronephrosis, decreased compared to prior study. Left hydroureter with stranding about the  distal left ureter. No stones. Mild increased density within the posterior bladder. Small cyst lower pole left kidney. Stomach/Bowel: The stomach is nonenlarged. No dilated small bowel. Collapsed appearance of transverse colon. Possible wall thickening of the ascending colon. Vascular/Lymphatic: Moderate aortic atherosclerosis without aneurysm. No significantly enlarged lymph nodes. Reproductive: Status post hysterectomy. No adnexal masses. Other: Negative for free air or free fluid. Moderate fat containing infraumbilical hernia. Musculoskeletal: No acute or suspicious osseous abnormality. Similar scattered foci of sclerosis within the hips and pelvis. IMPRESSION: 1. Moderate left and mild right hydronephrosis with left hydroureter, though decreased compared to the  prior CT. No ureteral stone is seen. Left ureter is dilated down to the level of the bladder. Cortical scarring and atrophy of the left kidney. 2. Mild increased density at the posterior bladder, possible small stones. 3. 15 mm hypodense nodule possibly arising from the pancreatic body. Nonemergent MRI recommended for further evaluation given increasing size as compared with prior exams. 4. 5 mm right middle lobe pulmonary nodule, decreased in size 5. Possible wall thickening of the ascending colon though without significant surrounding inflammatory change. Correlation with colonoscopy suggested to exclude mass in the region. 6. Moderate fat containing infraumbilical ventral hernia Electronically Signed   By: Donavan Foil M.D.   On: 03/24/2019 21:31   [place] EKG SITE RITE  Result Date: 04/14/2019 If Site Rite image not attached, placement could not be confirmed due to current cardiac rhythm.   Time Spent in minutes 15   Channell Quattrone M.D on 04/18/2019 at 2:56 PM  Between 7am to 7pm - Pager - [phone]  After 7pm go to [email or web address] - password [facility]  [facility] -  Office  [phone]

## 2019-04-19 NOTE — TOC Progression Note (Signed)
Transition of Care [facility]) - Progression Note    Patient Details  Name: Tiffany Manning MRN: [id number] Date of Birth: [birth year]  [facility] of [facility] CM/SW Contact  Marshell Garfinkel, RN Phone Number: 04/19/2019, 8:00 AM  Clinical Narrative:     RNCM spoke with [facility]- no current beds available.    Barriers to Discharge: Hospice Bed not available  Expected Discharge Plan and Services                                                 Social Determinants of Health (SDOH) Interventions    Readmission Risk Interventions No flowsheet data found.

## 2019-04-19 NOTE — Progress Notes (Signed)
PROGRESS NOTE                                                                                                                                                                                                             Patient Demographics:    Tiffany Manning, is a 78 y.o. female, DOB - 01/18/1941, UYQ:[id number]  Admit date - 04/12/2019   Admitting Physician Anselm Jungling, DO  Outpatient Primary MD for the patient is Jaclyn Shaggy, MD  LOS - 7  Outpatient Specialists:  Chief Complaint  Patient presents with   Nausea   Wound Infection       Brief Narrative   78 year old female with hypertension, 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 fib not on anticoagulant due to hematuria with recent hospitalizations, diabetes mellitus, hypertension and hyperlipidemia hospitalized on 12/5 with diarrhea, nausea and vomiting with poor p.o. intake and acute encephalopathy. Since her recent discharge she was not felt to be back to her baseline with persistent nausea vomiting and intermittent diarrhea with progressive weakness and not able to participate with PT. Patient was found to be in septic shock with ATN.  Patient initially admitted to ICU but despite adequate hydration and antibiotics symptoms did not improve and after discussion with family was transitioned to comfort measures.  Seen by nephrology for progressive ATN and deemed not a candidate for dialysis.    Subjective:   No overnight events. Pain well controlled   Assessment  & Plan :   Principal problem Severe sepsis with septic shock Suspect urinary source and possible aspiration pneumonia. antibiotic now discontinued given worsening prognosis and goal for comfort.  Family involved in care and agree on comfort measures only.  Awaiting bed at residential hospice.  Palliative care consult appreciated. No blood draws, antibiotic or fluids.  IV morphine as needed  for pain control, IV Ativan as needed for anxiety, Haldol as needed for agitation.  Added Robinul to help with secretions. Continue Foley for comfort.   Active Problems:   Atrial fibrillation with RVR (HCC)   Hypercholesteremia   Leg wound, left   DM (diabetes mellitus), type 2 with renal complications (HCC)   AKI (acute kidney injury) (HCC)   Metabolic acidosis Cellulitis of leg   Goals of care, counseling/discussion     Code Status : DNR, comfort measures  Family  Communication  : Husband  at bedside  Disposition Plan  : Residential hospice    Consults  : PCCM, ID, palliative care   DVT Prophylaxis  : Comfort measures  Lab Results  Component Value Date   PLT 121 (L) 04/15/2019    Antibiotics  :    Anti-infectives (From admission, onward)   Start     Dose/Rate Route Frequency Ordered Stop   04/14/19 0200  vancomycin (VANCOCIN) 500 mg in sodium chloride 0.9 % 100 mL IVPB     500 mg 100 mL/hr over 60 Minutes Intravenous  Once 04/14/19 0114 04/14/19 0433   04/13/19 0600  ceFEPIme (MAXIPIME) 2 g in sodium chloride 0.9 % 100 mL IVPB  Status:  Discontinued     2 g 200 mL/hr over 30 Minutes Intravenous Every 24 hours 04/13/19 0203 04/16/19 1509   04/13/19 0230  metroNIDAZOLE (FLAGYL) IVPB 500 mg  Status:  Discontinued     500 mg 100 mL/hr over 60 Minutes Intravenous Every 8 hours 04/13/19 0201 04/15/19 1103   04/13/19 0203  vancomycin variable dose per unstable renal function (pharmacist dosing)  Status:  Discontinued      Does not apply See admin instructions 04/13/19 0203 04/15/19 1101   04/12/19 2315  vancomycin (VANCOCIN) 1,250 mg in sodium chloride 0.9 % 250 mL IVPB     1,250 mg 166.7 mL/hr over 90 Minutes Intravenous  Once 04/12/19 2306 04/13/19 0047   04/12/19 2145  piperacillin-tazobactam (ZOSYN) IVPB 3.375 g     3.375 g 100 mL/hr over 30 Minutes Intravenous  Once 04/12/19 2136 04/12/19 2241        Objective:   Vitals:   04/16/19 1657 04/17/19 0536  04/18/19 0507 04/19/19 0435  BP: 113/61 139/75 135/63 119/71  Pulse: 76 80 69 83  Resp:    18  Temp:  98.8 F (37.1 C) 98.6 F (37 C) 97.9 F (36.6 C)  TempSrc:  Axillary Oral Oral  SpO2: 100% 100% 100% 100%  Weight:      Height:        Wt Readings from Last 3 Encounters:  04/12/19 59 kg  03/28/19 59 kg  12/26/18 61.2 kg     Intake/Output Summary (Last 24 hours) at 04/19/2019 1545 Last data filed at 04/19/2019 0450 Gross per 24 hour  Intake --  Output 1150 ml  Net -1150 ml   Physical exam  no acute distress  chest: clear  CVS: NS1 S2    Data Review:    CBC Recent Labs  Lab 04/12/19 2137 04/13/19 0422 04/14/19 0430 04/15/19 0338  WBC 16.1* 20.0* 18.6* 10.1  HGB 12.8 9.2* 9.4* 7.9*  HCT 41.8 30.9* 30.7* 25.4*  PLT 292 218 170 121*  MCV 113.3* 114.9* 110.8* 108.5*  MCH 34.7* 34.2* 33.9 33.8  MCHC 30.6 29.8* 30.6 31.1  RDW 14.6 14.6 15.4 15.5  LYMPHSABS 2.1  --   --   --   MONOABS 0.4  --   --   --   EOSABS 0.0  --   --   --   BASOSABS 0.0  --   --   --     Chemistries  Recent Labs  Lab 04/12/19 2137 04/13/19 0422 04/14/19 0430 04/15/19 0338  NA 140 142 144 143  K 4.6 3.6 3.2* 2.9*  CL 107 111 104 101  CO2 <7* <7* 14* 23  GLUCOSE 216* 369* 247* 176*  BUN 52* 44* 44* 40*  CREATININE 3.48* 2.90* 3.32* 2.81*  CALCIUM  8.3* 6.2* 6.4* 6.0*  MG  --   --  1.7 1.9  AST 31  --   --   --   ALT 38  --   --   --   ALKPHOS 92  --   --   --   BILITOT 1.0  --   --   --    ------------------------------------------------------------------------------------------------------------------ No results for input(s): CHOL, HDL, LDLCALC, TRIG, CHOLHDL, LDLDIRECT in the last 72 hours.  Lab Results  Component Value Date   HGBA1C 6.4 (H) 03/26/2019   ------------------------------------------------------------------------------------------------------------------ No results for input(s): TSH, T4TOTAL, T3FREE, THYROIDAB in the last 72 hours.  Invalid input(s):  FREET3 ------------------------------------------------------------------------------------------------------------------ No results for input(s): VITAMINB12, FOLATE, FERRITIN, TIBC, IRON, RETICCTPCT in the last 72 hours.  Coagulation profile Recent Labs  Lab 04/12/19 2137  INR 1.1    No results for input(s): DDIMER in the last 72 hours.  Cardiac Enzymes No results for input(s): CKMB, TROPONINI, MYOGLOBIN in the last 168 hours.  Invalid input(s): CK ------------------------------------------------------------------------------------------------------------------ No results found for: BNP  Inpatient Medications  Scheduled Meds:  Chlorhexidine Gluconate Cloth  6 each Topical Daily   LORazepam  1 mg Intravenous QHS   mouth rinse  15 mL Mouth Rinse q12n4p   Continuous Infusions: PRN Meds:.acetaminophen, glycopyrrolate, haloperidol lactate, LORazepam, morphine injection  Micro Results Recent Results (from the past 240 hour(s))  SARS CORONAVIRUS 2 (TAT 6-24 HRS) Nasopharyngeal Nasopharyngeal Swab     Status: None   Collection Time: 04/12/19  9:30 PM   Specimen: Nasopharyngeal Swab  Result Value Ref Range Status   SARS Coronavirus 2 NEGATIVE NEGATIVE Final    Comment: (NOTE) SARS-CoV-2 target nucleic acids are NOT DETECTED. The SARS-CoV-2 RNA is generally detectable in upper and lower respiratory specimens during the acute phase of infection. Negative results do not preclude SARS-CoV-2 infection, do not rule out co-infections with other pathogens, and should not be used as the sole basis for treatment or other patient management decisions. Negative results must be combined with clinical observations, patient history, and epidemiological information. The expected result is Negative. Fact Sheet for Patients: HairSlick.no Fact Sheet for Healthcare Providers: quierodirigir.com This test is not yet approved or cleared by  the [place] FDA and  has been authorized for detection and/or diagnosis of SARS-CoV-2 by FDA under an Emergency Use Authorization (EUA). This EUA will remain  in effect (meaning this test can be used) for the duration of the COVID-19 declaration under Section 56 4(b)(1) of the Act, 21 U.S.C. section 360bbb-3(b)(1), unless the authorization is terminated or revoked sooner. Performed at [facility], [address] [place], [place] [address]   Blood Culture (routine x 2)     Status: None   Collection Time: 04/12/19  9:38 PM   Specimen: BLOOD  Result Value Ref Range Status   Specimen Description BLOOD Blood Culture adequate volume  Final   Special Requests   Final    BOTTLES DRAWN AEROBIC AND ANAEROBIC LEFT ANTECUBITAL   Culture   Final    NO GROWTH 5 DAYS Performed at [facility], [address] [place], [place] [address]    Report Status 04/17/2019 FINAL  Final  Blood Culture (routine x 2)     Status: None   Collection Time: 04/12/19  9:38 PM   Specimen: BLOOD  Result Value Ref Range Status   Specimen Description BLOOD Blood Culture adequate volume  Final   Special Requests   Final    BOTTLES DRAWN AEROBIC  AND ANAEROBIC RIGHT ANTECUBITAL   Culture   Final    NO GROWTH 5 DAYS Performed at [facility], [address] [place], [place] [address]    Report Status 04/17/2019 FINAL  Final  MRSA PCR Screening     Status: None   Collection Time: 04/13/19  6:11 AM   Specimen: Nasopharyngeal  Result Value Ref Range Status   MRSA by PCR NEGATIVE NEGATIVE Final    Comment:        The GeneXpert MRSA Assay (FDA approved for NASAL specimens only), is one component of a comprehensive MRSA colonization surveillance program. It is not intended to diagnose MRSA infection nor to guide or monitor treatment for MRSA infections. Performed at [facility], [address] [place], [place] [address]   Urine Culture     Status: Abnormal    Collection Time: 04/13/19  4:57 PM   Specimen: Urine, Catheterized  Result Value Ref Range Status   Specimen Description   Final    URINE, CATHETERIZED Performed at [facility], [address], [place], [place] [address]    Special Requests   Final    Normal Performed at [facility], [address] [place], [place] [address]    Culture MULTIPLE SPECIES PRESENT, SUGGEST RECOLLECTION (A)  Final   Report Status 04/15/2019 FINAL  Final    Radiology Reports DG Chest Port 1 View  Result Date: 04/13/2019 CLINICAL DATA:  78 year old female with lung disease. EXAM: PORTABLE CHEST 1 VIEW COMPARISON:  Chest radiograph dated 02/05/2018. FINDINGS: Shallow inspiration with minimal bibasilar atelectasis. No focal consolidation, pleural effusion, or pneumothorax. A 2 cm nodular density in the left upper lobe was seen on the prior radiographs and corresponds to the nodule seen on the neck CT dated 05/28/2017. Top-normal cardiac silhouette. Atherosclerotic calcification of the aortic arch. No acute osseous pathology. IMPRESSION: 1. No acute cardiopulmonary process. 2. A 2 cm nodular density in the left upper lobe present dating back to 05/28/2017. Electronically Signed   By: Anner Crete M.D.   On: 04/13/2019 18:52   DG Abdomen Acute W/Chest  Result Date: 04/12/2019 CLINICAL DATA:  Nausea vomiting EXAM: DG ABDOMEN ACUTE W/ 1V CHEST COMPARISON:  None. FINDINGS: There is no evidence of dilated bowel loops or free intraperitoneal air. No radiopaque calculi or other significant radiographic abnormality is seen. There is mild cardiomegaly. No large airspace consolidation. Minimal nonspecific patchy airspace opacity seen within the right mid lung. IMPRESSION: Negative abdominal radiographs. Minimal patchy airspace opacity seen within the right mid lung, which is non-specific could be due scarring and/or atelectasis. Electronically Signed   By: Prudencio Pair M.D.   On: 04/12/2019 21:44   CT  RENAL STONE STUDY  Result Date: 03/24/2019 CLINICAL DATA:  Flank pain EXAM: CT ABDOMEN AND PELVIS WITHOUT CONTRAST TECHNIQUE: Multidetector CT imaging of the abdomen and pelvis was performed following the standard protocol without IV contrast. COMPARISON:  Ultrasound 12/24/2018, CT 01/28/2018, 09/19/2011 FINDINGS: Lower chest: Stable nodularity in the right middle lobe. 5 mm right middle lobe pulmonary nodule, decreased compared to prior measurement of 7 mm. No acute consolidation or effusion. Coronary vascular calcification. Heart size within normal limits. Small hiatal hernia. Hepatobiliary: No focal hepatic abnormality. Status post cholecystectomy. No significant biliary dilatation Pancreas: No acute inflammatory changes. Pancreas is atrophic and contains a few parenchymal calcifications. Fifteen mm hypodense nodule adjacent to or exophytic to the pancreas body, stable to slight increase in size. Spleen: Normal in size without focal abnormality.  Adrenals/Urinary Tract: Adrenal glands are normal. Lobulated atrophic left kidney. Moderate left and mild right hydronephrosis, decreased compared to prior study. Left hydroureter with stranding about the distal left ureter. No stones. Mild increased density within the posterior bladder. Small cyst lower pole left kidney. Stomach/Bowel: The stomach is nonenlarged. No dilated small bowel. Collapsed appearance of transverse colon. Possible wall thickening of the ascending colon. Vascular/Lymphatic: Moderate aortic atherosclerosis without aneurysm. No significantly enlarged lymph nodes. Reproductive: Status post hysterectomy. No adnexal masses. Other: Negative for free air or free fluid. Moderate fat containing infraumbilical hernia. Musculoskeletal: No acute or suspicious osseous abnormality. Similar scattered foci of sclerosis within the hips and pelvis. IMPRESSION: 1. Moderate left and mild right hydronephrosis with left hydroureter, though decreased compared to the  prior CT. No ureteral stone is seen. Left ureter is dilated down to the level of the bladder. Cortical scarring and atrophy of the left kidney. 2. Mild increased density at the posterior bladder, possible small stones. 3. 15 mm hypodense nodule possibly arising from the pancreatic body. Nonemergent MRI recommended for further evaluation given increasing size as compared with prior exams. 4. 5 mm right middle lobe pulmonary nodule, decreased in size 5. Possible wall thickening of the ascending colon though without significant surrounding inflammatory change. Correlation with colonoscopy suggested to exclude mass in the region. 6. Moderate fat containing infraumbilical ventral hernia Electronically Signed   By: Jasmine PangKim  Fujinaga M.D.   On: 03/24/2019 21:31   US EKG SITE RITE  Result Date: 04/14/2019 If Site Rite image not attached, placement could not be confirmed due to current cardiac rhythm.   Time Spent in minutes 15   Terin Dierolf M.D on 04/19/2019 at 3:45 PM  Between 7am to 7pm - Pager - [phone]  After 7pm go to [email or web address] - password [email or web address]  [facility] -  Office  [phone]

## 2019-04-20 NOTE — Progress Notes (Signed)
PROGRESS NOTE                                                                                                                                                                                                             Patient Demographics:    Tiffany Manning, is a 78 y.o. female, DOB - 11/09/1940, NWG:[id number]  Admit date - 04/12/2019   Admitting Physician Anselm Jungling, DO  Outpatient Primary MD for the patient is Jaclyn Shaggy, MD  LOS - 8  Outpatient Specialists:  Chief Complaint  Patient presents with  . Nausea  . 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 fib not on anticoagulant due to hematuria with recent hospitalizations, diabetes mellitus, hypertension and hyperlipidemia hospitalized on 12/5 with diarrhea, nausea and vomiting with poor p.o. intake and acute encephalopathy. Since her recent discharge she was not felt to be back to her baseline with persistent nausea vomiting and intermittent diarrhea with progressive weakness and not able to participate with PT. Patient was found to be in septic shock with ATN.  Patient initially admitted to ICU but despite adequate hydration and antibiotics symptoms did not improve and after discussion with family was transitioned to comfort measures.  Seen by nephrology for progressive ATN and deemed not a candidate for dialysis.    Subjective:   Patient sleeping comfortably.  No issues.   Assessment  & Plan :   Principal problem Severe sepsis with septic shock Suspect urinary source and possible aspiration pneumonia. antibiotic now discontinued given worsening prognosis and goal for comfort.  Family involved in care and agree on comfort measures only.  Awaiting bed at residential hospice.  Palliative care consult appreciated.   IV morphine as needed for pain control, IV Ativan as needed  for anxiety, Haldol as needed for agitation.  Robinul to help with secretions. Continue Foley for comfort.   Active Problems:   Atrial fibrillation with RVR (HCC)   Hypercholesteremia   Leg wound, left   DM (diabetes mellitus), type 2 with renal complications (HCC)   AKI (acute kidney injury) (HCC)   Metabolic acidosis Cellulitis of leg   Goals of care, counseling/discussion     Code Status : DNR, comfort measures  Family Communication  : Husband and sons  at bedside  Disposition Plan  : Residential hospice    Consults  : PCCM, ID, palliative care   DVT Prophylaxis  : Comfort measures  Lab Results  Component Value Date   PLT 121 (L) 04/15/2019    Antibiotics  :    Anti-infectives (From admission, onward)   Start     Dose/Rate Route Frequency Ordered Stop   04/14/19 0200  vancomycin (VANCOCIN) 500 mg in sodium chloride 0.9 % 100 mL IVPB     500 mg 100 mL/hr over 60 Minutes Intravenous  Once 04/14/19 0114 04/14/19 0433   04/13/19 0600  ceFEPIme (MAXIPIME) 2 g in sodium chloride 0.9 % 100 mL IVPB  Status:  Discontinued     2 g 200 mL/hr over 30 Minutes Intravenous Every 24 hours 04/13/19 0203 04/16/19 1509   04/13/19 0230  metroNIDAZOLE (FLAGYL) IVPB 500 mg  Status:  Discontinued     500 mg 100 mL/hr over 60 Minutes Intravenous Every 8 hours 04/13/19 0201 04/15/19 1103   04/13/19 0203  vancomycin variable dose per unstable renal function (pharmacist dosing)  Status:  Discontinued      Does not apply See admin instructions 04/13/19 0203 04/15/19 1101   04/12/19 2315  vancomycin (VANCOCIN) 1,250 mg in sodium chloride 0.9 % 250 mL IVPB     1,250 mg 166.7 mL/hr over 90 Minutes Intravenous  Once 04/12/19 2306 04/13/19 0047   04/12/19 2145  piperacillin-tazobactam (ZOSYN) IVPB 3.375 g     3.375 g 100 mL/hr over 30 Minutes Intravenous  Once 04/12/19 2136 04/12/19 2241        Objective:   Vitals:   04/17/19 0536 04/18/19 0507 04/19/19 0435 04/20/19 0313  BP: 139/75  135/63 119/71 130/69  Pulse: 80 69 83 93  Resp:   18 16  Temp: 98.8 F (37.1 C) 98.6 F (37 C) 97.9 F (36.6 C) 98.2 F (36.8 C)  TempSrc: Axillary Oral Oral Oral  SpO2: 100% 100% 100% 100%  Weight:      Height:        Wt Readings from Last 3 Encounters:  04/12/19 59 kg  03/28/19 59 kg  12/26/18 61.2 kg     Intake/Output Summary (Last 24 hours) at 04/20/2019 1428 Last data filed at 04/20/2019 0315 Gross per 24 hour  Intake --  Output 600 ml  Net -600 ml   Physical exam Sleeping comfortably Chest: Clear   Data Review:    CBC Recent Labs  Lab 04/14/19 0430 04/15/19 0338  WBC 18.6* 10.1  HGB 9.4* 7.9*  HCT 30.7* 25.4*  PLT 170 121*  MCV 110.8* 108.5*  MCH 33.9 33.8  MCHC 30.6 31.1  RDW 15.4 15.5    Chemistries  Recent Labs  Lab 04/14/19 0430 04/15/19 0338  NA 144 143  K 3.2* 2.9*  CL 104 101  CO2 14* 23  GLUCOSE 247* 176*  BUN 44* 40*  CREATININE 3.32* 2.81*  CALCIUM 6.4* 6.0*  MG 1.7 1.9   ------------------------------------------------------------------------------------------------------------------ No results for input(s): CHOL, HDL, LDLCALC, TRIG, CHOLHDL, LDLDIRECT in the last 72 hours.  Lab Results  Component Value Date   HGBA1C 6.4 (H) 03/26/2019   ------------------------------------------------------------------------------------------------------------------ No results for input(s): TSH, T4TOTAL, T3FREE, THYROIDAB in the last 72 hours.  Invalid input(s): FREET3 ------------------------------------------------------------------------------------------------------------------ No results for input(s): VITAMINB12, FOLATE, FERRITIN, TIBC, IRON, RETICCTPCT in the last 72 hours.  Coagulation profile No results for input(s): INR, PROTIME in the last 168 hours.  No results for input(s): DDIMER in the last  72 hours.  Cardiac Enzymes No results for input(s): CKMB, TROPONINI, MYOGLOBIN in the last 168 hours.  Invalid input(s):  CK ------------------------------------------------------------------------------------------------------------------ No results found for: BNP  Inpatient Medications  Scheduled Meds: . Chlorhexidine Gluconate Cloth  6 each Topical Daily  . LORazepam  1 mg Intravenous QHS  . mouth rinse  15 mL Mouth Rinse q12n4p   Continuous Infusions: PRN Meds:.acetaminophen, glycopyrrolate, haloperidol lactate, LORazepam, morphine injection  Micro Results Recent Results (from the past 240 hour(s))  SARS CORONAVIRUS 2 (TAT 6-24 HRS) Nasopharyngeal Nasopharyngeal Swab     Status: None   Collection Time: 04/12/19  9:30 PM   Specimen: Nasopharyngeal Swab  Result Value Ref Range Status   SARS Coronavirus 2 NEGATIVE NEGATIVE Final    Comment: (NOTE) SARS-CoV-2 target nucleic acids are NOT DETECTED. The SARS-CoV-2 RNA is generally detectable in upper and lower respiratory specimens during the acute phase of infection. Negative results do not preclude SARS-CoV-2 infection, do not rule out co-infections with other pathogens, and should not be used as the sole basis for treatment or other patient management decisions. Negative results must be combined with clinical observations, patient history, and epidemiological information. The expected result is Negative. Fact Sheet for Patients: HairSlick.nohttps://www.fda.gov/media/138098/download Fact Sheet for Healthcare Providers: quierodirigir.comhttps://www.fda.gov/media/138095/download This test is not yet approved or cleared by the [place] FDA and  has been authorized for detection and/or diagnosis of SARS-CoV-2 by FDA under an Emergency Use Authorization (EUA). This EUA will remain  in effect (meaning this test can be used) for the duration of the COVID-19 declaration under Section 56 4(b)(1) of the Act, 21 U.S.C. section 360bbb-3(b)(1), unless the authorization is terminated or revoked sooner. Performed at [facility], [address] [place], [place] [address]    Blood Culture (routine x 2)     Status: None   Collection Time: 04/12/19  9:38 PM   Specimen: BLOOD  Result Value Ref Range Status   Specimen Description BLOOD Blood Culture adequate volume  Final   Special Requests   Final    BOTTLES DRAWN AEROBIC AND ANAEROBIC LEFT ANTECUBITAL   Culture   Final    NO GROWTH 5 DAYS Performed at [facility], [address] [place], [place] [address]    Report Status 04/17/2019 FINAL  Final  Blood Culture (routine x 2)     Status: None   Collection Time: 04/12/19  9:38 PM   Specimen: BLOOD  Result Value Ref Range Status   Specimen Description BLOOD Blood Culture adequate volume  Final   Special Requests   Final    BOTTLES DRAWN AEROBIC AND ANAEROBIC RIGHT ANTECUBITAL   Culture   Final    NO GROWTH 5 DAYS Performed at [facility], [address] [place], [place] [address]    Report Status 04/17/2019 FINAL  Final  MRSA PCR Screening     Status: None   Collection Time: 04/13/19  6:11 AM   Specimen: Nasopharyngeal  Result Value Ref Range Status   MRSA by PCR NEGATIVE NEGATIVE Final    Comment:        The GeneXpert MRSA Assay (FDA approved for NASAL specimens only), is one component of a comprehensive MRSA colonization surveillance program. It is not intended to diagnose MRSA infection nor to guide or monitor treatment for MRSA infections. Performed at [facility], [address] [place], [place] [address]   Urine Culture     Status: Abnormal   Collection Time: 04/13/19  4:57 PM  Specimen: Urine, Catheterized  Result Value Ref Range Status   Specimen Description   Final    URINE, CATHETERIZED Performed at [facility], [address] [place], [place] [address]    Special Requests   Final    Normal Performed at [facility], [address] [place], [place] [address]    Culture MULTIPLE SPECIES PRESENT, SUGGEST RECOLLECTION (A)  Final   Report Status 04/15/2019 FINAL  Final     Radiology Reports DG Chest Port 1 View  Result Date: 04/13/2019 CLINICAL DATA:  78 year old female with lung disease. EXAM: PORTABLE CHEST 1 VIEW COMPARISON:  Chest radiograph dated 02/05/2018. FINDINGS: Shallow inspiration with minimal bibasilar atelectasis. No focal consolidation, pleural effusion, or pneumothorax. A 2 cm nodular density in the left upper lobe was seen on the prior radiographs and corresponds to the nodule seen on the neck CT dated 05/28/2017. Top-normal cardiac silhouette. Atherosclerotic calcification of the aortic arch. No acute osseous pathology. IMPRESSION: 1. No acute cardiopulmonary process. 2. A 2 cm nodular density in the left upper lobe present dating back to 05/28/2017. Electronically Signed   By: Elgie Collard M.D.   On: 04/13/2019 18:52   DG Abdomen Acute W/Chest  Result Date: 04/12/2019 CLINICAL DATA:  Nausea vomiting EXAM: DG ABDOMEN ACUTE W/ 1V CHEST COMPARISON:  None. FINDINGS: There is no evidence of dilated bowel loops or free intraperitoneal air. No radiopaque calculi or other significant radiographic abnormality is seen. There is mild cardiomegaly. No large airspace consolidation. Minimal nonspecific patchy airspace opacity seen within the right mid lung. IMPRESSION: Negative abdominal radiographs. Minimal patchy airspace opacity seen within the right mid lung, which is non-specific could be due scarring and/or atelectasis. Electronically Signed   By: Jonna Clark M.D.   On: 04/12/2019 21:44   CT RENAL STONE STUDY  Result Date: 03/24/2019 CLINICAL DATA:  Flank pain EXAM: CT ABDOMEN AND PELVIS WITHOUT CONTRAST TECHNIQUE: Multidetector CT imaging of the abdomen and pelvis was performed following the standard protocol without IV contrast. COMPARISON:  Ultrasound 12/24/2018, CT 01/28/2018, 09/19/2011 FINDINGS: Lower chest: Stable nodularity in the right middle lobe. 5 mm right middle lobe pulmonary nodule, decreased compared to prior measurement of 7 mm. No  acute consolidation or effusion. Coronary vascular calcification. Heart size within normal limits. Small hiatal hernia. Hepatobiliary: No focal hepatic abnormality. Status post cholecystectomy. No significant biliary dilatation Pancreas: No acute inflammatory changes. Pancreas is atrophic and contains a few parenchymal calcifications. Fifteen mm hypodense nodule adjacent to or exophytic to the pancreas body, stable to slight increase in size. Spleen: Normal in size without focal abnormality. Adrenals/Urinary Tract: Adrenal glands are normal. Lobulated atrophic left kidney. Moderate left and mild right hydronephrosis, decreased compared to prior study. Left hydroureter with stranding about the distal left ureter. No stones. Mild increased density within the posterior bladder. Small cyst lower pole left kidney. Stomach/Bowel: The stomach is nonenlarged. No dilated small bowel. Collapsed appearance of transverse colon. Possible wall thickening of the ascending colon. Vascular/Lymphatic: Moderate aortic atherosclerosis without aneurysm. No significantly enlarged lymph nodes. Reproductive: Status post hysterectomy. No adnexal masses. Other: Negative for free air or free fluid. Moderate fat containing infraumbilical hernia. Musculoskeletal: No acute or suspicious osseous abnormality. Similar scattered foci of sclerosis within the hips and pelvis. IMPRESSION: 1. Moderate left and mild right hydronephrosis with left hydroureter, though decreased compared to the prior CT. No ureteral stone is seen. Left ureter is dilated down to the level of the bladder. Cortical scarring and atrophy of the  left kidney. 2. Mild increased density at the posterior bladder, possible small stones. 3. 15 mm hypodense nodule possibly arising from the pancreatic body. Nonemergent MRI recommended for further evaluation given increasing size as compared with prior exams. 4. 5 mm right middle lobe pulmonary nodule, decreased in size 5. Possible wall  thickening of the ascending colon though without significant surrounding inflammatory change. Correlation with colonoscopy suggested to exclude mass in the region. 6. Moderate fat containing infraumbilical ventral hernia Electronically Signed   By: Jasmine Pang M.D.   On: 03/24/2019 21:31   [place] EKG SITE RITE  Result Date: 04/14/2019 If Site Rite image not attached, placement could not be confirmed due to current cardiac rhythm.   Time Spent in minutes 15   Fannie Alomar M.D on 04/20/2019 at 2:28 PM  Between 7am to 7pm - Pager - [phone]  After 7pm go to [email or web address] - password [email or web address]  [facility] -  Office  [phone]

## 2019-04-21 ENCOUNTER — Ambulatory Visit: Payer: Medicare HMO | Admitting: Urology

## 2019-04-21 DIAGNOSIS — Z515 Encounter for palliative care: Secondary | ICD-10-CM

## 2019-04-21 NOTE — Progress Notes (Signed)
Daily Progress Note   Patient Name: Tiffany Manning       Date: 04/21/2019 DOB: 1940-09-11  Age: 78 y.o. MRN#: [id number] Attending Physician: Louellen Molder, MD Primary Care Physician: Albina Billet, MD Admit Date: 04/12/2019  Reason for Consultation/Follow-up: Psychosocial/spiritual support and Terminal Care  Subjective: Patient is resting in bed with eyes closed. She appears to have no distress at this time. Respirations are even and unlabored. Husband is at bedside. He states she has called out for her sister several times.  Husband states their children have been come to see her, and some have returned home to [place]. He requests for her sister to be able to visit. Spoke with charge RN and A/C. They will allow a 1 time change in visitors. Husband made aware and is greatful.   Length of Stay: 9  Current Medications: Scheduled Meds:  . Chlorhexidine Gluconate Cloth  6 each Topical Daily  . LORazepam  1 mg Intravenous QHS  . mouth rinse  15 mL Mouth Rinse q12n4p    Continuous Infusions:   PRN Meds: acetaminophen, glycopyrrolate, haloperidol lactate, LORazepam, morphine injection  Physical Exam Constitutional:      Comments: Eyes closed.   Pulmonary:     Effort: Pulmonary effort is normal.             Vital Signs: BP 115/67 (BP Location: Right Arm)   Pulse 98   Temp 98.7 F (37.1 C) (Oral)   Resp 16   Ht 5' (1.524 m)   Wt 59 kg   SpO2 97%   BMI 25.40 kg/m  SpO2: SpO2: 97 % O2 Device: O2 Device: Nasal Cannula O2 Flow Rate: O2 Flow Rate (L/min): 3 L/min  Intake/output summary: No intake or output data in the 24 hours ending 04/21/19 1227 LBM: Last BM Date: 04/20/19 Baseline Weight: Weight: 59 kg Most recent weight: Weight: 59 kg       Palliative  Assessment/Data:10%    Flowsheet Rows     Most Recent Value  Intake Tab  Referral Department  Critical care  Unit at Time of Referral  ER  Palliative Care Primary Diagnosis  Sepsis/Infectious Disease  Date Notified  04/14/19  Palliative Care Type  New Palliative care  Reason for referral  Clarify Goals of Care  Date of Admission  04/13/19  Date first seen by Palliative Care  04/15/19  # of days Palliative referral response time  1 Day(s)  # of days IP prior to Palliative referral  1  Clinical Assessment  Psychosocial & Spiritual Assessment  Palliative Care Outcomes      Patient Active Problem List   Diagnosis Date Noted  . Goals of care, counseling/discussion   . Palliative care encounter   . Cellulitis 04/13/2019  . Septic shock (HCC) 04/13/2019  . Sepsis (HCC) 04/12/2019  . Malnutrition of moderate degree 03/27/2019  . Metabolic acidosis   . Hydronephrosis   . ARF (acute renal failure) (HCC) 03/24/2019  . Lower urinary tract infectious disease 03/24/2019  . DM (diabetes mellitus), type 2 with renal complications (HCC) 03/24/2019  . AKI (acute kidney injury) (HCC)   . Atrial fibrillation with RVR (HCC)   . Severe sepsis (HCC) 01/27/2018  . Pressure injury of skin 01/27/2018  . Diabetic acidosis without coma (HCC)   . Leg wound, left 07/10/2017  . Diabetes (HCC) 06/18/2017  . Hypercholesteremia 06/18/2017  . Hypertension 06/18/2017  . Neck abscess 06/18/2017  . Protein-calorie malnutrition, severe 05/29/2017  . Acute renal failure (HCC) 05/28/2017  . Acute renal failure (ARF) (HCC) 05/28/2017  . Sialolithiasis of submandibular gland 11/20/2013    Palliative Care Assessment & Plan    Recommendations/Plan:  Patient is awaiting hospice facility placement.   Visitor change to allow sister to come to bedside.   Patient appears comfortable. No change to medication regimen at this time.    Code Status:    Code Status Orders  (From admission, onward)          Start     Ordered   04/14/19 1713  Do not attempt resuscitation (DNR)  Continuous    Question Answer Comment  In the event of cardiac or respiratory ARREST Do not call a "code blue"   In the event of cardiac or respiratory ARREST Do not perform Intubation, CPR, defibrillation or ACLS   In the event of cardiac or respiratory ARREST Use medication by any route, position, wound care, and other measures to relive pain and suffering. May use oxygen, suction and manual treatment of airway obstruction as needed for comfort.   Comments No life support of nay kind per pts. husband      04/14/19 1712        Code Status History    Date Active Date Inactive Code Status Order ID Comments User Context   04/13/2019 1156 04/14/2019 1712 Partial Code [id number]  Enedina Finner, MD Inpatient   04/12/2019 2323 04/13/2019 1156 Full Code [id number]  Anselm Jungling, DO ED   03/24/2019 2157 03/28/2019 1509 DNR [id number]  Eduard Clos, MD ED   01/27/2018 1122 02/06/2018 1801 Full Code [id number]  Houston Siren, MD Inpatient   05/28/2017 1637 06/06/2017 2002 Full Code [id number]  Altamese Dilling, MD ED   Advance Care Planning Activity       Prognosis:   < 2 weeks NO PO intake.   Discharge Planning:  Hospice facility  Care plan was discussed with charge RN and [facility].   Thank you for allowing the Palliative Medicine Team to assist in the care of this patient.   Total Time 35 min Prolonged Time Billed no       Greater than 50%  of this time was spent counseling and coordinating care related to the above assessment and plan.  Morton Stall, NP  Please contact Palliative Medicine Team phone at  [phone] for questions and concerns.

## 2019-04-21 NOTE — Progress Notes (Signed)
Visits made through out the day to check on patient/family and provide emotional support. Husband [name] at bedside all day. Daughter [name] in at end of day. Patient continues to require IV morphine and lorazepam for symptom management. Patient noted with periods of apnea, education provided to family regarding signs and symptoms of end of life. Hopeful for a bed at the hospice home tomorrow. Family and CMRN Doran Clay made aware. Will continue to follow. Flo Shanks BSN, RN, [place] [phone]

## 2019-04-21 NOTE — Progress Notes (Signed)
PROGRESS NOTE                                                                                                                                                                                                             Patient Demographics:    Tiffany Manning, is a 78 y.o. female, DOB - [birth year], KXF:[id number]  Admit date - 04/12/2019   Admitting Physician Orene Desanctis, DO  Outpatient Primary MD for the patient is Albina Billet, MD  LOS - 9  Outpatient Specialists:  Chief Complaint  Patient presents with  . Nausea  . 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 fib not on anticoagulant due to hematuria with recent hospitalizations, diabetes mellitus, hypertension and hyperlipidemia hospitalized on 12/5 with diarrhea, nausea and vomiting with poor p.o. intake and acute encephalopathy. Since her recent discharge she was not felt to be back to her baseline with persistent nausea vomiting and intermittent diarrhea with progressive weakness and not able to participate with PT. Patient was found to be in septic shock with ATN.  Patient initially admitted to ICU but despite adequate hydration and antibiotics symptoms did not improve and after discussion with family was transitioned to comfort measures.  Seen by nephrology for progressive ATN and deemed not a candidate for dialysis.    Subjective:  Patient sleeping.  Tolerating pain meds.   Assessment  & Plan :   Principal problem Severe sepsis with septic shock Suspect urinary source and possible aspiration pneumonia.  Now on comfort measures.  Awaiting bed at residential hospice.  Palliative care consult appreciated.   IV morphine as needed for pain control, IV Ativan as needed for anxiety, Haldol as needed for agitation.  Robinul to help with secretions. Continue Foley for comfort.   Active Problems:   Atrial fibrillation with RVR (HCC)   Hypercholesteremia   Leg wound, left   DM (diabetes mellitus), type 2 with renal complications (HCC)   AKI (acute kidney injury) ([place])   Metabolic acidosis Cellulitis of leg   Goals of care, counseling/discussion     Code Status : DNR, comfort measures  Family Communication  : Husband at bedside.  Disposition Plan  : Awaiting residential hospice.    Consults  : PCCM, ID, palliative care  DVT Prophylaxis  : Comfort measures  Lab Results  Component Value Date   PLT 121 (L) 04/15/2019    Antibiotics  :    Anti-infectives (From admission, onward)   Start     Dose/Rate Route Frequency Ordered Stop   04/14/19 0200  vancomycin (VANCOCIN) 500 mg in sodium chloride 0.9 % 100 mL IVPB     500 mg 100 mL/hr over 60 Minutes Intravenous  Once 04/14/19 0114 04/14/19 0433   04/13/19 0600  ceFEPIme (MAXIPIME) 2 g in sodium chloride 0.9 % 100 mL IVPB  Status:  Discontinued     2 g 200 mL/hr over 30 Minutes Intravenous Every 24 hours 04/13/19 0203 04/16/19 1509   04/13/19 0230  metroNIDAZOLE (FLAGYL) IVPB 500 mg  Status:  Discontinued     500 mg 100 mL/hr over 60 Minutes Intravenous Every 8 hours 04/13/19 0201 04/15/19 1103   04/13/19 0203  vancomycin variable dose per unstable renal function (pharmacist dosing)  Status:  Discontinued      Does not apply See admin instructions 04/13/19 0203 04/15/19 1101   04/12/19 2315  vancomycin (VANCOCIN) 1,250 mg in sodium chloride 0.9 % 250 mL IVPB     1,250 mg 166.7 mL/hr over 90 Minutes Intravenous  Once 04/12/19 2306 04/13/19 0047   04/12/19 2145  piperacillin-tazobactam (ZOSYN) IVPB 3.375 g     3.375 g 100 mL/hr over 30 Minutes Intravenous  Once 04/12/19 2136 04/12/19 2241        Objective:   Vitals:   04/18/19 0507 04/19/19 0435 04/20/19 0313 04/21/19 0452  BP: 135/63 119/71 130/69 115/67  Pulse: 69 83 93 98  Resp:  18 16 16   Temp: 98.6 F (37 C) 97.9 F (36.6 C) 98.2 F  (36.8 C) 98.7 F (37.1 C)  TempSrc: Oral Oral Oral Oral  SpO2: 100% 100% 100% 97%  Weight:      Height:        Wt Readings from Last 3 Encounters:  04/12/19 59 kg  03/28/19 59 kg  12/26/18 61.2 kg    No intake or output data in the 24 hours ending 04/21/19 1551 Physical exam Sleeping comfortably Chest: Clear   Data Review:    CBC Recent Labs  Lab 04/15/19 0338  WBC 10.1  HGB 7.9*  HCT 25.4*  PLT 121*  MCV 108.5*  MCH 33.8  MCHC 31.1  RDW 15.5    Chemistries  Recent Labs  Lab 04/15/19 0338  NA 143  K 2.9*  CL 101  CO2 23  GLUCOSE 176*  BUN 40*  CREATININE 2.81*  CALCIUM 6.0*  MG 1.9   ------------------------------------------------------------------------------------------------------------------ No results for input(s): CHOL, HDL, LDLCALC, TRIG, CHOLHDL, LDLDIRECT in the last 72 hours.  Lab Results  Component Value Date   HGBA1C 6.4 (H) 03/26/2019   ------------------------------------------------------------------------------------------------------------------ No results for input(s): TSH, T4TOTAL, T3FREE, THYROIDAB in the last 72 hours.  Invalid input(s): FREET3 ------------------------------------------------------------------------------------------------------------------ No results for input(s): VITAMINB12, FOLATE, FERRITIN, TIBC, IRON, RETICCTPCT in the last 72 hours.  Coagulation profile No results for input(s): INR, PROTIME in the last 168 hours.  No results for input(s): DDIMER in the last 72 hours.  Cardiac Enzymes No results for input(s): CKMB, TROPONINI, MYOGLOBIN in the last 168 hours.  Invalid input(s): CK ------------------------------------------------------------------------------------------------------------------ No results found for: BNP  Inpatient Medications  Scheduled Meds: . Chlorhexidine Gluconate Cloth  6 each Topical Daily  . LORazepam  1 mg Intravenous QHS  . mouth rinse  15 mL Mouth Rinse q12n4p  Continuous Infusions: PRN Meds:.acetaminophen, glycopyrrolate, haloperidol lactate, LORazepam, morphine injection  Micro Results Recent Results (from the past 240 hour(s))  SARS CORONAVIRUS 2 (TAT 6-24 HRS) Nasopharyngeal Nasopharyngeal Swab     Status: None   Collection Time: 04/12/19  9:30 PM   Specimen: Nasopharyngeal Swab  Result Value Ref Range Status   SARS Coronavirus 2 NEGATIVE NEGATIVE Final    Comment: (NOTE) SARS-CoV-2 target nucleic acids are NOT DETECTED. The SARS-CoV-2 RNA is generally detectable in upper and lower respiratory specimens during the acute phase of infection. Negative results do not preclude SARS-CoV-2 infection, do not rule out co-infections with other pathogens, and should not be used as the sole basis for treatment or other patient management decisions. Negative results must be combined with clinical observations, patient history, and epidemiological information. The expected result is Negative. Fact Sheet for Patients: HairSlick.no Fact Sheet for Healthcare Providers: quierodirigir.com This test is not yet approved or cleared by the [place] FDA and  has been authorized for detection and/or diagnosis of SARS-CoV-2 by FDA under an Emergency Use Authorization (EUA). This EUA will remain  in effect (meaning this test can be used) for the duration of the COVID-19 declaration under Section 56 4(b)(1) of the Act, 21 U.S.C. section 360bbb-3(b)(1), unless the authorization is terminated or revoked sooner. Performed at [facility], [address] [place], [place] [address]   Blood Culture (routine x 2)     Status: None   Collection Time: 04/12/19  9:38 PM   Specimen: BLOOD  Result Value Ref Range Status   Specimen Description BLOOD Blood Culture adequate volume  Final   Special Requests   Final    BOTTLES DRAWN AEROBIC AND ANAEROBIC LEFT ANTECUBITAL   Culture   Final    NO GROWTH 5  DAYS Performed at [facility], [address] [place], [place] [address]    Report Status 04/17/2019 FINAL  Final  Blood Culture (routine x 2)     Status: None   Collection Time: 04/12/19  9:38 PM   Specimen: BLOOD  Result Value Ref Range Status   Specimen Description BLOOD Blood Culture adequate volume  Final   Special Requests   Final    BOTTLES DRAWN AEROBIC AND ANAEROBIC RIGHT ANTECUBITAL   Culture   Final    NO GROWTH 5 DAYS Performed at [facility], [address], [place], [place] [address]    Report Status 04/17/2019 FINAL  Final  MRSA PCR Screening     Status: None   Collection Time: 04/13/19  6:11 AM   Specimen: Nasopharyngeal  Result Value Ref Range Status   MRSA by PCR NEGATIVE NEGATIVE Final    Comment:        The GeneXpert MRSA Assay (FDA approved for NASAL specimens only), is one component of a comprehensive MRSA colonization surveillance program. It is not intended to diagnose MRSA infection nor to guide or monitor treatment for MRSA infections. Performed at [facility], [address], [place], [place] [address]   Urine Culture     Status: Abnormal   Collection Time: 04/13/19  4:57 PM   Specimen: Urine, Catheterized  Result Value Ref Range Status   Specimen Description   Final    URINE, CATHETERIZED Performed at [facility], [address], [place], [place] [address]    Special Requests   Final    Normal Performed at [facility], [address] [place], [place] [address]    Culture  MULTIPLE SPECIES PRESENT, SUGGEST RECOLLECTION (A)  Final   Report Status 04/15/2019 FINAL  Final    Radiology Reports DG Chest Port 1 View  Result Date: 04/13/2019 CLINICAL DATA:  78 year old female with lung disease. EXAM: PORTABLE CHEST 1 VIEW COMPARISON:  Chest radiograph dated 02/05/2018. FINDINGS: Shallow inspiration with minimal bibasilar atelectasis. No focal consolidation, pleural effusion, or  pneumothorax. A 2 cm nodular density in the left upper lobe was seen on the prior radiographs and corresponds to the nodule seen on the neck CT dated 05/28/2017. Top-normal cardiac silhouette. Atherosclerotic calcification of the aortic arch. No acute osseous pathology. IMPRESSION: 1. No acute cardiopulmonary process. 2. A 2 cm nodular density in the left upper lobe present dating back to 05/28/2017. Electronically Signed   By: Elgie Collard M.D.   On: 04/13/2019 18:52   DG Abdomen Acute W/Chest  Result Date: 04/12/2019 CLINICAL DATA:  Nausea vomiting EXAM: DG ABDOMEN ACUTE W/ 1V CHEST COMPARISON:  None. FINDINGS: There is no evidence of dilated bowel loops or free intraperitoneal air. No radiopaque calculi or other significant radiographic abnormality is seen. There is mild cardiomegaly. No large airspace consolidation. Minimal nonspecific patchy airspace opacity seen within the right mid lung. IMPRESSION: Negative abdominal radiographs. Minimal patchy airspace opacity seen within the right mid lung, which is non-specific could be due scarring and/or atelectasis. Electronically Signed   By: Jonna Clark M.D.   On: 04/12/2019 21:44   CT RENAL STONE STUDY  Result Date: 03/24/2019 CLINICAL DATA:  Flank pain EXAM: CT ABDOMEN AND PELVIS WITHOUT CONTRAST TECHNIQUE: Multidetector CT imaging of the abdomen and pelvis was performed following the standard protocol without IV contrast. COMPARISON:  Ultrasound 12/24/2018, CT 01/28/2018, 09/19/2011 FINDINGS: Lower chest: Stable nodularity in the right middle lobe. 5 mm right middle lobe pulmonary nodule, decreased compared to prior measurement of 7 mm. No acute consolidation or effusion. Coronary vascular calcification. Heart size within normal limits. Small hiatal hernia. Hepatobiliary: No focal hepatic abnormality. Status post cholecystectomy. No significant biliary dilatation Pancreas: No acute inflammatory changes. Pancreas is atrophic and contains a few  parenchymal calcifications. Fifteen mm hypodense nodule adjacent to or exophytic to the pancreas body, stable to slight increase in size. Spleen: Normal in size without focal abnormality. Adrenals/Urinary Tract: Adrenal glands are normal. Lobulated atrophic left kidney. Moderate left and mild right hydronephrosis, decreased compared to prior study. Left hydroureter with stranding about the distal left ureter. No stones. Mild increased density within the posterior bladder. Small cyst lower pole left kidney. Stomach/Bowel: The stomach is nonenlarged. No dilated small bowel. Collapsed appearance of transverse colon. Possible wall thickening of the ascending colon. Vascular/Lymphatic: Moderate aortic atherosclerosis without aneurysm. No significantly enlarged lymph nodes. Reproductive: Status post hysterectomy. No adnexal masses. Other: Negative for free air or free fluid. Moderate fat containing infraumbilical hernia. Musculoskeletal: No acute or suspicious osseous abnormality. Similar scattered foci of sclerosis within the hips and pelvis. IMPRESSION: 1. Moderate left and mild right hydronephrosis with left hydroureter, though decreased compared to the prior CT. No ureteral stone is seen. Left ureter is dilated down to the level of the bladder. Cortical scarring and atrophy of the left kidney. 2. Mild increased density at the posterior bladder, possible small stones. 3. 15 mm hypodense nodule possibly arising from the pancreatic body. Nonemergent MRI recommended for further evaluation given increasing size as compared with prior exams. 4. 5 mm right middle lobe pulmonary nodule, decreased in size 5. Possible wall thickening of the ascending colon though without significant surrounding  inflammatory change. Correlation with colonoscopy suggested to exclude mass in the region. 6. Moderate fat containing infraumbilical ventral hernia Electronically Signed   By: Jasmine Pang M.D.   On: 03/24/2019 21:31   [place] EKG SITE  RITE  Result Date: 04/14/2019 If Site Rite image not attached, placement could not be confirmed due to current cardiac rhythm.   Time Spent in minutes 15   Shawny Borkowski M.D on 04/21/2019 at 3:51 PM  Between 7am to 7pm - Pager - [phone]  After 7pm go to [email or web address] - password [facility]  [facility] -  Office  [phone]

## 2019-04-21 NOTE — Care Management Important Message (Signed)
Important Message  Patient Details  Name: [name] MRN: [id number] Date of Birth: 03-20-1941   Medicare Important Message Given:  Yes     [name] 04/21/2019, 3:08 PM

## 2019-04-22 DIAGNOSIS — L03119 Cellulitis of unspecified part of limb: Secondary | ICD-10-CM | POA: Diagnosis present

## 2019-04-22 MED ORDER — GLYCOPYRROLATE 0.2 MG/ML IJ SOLN: 0.4000 mg | INTRAMUSCULAR | 0 refills | Status: AC | PRN

## 2019-04-22 MED ORDER — MORPHINE SULFATE (PF) 2 MG/ML IV SOLN: 2.0000 mg | INTRAVENOUS | 0 refills | Status: AC | PRN

## 2019-04-22 NOTE — Progress Notes (Signed)
Bed is available for transfer to the hospice home today. Writer has spoken to patient's husband [name] in the room and daughter [name] via telephone, both are in agreement. Plan is for discharge to the hospice home today with EMS pick up scheduled for 5 pm. Hospital care team updated. Discharge summary faxed to referral. Patient continues with longer periods of apnea, minimal reaction to mouth care and limb movement. Writer has assured MR. Colantonio that patient will NOT transfer if she appears unstable.  Will continue to follow through discharge. Flo Shanks BSN, RN, [place] [phone]

## 2019-04-22 NOTE — TOC Transition Note (Signed)
Transition of Care [facility]) - CM/SW Discharge Note   Patient Details  Name: Tiffany Manning MRN: [id number] Date of Birth: October 12, 1940  [facility] CM/SW Contact:  Shelbie Hutching, RN Phone Number: 04/22/2019, 12:36 PM   Clinical Narrative:    Patient will discharge to residential hospice at the [facility] this afternoon.  Husband is at the bedside.  Patient will transport via [employer] cabin crew.     Final next level of care: [place] Barriers to Discharge: Barriers Resolved   Patient Goals and CMS Choice        Discharge Placement              Patient chooses bed at: [facility]) Patient to be transferred to facility by: [facility] Name of family member notified: [name]- Husband Patient and family notified of of transfer: 04/22/19  Discharge Plan and Services                                     Social Determinants of Health (SDOH) Interventions     Readmission Risk Interventions No flowsheet data found.

## 2019-04-22 NOTE — Discharge Summary (Signed)
Physician Discharge Summary  Tiffany Manning ZOX:[id number] DOB: 02-24-1941 DOA: 04/12/2019  PCP: Jaclyn Shaggy, MD  Admit date: 04/12/2019 Discharge date: 04/22/2019  Admitted From: Home Disposition: Residential hospice  Recommendations for Outpatient Follow-up:  1. Discharge to residential hospice   Discharge Condition: Hospice CODE STATUS: DNR, comfort care Diet recommendation: Comfort feed    Discharge Diagnoses:  Principal Problem:   Severe sepsis with Septic shock (HCC)  Active Problems:   Atrial fibrillation with RVR (HCC)   Hypercholesteremia   Leg wound, left   DM (diabetes mellitus), type 2 with renal complications (HCC)   AKI (acute kidney injury) (HCC)   Metabolic acidosis   Sepsis (HCC)   Cellulitis   Goals of care, counseling/discussion   Palliative care encounter   Cellulitis of leg  Brief narrative/HPI 78 year old female with hypertension, 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 fib not on anticoagulant due to hematuria with recent hospitalizations, diabetes mellitus, hypertension and hyperlipidemia hospitalized on 12/5 with diarrhea, nausea and vomiting with poor p.o. intake and acute encephalopathy. Since her recent discharge she was not felt to be back to her baseline with persistent nausea vomiting and intermittent diarrhea with progressive weakness and not able to participate with PT. Patient was found to be in septic shock with ATN.  Patient initially admitted to ICU but despite adequate hydration and antibiotics symptoms did not improve and after discussion with family was transitioned to comfort measures.  Seen by nephrology for progressive ATN and deemed not a candidate for dialysis.  Hospital course   Principal problem Severe sepsis with septic shock Suspect urinary source and possible aspiration pneumonia.  Now on comfort measures.    Patient will be discharged to disease with hospice.  Palliative care consult appreciated.  IV morphine as needed for pain control, glycopyrrolate to help with secretions. Continue Foley for comfort.   Active Problems:   Atrial fibrillation with RVR (HCC)   Hypercholesteremia   Leg wound, left   DM (diabetes mellitus), type 2 with renal complications (HCC)   AKI (acute kidney injury) (HCC)   Metabolic acidosis Cellulitis of leg   Goals of care, counseling/discussion   Family communication: Husband, sons and sister involved in care  Consult: Palliative care, critical care, nephrology   Discharge Instructions   Allergies as of 04/22/2019      Reactions   Sulfa Antibiotics Nausea And Vomiting      Medication List    STOP taking these medications   acidophilus Caps capsule   ascorbic acid 250 MG tablet Commonly known as: VITAMIN C   feeding supplement (PRO-STAT SUGAR FREE 64) Liqd   glipiZIDE 5 MG tablet Commonly known as: GLUCOTROL   metoprolol tartrate 50 MG tablet Commonly known as: LOPRESSOR   multivitamin with minerals Tabs tablet   ranitidine 150 MG capsule Commonly known as: ZANTAC   simvastatin 40 MG tablet Commonly known as: ZOCOR   tamsulosin 0.4 MG Caps capsule Commonly known as: FLOMAX   triamterene-hydrochlorothiazide 37.5-25 MG capsule Commonly known as: DYAZIDE   trimethoprim 100 MG tablet Commonly known as: TRIMPEX     TAKE these medications   acetaminophen 325 MG tablet Commonly known as: TYLENOL Take 2 tablets (650 mg total) by mouth every 6 (six) hours as needed for mild pain (or Fever >/= 101).   glycopyrrolate 0.2 MG/ML injection Commonly known as: ROBINUL Inject 2 mLs (0.4 mg total) into the vein every 4 (four) hours as needed (secretions, gurgling).   morphine 2 MG/ML injection  Inject 1-2 mLs (2-4 mg total) into the vein every 2 (two) hours as needed (or discomfort).      Follow-up Information    residential hospice Follow up.          Allergies  Allergen Reactions  . Sulfa Antibiotics Nausea And  Vomiting      Procedures/Studies: DG Chest Port 1 View  Result Date: 04/13/2019 CLINICAL DATA:  78 year old female with lung disease. EXAM: PORTABLE CHEST 1 VIEW COMPARISON:  Chest radiograph dated 02/05/2018. FINDINGS: Shallow inspiration with minimal bibasilar atelectasis. No focal consolidation, pleural effusion, or pneumothorax. A 2 cm nodular density in the left upper lobe was seen on the prior radiographs and corresponds to the nodule seen on the neck CT dated 05/28/2017. Top-normal cardiac silhouette. Atherosclerotic calcification of the aortic arch. No acute osseous pathology. IMPRESSION: 1. No acute cardiopulmonary process. 2. A 2 cm nodular density in the left upper lobe present dating back to 05/28/2017. Electronically Signed   By: Elgie Collard M.D.   On: 04/13/2019 18:52   DG Abdomen Acute W/Chest  Result Date: 04/12/2019 CLINICAL DATA:  Nausea vomiting EXAM: DG ABDOMEN ACUTE W/ 1V CHEST COMPARISON:  None. FINDINGS: There is no evidence of dilated bowel loops or free intraperitoneal air. No radiopaque calculi or other significant radiographic abnormality is seen. There is mild cardiomegaly. No large airspace consolidation. Minimal nonspecific patchy airspace opacity seen within the right mid lung. IMPRESSION: Negative abdominal radiographs. Minimal patchy airspace opacity seen within the right mid lung, which is non-specific could be due scarring and/or atelectasis. Electronically Signed   By: Jonna Clark M.D.   On: 04/12/2019 21:44   CT RENAL STONE STUDY  Result Date: 03/24/2019 CLINICAL DATA:  Flank pain EXAM: CT ABDOMEN AND PELVIS WITHOUT CONTRAST TECHNIQUE: Multidetector CT imaging of the abdomen and pelvis was performed following the standard protocol without IV contrast. COMPARISON:  Ultrasound 12/24/2018, CT 01/28/2018, 09/19/2011 FINDINGS: Lower chest: Stable nodularity in the right middle lobe. 5 mm right middle lobe pulmonary nodule, decreased compared to prior measurement  of 7 mm. No acute consolidation or effusion. Coronary vascular calcification. Heart size within normal limits. Small hiatal hernia. Hepatobiliary: No focal hepatic abnormality. Status post cholecystectomy. No significant biliary dilatation Pancreas: No acute inflammatory changes. Pancreas is atrophic and contains a few parenchymal calcifications. Fifteen mm hypodense nodule adjacent to or exophytic to the pancreas body, stable to slight increase in size. Spleen: Normal in size without focal abnormality. Adrenals/Urinary Tract: Adrenal glands are normal. Lobulated atrophic left kidney. Moderate left and mild right hydronephrosis, decreased compared to prior study. Left hydroureter with stranding about the distal left ureter. No stones. Mild increased density within the posterior bladder. Small cyst lower pole left kidney. Stomach/Bowel: The stomach is nonenlarged. No dilated small bowel. Collapsed appearance of transverse colon. Possible wall thickening of the ascending colon. Vascular/Lymphatic: Moderate aortic atherosclerosis without aneurysm. No significantly enlarged lymph nodes. Reproductive: Status post hysterectomy. No adnexal masses. Other: Negative for free air or free fluid. Moderate fat containing infraumbilical hernia. Musculoskeletal: No acute or suspicious osseous abnormality. Similar scattered foci of sclerosis within the hips and pelvis. IMPRESSION: 1. Moderate left and mild right hydronephrosis with left hydroureter, though decreased compared to the prior CT. No ureteral stone is seen. Left ureter is dilated down to the level of the bladder. Cortical scarring and atrophy of the left kidney. 2. Mild increased density at the posterior bladder, possible small stones. 3. 15 mm hypodense nodule possibly arising from the pancreatic body. Nonemergent  MRI recommended for further evaluation given increasing size as compared with prior exams. 4. 5 mm right middle lobe pulmonary nodule, decreased in size 5.  Possible wall thickening of the ascending colon though without significant surrounding inflammatory change. Correlation with colonoscopy suggested to exclude mass in the region. 6. Moderate fat containing infraumbilical ventral hernia Electronically Signed   By: Jasmine PangKim  Fujinaga M.D.   On: 03/24/2019 21:31   US EKG SITE RITE  Result Date: 04/14/2019 If Site Rite image not attached, placement could not be confirmed due to current cardiac rhythm.      Subjective: Patient resting comfortably, pain controlled  Discharge Exam: Vitals:   04/21/19 0452 04/22/19 0503  BP: 115/67 (!) 127/59  Pulse: 98 (!) 102  Resp: 16 15  Temp: 98.7 F (37.1 C) 98.5 F (36.9 C)  SpO2: 97% 100%   Vitals:   04/19/19 0435 04/20/19 0313 04/21/19 0452 04/22/19 0503  BP: 119/71 130/69 115/67 (!) 127/59  Pulse: 83 93 98 (!) 102  Resp: 18 16 16 15   Temp: 97.9 F (36.6 C) 98.2 F (36.8 C) 98.7 F (37.1 C) 98.5 F (36.9 C)  TempSrc: Oral Oral Oral Axillary  SpO2: 100% 100% 97% 100%  Weight:      Height:        General: Somnolent, arousable to command HEENT: Dry mucosa, supple neck Chest: Clear CVs: Normal S1-S2 GI: Soft, nondistended nontender Musculoskeletal: Warm, no edema    The results of significant diagnostics from this hospitalization (including imaging, microbiology, ancillary and laboratory) are listed below for reference.     Microbiology: Recent Results (from the past 240 hour(s))  SARS CORONAVIRUS 2 (TAT 6-24 HRS) Nasopharyngeal Nasopharyngeal Swab     Status: None   Collection Time: 04/12/19  9:30 PM   Specimen: Nasopharyngeal Swab  Result Value Ref Range Status   SARS Coronavirus 2 NEGATIVE NEGATIVE Final    Comment: (NOTE) SARS-CoV-2 target nucleic acids are NOT DETECTED. The SARS-CoV-2 RNA is generally detectable in upper and lower respiratory specimens during the acute phase of infection. Negative results do not preclude SARS-CoV-2 infection, do not rule out co-infections  with other pathogens, and should not be used as the sole basis for treatment or other patient management decisions. Negative results must be combined with clinical observations, patient history, and epidemiological information. The expected result is Negative. Fact Sheet for Patients: HairSlick.nohttps://www.fda.gov/media/138098/download Fact Sheet for Healthcare Providers: quierodirigir.comhttps://www.fda.gov/media/138095/download This test is not yet approved or cleared by the [place] FDA and  has been authorized for detection and/or diagnosis of SARS-CoV-2 by FDA under an Emergency Use Authorization (EUA). This EUA will remain  in effect (meaning this test can be used) for the duration of the COVID-19 declaration under Section 56 4(b)(1) of the Act, 21 U.S.C. section 360bbb-3(b)(1), unless the authorization is terminated or revoked sooner. Performed at [facility], [address] [place], [place] [address]   Blood Culture (routine x 2)     Status: None   Collection Time: 04/12/19  9:38 PM   Specimen: BLOOD  Result Value Ref Range Status   Specimen Description BLOOD Blood Culture adequate volume  Final   Special Requests   Final    BOTTLES DRAWN AEROBIC AND ANAEROBIC LEFT ANTECUBITAL   Culture   Final    NO GROWTH 5 DAYS Performed at [facility], [address] [place], [place] [address]    Report Status 04/17/2019 FINAL  Final  Blood Culture (routine x 2)  Status: None   Collection Time: 04/12/19  9:38 PM   Specimen: BLOOD  Result Value Ref Range Status   Specimen Description BLOOD Blood Culture adequate volume  Final   Special Requests   Final    BOTTLES DRAWN AEROBIC AND ANAEROBIC RIGHT ANTECUBITAL   Culture   Final    NO GROWTH 5 DAYS Performed at [facility], [address] [place], [place] [address]    Report Status 04/17/2019 FINAL  Final  MRSA PCR Screening     Status: None   Collection Time: 04/13/19  6:11 AM   Specimen: Nasopharyngeal  Result  Value Ref Range Status   MRSA by PCR NEGATIVE NEGATIVE Final    Comment:        The GeneXpert MRSA Assay (FDA approved for NASAL specimens only), is one component of a comprehensive MRSA colonization surveillance program. It is not intended to diagnose MRSA infection nor to guide or monitor treatment for MRSA infections. Performed at [facility], [address] [place], [place] [address]   Urine Culture     Status: Abnormal   Collection Time: 04/13/19  4:57 PM   Specimen: Urine, Catheterized  Result Value Ref Range Status   Specimen Description   Final    URINE, CATHETERIZED Performed at [facility], [address], [place], [place] [address]    Special Requests   Final    Normal Performed at [facility] [address] [place], [place] [address]    Culture MULTIPLE SPECIES PRESENT, SUGGEST RECOLLECTION (A)  Final   Report Status 04/15/2019 FINAL  Final     Labs: BNP (last 3 results) No results for input(s): BNP in the last 8760 hours. Basic Metabolic Panel: No results for input(s): NA, K, CL, CO2, GLUCOSE, BUN, CREATININE, CALCIUM, MG, PHOS in the last 168 hours. Liver Function Tests: No results for input(s): AST, ALT, ALKPHOS, BILITOT, PROT, ALBUMIN in the last 168 hours. No results for input(s): LIPASE, AMYLASE in the last 168 hours. No results for input(s): AMMONIA in the last 168 hours. CBC: No results for input(s): WBC, NEUTROABS, HGB, HCT, MCV, PLT in the last 168 hours. Cardiac Enzymes: No results for input(s): CKTOTAL, CKMB, CKMBINDEX, TROPONINI in the last 168 hours. BNP: Invalid input(s): POCBNP CBG: Recent Labs  Lab 04/16/19 0848  GLUCAP 183*   D-Dimer No results for input(s): DDIMER in the last 72 hours. Hgb A1c No results for input(s): HGBA1C in the last 72 hours. Lipid Profile No results for input(s): CHOL, HDL, LDLCALC, TRIG, CHOLHDL, LDLDIRECT in the last 72 hours. Thyroid function studies No results for  input(s): TSH, T4TOTAL, T3FREE, THYROIDAB in the last 72 hours.  Invalid input(s): FREET3 Anemia work up No results for input(s): VITAMINB12, FOLATE, FERRITIN, TIBC, IRON, RETICCTPCT in the last 72 hours. Urinalysis    Component Value Date/Time   COLORURINE YELLOW (A) 04/13/2019 0000   APPEARANCEUR TURBID (A) 04/13/2019 0000   APPEARANCEUR Cloudy 10/18/2011 1616   LABSPEC 1.024 04/13/2019 0000   LABSPEC 1.009 10/18/2011 1616   PHURINE 5.0 04/13/2019 0000   GLUCOSEU NEGATIVE 04/13/2019 0000   GLUCOSEU Negative 10/18/2011 1616   HGBUR LARGE (A) 04/13/2019 0000   BILIRUBINUR NEGATIVE 04/13/2019 0000   BILIRUBINUR Negative 10/18/2011 1616   KETONESUR NEGATIVE 04/13/2019 0000   PROTEINUR 100 (A) 04/13/2019 0000   NITRITE NEGATIVE 04/13/2019 0000   LEUKOCYTESUR LARGE (A) 04/13/2019 0000   LEUKOCYTESUR 2+ 10/18/2011 1616   Sepsis Labs Invalid input(s): PROCALCITONIN,  WBC,  LACTICIDVEN Microbiology Recent Results (from the past 240 hour(s))  SARS CORONAVIRUS 2 (TAT 6-24 HRS) Nasopharyngeal Nasopharyngeal Swab     Status: None   Collection Time: 04/12/19  9:30 PM   Specimen: Nasopharyngeal Swab  Result Value Ref Range Status   SARS Coronavirus 2 NEGATIVE NEGATIVE Final    Comment: (NOTE) SARS-CoV-2 target nucleic acids are NOT DETECTED. The SARS-CoV-2 RNA is generally detectable in upper and lower respiratory specimens during the acute phase of infection. Negative results do not preclude SARS-CoV-2 infection, do not rule out co-infections with other pathogens, and should not be used as the sole basis for treatment or other patient management decisions. Negative results must be combined with clinical observations, patient history, and epidemiological information. The expected result is Negative. Fact Sheet for Patients: HairSlick.no Fact Sheet for Healthcare Providers: quierodirigir.com This test is not yet approved or  cleared by the [place] FDA and  has been authorized for detection and/or diagnosis of SARS-CoV-2 by FDA under an Emergency Use Authorization (EUA). This EUA will remain  in effect (meaning this test can be used) for the duration of the COVID-19 declaration under Section 56 4(b)(1) of the Act, 21 U.S.C. section 360bbb-3(b)(1), unless the authorization is terminated or revoked sooner. Performed at [facility], [address], [place], [place] [address]   Blood Culture (routine x 2)     Status: None   Collection Time: 04/12/19  9:38 PM   Specimen: BLOOD  Result Value Ref Range Status   Specimen Description BLOOD Blood Culture adequate volume  Final   Special Requests   Final    BOTTLES DRAWN AEROBIC AND ANAEROBIC LEFT ANTECUBITAL   Culture   Final    NO GROWTH 5 DAYS Performed at [facility], [address], [place], [place] [address]    Report Status 04/17/2019 FINAL  Final  Blood Culture (routine x 2)     Status: None   Collection Time: 04/12/19  9:38 PM   Specimen: BLOOD  Result Value Ref Range Status   Specimen Description BLOOD Blood Culture adequate volume  Final   Special Requests   Final    BOTTLES DRAWN AEROBIC AND ANAEROBIC RIGHT ANTECUBITAL   Culture   Final    NO GROWTH 5 DAYS Performed at [facility], [address] [place], [place] [address]    Report Status 04/17/2019 FINAL  Final  MRSA PCR Screening     Status: None   Collection Time: 04/13/19  6:11 AM   Specimen: Nasopharyngeal  Result Value Ref Range Status   MRSA by PCR NEGATIVE NEGATIVE Final    Comment:        The GeneXpert MRSA Assay (FDA approved for NASAL specimens only), is one component of a comprehensive MRSA colonization surveillance program. It is not intended to diagnose MRSA infection nor to guide or monitor treatment for MRSA infections. Performed at [facility], [address], [place], [place] [address]   Urine Culture     Status: Abnormal    Collection Time: 04/13/19  4:57 PM   Specimen: Urine, Catheterized  Result Value Ref Range Status   Specimen Description   Final    URINE, CATHETERIZED Performed at [facility], [address] [place], [place] [address]    Special Requests   Final    Normal Performed at [facility], [address] [place], [place] [address]    Culture MULTIPLE SPECIES PRESENT, SUGGEST RECOLLECTION (A)  Final  Report Status 04/15/2019 FINAL  Final     Time coordinating discharge: 35 minutes  SIGNED:   Louellen Molder, MD  [facility] 04/22/2019, 12:44 PM Pager   If 7PM-7AM, please contact night-coverage [email or web address] Password [email or web address]

## 2019-04-22 NOTE — Progress Notes (Signed)
   04/22/19 1600  Clinical Encounter Type  Visited With Patient and family together  Visit Type Follow-up;Spiritual support  Ch followed up with the Husband Nel. Husband reminisced his time with his life and his younger years. Ch encouraged him to continue his life review and offered a listening ear. Husband also expressed emotions related to anticipatory grief. Ch provided pastoral presence and helped him process his emotions. Ch prayed for the comfort of the patient and the strength of the husband and the family. Pt will be moved to hospice shortly. The visit was appreciated.

## 2019-05-09 DEATH — deceased

## 2019-07-23 ENCOUNTER — Ambulatory Visit: Payer: Medicare HMO | Admitting: Urology

## 2019-12-29 ENCOUNTER — Ambulatory Visit: Payer: Medicare HMO | Admitting: Urology

## 2020-05-29 IMAGING — CT CT RENAL STONE PROTOCOL
2 of 4 series · 15 of 46 positions shown, 17 images · non-contrast
Comparison: Ultrasound 12/24/2018, CT 01/28/2018, 09/19/2011

CLINICAL DATA: Flank pain

EXAM:
CT ABDOMEN AND PELVIS WITHOUT CONTRAST
TECHNIQUE: Multidetector CT imaging of the abdomen and pelvis was performed
following the standard protocol without IV contrast.

[Series 2: stone full standard · axial · 0.68mm/px · z∈[-1116,-712]mm · 12 of 91 slices shown, 14 images]
[im 5/91  soft-tissue]
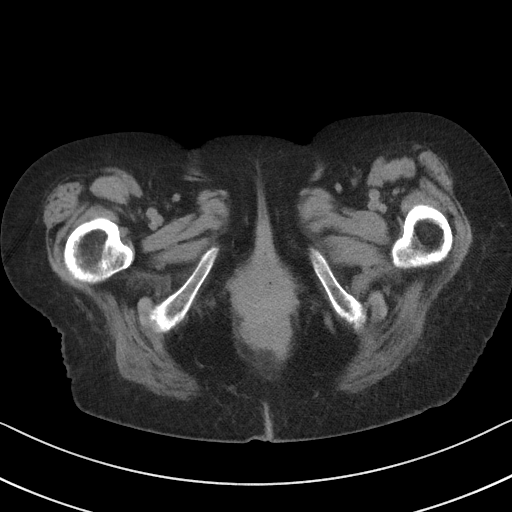
[im 5/91  bone]
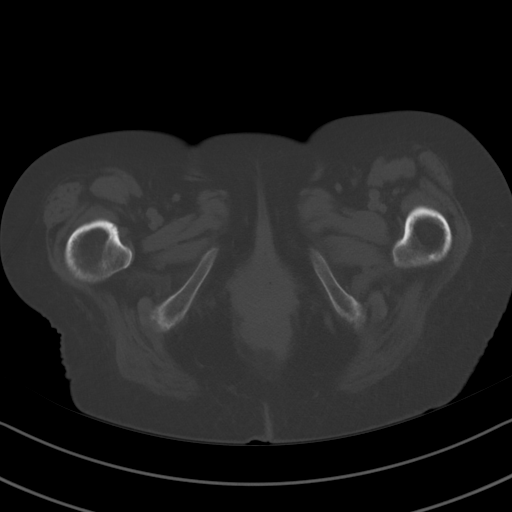
[im 13/91  soft-tissue]
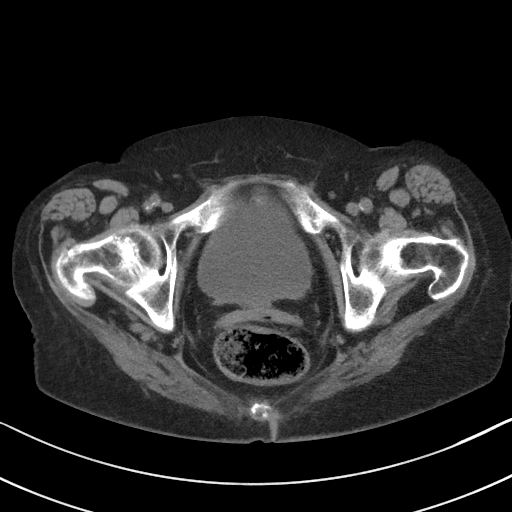
[im 21/91  soft-tissue]
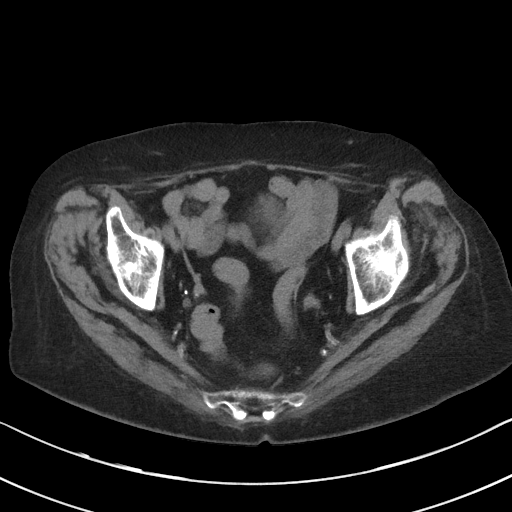
[im 29/91  soft-tissue]
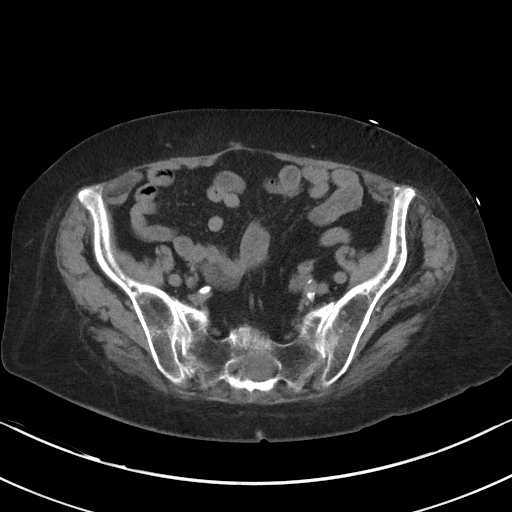
[im 33/91  soft-tissue]
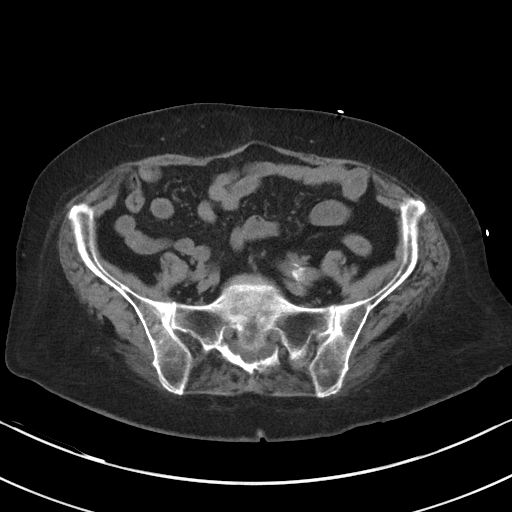
[im 41/91  soft-tissue]
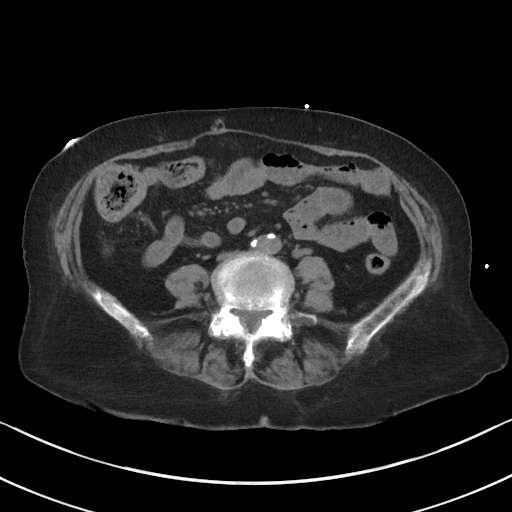
[im 50/91  soft-tissue]
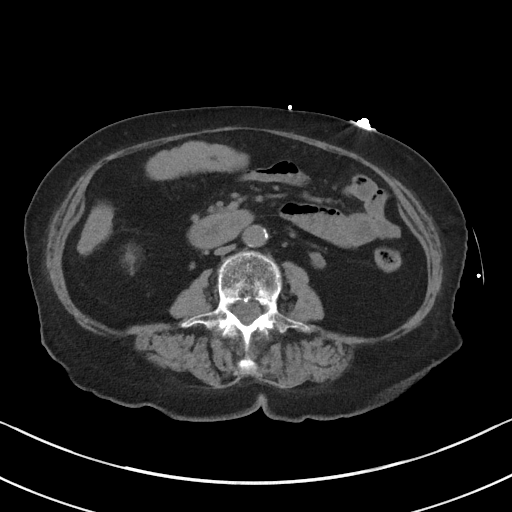
[im 58/91  soft-tissue]
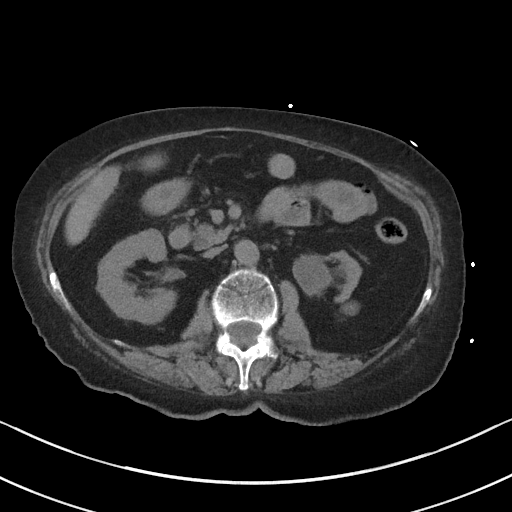
[im 62/91  soft-tissue]
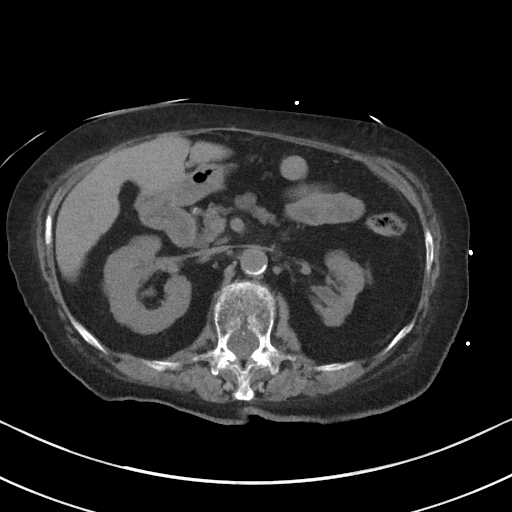
[im 62/91  bone]
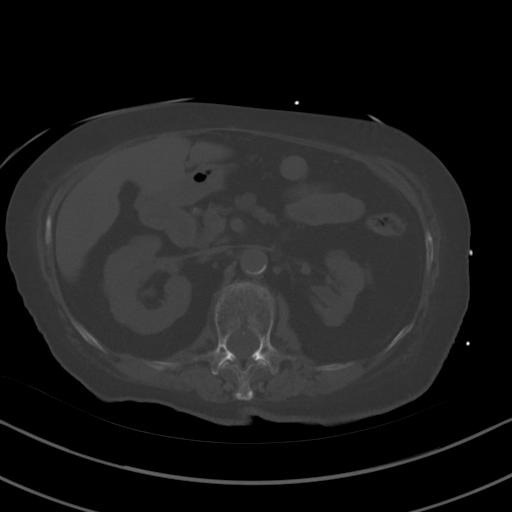
[im 70/91  soft-tissue]
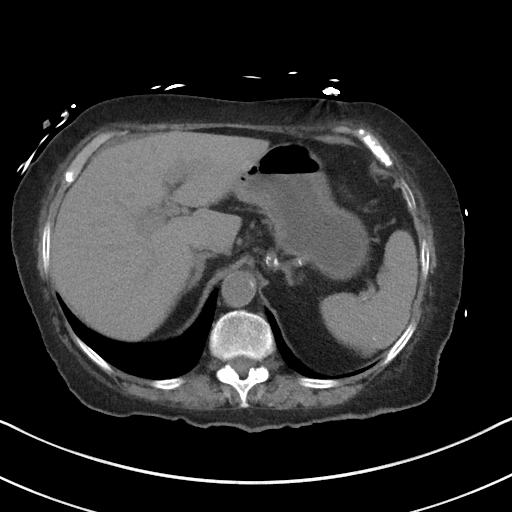
[im 78/91  soft-tissue]
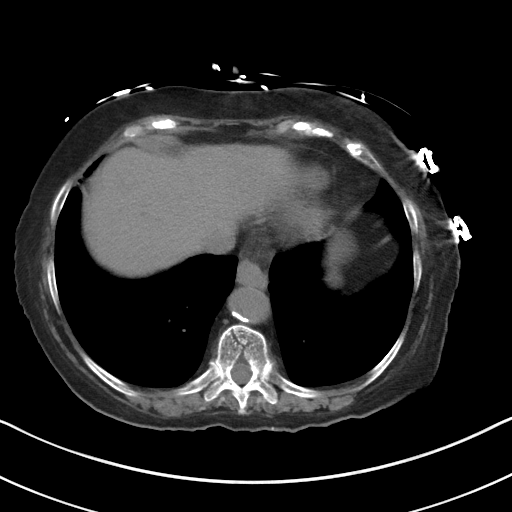
[im 86/91  soft-tissue]
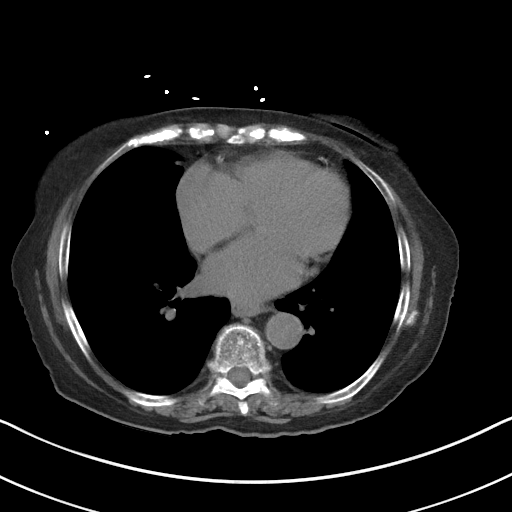

[Series 5: coronal · coronal · 0.66mm/px · 3 of 113 slices shown]
[im 38/113  soft-tissue]
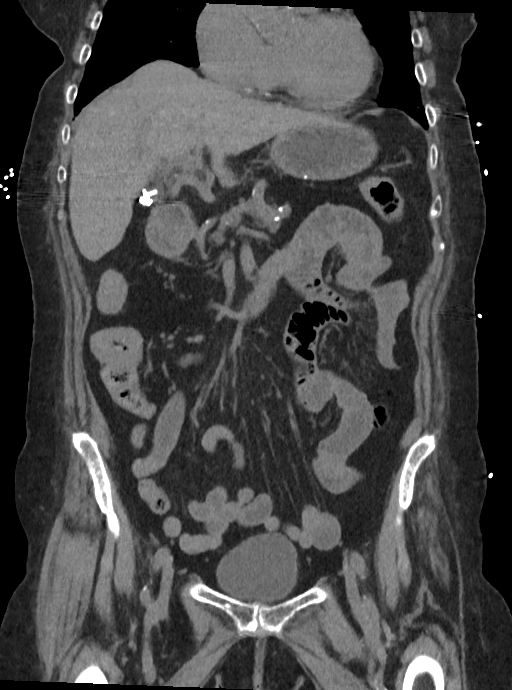
[im 50/113  soft-tissue]
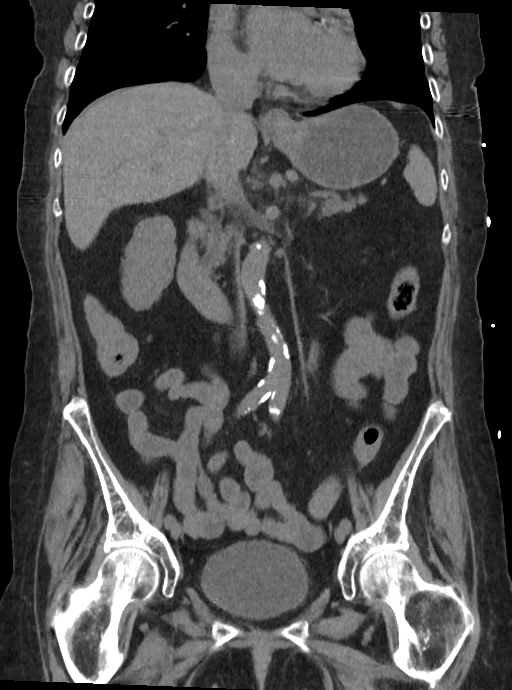
[im 63/113  soft-tissue]
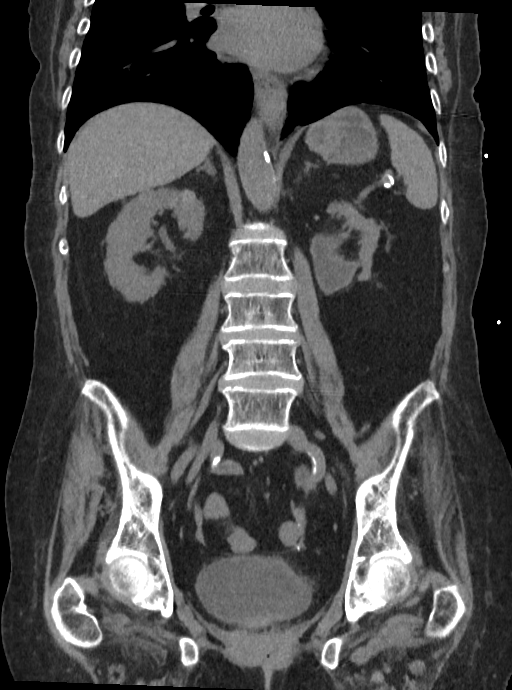

[15 of 46 positions shown; findings below may reference images not displayed]

FINDINGS: Lower chest: Stable nodularity in the right middle lobe. 5 mm right
middle lobe pulmonary nodule, decreased compared to prior
measurement of 7 mm. No acute consolidation or effusion. Coronary
vascular calcification. Heart size within normal limits. Small
hiatal hernia.

Hepatobiliary: No focal hepatic abnormality. Status post
cholecystectomy. No significant biliary dilatation

Pancreas: No acute inflammatory changes. Pancreas is atrophic and
contains a few parenchymal calcifications. Fifteen mm hypodense
nodule adjacent to or exophytic to the pancreas body, stable to
slight increase in size.

Spleen: Normal in size without focal abnormality.

Adrenals/Urinary Tract: Adrenal glands are normal. Lobulated
atrophic left kidney. Moderate left and mild right hydronephrosis,
decreased compared to prior study. Left hydroureter with stranding
about the distal left ureter. No stones. Mild increased density
within the posterior bladder. Small cyst lower pole left kidney.

Stomach/Bowel: The stomach is nonenlarged. No dilated small bowel.
Collapsed appearance of transverse colon. Possible wall thickening
of the ascending colon.

Vascular/Lymphatic: Moderate aortic atherosclerosis without
aneurysm. No significantly enlarged lymph nodes.

Reproductive: Status post hysterectomy. No adnexal masses.

Other: Negative for free air or free fluid. Moderate fat containing
infraumbilical hernia.

Musculoskeletal: No acute or suspicious osseous abnormality. Similar
scattered foci of sclerosis within the hips and pelvis.
IMPRESSION: 1. Moderate left and mild right hydronephrosis with left
hydroureter, though decreased compared to the prior CT. No ureteral
stone is seen. Left ureter is dilated down to the level of the
bladder. Cortical scarring and atrophy of the left kidney.
2. Mild increased density at the posterior bladder, possible small
stones.
3. 15 mm hypodense nodule possibly arising from the pancreatic body.
Nonemergent MRI recommended for further evaluation given increasing
size as compared with prior exams.
4. 5 mm right middle lobe pulmonary nodule, decreased in size
5. Possible wall thickening of the ascending colon though without
significant surrounding inflammatory change. Correlation with
colonoscopy suggested to exclude mass in the region.
6. Moderate fat containing infraumbilical ventral hernia
